# Patient Record
Sex: Male | Born: 1950 | Race: Black or African American | Hispanic: No | Marital: Married | Smoking: Never smoker
Health system: Southern US, Community
[De-identification: ages and names within clinical notes are randomized; demographics above are authoritative.]

## PROBLEM LIST (undated history)

## (undated) DIAGNOSIS — C61 Malignant neoplasm of prostate: Secondary | ICD-10-CM

## (undated) DIAGNOSIS — I10 Essential (primary) hypertension: Secondary | ICD-10-CM

## (undated) DIAGNOSIS — Z923 Personal history of irradiation: Secondary | ICD-10-CM

## (undated) DIAGNOSIS — F419 Anxiety disorder, unspecified: Secondary | ICD-10-CM

## (undated) DIAGNOSIS — N189 Chronic kidney disease, unspecified: Secondary | ICD-10-CM

## (undated) HISTORY — DX: Personal history of irradiation: Z92.3

## (undated) HISTORY — DX: Malignant neoplasm of prostate: C61

## (undated) HISTORY — PX: FRACTURE SURGERY: SHX138

---

## 1999-07-18 ENCOUNTER — Encounter: Admission: RE | Admit: 1999-07-18 | Discharge: 1999-10-16 | Payer: Self-pay | Admitting: Family Medicine

## 2011-11-24 ENCOUNTER — Emergency Department (HOSPITAL_COMMUNITY): Payer: BC Managed Care – PPO

## 2011-11-24 ENCOUNTER — Encounter (HOSPITAL_COMMUNITY): Payer: Self-pay | Admitting: Emergency Medicine

## 2011-11-24 ENCOUNTER — Emergency Department (HOSPITAL_COMMUNITY)
Admission: EM | Admit: 2011-11-24 | Discharge: 2011-11-24 | Disposition: A | Discharge status: Home / Self Care | Payer: BC Managed Care – PPO | Attending: Emergency Medicine | Admitting: Emergency Medicine

## 2011-11-24 DIAGNOSIS — M25579 Pain in unspecified ankle and joints of unspecified foot: Secondary | ICD-10-CM | POA: Insufficient documentation

## 2011-11-24 DIAGNOSIS — M21969 Unspecified acquired deformity of unspecified lower leg: Secondary | ICD-10-CM | POA: Insufficient documentation

## 2011-11-24 DIAGNOSIS — S9000XA Contusion of unspecified ankle, initial encounter: Secondary | ICD-10-CM | POA: Insufficient documentation

## 2011-11-24 DIAGNOSIS — W108XXA Fall (on) (from) other stairs and steps, initial encounter: Secondary | ICD-10-CM | POA: Insufficient documentation

## 2011-11-24 DIAGNOSIS — S82853A Displaced trimalleolar fracture of unspecified lower leg, initial encounter for closed fracture: Secondary | ICD-10-CM | POA: Insufficient documentation

## 2011-11-24 DIAGNOSIS — S82852A Displaced trimalleolar fracture of left lower leg, initial encounter for closed fracture: Secondary | ICD-10-CM

## 2011-11-24 MED ORDER — HYDROCODONE-ACETAMINOPHEN 5-325 MG PO TABS: ORAL_TABLET | ORAL | Status: AC

## 2011-11-24 NOTE — ED Notes (Signed)
Patient refused to sign electronic signature pad before discharge.

## 2011-11-24 NOTE — ED Notes (Signed)
Patient given discharge paperwork; went over discharge instructions with patient.  Instructed patient to follow up with specialist tomorrow at 8am, to follow up with primary care physician, and to return to the ED for new, worsening, or concerning symptoms.  Instructed not to drive with pain medication on board.

## 2011-11-24 NOTE — ED Notes (Signed)
Pt slipped and fell off porch just pta.  C/o pain, swelling, and deformity to L ankle.  Positive DP pulse.

## 2011-11-24 NOTE — Progress Notes (Signed)
Orthopedic Tech Progress Note Patient Details:  William Todd [birth year] [id number]  Other Ortho Devices Type of Ortho Device: Crutches Ortho Device Location: (L) LE Ortho Device Interventions: Casandra Doffing 11/24/2011, 10:34 PM

## 2011-11-24 NOTE — Progress Notes (Signed)
Orthopedic Tech Progress Note Patient Details:  William Todd January 01, 1951 [id number]  Type of Splint: Post (short);Stirrup Splint Location: (L) LE Splint Interventions: Application    [name] 11/24/2011, 10:34 PM

## 2011-11-24 NOTE — ED Notes (Signed)
Received bedside report from [place], [place].  Patient currently sitting up in bed; no respiratory or acute distress noted.  Patient updated on plan of care; informed patient that he is up for discharge and that we are currently waiting on discharge paperwork from EDP.  Patient has no other questions or concerns at this time.  Introduced self to patient and updated whiteboard in room.  Will continue to monitor.

## 2011-11-25 NOTE — ED Provider Notes (Signed)
History     CSN: [id number]  Arrival date & time 11/24/11  1924   First MD Initiated Contact with Patient 11/24/11 2015      Chief Complaint  Patient presents with  . Ankle Pain    HPI Per pt and spouse, c/o sudden onset and persistence of constant left ankle "pain" that began PTA.  Pt states he slipped/tripped on a step and "twisted my ankle."  Denies any other injuries.  Denies hitting head/LOC or AMS since fall.  Denies prodromal symptoms before fall, no syncope, no CP/SOB, no abd pain, no neck or back pain, no head injury, no knee/hip/foot pain.   History reviewed. No pertinent past medical history.  History reviewed. No pertinent past surgical history.   History  Substance Use Topics  . Smoking status: Never Smoker   . Smokeless tobacco: Not on file  . Alcohol Use: Yes     social      Review of Systems ROS: Statement: All systems negative except as marked or noted in the HPI; Constitutional: Negative for fever and chills. ; ; Eyes: Negative for eye pain, redness and discharge. ; ; ENMT: Negative for ear pain, hoarseness, nasal congestion, sinus pressure and sore throat. ; ; Cardiovascular: Negative for chest pain, palpitations, diaphoresis, dyspnea and peripheral edema. ; ; Respiratory: Negative for cough, wheezing and stridor. ; ; Gastrointestinal: Negative for nausea, vomiting, diarrhea, abdominal pain, blood in stool, hematemesis, jaundice and rectal bleeding. . ; ; Genitourinary: Negative for dysuria, flank pain and hematuria. ; ; Musculoskeletal: Negative for back pain and neck pain. +left ankle pain, deformity.; ; Skin: Negative for pruritus, rash, abrasions, blisters, bruising and skin lesion.; ; Neuro: Negative for headache, lightheadedness and neck stiffness. Negative for weakness, altered level of consciousness , altered mental status, extremity weakness, paresthesias, involuntary movement, seizure and syncope.       Allergies  Review of patient's allergies  indicates no known allergies.  Home Medications   Current Outpatient Rx  Name Route Sig Dispense Refill  . HYDROCODONE-ACETAMINOPHEN 5-325 MG PO TABS  1 tab PO q6 hours prn pain 20 tablet 0    BP 105/85  Pulse 86  Temp(Src) 99.1 F (37.3 C) (Oral)  Resp 18  SpO2 99%  Physical Exam Physical examination:  Nursing notes reviewed; Vital signs and O2 SAT reviewed;  Constitutional: Well developed, Well nourished, Well hydrated, In no acute distress; Head:  Normocephalic, atraumatic; Eyes: EOMI, PERRL, No scleral icterus; ENMT: Mouth and pharynx normal, Mucous membranes moist; Neck: Supple, Full range of motion, No lymphadenopathy; Cardiovascular: Regular rate and rhythm, No murmur, rub, or gallop; Respiratory: Breath sounds clear & equal bilaterally, No rales, rhonchi, wheezes, or rub, Normal respiratory effort/excursion; Chest: Nontender, Movement normal; Extremities: Pulses normal, +minimal generalized TTP left ankle with obvious deformity and small area of ecchymosis with 2 very small blisters medial malleolar area, no open wounds. Strong pedal pulse, brisk cap refill in toes.  NT left knee and ankle, no proximal fibular tenderness., No calf edema or asymmetry.; Neuro: AA&Ox3, Major CN grossly intact.  No gross focal motor or sensory deficits in extremities.; Skin: Color normal, Warm, Dry   ED Course  Procedures   MDM  MDM Reviewed: nursing note and vitals Interpretation: x-ray   Dg Ankle 2 Views Left 11/24/2011  *RADIOLOGY REPORT*  Clinical Data: Follow-up splint  LEFT ANKLE - 2 VIEW  Comparison: 11/24/2011 at 1951 hours  Findings: Interval placement of splint material which obscures bony detail.  Again demonstrated are  comminuted displaced fractures of the distal left fibula and transverse displaced fracture of the medial malleolus.  Residual lateral displacement of distal fracture fragments and of the talus with respect to the tibia.  Minimal change in the alignment and position of the  fracture fragments.  IMPRESSION: Medial and lateral malleolar fragments of the left ankle with lateral displacement of fracture fragments and of the talus with respect to the tibia.  Original Report Authenticated By: Marlon Pel, M.D.   Dg Ankle Complete Left 11/24/2011  *RADIOLOGY REPORT*  Clinical Data: Fall.  Left ankle swelling.  LEFT ANKLE COMPLETE - 3+ VIEW  Comparison: None.  Findings: An oblique fracture is present in the distal fibula. There is avulsion fracture of the medial malleolus.  The ankle joint is displaced laterally relative to the fibula.  A posterior tibial fracture is also present.  The proximal foot is unremarkable.  IMPRESSION: Fracture dislocation of the left ankle with an oblique fracture of the distal fibula, avulsion fracture of the medial malleolus, and a minimally-displaced posterior tibial fracture.  Original Report Authenticated By: Jamesetta Orleans. MATTERN, M.D.    2100:  T/C to Ortho Dr. Lestine Box, case discussed, including:  HPI, pertinent PM/SHx, VS/PE, dx testing:  Requests to apply splint to ankle (posterior U) and he will see pt in the office at 0800 to perform local block/resplint/discuss surgery.  Dx testing, as well as d/w Ortho Dr. Lestine Box, d/w pt and wife, questions answered, verb understanding, agreeable to f/u at 0800 in Ortho MD ofc.  Pt states he is agreeable with this plan as he "didn't want" and "wasn't going to" have Ortho admit him to the hospital tonight, and wants to go home now.  Pt has actually been hopping around the ED exam room changing positions from stretcher to chair by himself putting his left foot on the floor while sitting.  Pt encouraged not to weight bear.  Pt does NOT want IV, conscious sedation, or any pain medication stating to "just put the splint on and I want to go home."  Pt and wife aware of XR results, insistent to "just put the splint on."  Pt makes his own medical decisions.  Neurovasc exam of left foot unchanged, medial ecchymosis  and small blisters remain intact.  T/C back to Ortho MD, made aware of this new info above, requests to splint ankle in place and he will see pt in ofc at 0800 and to tell pt not to apply weight to his ankle.  Pt and wife informed of this conversation, verb understanding, and agreeable.        2230:  Splint applied by Milon Dikes, myself and ED RN assist.  Extra padding applied around medial malleolar area/blisters, brisk cap refill continues in toes with visualized pink/warm/dry skin.  T/C to Ortho Dr. Lestine Box: skin changes, extra padding and splint application, pt hopping around the room, pt's refusal of procedural sedation, and XR findings after splint all again discussed.  He does not want me to try to reduce/achieve better anatomical alignment at this time, requests to keep this splint on, reiterate to pt no weight bearing, he will see pt at 0800 in the morning.  Dx testing d/w pt and family.  Questions answered.  Verb understanding, agreeable to d/c home with outpt f/u.       Laray Anger, DO 11/27/11 1306

## 2012-01-23 ENCOUNTER — Encounter (HOSPITAL_BASED_OUTPATIENT_CLINIC_OR_DEPARTMENT_OTHER): Payer: BC Managed Care – PPO | Attending: Internal Medicine

## 2012-01-23 DIAGNOSIS — S81009A Unspecified open wound, unspecified knee, initial encounter: Secondary | ICD-10-CM | POA: Insufficient documentation

## 2012-01-23 DIAGNOSIS — X58XXXA Exposure to other specified factors, initial encounter: Secondary | ICD-10-CM | POA: Insufficient documentation

## 2012-01-23 DIAGNOSIS — S91009A Unspecified open wound, unspecified ankle, initial encounter: Secondary | ICD-10-CM | POA: Insufficient documentation

## 2012-01-23 NOTE — Progress Notes (Signed)
[facility]  NAME:  William Todd, William Todd              ACCOUNT NO.:  [id number]  MEDICAL RECORD NO.:  [id number]      DATE OF BIRTH:  24-Jul-1951  PHYSICIAN:  Maxwell Caul, M.D.      VISIT DATE:                                  OFFICE VISIT   CHIEF COMPLAINT:  Review of wounds on the left bilateral ankle.  Mr. Seago is a 61 year old man kindly referred by Dr. Lestine Box.  The history is that he fell while going down the stairs on February 11.  He suffered a trimalleolar fracture requiring bilateral surgical incisions and open reduction and internal fixation.  His surgery was done on February 15.  He was placed in a cast postoperatively.  We have follow- up notes from February 28.  At which time, it was noted that he had been noncompliant with nonweightbearing based on the condition of his cast. The cast was removed on that date and immediately was noted to have about a half-dollar sized fracture blister.  The patient works as a Production designer, theatre/television/film at [employer].  He has apparently been compliant with his crutches since then and a CAM walker.  His last visit with Dr. Ashok Norris office was on March 21, at which point he was put on Bactrim DS 1 p.o. b.i.d. for 14 days.  I do not see that cultures were done.  There are instructions on the notes that he is on aspirin, although I do not see this on his medication list.  He has been using a Cam walker and crutches.  PAST MEDICAL HISTORY:  Negative other than the question of neuropathy.  REVIEW OF SYSTEMS:  RESPIRATORY:  He does not complain of shortness of breath.  CARDIAC:  No exertional symptoms.  EXTREMITIES:  He does not complain of a lot of pain or change in sensation, weakness, etc.  PHYSICAL EXAMINATION:  VITAL SIGNS:  Temperature is 98.5, pulse 68, respirations 18, blood pressure is elevated at 187/93, apparently it was in the 160s when he was seen in [facility]. RESPIRATORY:  Clear air entry bilaterally. CARDIAC:   Heart sounds are normal.  There are no murmurs, no increase in jugular venous pressure. EXTREMITIES:  Peripheral pulses are robust.  His ABI is calculated at 1.  Wound exam, there are areas of concern on both sides of the left ankle. Laterally over the incision, there is a small but fairly deep area measuring 0.2 x 0.6 x 0.5, this does not probe to bone.  The area over his left medial ankle is a more superficial substantial area measuring 3 x 0.4 x 0.2, this has healthy-looking granulation.  The area around the ankle was swollen, but not overtly infected.  There is probably some stasis physiology in the left lower leg.  As mentioned, his peripheral pulses and circulation seems to be well intact.  IMPRESSIONS:  Bilateral ankle wounds in the setting of a trimalleolar fracture, recent surgery as described above.  To the wounds currently, I would apply a Silver Collagen.  He will need a Kerlix and Coban wrap to control the swelling.  I did not culture either one of these wounds at this area as at this time there was no current evidence of a superficial infection.  Ultimately, I think the  smaller deeper wound would do well with the Oasis and we will see if we can get that approved through his insurance in a man who is already had more than a month's worth of difficulties with these areas.  We will see him again in a week's time.  He will come back after his appointment with Orthopedics for [place] to dress the wounds today.          ______________________________ Maxwell Caul, M.D.     MGR/MEDQ  D:  01/23/2012  T:  01/23/2012  Job:  [id number]

## 2012-01-30 ENCOUNTER — Emergency Department (HOSPITAL_COMMUNITY): Payer: BC Managed Care – PPO

## 2012-01-30 ENCOUNTER — Other Ambulatory Visit: Payer: Self-pay | Admitting: Physician Assistant

## 2012-01-30 ENCOUNTER — Emergency Department (HOSPITAL_COMMUNITY)
Admission: EM | Admit: 2012-01-30 | Discharge: 2012-01-30 | Disposition: A | Discharge status: Home / Self Care | Payer: BC Managed Care – PPO | Attending: Emergency Medicine | Admitting: Emergency Medicine

## 2012-01-30 ENCOUNTER — Encounter (HOSPITAL_COMMUNITY): Payer: Self-pay | Admitting: Emergency Medicine

## 2012-01-30 DIAGNOSIS — Z87828 Personal history of other (healed) physical injury and trauma: Secondary | ICD-10-CM | POA: Insufficient documentation

## 2012-01-30 DIAGNOSIS — Z79899 Other long term (current) drug therapy: Secondary | ICD-10-CM | POA: Insufficient documentation

## 2012-01-30 DIAGNOSIS — Z9889 Other specified postprocedural states: Secondary | ICD-10-CM | POA: Insufficient documentation

## 2012-01-30 DIAGNOSIS — M14679 Charcot's joint, unspecified ankle and foot: Secondary | ICD-10-CM

## 2012-01-30 DIAGNOSIS — Z7982 Long term (current) use of aspirin: Secondary | ICD-10-CM | POA: Insufficient documentation

## 2012-01-30 DIAGNOSIS — A5211 Tabes dorsalis: Secondary | ICD-10-CM | POA: Insufficient documentation

## 2012-01-30 DIAGNOSIS — L089 Local infection of the skin and subcutaneous tissue, unspecified: Secondary | ICD-10-CM

## 2012-01-30 LAB — POCT I-STAT, CHEM 8
Calcium, Ion: 1.16 mmol/L (ref 1.12–1.32)
Creatinine, Ser: 1.4 mg/dL — ABNORMAL HIGH (ref 0.50–1.35)
Glucose, Bld: 163 mg/dL — ABNORMAL HIGH (ref 70–99)
HCT: 29 % — ABNORMAL LOW (ref 39.0–52.0)
Hemoglobin: 9.9 g/dL — ABNORMAL LOW (ref 13.0–17.0)
TCO2: 25 mmol/L (ref 0–100)

## 2012-01-30 LAB — DIFFERENTIAL
Basophils Absolute: 0 10*3/uL (ref 0.0–0.1)
Eosinophils Absolute: 0 10*3/uL (ref 0.0–0.7)
Lymphs Abs: 0.4 10*3/uL — ABNORMAL LOW (ref 0.7–4.0)
Monocytes Absolute: 0.6 10*3/uL (ref 0.1–1.0)
Monocytes Relative: 7 % (ref 3–12)
Neutro Abs: 7.8 10*3/uL — ABNORMAL HIGH (ref 1.7–7.7)

## 2012-01-30 LAB — CBC
Hemoglobin: 9.5 g/dL — ABNORMAL LOW (ref 13.0–17.0)
MCH: 27.1 pg (ref 26.0–34.0)
RBC: 3.5 MIL/uL — ABNORMAL LOW (ref 4.22–5.81)

## 2012-01-30 NOTE — ED Notes (Signed)
Pt wife reports that her husband had a wound  appointment this morning  At 830a to re-evaluate a left ankle wound fracture that happened in February.  The wound specialist noticed redness, swelling, and purulent drainage.  Pt reports falling on fractured ankle Saturday.

## 2012-01-30 NOTE — ED Provider Notes (Signed)
History     CSN: [id number]  Arrival date & time 01/30/12  0908   First MD Initiated Contact with Patient 01/30/12 [phone]      Chief Complaint  Patient presents with  . Wound Infection    (Consider location/radiation/quality/duration/timing/severity/associated sxs/prior treatment) The history is provided by the patient.   patient's been treated at that wound care center for chronic left ankle wounds after a previous trimalleolar fracture in February. Specialists reportedly noticed redness swelling and drainage on the left side. Patient also fell on the ankle onset. He is diabetic and has no sensation down there normally. No fevers. Dr. Lestine Box did the surgery and has been managing the wounds.  Past Medical History  Diagnosis Date  . Diabetes mellitus     Past Surgical History  Procedure Date  . Fracture surgery     No family history on file.  History  Substance Use Topics  . Smoking status: Never Smoker   . Smokeless tobacco: Not on file  . Alcohol Use: Yes     social      Review of Systems  Constitutional: Negative for fever and chills.  Respiratory: Negative for cough.   Cardiovascular: Negative for leg swelling.  Gastrointestinal: Negative for abdominal pain.  Genitourinary: Negative for frequency.  Musculoskeletal: Negative for myalgias.  Skin: Positive for wound.  Neurological: Negative for headaches.    Allergies  Review of patient's allergies indicates no known allergies.  Home Medications   Current Outpatient Rx  Name Route Sig Dispense Refill  . ASPIRIN 325 MG PO TABS Oral Take 325 mg by mouth 2 (two) times daily.    Marland Kitchen GLIMEPIRIDE 4 MG PO TABS Oral Take 4 mg by mouth daily before breakfast.    . LORAZEPAM 0.5 MG PO TABS Oral Take 0.5 mg by mouth 2 (two) times daily as needed. anxiety    . METFORMIN HCL ER (MOD) 500 MG PO TB24 Oral Take 500 mg by mouth daily with breakfast.      BP 144/67  Pulse 92  Temp(Src) 98.7 F (37.1 C) (Oral)  Resp 18   SpO2 100%  Physical Exam  Constitutional: He appears well-developed.  HENT:  Head: Normocephalic.  Cardiovascular: Normal rate.   Pulmonary/Chest: Effort normal.  Musculoskeletal: He exhibits edema.       Left lower leg in Cam Walker. William Todd was removed and showed a chronic healing wound over the medial malleolus. This wound shows good granulation tissue and minimal drainage. Approximately 4 cm wide. There is a smaller wound over the lateral malleolus. There is approximately 1 cm central hole. There is purulent drainage coming out of this. Her Celsius pulse intact. Decreased sensation throughout lower extremity. Some erythema surrounding the left wound.  Skin: Skin is warm.    ED Course  Procedures (including critical care time)  Labs Reviewed  CBC - Abnormal; Notable for the following:    RBC 3.50 (*)    Hemoglobin 9.5 (*)    HCT 28.6 (*)    All other components within normal limits  DIFFERENTIAL - Abnormal; Notable for the following:    Neutrophils Relative 88 (*)    Lymphocytes Relative 5 (*)    Neutro Abs 7.8 (*)    Lymphs Abs 0.4 (*)    All other components within normal limits  SEDIMENTATION RATE - Abnormal; Notable for the following:    Sed Rate 137 (*)    All other components within normal limits  POCT I-STAT, CHEM 8 - Abnormal; Notable for  the following:    Sodium 133 (*)    BUN 24 (*)    Creatinine, Ser 1.40 (*)    Glucose, Bld 163 (*)    Hemoglobin 9.9 (*)    HCT 29.0 (*)    All other components within normal limits   Dg Ankle Complete Left  01/30/2012  *RADIOLOGY REPORT*  Clinical Data: Infection of the left ankle, pain, surgery several months ago  LEFT ANKLE COMPLETE - 3+ VIEW  Comparison: Left ankle films of 11/24/2011  Findings: Screws are in place for fixation of the medial malleolar fracture.  Plate and screw fixation of the distal left fibular fracture is noted.  However, there has been collapse of the talar dome in the interval. There is also poor  definition of the cortical margin of the distal left tibia at the level of the ankle joint, possibly indicating infection.  Multiple small bone fragments are noted particularly overlying the anterior distal tibia at the level of the ankle joint. There is some demineralization of the collapsed talar dome which may imply infection as well.  IMPRESSION:   Some fragmentation and demineralization of the distal left tibia at the level of the ankle joint with interval collapse and partial fragmentation of the talar dome.  Infection is a definite consideration.  Multiple small bone fragments are present around the ankle joint.  Original Report Authenticated By: Juline Patch, M.D.     1. Charcot's joint of ankle       MDM  Patient sent in from wound care with suspicion of infection. His white count is elevated. His x-ray shows some ankle breakdown. I discussed with Dr. Lestine Box, who elects to the patient in the office today. He requested a total contact Bledsoe boot, [name] does not have it in stock. They've arranged outpatient followup for it.        William Todd. Rubin Payor, MD 01/30/12 1221

## 2012-01-30 NOTE — Discharge Instructions (Signed)
Go to Dr. Lestine Box office at 2:30 today

## 2012-01-31 ENCOUNTER — Other Ambulatory Visit: Payer: Self-pay | Admitting: Physician Assistant

## 2012-01-31 ENCOUNTER — Ambulatory Visit (HOSPITAL_COMMUNITY)
Admission: RE | Admit: 2012-01-31 | Discharge: 2012-01-31 | Disposition: A | Discharge status: Home / Self Care | Payer: BC Managed Care – PPO | Source: Ambulatory Visit | Attending: Physician Assistant | Admitting: Physician Assistant

## 2012-01-31 DIAGNOSIS — Z8781 Personal history of (healed) traumatic fracture: Secondary | ICD-10-CM | POA: Insufficient documentation

## 2012-01-31 DIAGNOSIS — Y838 Other surgical procedures as the cause of abnormal reaction of the patient, or of later complication, without mention of misadventure at the time of the procedure: Secondary | ICD-10-CM | POA: Insufficient documentation

## 2012-01-31 DIAGNOSIS — T8140XA Infection following a procedure, unspecified, initial encounter: Secondary | ICD-10-CM | POA: Insufficient documentation

## 2012-01-31 DIAGNOSIS — L089 Local infection of the skin and subcutaneous tissue, unspecified: Secondary | ICD-10-CM

## 2012-01-31 DIAGNOSIS — S70919A Unspecified superficial injury of unspecified hip, initial encounter: Secondary | ICD-10-CM

## 2012-01-31 MED ORDER — VANCOMYCIN HCL IN DEXTROSE 1-5 GM/200ML-% IV SOLN
1000.0000 mg | INTRAVENOUS | Status: AC
  Administered 2012-01-31: 1000 mg via INTRAVENOUS
  Filled 2012-01-31 (×2): qty 200

## 2012-01-31 MED ORDER — HEPARIN SOD (PORK) LOCK FLUSH 100 UNIT/ML IV SOLN
250.0000 [IU] | Freq: Once | INTRAVENOUS | Status: AC
  Administered 2012-01-31: 250 [IU] via INTRAVENOUS
  Filled 2012-01-31 (×2): qty 3

## 2012-01-31 NOTE — Procedures (Signed)
Successful placement of right brachial approach 41 cm single lumen PICC line with tip at the superior caval-atrial junction.  The PICC line is ready for immediate use. 

## 2012-02-20 ENCOUNTER — Encounter (HOSPITAL_BASED_OUTPATIENT_CLINIC_OR_DEPARTMENT_OTHER): Payer: BC Managed Care – PPO | Attending: Internal Medicine

## 2012-02-20 ENCOUNTER — Encounter (HOSPITAL_COMMUNITY)
Admission: RE | Admit: 2012-02-20 | Discharge: 2012-02-20 | Disposition: A | Discharge status: Home / Self Care | Payer: BC Managed Care – PPO | Source: Ambulatory Visit | Attending: Family Medicine | Admitting: Family Medicine

## 2012-02-20 DIAGNOSIS — D649 Anemia, unspecified: Secondary | ICD-10-CM | POA: Insufficient documentation

## 2012-02-20 DIAGNOSIS — S91009A Unspecified open wound, unspecified ankle, initial encounter: Secondary | ICD-10-CM | POA: Insufficient documentation

## 2012-02-20 DIAGNOSIS — X58XXXA Exposure to other specified factors, initial encounter: Secondary | ICD-10-CM | POA: Insufficient documentation

## 2012-02-20 DIAGNOSIS — S81009A Unspecified open wound, unspecified knee, initial encounter: Secondary | ICD-10-CM | POA: Insufficient documentation

## 2012-02-20 LAB — DIFFERENTIAL
Basophils Absolute: 0.1 10*3/uL (ref 0.0–0.1)
Lymphocytes Relative: 4 % — ABNORMAL LOW (ref 12–46)
Lymphs Abs: 0.8 10*3/uL (ref 0.7–4.0)
Monocytes Absolute: 0.5 10*3/uL (ref 0.1–1.0)
Neutro Abs: 22.1 10*3/uL — ABNORMAL HIGH (ref 1.7–7.7)

## 2012-02-20 LAB — CBC
Hemoglobin: 8 g/dL — ABNORMAL LOW (ref 13.0–17.0)
MCHC: 31.7 g/dL (ref 30.0–36.0)
RDW: 14.5 % (ref 11.5–15.5)

## 2012-02-20 LAB — ABO/RH: ABO/RH(D): O POS

## 2012-02-20 LAB — PREPARE RBC (CROSSMATCH)

## 2012-02-21 ENCOUNTER — Encounter (HOSPITAL_COMMUNITY)
Admission: RE | Admit: 2012-02-21 | Discharge: 2012-02-21 | Disposition: A | Discharge status: Home / Self Care | Payer: BC Managed Care – PPO | Source: Ambulatory Visit | Attending: Family Medicine | Admitting: Family Medicine

## 2012-02-21 MED ORDER — FUROSEMIDE 10 MG/ML IJ SOLN
40.0000 mg | Freq: Once | INTRAMUSCULAR | Status: AC
  Administered 2012-02-21: 40 mg via INTRAVENOUS
  Filled 2012-02-21: qty 4

## 2012-02-22 LAB — TYPE AND SCREEN: Unit division: 0

## 2012-03-05 ENCOUNTER — Ambulatory Visit
Admission: RE | Admit: 2012-03-05 | Discharge: 2012-03-05 | Disposition: A | Discharge status: Home / Self Care | Payer: BC Managed Care – PPO | Source: Ambulatory Visit | Attending: Physician Assistant | Admitting: Physician Assistant

## 2012-03-05 ENCOUNTER — Other Ambulatory Visit: Payer: Self-pay | Admitting: Physician Assistant

## 2012-03-05 DIAGNOSIS — R634 Abnormal weight loss: Secondary | ICD-10-CM

## 2013-01-01 ENCOUNTER — Encounter (HOSPITAL_COMMUNITY)
Admission: EM | Disposition: A | Discharge status: Home / Self Care | Payer: Self-pay | Source: Ambulatory Visit | Attending: Cardiology

## 2013-01-01 ENCOUNTER — Observation Stay (HOSPITAL_COMMUNITY)
Admission: EM | Admit: 2013-01-01 | Discharge: 2013-01-03 | Disposition: A | Discharge status: Home / Self Care | Payer: BC Managed Care – PPO | Source: Ambulatory Visit | Attending: Cardiology | Admitting: Cardiology

## 2013-01-01 ENCOUNTER — Other Ambulatory Visit: Payer: Self-pay

## 2013-01-01 ENCOUNTER — Encounter (HOSPITAL_COMMUNITY): Payer: Self-pay | Admitting: General Practice

## 2013-01-01 DIAGNOSIS — I2510 Atherosclerotic heart disease of native coronary artery without angina pectoris: Principal | ICD-10-CM | POA: Insufficient documentation

## 2013-01-01 DIAGNOSIS — R55 Syncope and collapse: Secondary | ICD-10-CM | POA: Insufficient documentation

## 2013-01-01 DIAGNOSIS — I498 Other specified cardiac arrhythmias: Secondary | ICD-10-CM | POA: Insufficient documentation

## 2013-01-01 DIAGNOSIS — E119 Type 2 diabetes mellitus without complications: Secondary | ICD-10-CM | POA: Insufficient documentation

## 2013-01-01 DIAGNOSIS — R112 Nausea with vomiting, unspecified: Secondary | ICD-10-CM | POA: Insufficient documentation

## 2013-01-01 DIAGNOSIS — R61 Generalized hyperhidrosis: Secondary | ICD-10-CM | POA: Insufficient documentation

## 2013-01-01 DIAGNOSIS — R9431 Abnormal electrocardiogram [ECG] [EKG]: Secondary | ICD-10-CM | POA: Insufficient documentation

## 2013-01-01 DIAGNOSIS — R109 Unspecified abdominal pain: Secondary | ICD-10-CM | POA: Insufficient documentation

## 2013-01-01 HISTORY — PX: LEFT HEART CATHETERIZATION WITH CORONARY ANGIOGRAM: SHX5451

## 2013-01-01 HISTORY — PX: CARDIAC CATHETERIZATION: SHX172

## 2013-01-01 HISTORY — DX: Essential (primary) hypertension: I10

## 2013-01-01 LAB — LIPID PANEL
HDL: 50 mg/dL (ref >39)
Triglycerides: 76 mg/dL (ref <150)

## 2013-01-01 LAB — APTT: aPTT: 38 seconds — ABNORMAL HIGH (ref 24–37)

## 2013-01-01 LAB — POCT I-STAT, CHEM 8
BUN: 37 mg/dL — ABNORMAL HIGH (ref 6–23)
Calcium, Ion: 1.25 mmol/L (ref 1.13–1.30)
Creatinine, Ser: 1.3 mg/dL (ref 0.50–1.35)
Hemoglobin: 11.2 g/dL — ABNORMAL LOW (ref 13.0–17.0)
Sodium: 142 mEq/L (ref 135–145)
TCO2: 25 mmol/L (ref 0–100)

## 2013-01-01 LAB — CBC
MCHC: 35.8 g/dL (ref 30.0–36.0)
RDW: 12.7 % (ref 11.5–15.5)

## 2013-01-01 LAB — CBC WITH DIFFERENTIAL/PLATELET
Basophils Absolute: 0 10*3/uL (ref 0.0–0.1)
Basophils Relative: 0 % (ref 0–1)
Eosinophils Relative: 0 % (ref 0–5)
Hemoglobin: 11.8 g/dL — ABNORMAL LOW (ref 13.0–17.0)
MCH: 30.5 pg (ref 26.0–34.0)
MCHC: 35.4 g/dL (ref 30.0–36.0)
MCV: 86 fL (ref 78.0–100.0)
Monocytes Absolute: 0.4 10*3/uL (ref 0.1–1.0)
RBC: 3.87 MIL/uL — ABNORMAL LOW (ref 4.22–5.81)
RDW: 12.5 % (ref 11.5–15.5)
WBC: 13 10*3/uL — ABNORMAL HIGH (ref 4.0–10.5)

## 2013-01-01 LAB — COMPREHENSIVE METABOLIC PANEL
Albumin: 3.4 g/dL — ABNORMAL LOW (ref 3.5–5.2)
Albumin: 3.9 g/dL (ref 3.5–5.2)
BUN: 37 mg/dL — ABNORMAL HIGH (ref 6–23)
BUN: 36 mg/dL — ABNORMAL HIGH (ref 6–23)
Chloride: 103 mEq/L (ref 96–112)
Creatinine, Ser: 1.16 mg/dL (ref 0.50–1.35)
Creatinine, Ser: 1.07 mg/dL (ref 0.50–1.35)
Total Bilirubin: 0.3 mg/dL (ref 0.3–1.2)
Total Protein: 6.3 g/dL (ref 6.0–8.3)
Total Protein: 7.4 g/dL (ref 6.0–8.3)

## 2013-01-01 LAB — GLUCOSE, CAPILLARY
Glucose-Capillary: 135 mg/dL — ABNORMAL HIGH (ref 70–99)
Glucose-Capillary: 143 mg/dL — ABNORMAL HIGH (ref 70–99)

## 2013-01-01 LAB — PROTIME-INR
INR: 1.23 (ref 0.00–1.49)
Prothrombin Time: 42.6 seconds — ABNORMAL HIGH (ref 11.6–15.2)
Prothrombin Time: 15.3 seconds — ABNORMAL HIGH (ref 11.6–15.2)

## 2013-01-01 LAB — TROPONIN I
Troponin I: <0.3 ng/mL (ref <0.30)
Troponin I: <0.3 ng/mL (ref <0.30)

## 2013-01-01 LAB — MAGNESIUM: Magnesium: 2.2 mg/dL (ref 1.5–2.5)

## 2013-01-01 LAB — HEMOGLOBIN A1C: Hgb A1c MFr Bld: 6 % — ABNORMAL HIGH (ref <5.7)

## 2013-01-01 LAB — CK TOTAL AND CKMB (NOT AT ARMC)
CK, MB: 3.6 ng/mL (ref 0.3–4.0)
Total CK: 192 U/L (ref 7–232)

## 2013-01-01 LAB — POCT ACTIVATED CLOTTING TIME: Activated Clotting Time: 318 seconds

## 2013-01-01 SURGERY — LEFT HEART CATHETERIZATION WITH CORONARY ANGIOGRAM
Anesthesia: LOCAL

## 2013-01-01 MED ORDER — ASPIRIN 81 MG PO CHEW: 324.0000 mg | CHEWABLE_TABLET | ORAL | Status: DC

## 2013-01-01 MED ORDER — SODIUM CHLORIDE 0.9 % IV SOLN
INTRAVENOUS | Status: AC
  Administered 2013-01-01: 150 mL/h via INTRAVENOUS

## 2013-01-01 MED ORDER — FENTANYL CITRATE 0.05 MG/ML IJ SOLN
INTRAMUSCULAR | Status: AC
  Filled 2013-01-01: qty 2

## 2013-01-01 MED ORDER — ONDANSETRON HCL 4 MG/2ML IJ SOLN: 4.0000 mg | Freq: Four times a day (QID) | INTRAMUSCULAR | Status: DC | PRN

## 2013-01-01 MED ORDER — ATORVASTATIN CALCIUM 80 MG PO TABS
80.0000 mg | ORAL_TABLET | Freq: Every day | ORAL | Status: DC
  Administered 2013-01-01 – 2013-01-02 (×2): 80 mg via ORAL
  Filled 2013-01-01 (×4): qty 1

## 2013-01-01 MED ORDER — ASPIRIN EC 81 MG PO TBEC
81.0000 mg | DELAYED_RELEASE_TABLET | Freq: Every day | ORAL | Status: DC
  Administered 2013-01-02 – 2013-01-03 (×2): 81 mg via ORAL
  Filled 2013-01-01 (×2): qty 1

## 2013-01-01 MED ORDER — MIDAZOLAM HCL 2 MG/2ML IJ SOLN
INTRAMUSCULAR | Status: AC
  Filled 2013-01-01: qty 2

## 2013-01-01 MED ORDER — SODIUM CHLORIDE 0.9 % IJ SOLN: 3.0000 mL | INTRAMUSCULAR | Status: DC | PRN

## 2013-01-01 MED ORDER — BIVALIRUDIN 250 MG IV SOLR
INTRAVENOUS | Status: AC
  Filled 2013-01-01: qty 250

## 2013-01-01 MED ORDER — ADENOSINE 12 MG/4ML IV SOLN
12.0000 mL | Freq: Once | INTRAVENOUS | Status: DC
  Filled 2013-01-01: qty 12

## 2013-01-01 MED ORDER — GLIMEPIRIDE 4 MG PO TABS
4.0000 mg | ORAL_TABLET | Freq: Every day | ORAL | Status: DC
  Administered 2013-01-02 – 2013-01-03 (×2): 4 mg via ORAL
  Filled 2013-01-01 (×3): qty 1

## 2013-01-01 MED ORDER — NITROGLYCERIN 0.4 MG SL SUBL: 0.4000 mg | SUBLINGUAL_TABLET | SUBLINGUAL | Status: DC | PRN

## 2013-01-01 MED ORDER — HEPARIN (PORCINE) IN NACL 2-0.9 UNIT/ML-% IJ SOLN
INTRAMUSCULAR | Status: AC
  Filled 2013-01-01: qty 1000

## 2013-01-01 MED ORDER — ASPIRIN 300 MG RE SUPP
300.0000 mg | RECTAL | Status: AC
  Filled 2013-01-01: qty 1

## 2013-01-01 MED ORDER — ASPIRIN 81 MG PO CHEW: 324.0000 mg | CHEWABLE_TABLET | ORAL | Status: AC

## 2013-01-01 MED ORDER — ACETAMINOPHEN 325 MG PO TABS: 650.0000 mg | ORAL_TABLET | ORAL | Status: DC | PRN

## 2013-01-01 MED ORDER — LIDOCAINE HCL (PF) 1 % IJ SOLN
INTRAMUSCULAR | Status: AC
  Filled 2013-01-01: qty 30

## 2013-01-01 MED ORDER — SODIUM CHLORIDE 0.9 % IJ SOLN: 3.0000 mL | Freq: Two times a day (BID) | INTRAMUSCULAR | Status: DC

## 2013-01-01 MED ORDER — NITROGLYCERIN 1 MG/10 ML FOR IR/CATH LAB
INTRA_ARTERIAL | Status: AC
  Filled 2013-01-01: qty 10

## 2013-01-01 MED ORDER — LORAZEPAM 0.5 MG PO TABS: 0.5000 mg | ORAL_TABLET | Freq: Two times a day (BID) | ORAL | Status: DC | PRN

## 2013-01-01 MED ORDER — SODIUM CHLORIDE 0.9 % IV SOLN: 250.0000 mL | INTRAVENOUS | Status: DC | PRN

## 2013-01-01 MED ORDER — INSULIN ASPART 100 UNIT/ML ~~LOC~~ SOLN
0.0000 [IU] | Freq: Three times a day (TID) | SUBCUTANEOUS | Status: DC
  Administered 2013-01-01: 1 [IU] via SUBCUTANEOUS
  Administered 2013-01-02: 2 [IU] via SUBCUTANEOUS
  Administered 2013-01-02 (×2): 1 [IU] via SUBCUTANEOUS

## 2013-01-01 NOTE — Progress Notes (Signed)
Site area: right groin  Site Prior to Removal:  Level 0  Pressure Applied For 25 MINUTES    Minutes Beginning at 1555  Manual:   yes  Patient Status During Pull: AAO X4  Post Pull Groin Site:  Level 0  Post Pull Instructions Given:  yes  Post Pull Pulses Present:  yes  Dressing Applied:  yes  Comments:  Sheath pulled by Orvilla Fus RN  Tolerated procedure well

## 2013-01-01 NOTE — CV Procedure (Signed)
Left cardiac cath report dictated on 01/01/2013 dictation number is [phone]

## 2013-01-01 NOTE — H&P (Signed)
William Todd is an 62 y.o. male.   Chief Complaint: Abdominal pain associated with syncopal episode/diaphoresis HPI: Patient is 62 year old male with past medical history significant for recently diagnosed hypertension, non-insulin-dependent diabetes mellitus, history of fracture of left foot in the past, came by EMS directly to the Cath Lab as patient complained of abdominal pain associated with diaphoresis and syncopal episode EKG done on the field showed sinus bradycardia with LVH with ST-T wave changes in anteroseptal leads and minor T wave inversion in lead aVL. Patient denies any palpitations prior to the syncopal episode. Patient denies any chest pain nausea but was diaphoretic and pale prior to passing out. Patient denies any history of exertional chest pain. Denies any PND orthopnea leg swelling. Denies cough fever chills. Denies any cardiac workup in the past. Patient states he passed out approximately 6 months ago and was noted to be diabetic at that time he should do not recall whether he was hypoglycemic at that time.  Past Medical History  Diagnosis Date  . Diabetes mellitus     Past Surgical History  Procedure Laterality Date  . Fracture surgery      No family history on file. Social History:  reports that he has never smoked. He does not have any smokeless tobacco history on file. He reports that  drinks alcohol. He reports that he does not use illicit drugs.  Allergies: No Known Allergies  Medications Prior to Admission  Medication Sig Dispense Refill  . aspirin (BAYER ASPIRIN) 325 MG tablet Take 325 mg by mouth 2 (two) times daily.      Marland Kitchen glimepiride (AMARYL) 4 MG tablet Take 4 mg by mouth daily before breakfast.      . LORazepam (ATIVAN) 0.5 MG tablet Take 0.5 mg by mouth 2 (two) times daily as needed. anxiety      . metFORMIN (GLUMETZA) 500 MG (MOD) 24 hr tablet Take 500 mg by mouth daily with breakfast.        No results found for this or any previous visit (from  the past 48 hour(s)). No results found.  Review of Systems  Constitutional: Negative for fever and chills.  Eyes: Negative for blurred vision, double vision and photophobia.  Respiratory: Negative for cough, hemoptysis and sputum production.   Cardiovascular: Negative for chest pain, palpitations and orthopnea.  Gastrointestinal: Positive for nausea, vomiting and abdominal pain.  Genitourinary: Negative for dysuria and urgency.  Musculoskeletal: Negative for myalgias.  Neurological: Positive for dizziness. Negative for headaches.    Weight 63 kg (138 lb 14.2 oz). Physical Exam  Constitutional: He is oriented to person, place, and time.  HENT:  Head: Normocephalic and atraumatic.  Nose: Nose normal.  Mouth/Throat: No oropharyngeal exudate.  Eyes: Conjunctivae are normal. Pupils are equal, round, and reactive to light. Left eye exhibits no discharge. No scleral icterus.  Neck: Normal range of motion. Neck supple. No JVD present. No tracheal deviation present. No thyromegaly present.  Cardiovascular: Normal rate and regular rhythm.   Murmur (Soft systolic murmur noted no S3 gallop) heard. Respiratory: Effort normal and breath sounds normal. No respiratory distress. He has no wheezes.  GI: Soft. Bowel sounds are normal. He exhibits no distension. There is no tenderness. There is no rebound.  Musculoskeletal: He exhibits no edema and no tenderness.  Neurological: He is alert and oriented to person, place, and time.     Assessment/Plan Abdominal pain associated with diaphoresis/syncope with abnormal EKG rule out coronary insufficiency New-onset hypertension Non-insulin-dependent diabetes mellitus History  of left ankle fracture in the past Plan discussed with patient briefly and Cath Lab regarding emergency left cath possible PTCA stenting its risk and benefits i.e. death MI stroke need for emergency CABG local basilar complications etc. and consented for PCI  [facility]  N 01/01/2013, 1:58 PM

## 2013-01-01 NOTE — Progress Notes (Signed)
Chaplain responded to ED page for Code STEMI for pt. Chaplain located pt's wife in short stay waiting area and updated her on cath lab procedures. Accompanied her to consult room 4 and went to cath lab to check on progress. Accompanied Dr. Sharyn Lull as he updated pt's wife and daughter. Accompanied them to recovery room to see pt. Pt will be going to 6500.

## 2013-01-02 LAB — LIPID PANEL
LDL Cholesterol: 99 mg/dL (ref 0–99)
Total CHOL/HDL Ratio: 3.1 RATIO

## 2013-01-02 LAB — GLUCOSE, CAPILLARY
Glucose-Capillary: 118 mg/dL — ABNORMAL HIGH (ref 70–99)
Glucose-Capillary: 142 mg/dL — ABNORMAL HIGH (ref 70–99)
Glucose-Capillary: 100 mg/dL — ABNORMAL HIGH (ref 70–99)
Glucose-Capillary: 67 mg/dL — ABNORMAL LOW (ref 70–99)

## 2013-01-02 LAB — CBC
MCHC: 35.1 g/dL (ref 30.0–36.0)
Platelets: 121 10*3/uL — ABNORMAL LOW (ref 150–400)
RDW: 12.4 % (ref 11.5–15.5)

## 2013-01-02 LAB — BASIC METABOLIC PANEL
GFR calc Af Amer: 87 mL/min — ABNORMAL LOW (ref >90)
GFR calc non Af Amer: 75 mL/min — ABNORMAL LOW (ref >90)
Potassium: 4.3 mEq/L (ref 3.5–5.1)
Sodium: 139 mEq/L (ref 135–145)

## 2013-01-02 LAB — HEMOGLOBIN A1C
Hgb A1c MFr Bld: 6 % — ABNORMAL HIGH (ref <5.7)
Mean Plasma Glucose: 126 mg/dL — ABNORMAL HIGH (ref <117)

## 2013-01-02 MED ORDER — METOPROLOL SUCCINATE ER 50 MG PO TB24
50.0000 mg | ORAL_TABLET | Freq: Every day | ORAL | Status: DC
Start: 2013-01-02 — End: 2013-01-03
  Administered 2013-01-02 – 2013-01-03 (×2): 50 mg via ORAL
  Filled 2013-01-02 (×3): qty 1

## 2013-01-02 MED ORDER — LISINOPRIL 20 MG PO TABS
20.0000 mg | ORAL_TABLET | Freq: Every day | ORAL | Status: DC
  Administered 2013-01-02 – 2013-01-03 (×2): 20 mg via ORAL
  Filled 2013-01-02 (×2): qty 1

## 2013-01-02 NOTE — Cardiovascular Report (Signed)
[name], [name]              ACCOUNT NO.:  [id number]  MEDICAL RECORD NO.:  [id number]  LOCATION:  6533                         FACILITY:  [facility]  PHYSICIAN:  Eduardo Osier. Sharyn Lull, M.D. DATE OF BIRTH:  [birth year]  DATE OF PROCEDURE:  01/01/2013 DATE OF DISCHARGE:                           CARDIAC CATHETERIZATION   PROCEDURE:  Left cardiac cath with selective left and right coronary angiography, left ventriculography, measurement of fractional flow reserve via right groin using Judkins technique.  INDICATION FOR THE PROCEDURE:  Mr. William Todd is 62 year old male with past medical history significant for recently diagnosed hypertension, non-insulin-dependent diabetes mellitus, history of fracture of the left foot in the past.  He came to the ER by EMS directly to the Cath Lab as Code STEMI was called.  The patient complained of abdominal pain associated with diaphoresis and syncopal episode while at work.  EKG done on the field showed sinus bradycardia with LVH with ST-T wave changes in anteroseptal leads and minor T-wave inversion in lead aVL.  The patient denies any palpitation prior to the syncopal episode.  Denies any chest pain, nausea, but was diaphoretic and pale prior to passing out.  The patient denies any history of exertional chest pain.  Denies any PND, orthopnea, or leg swelling. Denies cough, fever, or chills.  Denies any cardiac workup in the past. The patient states he passed out approximately 6 months ago and was noted to be diaphoretic.  At that time, we do not recall whether he was hypoglycemic at that time.  Due to syncopal episode associated with diaphoresis, multiple risk factors and minor EKG changes, I discussed briefly with the patient regarding emergency left cath, possible PTCA stenting, its risks and benefits, i.e., death, MI, stroke, need for emergency CABG, risk of restenosis, local vascular complications, etc., and consented for PCI.  PROCEDURE:   After obtaining the informed consent, the patient was brought to the Cath Lab and was placed on fluoroscopy table.  Right groin was prepped and draped in usual fashion.  Xylocaine 1% was used for local anesthesia in the right groin.  With the help of thin wall needle, 5-French arterial sheath was placed.  The sheath was aspirated and flushed.  Next, 5-French left Judkins catheter was advanced over the wire under fluoroscopic guidance up to the ascending aorta.  Wire was pulled out, the catheter was aspirated and connected to the Manifold. Catheter was further advanced and engaged into left coronary ostium. Multiple views of the left system were taken.  Next, the catheter was disengaged and was pulled out over the wire and was replaced with 5- [place] right Judkins catheter, which was advanced over the wire under fluoroscopic guidance up to the ascending aorta.  Wire was pulled out, the catheter was aspirated and connected to the Manifold.  Catheter was further advanced and engaged into right coronary ostium.  Multiple views of the right system were obtained.  Next, the catheter was disengaged and was pulled out over the wire and was replaced with 5-French pigtail catheter, which was advanced over the wire under fluoroscopic guidance up to the ascending aorta.  Wire was pulled out, the catheter was aspirated and connected to the Manifold.  Catheter was further advanced across the aortic valve into the LV.  LV pressures were recorded.  Next, left ventriculography was done in 30-degree RAO position.  Post- angiographic pressures were recorded from LV and then pullback pressures were recorded from the aorta.  There was no gradient across aortic valve.  Next, the pigtail catheter was pulled out over the wire, sheaths were aspirated and flushed.  FINDINGS:  LV showed good LV systolic function, EF of 50-55%.  Left main was patent.  LAD has 60-70% proximal stenosis with bifurcation of  the diagonal 2, this lesion appears to be significant in RAO caudal view, but appears to be borderline in cranial view.  Diagonal 1 was very small, which was patent.  Diagonal 2 has 80-90% ostial focal and 70-80% proximal stenosis.  Vessel is small.  Left circumflex has 10-15% ostial stenosis.  OM 1 is large, which has 15-20% proximal stenosis.  OM 2 is patent.  Ramus is very very small.  RCA is small, which is patent.  As LAD lesion appears to be borderline, fractional flow reserve was done after giving adenosine IV 140 mcg/kg/minute for 4 minutes.  FFR value was 0.84 suggestive that this lesion is not physiologically significant. The patient tolerated the procedure well.  Plan is to treat medically and we will check serial enzymes and hopefully discharge the patient on tomorrow.     Eduardo Osier. Sharyn Lull, M.D.     MNH/MEDQ  D:  01/01/2013  T:  01/02/2013  Job:  [id number]

## 2013-01-02 NOTE — Progress Notes (Signed)
  Echocardiogram 2D Echocardiogram has been performed.  Sajid Ruppert FRANCES 01/02/2013, 4:34 PM

## 2013-01-02 NOTE — Progress Notes (Signed)
Subjective:  Patient denies any chest pain or shortness of breath. No further episodes of syncope. No further abdominal pain. No significant arrhythmias on the monitor  Objective:  Vital Signs in the last 24 hours: Temp:  [97.6 F (36.4 C)-99.1 F (37.3 C)] 98.5 F (36.9 C) (03/22 0700) Pulse Rate:  [29-96] 96 (03/22 0700) Resp:  [18-20] 18 (03/22 0700) BP: (131-171)/(66-85) 158/69 mmHg (03/22 0900) SpO2:  [97 %-100 %] 97 % (03/22 0700) Weight:  [63 kg (138 lb 14.2 oz)-64 kg (141 lb 1.5 oz)] 64 kg (141 lb 1.5 oz) (03/22 0030)  Intake/Output from previous day: 03/21 0701 - 03/22 0700 In: 712.5 [P.O.:120; I.V.:592.5] Out: 875 [Urine:875] Intake/Output from this shift: Total I/O In: 240 [P.O.:240] Out: 300 [Urine:300]  Physical Exam: Neck: no adenopathy, no carotid bruit, no JVD and supple, symmetrical, trachea midline Lungs: clear to auscultation bilaterally Heart: regular rate and rhythm, S1, S2 normal, no murmur, click, rub or gallop Abdomen: soft, non-tender; bowel sounds normal; no masses,  no organomegaly Extremities: extremities normal, atraumatic, no cyanosis or edema Right groin stable with no hematoma or bruit Lab Results:  Recent Labs  01/01/13 1601 01/02/13 0331  WBC 13.0* 7.9  HGB 11.8* 10.6*  PLT 130* 121*    Recent Labs  01/01/13 1601 01/02/13 0331  NA 137 139  K 4.8 4.3  CL 103 106  CO2 24 25  GLUCOSE 176* 103*  BUN 36* 27*  CREATININE 1.07 1.04    Recent Labs  01/01/13 2112 01/02/13 0329  TROPONINI <0.30 <0.30   Hepatic Function Panel  Recent Labs  01/01/13 1601  PROT 7.4  ALBUMIN 3.9  AST 20  ALT 22  ALKPHOS 87  BILITOT 0.3    Recent Labs  01/02/13 0331  CHOL 165   No results found for this basename: PROTIME,  in the last 72 hours  Imaging: Imaging results have been reviewed and No results found.  Cardiac Studies:  Assessment/Plan:  Status post abdominal pain/diaphoresis probable angina equivalent status post left  cath/fractional flow reserve to the left anterior descending coronary artery FFR was 0.84 2 vessel coronary artery disease Status post syncope rule out cardiac arrhythmias Rate related left bundle branch block Hypertension Non-insulin-dependent diabetes matters History of ankle fracture in the past Hypercholesteremia Plan Restart her his blood pressure meds Check 2-D echo and duplex carotid ultrasound Increase ambulation Possible discharge tomorrow if stable   LOS: 1 day    Vinetta Brach N 01/02/2013, 10:31 AM

## 2013-01-03 LAB — GLUCOSE, CAPILLARY: Glucose-Capillary: 101 mg/dL — ABNORMAL HIGH (ref 70–99)

## 2013-01-03 MED ORDER — AMLODIPINE BESYLATE 10 MG PO TABS: 5.0000 mg | ORAL_TABLET | Freq: Every day | ORAL | Status: DC

## 2013-01-03 MED ORDER — FAMOTIDINE 20 MG PO TABS: 20.0000 mg | ORAL_TABLET | Freq: Two times a day (BID) | ORAL | Status: DC

## 2013-01-03 MED ORDER — ASPIRIN 81 MG PO TBEC: 81.0000 mg | DELAYED_RELEASE_TABLET | Freq: Every day | ORAL | Status: DC

## 2013-01-03 MED ORDER — METOPROLOL SUCCINATE ER 50 MG PO TB24: 50.0000 mg | ORAL_TABLET | Freq: Every day | ORAL | Status: DC

## 2013-01-03 MED ORDER — CLOPIDOGREL BISULFATE 75 MG PO TABS: 75.0000 mg | ORAL_TABLET | Freq: Every day | ORAL | Status: DC

## 2013-01-03 MED ORDER — NITROGLYCERIN 0.4 MG SL SUBL: 0.4000 mg | SUBLINGUAL_TABLET | SUBLINGUAL | Status: DC | PRN

## 2013-01-03 MED ORDER — ATORVASTATIN CALCIUM 80 MG PO TABS: 40.0000 mg | ORAL_TABLET | Freq: Every day | ORAL | Status: DC

## 2013-01-03 NOTE — Discharge Summary (Signed)
Discharge summary dictated on March 23 dictation [id number]

## 2013-01-03 NOTE — Discharge Summary (Signed)
NAMEOVIDIO, STEELE              ACCOUNT NO.:  [id number]  MEDICAL RECORD NO.:  [id number]  LOCATION:  [id number]                        FACILITY:  [facility]  PHYSICIAN:  Eduardo Osier. Sharyn Lull, M.D. DATE OF BIRTH:  Jul 11, 1951  DATE OF ADMISSION:  01/01/2013 DATE OF DISCHARGE:  01/03/2013                              DISCHARGE SUMMARY   ADMITTING DIAGNOSES: 1. Abdominal pain associated with diaphoresis/syncope with abnormal     EKG, rule out coronary insufficiency. 2. Hypertension. 3. Non-insulin-dependent diabetes mellitus. 4. History of left foot fracture in the past.  DISCHARGE DIAGNOSES: 1. Status post abdominal pain associated with diaphoresis, probably     angina equivalent. 2. Status post left cardiac catheterization, followed by fractional     flow reserve. 3. Two-vessel coronary artery disease, status post syncope. 4. Rate-related bundle-branch block. 5. Hypertension. 6. Hypercholesteremia. 7. Diabetes mellitus. 8. History of left foot/ankle fracture.  DISCHARGE HOME MEDICATIONS: 1. Atorvastatin 40 mg daily. 2. Clopidogrel 75 mg 1 tablet daily. 3. Pepcid 20 mg 1 tablet twice daily. 4. Metoprolol succinate 50 mg 1 tablet daily. 5. Nitrostat 0.4 mg sublingual use as directed. 6. Amlodipine 5 mg daily. 7. Enteric-coated aspirin 81 mg 1 tablet daily. 8. Citalopram 10 mg daily. 9. Amaryl 4 mg daily. 10.Lisinopril 40 mg daily. 11.The patient has been advised to stop hydrochlorothiazide.  DIET:  Low-salt, low-cholesterol 1800 calories ADA diet.  The patient has been advised to monitor blood sugar and blood pressure daily.  CONDITION AT DISCHARGE:  Stable.  BRIEF HISTORY AND HOSPITAL COURSE:  Mr. Grandfield is a 62 year old male with past medical history significant for recently diagnosed hypertension, non-insulin-dependent diabetes mellitus, history of fracture of the left foot in the past.  He came by EMS directly to the cath lab as the patient complained of abdominal pain  associated with diaphoresis and syncopal episode.  EKG done on the field showed sinus bradycardia with LVH with ST-T wave changes in anteroseptal leads __________.  The patient denies any palpitation prior to syncopal episode.  Denies any chest pain, nausea but was diaphoretic and pale prior to passing out.  The patient denies any history of exertional chest pain.  Denies PND, orthopnea, leg swelling.  Denies cough, fever, chills.  Denies any cardiac workup in the past.  The patient states he has passed out approximately 6 months ago and was noted to be diabetic at that time and does not recall whether he was hypoglycemic at that time.  PHYSICAL EXAMINATION:  GENERAL:  He was hemodynamically stable in the cath lab. HEENT:  Normocephalic, atraumatic.  Conjunctivae pink. NECK:  Supple.  No JVD.  No bruit. LUNGS:  Clear to auscultation without rhonchi or rales. CARDIOVASCULAR:  S1, S2 normal.  There was soft systolic murmur.  There was no S3, gallop. ABDOMEN:  Soft.  Bowel sounds were present.  Nontender. EXTREMITIES:  No clubbing, cyanosis, or edema. NEUROLOGIC:  Grossly intact.  LABORATORY DATA:  Sodium was 142, potassium 4.2, BUN was elevated at 37, creatinine was 1.30, glucose was 144.  His four sets of troponin-I were negative.  Cholesterol was 154, LDL 89, HDL 50, triglycerides were 79. Hemoglobin was 11.2, hematocrit 33.  Repeat hemoglobin 10.6, hematocrit 30.2,  white count of 7.9, which has been stable, platelet count was 121,000.  His repeat EKG showed normal sinus rhythm with left ventricular hypertrophy and incomplete left bundle-branch block.  Two-D echo showed normal wall motion with EF of 50-55%.  There was moderate MR and mild TR.  His TSH was normal at 0.74.  BRIEF HOSPITAL COURSE:  The patient directly came to the cath lab and subsequently underwent left cardiac cath with selective left and right coronary angiography and fractional flow reserve as the patient  was noted to have borderline proximal LAD stenosis.  FFR was 0.84, which was felt not to be physiologically significant.  The patient did have critical stenosis in very small diagonal 2, which we opted for medical management.  The patient did not have any episodes of chest pain during the hospital stay.  His groin was stable with no evidence of hematoma or bruit.  The patient did not have any episodes of cardiac arrhythmias during the hospital stay.  The patient has been ambulating in hallway without any problems.  The patient did not have any neurological symptoms during the hospital stay.  The patient will be discharged home on above medications and will be followed up in my office in 1 week. The patient has been extensively counseled regarding lifestyle modification, diet, and exercise.     Eduardo Osier. Sharyn Lull, M.D.     MNH/MEDQ  D:  01/03/2013  T:  01/03/2013  Job:  [id number]

## 2013-01-04 MED FILL — Dextrose Inj 5%: INTRAVENOUS | Qty: 50 | Status: AC

## 2013-06-01 DIAGNOSIS — C61 Malignant neoplasm of prostate: Secondary | ICD-10-CM

## 2013-06-01 HISTORY — PX: PROSTATE BIOPSY: SHX241

## 2013-06-01 HISTORY — DX: Malignant neoplasm of prostate: C61

## 2013-06-07 ENCOUNTER — Other Ambulatory Visit (HOSPITAL_COMMUNITY): Payer: Self-pay | Admitting: Urology

## 2013-06-07 DIAGNOSIS — C61 Malignant neoplasm of prostate: Secondary | ICD-10-CM

## 2013-06-16 ENCOUNTER — Encounter (HOSPITAL_COMMUNITY)
Admission: RE | Admit: 2013-06-16 | Discharge: 2013-06-16 | Disposition: A | Discharge status: Home / Self Care | Payer: BC Managed Care – PPO | Source: Ambulatory Visit | Attending: Urology | Admitting: Urology

## 2013-06-16 DIAGNOSIS — C61 Malignant neoplasm of prostate: Secondary | ICD-10-CM

## 2013-06-16 MED ORDER — TECHNETIUM TC 99M MEDRONATE IV KIT
25.0000 | PACK | Freq: Once | INTRAVENOUS | Status: AC | PRN
  Administered 2013-06-16: 25 via INTRAVENOUS

## 2013-07-12 ENCOUNTER — Encounter: Payer: Self-pay | Admitting: Radiation Oncology

## 2013-07-13 ENCOUNTER — Encounter: Payer: Self-pay | Admitting: Radiation Oncology

## 2013-07-13 NOTE — Progress Notes (Addendum)
GU Location of Tumor / Histology: prostate  If Prostate Cancer, Gleason Score is (3 + 4) and PSA is (24.41 on 07/08/13)  Patient presented 5 months ago with signs/symptoms of: elevated PSA of 27.58, nocturia x 2  Biopsies of prostate (if applicable) revealed: adenocarcinoma w/7 positive cores, Gleason 3+4=7, volume 45.91 cc  Past/Anticipated interventions by urology, if any: none  Past/Anticipated interventions by medical oncology, if any: none  Weight changes, if any: no  Bowel/Bladder complaints, if any: nocturia x 2   Nausea/Vomiting, if any: none  Pain issues, if any:  none  SAFETY ISSUES: no  Prior radiation? no  Pacemaker/ICD? no  Possible current pregnancy? na  Is the patient on methotrexate? no  Current Complaints / other details:  Married,  2 children, interested in brachytherapy , brother  livingage 81 dx age 95 seed implant prostate cancer IPSS score result=5

## 2013-07-15 ENCOUNTER — Encounter: Payer: Self-pay | Admitting: Radiation Oncology

## 2013-07-15 ENCOUNTER — Ambulatory Visit
Admission: RE | Admit: 2013-07-15 | Discharge: 2013-07-15 | Disposition: A | Discharge status: Home / Self Care | Payer: BC Managed Care – PPO | Source: Ambulatory Visit | Attending: Radiation Oncology | Admitting: Radiation Oncology

## 2013-07-15 ENCOUNTER — Telehealth: Payer: Self-pay | Admitting: *Deleted

## 2013-07-15 VITALS — BP 180/100 | HR 92 | Temp 98.7°F | Resp 20 | Ht 73.0 in | Wt 160.2 lb

## 2013-07-15 DIAGNOSIS — C61 Malignant neoplasm of prostate: Secondary | ICD-10-CM

## 2013-07-15 DIAGNOSIS — I10 Essential (primary) hypertension: Secondary | ICD-10-CM | POA: Insufficient documentation

## 2013-07-15 DIAGNOSIS — Z79899 Other long term (current) drug therapy: Secondary | ICD-10-CM | POA: Insufficient documentation

## 2013-07-15 DIAGNOSIS — E119 Type 2 diabetes mellitus without complications: Secondary | ICD-10-CM | POA: Insufficient documentation

## 2013-07-15 HISTORY — DX: Anxiety disorder, unspecified: F41.9

## 2013-07-15 NOTE — Telephone Encounter (Signed)
Called patient to inform of gold seed on 08-27-13- arrival time - 3:15 pm at [facility], and his [facility] Visit on 09-16-13- arrival time - 7:30 am, spoke with patient and he is aware of these appts.

## 2013-07-15 NOTE — Progress Notes (Signed)
Please see the Nurse Progress Note in the MD Initial Consult Encounter for this patient. 

## 2013-07-15 NOTE — Progress Notes (Signed)
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 male who is seen today for the courtesy Dr. 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 His brother was treated by seed implantation for prostate cancer in [facility] [place] 1 year ago. He visited his nurse practitioner, William Todd, in May of this year and was found to have an elevated PSA of 27.58. He was then referred to Dr. Brunilda Todd for further evaluation. He underwent ultrasound-guided biopsies on 05/31/2013. His then have Gleason 7 (3+4) involving 40% of one core from right lateral mid gland, 30% of one core from the right mid gland, 30% of one core from right lateral apex, 40% of one core from the right apex and 20% of one core from the left apex. He Gleason 6 involving less than 5% of one core from the left lateral mid gland and 5% of one core from the left mid gland. His gland volume was 45.9 cc. His staging workup included a CT scan of the abdomen/pelvis on September 18, and a bone scan on September 3 which were without evidence for metastatic disease. He is doing reasonably well from a GU and GI standpoint. His I PSS score is 5. He does have erectile dysfunction.  PREVIOUS RADIATION THERAPY: No   PAST MEDICAL HISTORY:  has a past medical history of Hypertension; Prostate cancer (06/01/13); Anxiety; and Diabetes mellitus.     PAST SURGICAL HISTORY:  Past Surgical History  Procedure Laterality Date  . Fracture surgery      left foot   . Cardiac catheterization  01/01/2013     FAMILY HISTORY: family history  includes Prostate cancer (age of onset: 8) in his brother. His father died of what sounds like pulmonary embolism and 77. His mother died at 54 from cardiac disease and Alzheimer's disease. His mother had a seed implant for prostate cancer at age 74.   SOCIAL HISTORY:  reports that he has never smoked. He has never used smokeless tobacco. He reports that he does not drink alcohol or use illicit drugs. Married, 2 children. He works as a Social research officer, government.   ALLERGIES: Review of patient's allergies indicates no known allergies.   MEDICATIONS:  Current Outpatient Prescriptions  Medication Sig Dispense Refill  . amLODipine (NORVASC) 10 MG tablet Take 0.5 tablets (5 mg total) by mouth daily.  30 tablet  3  . aspirin EC 81 MG EC tablet Take 1 tablet (81 mg total) by mouth daily.  30 tablet  3  . atorvastatin (LIPITOR) 80 MG tablet Take 0.5 tablets (40 mg total) by mouth daily at 6 PM.  3 tablet  30  . citalopram (CELEXA) 10 MG tablet Take 10 mg by mouth daily.      . clopidogrel (PLAVIX) 75 MG tablet Take 1 tablet (75 mg total) by mouth daily.  30 tablet  3  . famotidine (PEPCID) 20 MG tablet Take 1 tablet (20 mg total) by mouth 2 (two) times daily.  60 tablet  3  . glimepiride (AMARYL) 4 MG tablet Take 4 mg  by mouth every evening.      Marland Kitchen lisinopril (PRINIVIL,ZESTRIL) 20 MG tablet Take 20 mg by mouth daily.      . metoprolol succinate (TOPROL-XL) 50 MG 24 hr tablet Take 1 tablet (50 mg total) by mouth daily. Take with or immediately following a meal.  30 tablet  3  . nitroGLYCERIN (NITROSTAT) 0.4 MG SL tablet Place 1 tablet (0.4 mg total) under the tongue every 5 (five) minutes x 3 doses as needed for chest pain.  25 tablet  3   No current facility-administered medications for this encounter.     REVIEW OF SYSTEMS:  Pertinent items are noted in HPI.    PHYSICAL EXAM:  height is 6\' 1"  (1.854 m) and weight is 160 lb 3.2 oz (72.666 kg). His oral temperature is 98.7 F (37.1 C). His blood pressure  is 180/100 and his pulse is 92. His respiration is 20.   Alert, anxious 62 year old African American male appearing his stated age. Head and neck examination: Grossly unremarkable. Nodes: Without palpable cervical or supraclavicular lymphadenopathy. Chest: Lungs clear. Back: Without spinal or CVA tenderness. Heart: Regular rate and rhythm. Abdomen: Without masses organomegaly. Genitalia: Unremarkable to inspection. Rectal: The prostate gland is slightly enlarged and is indurated along lateral aspect of the right lobe. There is no definite periprostatic tumor or seminal vesicle involvement. Extremities: He wears a left lower leg brace (from a remote ankle fracture).   LABORATORY DATA:  Lab Results  Component Value Date   WBC 7.9 01/02/2013   HGB 10.6* 01/02/2013   HCT 30.2* 01/02/2013   MCV 86.5 01/02/2013   PLT 121* 01/02/2013   Lab Results  Component Value Date   NA 139 01/02/2013   K 4.3 01/02/2013   CL 106 01/02/2013   CO2 25 01/02/2013   Lab Results  Component Value Date   ALT 22 01/01/2013   AST 20 01/01/2013   ALKPHOS 87 01/01/2013   BILITOT 0.3 01/01/2013   PSA from 07/08/2013 was 24.41.   IMPRESSION: Stage T2b  high-risk adenocarcinoma prostate. I explained to the patient and his wife that his prognosis is related to his stage, Gleason score, and PSA level. His stage is of intermediate favorability. His Gleason score is also of intermediate favorability while his PSA level of over 20 is unfavorable. He has high-risk disease. We discussed treatment options which include surgery versus radiation therapy. Radiation therapy options include 5 weeks of external beam followed by seed implantation or 8 weeks of external beam/IMRT. I also recommend androgen deprivation therapy for 2 years. This would begin approximately 2-3 months before his external beam treatment. We discussed the potential acute and late toxicities of radiation therapy and spent a good deal of time discussing seed implant boost.  We'll also discussed treating him with a comfortably full bladder to minimize urinary related toxicity. He is not interested in pursuing surgery. I spoke with Dr. Madilyn Hook nurse, Windell Moulding, and she will get him scheduled for initiation of androgen deprivation therapy. We'll also get him scheduled for placement of 3 gold seed markers for image guidance during his external beam portion of his treatment. I'll see him back for a followup visit in approximately 2 months. I expect that he'll start his treatment in early January. Lastly, I instructed his wife to check his blood pressure at home, and she remains elevated, he should see his primary care physician.   PLAN: As discussed above.  I spent 60 minutes minutes face to face with the patient and  more than 50% of that time was spent in counseling and/or coordination of care.

## 2013-07-28 ENCOUNTER — Ambulatory Visit: Payer: BC Managed Care – PPO | Admitting: Radiation Oncology

## 2013-07-28 ENCOUNTER — Ambulatory Visit: Payer: BC Managed Care – PPO

## 2013-08-26 ENCOUNTER — Telehealth: Payer: Self-pay | Admitting: *Deleted

## 2013-08-26 NOTE — Telephone Encounter (Signed)
PATIENT TO HAVE F. MARKERS PLACED ON 09-07-13 - ARRIVAL TIME - 9:45 AM AT DR. Brunilda Payor' OFFICE AND HIS [facility] VISIT ON 09-16-13- ARRIVAL TIME - 7:30 AM, SPOKE WITH PATIENT AND HE IS AWARE OF THESE APPTS.

## 2013-09-14 ENCOUNTER — Encounter: Payer: Self-pay | Admitting: Radiation Oncology

## 2013-09-14 NOTE — Progress Notes (Signed)
GU Location of Tumor / Histology: prostate   If Prostate Cancer, Gleason Score is (3 + 4) and PSA is (24.41 on 07/08/13)   Patient presented 5 months ago with signs/symptoms of: elevated PSA of 27.58, nocturia x 2   Biopsies of prostate (if applicable) revealed: adenocarcinoma w/7 positive cores, Gleason 3+4=7, volume 45.91 cc   Past/Anticipated interventions by urology, if any: none  Past/Anticipated interventions by medical oncology, if any: none  Weight changes, if any: no  Bowel/Bladder complaints, if any: nocturia x 2 , occasional weak strem, stops/starts, some frequency Nausea/Vomiting, if any: none  Pain issues, if any: none   SAFETY ISSUES: no  Prior radiation? no  Pacemaker/ICD? no  Possible current pregnancy? na  Is the patient on methotrexate? No  Current Complaints / other details: Married, 2 children, interested in brachytherapy , brother living age 60 dx age 3 seed implant prostate cancer  IPSS score 4 09/07/13 placement of fiducial markers, Dr Brunilda Payor

## 2013-09-16 ENCOUNTER — Ambulatory Visit
Admission: RE | Admit: 2013-09-16 | Discharge: 2013-09-16 | Disposition: A | Discharge status: Home / Self Care | Payer: 59 | Source: Ambulatory Visit | Attending: Radiation Oncology | Admitting: Radiation Oncology

## 2013-09-16 ENCOUNTER — Encounter: Payer: Self-pay | Admitting: Radiation Oncology

## 2013-09-16 VITALS — BP 194/102 | HR 88 | Temp 98.5°F | Resp 20 | Wt 142.0 lb

## 2013-09-16 DIAGNOSIS — C61 Malignant neoplasm of prostate: Secondary | ICD-10-CM | POA: Insufficient documentation

## 2013-09-16 DIAGNOSIS — Z79899 Other long term (current) drug therapy: Secondary | ICD-10-CM | POA: Insufficient documentation

## 2013-09-16 NOTE — Progress Notes (Signed)
CC: Dr. Su Grand  Followup note:  History: Mr. Hedberg returns today for review and scheduling of his radiation therapy in the management of his clinical stage TIIb high-risk adenocarcinoma prostate. He presented with a PSA of 27.58 with a Gleason score of 7 (3+4). He was started on androgen deprivation therapy in early to mid October, and 3 gold seeds were placed for image guidance on 09/07/2013. He denies having significant hot flashes or fatigue. He is doing good from a GU and GI standpoint. His I PSS score is 4. He remains anxious.  Physical examination: Alert and oriented. Filed Vitals:   09/16/13 0735  BP: 194/102  Pulse: 88  Temp: 98.5 F (36.9 C)  Resp: 20   Rectal examination: He previously noted induration along the lateral aspect of the right lobe is no longer appreciated.  Impression: Satisfactory progress. We again discussed the potential acute and late toxicities of radiation therapy. We discussed the options of 8 weeks of external beam/IMRT or 5 weeks of external beam followed by seed implantation. After lengthy discussion he is not favor of going to the operating room and undergoing general anesthesia for seed implant boost. Therefore, we'll proceed with 8 weeks of external beam/IMRT. We spent some time discussing our desire to have him treated with a comfortably full bladder to minimize urinary toxicity. Consent is signed today.  Plan: We'll have him return next week for simulation/treatment planning. He'll begin his treatment later this month. He'll continue with a total of 2 years of androgen deprivation therapy.  30 minutes was spent face-to-face with the patient, primarily counseling the patient and coordinating his care.

## 2013-09-16 NOTE — Progress Notes (Signed)
Please see the Nurse Progress Note in the MD Initial Consult Encounter for this patient. 

## 2013-09-20 ENCOUNTER — Ambulatory Visit
Admission: RE | Admit: 2013-09-20 | Discharge: 2013-09-20 | Disposition: A | Discharge status: Home / Self Care | Payer: 59 | Source: Ambulatory Visit | Attending: Radiation Oncology | Admitting: Radiation Oncology

## 2013-09-20 DIAGNOSIS — C61 Malignant neoplasm of prostate: Secondary | ICD-10-CM | POA: Insufficient documentation

## 2013-09-20 DIAGNOSIS — R351 Nocturia: Secondary | ICD-10-CM | POA: Insufficient documentation

## 2013-09-20 DIAGNOSIS — Z510 Encounter for antineoplastic radiation therapy: Secondary | ICD-10-CM | POA: Insufficient documentation

## 2013-09-20 DIAGNOSIS — R350 Frequency of micturition: Secondary | ICD-10-CM | POA: Insufficient documentation

## 2013-09-20 DIAGNOSIS — R3915 Urgency of urination: Secondary | ICD-10-CM | POA: Insufficient documentation

## 2013-09-20 NOTE — Progress Notes (Signed)
Complex simulation/treatment planning note: The patient was taken to the CT simulator. A VAC LOC immobilization device was constructed. A red rubber catheter was placed within the rectal vault. He was then catheterized for a urethrogram. His pelvis was scanned. I chose an isocenter within the prostate. The CT data set was sent to the planning system for contouring of his prostate, seminal vesicles, bladder, rectum, rectosigmoid, and CTV 56 (lymph node CTV). For planning purposes the prostate will be expanded by 0.8 cm except for 0.5 cm along the rectum to create PTV 78. Both seminal vesicles, and CTV 56 will be expanded by 0.5 cm and receive 56 Gy in 40 sessions. He is be treated with a company full bladder. I requesting daily cone beam CT.

## 2013-09-24 ENCOUNTER — Encounter: Payer: Self-pay | Admitting: Radiation Oncology

## 2013-09-24 NOTE — Progress Notes (Signed)
IMRT simulation/treatment planning note: The patient completed his IMRT simulation/treatment planning in the management of his carcinoma the prostate. IMRT was chosen to decrease the risk for both acute and late bladder and rectal toxicity compared to 3-D conformal or conventional radiation therapy. Dose volume histograms were obtained for the target structures including the prostate PTV, seminal vesicle PTV and also lymph node PTV. Dose volume histograms were obtained for the avoidance structures including the bladder, rectum, and femoral heads. We met our departmental guidelines. I'm prescribing 7800 cGy 40 sessions to his prostate PTV and 5600 cGy 40 sessions to his seminal vesicle and lymph node PTV. He is be treated with a comfortably full bladder. I requesting daily cone beam CT setting up to his 3 gold seeds for image guidance.

## 2013-09-29 ENCOUNTER — Ambulatory Visit
Admission: RE | Admit: 2013-09-29 | Discharge: 2013-09-29 | Disposition: A | Discharge status: Home / Self Care | Payer: 59 | Source: Ambulatory Visit | Attending: Radiation Oncology | Admitting: Radiation Oncology

## 2013-09-29 DIAGNOSIS — C61 Malignant neoplasm of prostate: Secondary | ICD-10-CM

## 2013-09-29 NOTE — Progress Notes (Signed)
Chart note: The patient began his radiation therapy today for his carcinoma the prostate. He is being treated with 2 VMAT fields with dynamic MLCs corresponding to one set of IMRT treatment devices [phone])

## 2013-09-30 ENCOUNTER — Ambulatory Visit
Admission: RE | Admit: 2013-09-30 | Discharge: 2013-09-30 | Disposition: A | Discharge status: Home / Self Care | Payer: 59 | Source: Ambulatory Visit | Attending: Radiation Oncology | Admitting: Radiation Oncology

## 2013-10-01 ENCOUNTER — Ambulatory Visit
Admission: RE | Admit: 2013-10-01 | Discharge: 2013-10-01 | Disposition: A | Discharge status: Home / Self Care | Payer: 59 | Source: Ambulatory Visit | Attending: Radiation Oncology | Admitting: Radiation Oncology

## 2013-10-01 ENCOUNTER — Ambulatory Visit: Payer: 59

## 2013-10-04 ENCOUNTER — Ambulatory Visit
Admission: RE | Admit: 2013-10-04 | Discharge: 2013-10-04 | Disposition: A | Discharge status: Home / Self Care | Payer: 59 | Source: Ambulatory Visit | Attending: Radiation Oncology | Admitting: Radiation Oncology

## 2013-10-04 VITALS — BP 187/93 | HR 90 | Temp 98.9°F | Wt 167.8 lb

## 2013-10-04 DIAGNOSIS — C61 Malignant neoplasm of prostate: Secondary | ICD-10-CM

## 2013-10-04 NOTE — Progress Notes (Signed)
Weekly Management Note:  Site: Prostate Current Dose:  780  cGy Projected Dose: 7800  cGy  Narrative: The patient is seen today for routine under treatment assessment. CBCT/MVCT images/port films were reviewed. The chart was reviewed.   Bladder filling is satisfactory. No new GU or GI difficulties.  Physical Examination:  Filed Vitals:   10/04/13 0856  BP: 187/93  Pulse: 90  Temp: 98.9 F (37.2 C)  .  Weight: 167 lb 12.8 oz (76.114 kg). No change.  Impression: Tolerating radiation therapy well.  Plan: Continue radiation therapy as planned.

## 2013-10-04 NOTE — Progress Notes (Signed)
Patient here for routine weekly assessment of radiation to prostate.Completed 4 of 40 treatments.Reviewed routine of clinic and side effects of treatment to include frequency and urgency of urination and increase in stools to be pasty to diarrhea as well as fatigue.Patient's blood pressure up but states he always had problem with it increasing when in doctor's office.Given Radiation therapy and You Booklet.

## 2013-10-05 ENCOUNTER — Ambulatory Visit
Admission: RE | Admit: 2013-10-05 | Discharge: 2013-10-05 | Disposition: A | Discharge status: Home / Self Care | Payer: 59 | Source: Ambulatory Visit | Attending: Radiation Oncology | Admitting: Radiation Oncology

## 2013-10-06 ENCOUNTER — Ambulatory Visit
Admission: RE | Admit: 2013-10-06 | Discharge: 2013-10-06 | Disposition: A | Discharge status: Home / Self Care | Payer: 59 | Source: Ambulatory Visit | Attending: Radiation Oncology | Admitting: Radiation Oncology

## 2013-10-08 ENCOUNTER — Ambulatory Visit
Admission: RE | Admit: 2013-10-08 | Discharge: 2013-10-08 | Disposition: A | Discharge status: Home / Self Care | Payer: 59 | Source: Ambulatory Visit | Attending: Radiation Oncology | Admitting: Radiation Oncology

## 2013-10-11 ENCOUNTER — Ambulatory Visit
Admission: RE | Admit: 2013-10-11 | Discharge: 2013-10-11 | Disposition: A | Discharge status: Home / Self Care | Payer: 59 | Source: Ambulatory Visit | Attending: Radiation Oncology | Admitting: Radiation Oncology

## 2013-10-12 ENCOUNTER — Ambulatory Visit
Admission: RE | Admit: 2013-10-12 | Discharge: 2013-10-12 | Disposition: A | Discharge status: Home / Self Care | Payer: 59 | Source: Ambulatory Visit | Attending: Radiation Oncology | Admitting: Radiation Oncology

## 2013-10-12 ENCOUNTER — Encounter: Payer: Self-pay | Admitting: Radiation Oncology

## 2013-10-12 VITALS — BP 192/87 | HR 90 | Temp 98.5°F | Resp 20 | Wt 159.3 lb

## 2013-10-12 DIAGNOSIS — C61 Malignant neoplasm of prostate: Secondary | ICD-10-CM

## 2013-10-12 NOTE — Progress Notes (Signed)
Weekly Management Note Current Dose: 17.55  Gy  Projected Dose: 78 Gy   Narrative:  The patient presents for routine under treatment assessment.  CBCT/MVCT images/Port film x-rays were reviewed.  The chart was checked. Doing well. No complaints.   Physical Findings: Weight: 159 lb 4.8 oz (72.258 kg). Alert and oriented.  Impression:  The patient is tolerating radiation.  Plan:  Continue treatment as planned.

## 2013-10-12 NOTE — Progress Notes (Signed)
Pt states he has "always had white coat syndrome". He denies pain, bladder/bowel issues, loss of appetite, fatigue.

## 2013-10-13 ENCOUNTER — Ambulatory Visit
Admission: RE | Admit: 2013-10-13 | Discharge: 2013-10-13 | Disposition: A | Discharge status: Home / Self Care | Payer: 59 | Source: Ambulatory Visit | Attending: Radiation Oncology | Admitting: Radiation Oncology

## 2013-10-15 ENCOUNTER — Ambulatory Visit
Admission: RE | Admit: 2013-10-15 | Discharge: 2013-10-15 | Disposition: A | Discharge status: Home / Self Care | Payer: 59 | Source: Ambulatory Visit | Attending: Radiation Oncology | Admitting: Radiation Oncology

## 2013-10-18 ENCOUNTER — Ambulatory Visit
Admission: RE | Admit: 2013-10-18 | Discharge: 2013-10-18 | Disposition: A | Discharge status: Home / Self Care | Payer: 59 | Source: Ambulatory Visit | Attending: Radiation Oncology | Admitting: Radiation Oncology

## 2013-10-18 VITALS — BP 187/96 | HR 90 | Temp 98.1°F | Wt 159.4 lb

## 2013-10-18 DIAGNOSIS — C61 Malignant neoplasm of prostate: Secondary | ICD-10-CM

## 2013-10-18 NOTE — Progress Notes (Signed)
Weekly Management Note:  Site: Prostate Current Dose:  2340  cGy Projected Dose: 7800  cGy  Narrative: The patient is seen today for routine under treatment assessment. CBCT/MVCT images/port films were reviewed. The chart was reviewed.   Bladder filling is satisfactory. No GU or GI difficulties.  Physical Examination:  Filed Vitals:   10/18/13 0839  BP: 187/96  Pulse: 90  Temp: 98.1 F (36.7 C)  .  Weight: 159 lb 6.4 oz (72.303 kg). No change.  Impression: Tolerating radiation therapy well. I encouraged him to fill his bladder slightly more.  Plan: Continue radiation therapy as planned.

## 2013-10-18 NOTE — Progress Notes (Signed)
Patient for weekly assessment of radiation to pelvis for prostate cancer.Urinary frequency. No bowel changes.Completed 12 of 40 treatments.

## 2013-10-19 ENCOUNTER — Ambulatory Visit
Admission: RE | Admit: 2013-10-19 | Discharge: 2013-10-19 | Disposition: A | Discharge status: Home / Self Care | Payer: 59 | Source: Ambulatory Visit | Attending: Radiation Oncology | Admitting: Radiation Oncology

## 2013-10-20 ENCOUNTER — Ambulatory Visit
Admission: RE | Admit: 2013-10-20 | Discharge: 2013-10-20 | Disposition: A | Discharge status: Home / Self Care | Payer: 59 | Source: Ambulatory Visit | Attending: Radiation Oncology | Admitting: Radiation Oncology

## 2013-10-21 ENCOUNTER — Ambulatory Visit
Admission: RE | Admit: 2013-10-21 | Discharge: 2013-10-21 | Disposition: A | Discharge status: Home / Self Care | Payer: 59 | Source: Ambulatory Visit | Attending: Radiation Oncology | Admitting: Radiation Oncology

## 2013-10-22 ENCOUNTER — Ambulatory Visit
Admission: RE | Admit: 2013-10-22 | Discharge: 2013-10-22 | Disposition: A | Discharge status: Home / Self Care | Payer: 59 | Source: Ambulatory Visit | Attending: Radiation Oncology | Admitting: Radiation Oncology

## 2013-10-25 ENCOUNTER — Ambulatory Visit
Admission: RE | Admit: 2013-10-25 | Discharge: 2013-10-25 | Disposition: A | Discharge status: Home / Self Care | Payer: 59 | Source: Ambulatory Visit | Attending: Radiation Oncology | Admitting: Radiation Oncology

## 2013-10-25 ENCOUNTER — Ambulatory Visit: Payer: 59 | Admitting: Radiation Oncology

## 2013-10-26 ENCOUNTER — Ambulatory Visit
Admission: RE | Admit: 2013-10-26 | Discharge: 2013-10-26 | Disposition: A | Discharge status: Home / Self Care | Payer: 59 | Source: Ambulatory Visit | Attending: Radiation Oncology | Admitting: Radiation Oncology

## 2013-10-26 VITALS — BP 168/93 | HR 91 | Temp 98.7°F | Wt 162.6 lb

## 2013-10-26 DIAGNOSIS — C61 Malignant neoplasm of prostate: Secondary | ICD-10-CM

## 2013-10-26 NOTE — Progress Notes (Signed)
Weekly assessment of radiation to prostate.Denies pain or fatigue.Urinary frequency no urgency.Nocturia  1 to 2 times.Bowels regular.

## 2013-10-26 NOTE — Progress Notes (Signed)
Weekly Management Note:  Site: Prostate Current Dose:  3510  cGy Projected Dose: 7800  cGy  Narrative: The patient is seen today for routine under treatment assessment. CBCT/MVCT images/port films were reviewed. The chart was reviewed.   Bladder filling is satisfactory. No significant GU or GI difficulties. He does have nocturia x2-3.  Physical Examination:  Filed Vitals:   10/26/13 0837  BP: 168/93  Pulse: 91  Temp: 98.7 F (37.1 C)  .  Weight: 162 lb 9.6 oz (73.755 kg). No change.   Impression: Tolerating radiation therapy well.  Plan: Continue radiation therapy as planned.

## 2013-10-27 ENCOUNTER — Ambulatory Visit
Admission: RE | Admit: 2013-10-27 | Discharge: 2013-10-27 | Disposition: A | Discharge status: Home / Self Care | Payer: 59 | Source: Ambulatory Visit | Attending: Radiation Oncology | Admitting: Radiation Oncology

## 2013-10-28 ENCOUNTER — Ambulatory Visit
Admission: RE | Admit: 2013-10-28 | Discharge: 2013-10-28 | Disposition: A | Discharge status: Home / Self Care | Payer: 59 | Source: Ambulatory Visit | Attending: Radiation Oncology | Admitting: Radiation Oncology

## 2013-10-29 ENCOUNTER — Ambulatory Visit
Admission: RE | Admit: 2013-10-29 | Discharge: 2013-10-29 | Disposition: A | Discharge status: Home / Self Care | Payer: 59 | Source: Ambulatory Visit | Attending: Radiation Oncology | Admitting: Radiation Oncology

## 2013-11-01 ENCOUNTER — Ambulatory Visit
Admission: RE | Admit: 2013-11-01 | Discharge: 2013-11-01 | Disposition: A | Discharge status: Home / Self Care | Payer: 59 | Source: Ambulatory Visit | Attending: Radiation Oncology | Admitting: Radiation Oncology

## 2013-11-01 ENCOUNTER — Encounter: Payer: Self-pay | Admitting: Radiation Oncology

## 2013-11-01 VITALS — BP 163/82 | HR 79 | Temp 97.7°F | Resp 20 | Wt 159.7 lb

## 2013-11-01 DIAGNOSIS — C61 Malignant neoplasm of prostate: Secondary | ICD-10-CM

## 2013-11-01 NOTE — Progress Notes (Signed)
Pt denies pain, urinary or bowel issues or changes, fatigue, loss of appetite.

## 2013-11-01 NOTE — Progress Notes (Signed)
Weekly Management Note:  Site: Prostate Current Dose:  4290  cGy Projected Dose: 7800  cGy  Narrative: The patient is seen today for routine under treatment assessment. CBCT/MVCT images/port films were reviewed. The chart was reviewed.   Bladder filling is less than ideal today. He does report urinary urgency and frequency, otherwise he is doing well from a GU and GI standpoint.  Physical Examination:  Filed Vitals:   11/01/13 0836  BP: 163/82  Pulse: 79  Temp: 97.7 F (36.5 C)  Resp: 20  .  Weight: 159 lb 11.2 oz (72.439 kg). No change.  Impression: Tolerating radiation therapy well. I encouraged him to improve his bladder filling.  Plan: Continue radiation therapy as planned.

## 2013-11-02 ENCOUNTER — Ambulatory Visit
Admission: RE | Admit: 2013-11-02 | Discharge: 2013-11-02 | Disposition: A | Discharge status: Home / Self Care | Payer: 59 | Source: Ambulatory Visit | Attending: Radiation Oncology | Admitting: Radiation Oncology

## 2013-11-03 ENCOUNTER — Ambulatory Visit
Admission: RE | Admit: 2013-11-03 | Discharge: 2013-11-03 | Disposition: A | Discharge status: Home / Self Care | Payer: 59 | Source: Ambulatory Visit | Attending: Radiation Oncology | Admitting: Radiation Oncology

## 2013-11-04 ENCOUNTER — Ambulatory Visit
Admission: RE | Admit: 2013-11-04 | Discharge: 2013-11-04 | Disposition: A | Discharge status: Home / Self Care | Payer: 59 | Source: Ambulatory Visit | Attending: Radiation Oncology | Admitting: Radiation Oncology

## 2013-11-05 ENCOUNTER — Ambulatory Visit
Admission: RE | Admit: 2013-11-05 | Discharge: 2013-11-05 | Disposition: A | Discharge status: Home / Self Care | Payer: 59 | Source: Ambulatory Visit | Attending: Radiation Oncology | Admitting: Radiation Oncology

## 2013-11-08 ENCOUNTER — Ambulatory Visit
Admission: RE | Admit: 2013-11-08 | Discharge: 2013-11-08 | Disposition: A | Discharge status: Home / Self Care | Payer: 59 | Source: Ambulatory Visit | Attending: Radiation Oncology | Admitting: Radiation Oncology

## 2013-11-08 ENCOUNTER — Encounter: Payer: Self-pay | Admitting: Radiation Oncology

## 2013-11-08 VITALS — BP 179/84 | HR 81 | Temp 98.3°F | Resp 20 | Wt 163.8 lb

## 2013-11-08 DIAGNOSIS — C61 Malignant neoplasm of prostate: Secondary | ICD-10-CM

## 2013-11-08 NOTE — Progress Notes (Signed)
Pt denies pain, fatigue, loss of appetite, bowel issues. He continues to have urinary frequency and urgency.

## 2013-11-08 NOTE — Progress Notes (Signed)
Weekly Management Note:  Site: Prostate Current Dose:  5265  cGy Projected Dose: 7800  cGy  Narrative: The patient is seen today for routine under treatment assessment. CBCT/MVCT images/port films were reviewed. The chart was reviewed.   Bladder filling is satisfactory. No new GU or GI difficulty.  Physical Examination:  Filed Vitals:   11/08/13 0819  BP: 179/84  Pulse: 81  Temp: 98.3 F (36.8 C)  Resp: 20  .  Weight: 163 lb 12.8 oz (74.299 kg). No change.  Impression: Tolerating radiation therapy well.  Plan: Continue radiation therapy as planned.

## 2013-11-09 ENCOUNTER — Ambulatory Visit
Admission: RE | Admit: 2013-11-09 | Discharge: 2013-11-09 | Disposition: A | Discharge status: Home / Self Care | Payer: 59 | Source: Ambulatory Visit | Attending: Radiation Oncology | Admitting: Radiation Oncology

## 2013-11-10 ENCOUNTER — Ambulatory Visit
Admission: RE | Admit: 2013-11-10 | Discharge: 2013-11-10 | Disposition: A | Discharge status: Home / Self Care | Payer: 59 | Source: Ambulatory Visit | Attending: Radiation Oncology | Admitting: Radiation Oncology

## 2013-11-11 ENCOUNTER — Ambulatory Visit
Admission: RE | Admit: 2013-11-11 | Discharge: 2013-11-11 | Disposition: A | Discharge status: Home / Self Care | Payer: 59 | Source: Ambulatory Visit | Attending: Radiation Oncology | Admitting: Radiation Oncology

## 2013-11-12 ENCOUNTER — Ambulatory Visit
Admission: RE | Admit: 2013-11-12 | Discharge: 2013-11-12 | Disposition: A | Discharge status: Home / Self Care | Payer: 59 | Source: Ambulatory Visit | Attending: Radiation Oncology | Admitting: Radiation Oncology

## 2013-11-15 ENCOUNTER — Ambulatory Visit
Admission: RE | Admit: 2013-11-15 | Discharge: 2013-11-15 | Disposition: A | Discharge status: Home / Self Care | Payer: 59 | Source: Ambulatory Visit | Attending: Radiation Oncology | Admitting: Radiation Oncology

## 2013-11-15 ENCOUNTER — Encounter: Payer: Self-pay | Admitting: Radiation Oncology

## 2013-11-15 VITALS — BP 158/87 | HR 84 | Temp 98.8°F | Resp 20 | Wt 163.3 lb

## 2013-11-15 DIAGNOSIS — C61 Malignant neoplasm of prostate: Secondary | ICD-10-CM

## 2013-11-15 NOTE — Progress Notes (Signed)
Pt reports urinary frequency, occasional soft stools and slight fatigue at times. He denies pain or other issues.

## 2013-11-15 NOTE — Progress Notes (Signed)
Weekly Management Note:  Site: Prostate Current Dose:  6240  cGy Projected Dose: 7800  cGy  Narrative: The patient is seen today for routine under treatment assessment. CBCT/MVCT images/port films were reviewed. The chart was reviewed.   Bladder filling satisfactory. He is having slight increase in urinary frequency but is otherwise doing well from a GU and GI standpoint.  Physical Examination:  Filed Vitals:   11/15/13 0849  BP: 158/87  Pulse: 84  Temp: 98.8 F (37.1 C)  Resp: 20  .  Weight: 163 lb 4.8 oz (74.072 kg). No change .  Impression: Tolerating radiation therapy well.  Plan: Continue radiation therapy as planned.

## 2013-11-16 ENCOUNTER — Ambulatory Visit
Admission: RE | Admit: 2013-11-16 | Discharge: 2013-11-16 | Disposition: A | Discharge status: Home / Self Care | Payer: 59 | Source: Ambulatory Visit | Attending: Radiation Oncology | Admitting: Radiation Oncology

## 2013-11-17 ENCOUNTER — Ambulatory Visit
Admission: RE | Admit: 2013-11-17 | Discharge: 2013-11-17 | Disposition: A | Discharge status: Home / Self Care | Payer: 59 | Source: Ambulatory Visit | Attending: Radiation Oncology | Admitting: Radiation Oncology

## 2013-11-18 ENCOUNTER — Ambulatory Visit
Admission: RE | Admit: 2013-11-18 | Discharge: 2013-11-18 | Disposition: A | Discharge status: Home / Self Care | Payer: 59 | Source: Ambulatory Visit | Attending: Radiation Oncology | Admitting: Radiation Oncology

## 2013-11-19 ENCOUNTER — Ambulatory Visit
Admission: RE | Admit: 2013-11-19 | Discharge: 2013-11-19 | Disposition: A | Discharge status: Home / Self Care | Payer: 59 | Source: Ambulatory Visit | Attending: Radiation Oncology | Admitting: Radiation Oncology

## 2013-11-22 ENCOUNTER — Ambulatory Visit
Admission: RE | Admit: 2013-11-22 | Discharge: 2013-11-22 | Disposition: A | Discharge status: Home / Self Care | Payer: 59 | Source: Ambulatory Visit | Attending: Radiation Oncology | Admitting: Radiation Oncology

## 2013-11-22 ENCOUNTER — Encounter: Payer: Self-pay | Admitting: Radiation Oncology

## 2013-11-22 VITALS — BP 185/91 | HR 81 | Temp 98.2°F | Resp 20 | Wt 164.1 lb

## 2013-11-22 DIAGNOSIS — C61 Malignant neoplasm of prostate: Secondary | ICD-10-CM

## 2013-11-22 NOTE — Progress Notes (Signed)
Pt continues to have nocturia x 2- 3. He denies frequency, dysuria, urgency, diarrhea, fatigue, loss of appetite. Pt will complete Thurs; gave him FU card.

## 2013-11-22 NOTE — Progress Notes (Signed)
Weekly Management Note:  Site: Prostate Current Dose:  7215  cGy Projected Dose: 7800  cGy  Narrative: The patient is seen today for routine under treatment assessment. CBCT/MVCT images/port films were reviewed. The chart was reviewed.   Bladder filling is satisfactory but not as good as it has been in the past. No new GU or GI difficulties. He'll finish his radiation therapy this Thursday.  Physical Examination:  Filed Vitals:   11/22/13 0835  BP: 185/91  Pulse: 81  Temp: 98.2 F (36.8 C)  Resp: 20  .  Weight: 164 lb 1.6 oz (74.435 kg). No change .  Impression: Tolerating radiation therapy well.  Plan: Continue radiation therapy as planned.

## 2013-11-23 ENCOUNTER — Ambulatory Visit
Admission: RE | Admit: 2013-11-23 | Discharge: 2013-11-23 | Disposition: A | Discharge status: Home / Self Care | Payer: 59 | Source: Ambulatory Visit | Attending: Radiation Oncology | Admitting: Radiation Oncology

## 2013-11-24 ENCOUNTER — Ambulatory Visit
Admission: RE | Admit: 2013-11-24 | Discharge: 2013-11-24 | Disposition: A | Discharge status: Home / Self Care | Payer: 59 | Source: Ambulatory Visit | Attending: Radiation Oncology | Admitting: Radiation Oncology

## 2013-11-25 ENCOUNTER — Ambulatory Visit
Admission: RE | Admit: 2013-11-25 | Discharge: 2013-11-25 | Disposition: A | Discharge status: Home / Self Care | Payer: 59 | Source: Ambulatory Visit | Attending: Radiation Oncology | Admitting: Radiation Oncology

## 2013-11-25 ENCOUNTER — Encounter: Payer: Self-pay | Admitting: Radiation Oncology

## 2013-11-25 NOTE — Progress Notes (Signed)
[facility] End of Treatment Note  Name:William Todd  Date: 11/25/2013 VCB:[id number] DOB:02-01-1951   Status:outpatient    CC: Selina Cooley FNP, Dr. Lowella Bandy  REFERRING PHYSICIAN:  Dr. 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                          PLAN: Routine followup in one month. Patient instructed to call if questions or worsening complaints in interim.

## 2013-12-15 ENCOUNTER — Encounter: Payer: Self-pay | Admitting: *Deleted

## 2013-12-23 ENCOUNTER — Ambulatory Visit
Admission: RE | Admit: 2013-12-23 | Discharge: 2013-12-23 | Disposition: A | Discharge status: Home / Self Care | Payer: 59 | Source: Ambulatory Visit | Attending: Radiation Oncology | Admitting: Radiation Oncology

## 2013-12-23 VITALS — BP 192/109 | HR 95 | Temp 98.4°F | Wt 164.6 lb

## 2013-12-23 DIAGNOSIS — C61 Malignant neoplasm of prostate: Secondary | ICD-10-CM

## 2013-12-23 NOTE — Progress Notes (Signed)
CC: Dr. 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 He is without GU or GI difficulties. He does have nocturia x2-3 which is improved compared to his pre-radiation habits. No GI difficulty.  Physical examination: Alert and oriented. Filed Vitals:   12/23/13 1258  BP: 192/109  Pulse: 95  Temp: 98.4 F (36.9 C)   He's not examined today.  Impression: Satisfactory progress. He'll see Dr. Janice Norrie for a followup visit in continuation of his androgen deprivation therapy he sees him this June. He sees his primary care physician for adjustment of his hypertensive meds.  Plan: As discussed above.

## 2013-12-23 NOTE — Progress Notes (Signed)
Patient here for routine one month follow up completion of radiation to prostate on 11/25/2013.Denies pain.No urinary frequency, urgency or burning.Nocturia 2 to 3 times.Has regular bowel movements every 2 days.

## 2014-09-22 ENCOUNTER — Encounter (HOSPITAL_COMMUNITY): Payer: Self-pay | Admitting: Cardiology

## 2014-12-15 ENCOUNTER — Encounter (INDEPENDENT_AMBULATORY_CARE_PROVIDER_SITE_OTHER): Payer: 59 | Admitting: Ophthalmology

## 2014-12-15 DIAGNOSIS — H35373 Puckering of macula, bilateral: Secondary | ICD-10-CM | POA: Diagnosis not present

## 2014-12-15 DIAGNOSIS — I10 Essential (primary) hypertension: Secondary | ICD-10-CM

## 2014-12-15 DIAGNOSIS — H2513 Age-related nuclear cataract, bilateral: Secondary | ICD-10-CM | POA: Diagnosis not present

## 2014-12-15 DIAGNOSIS — H43813 Vitreous degeneration, bilateral: Secondary | ICD-10-CM | POA: Diagnosis not present

## 2014-12-15 DIAGNOSIS — E11311 Type 2 diabetes mellitus with unspecified diabetic retinopathy with macular edema: Secondary | ICD-10-CM | POA: Diagnosis not present

## 2014-12-15 DIAGNOSIS — E11351 Type 2 diabetes mellitus with proliferative diabetic retinopathy with macular edema: Secondary | ICD-10-CM | POA: Diagnosis not present

## 2014-12-15 DIAGNOSIS — H35033 Hypertensive retinopathy, bilateral: Secondary | ICD-10-CM

## 2014-12-22 ENCOUNTER — Encounter (INDEPENDENT_AMBULATORY_CARE_PROVIDER_SITE_OTHER): Payer: 59 | Admitting: Ophthalmology

## 2014-12-22 DIAGNOSIS — E11351 Type 2 diabetes mellitus with proliferative diabetic retinopathy with macular edema: Secondary | ICD-10-CM

## 2014-12-22 DIAGNOSIS — E11311 Type 2 diabetes mellitus with unspecified diabetic retinopathy with macular edema: Secondary | ICD-10-CM

## 2015-01-19 ENCOUNTER — Encounter (INDEPENDENT_AMBULATORY_CARE_PROVIDER_SITE_OTHER): Payer: 59 | Admitting: Ophthalmology

## 2015-01-19 DIAGNOSIS — E11311 Type 2 diabetes mellitus with unspecified diabetic retinopathy with macular edema: Secondary | ICD-10-CM | POA: Diagnosis not present

## 2015-01-19 DIAGNOSIS — H35033 Hypertensive retinopathy, bilateral: Secondary | ICD-10-CM | POA: Diagnosis not present

## 2015-01-19 DIAGNOSIS — E11351 Type 2 diabetes mellitus with proliferative diabetic retinopathy with macular edema: Secondary | ICD-10-CM | POA: Diagnosis not present

## 2015-01-19 DIAGNOSIS — H43813 Vitreous degeneration, bilateral: Secondary | ICD-10-CM | POA: Diagnosis not present

## 2015-01-19 DIAGNOSIS — H35373 Puckering of macula, bilateral: Secondary | ICD-10-CM

## 2015-01-19 DIAGNOSIS — I10 Essential (primary) hypertension: Secondary | ICD-10-CM

## 2015-02-15 ENCOUNTER — Encounter (INDEPENDENT_AMBULATORY_CARE_PROVIDER_SITE_OTHER): Payer: Medicare HMO | Admitting: Ophthalmology

## 2015-02-15 DIAGNOSIS — E11351 Type 2 diabetes mellitus with proliferative diabetic retinopathy with macular edema: Secondary | ICD-10-CM | POA: Diagnosis not present

## 2015-02-15 DIAGNOSIS — H35033 Hypertensive retinopathy, bilateral: Secondary | ICD-10-CM | POA: Diagnosis not present

## 2015-02-15 DIAGNOSIS — I10 Essential (primary) hypertension: Secondary | ICD-10-CM | POA: Diagnosis not present

## 2015-02-15 DIAGNOSIS — H35372 Puckering of macula, left eye: Secondary | ICD-10-CM

## 2015-02-15 DIAGNOSIS — E11311 Type 2 diabetes mellitus with unspecified diabetic retinopathy with macular edema: Secondary | ICD-10-CM

## 2015-02-15 DIAGNOSIS — H43813 Vitreous degeneration, bilateral: Secondary | ICD-10-CM | POA: Diagnosis not present

## 2015-03-02 ENCOUNTER — Ambulatory Visit (INDEPENDENT_AMBULATORY_CARE_PROVIDER_SITE_OTHER): Payer: Medicare HMO | Admitting: Ophthalmology

## 2015-03-02 DIAGNOSIS — E11351 Type 2 diabetes mellitus with proliferative diabetic retinopathy with macular edema: Secondary | ICD-10-CM | POA: Diagnosis not present

## 2015-03-02 DIAGNOSIS — E11311 Type 2 diabetes mellitus with unspecified diabetic retinopathy with macular edema: Secondary | ICD-10-CM | POA: Diagnosis not present

## 2015-03-16 ENCOUNTER — Encounter (INDEPENDENT_AMBULATORY_CARE_PROVIDER_SITE_OTHER): Payer: Medicare HMO | Admitting: Ophthalmology

## 2015-03-22 ENCOUNTER — Encounter (INDEPENDENT_AMBULATORY_CARE_PROVIDER_SITE_OTHER): Payer: Medicare HMO | Admitting: Ophthalmology

## 2015-03-22 DIAGNOSIS — E11351 Type 2 diabetes mellitus with proliferative diabetic retinopathy with macular edema: Secondary | ICD-10-CM

## 2015-03-22 DIAGNOSIS — I10 Essential (primary) hypertension: Secondary | ICD-10-CM

## 2015-03-22 DIAGNOSIS — H35033 Hypertensive retinopathy, bilateral: Secondary | ICD-10-CM

## 2015-03-22 DIAGNOSIS — E11311 Type 2 diabetes mellitus with unspecified diabetic retinopathy with macular edema: Secondary | ICD-10-CM

## 2015-03-22 DIAGNOSIS — H2513 Age-related nuclear cataract, bilateral: Secondary | ICD-10-CM

## 2015-03-22 DIAGNOSIS — H43813 Vitreous degeneration, bilateral: Secondary | ICD-10-CM

## 2015-03-23 ENCOUNTER — Encounter (INDEPENDENT_AMBULATORY_CARE_PROVIDER_SITE_OTHER): Payer: Medicare HMO | Admitting: Ophthalmology

## 2015-04-20 ENCOUNTER — Encounter (INDEPENDENT_AMBULATORY_CARE_PROVIDER_SITE_OTHER): Payer: Medicare HMO | Admitting: Ophthalmology

## 2015-04-20 DIAGNOSIS — H35373 Puckering of macula, bilateral: Secondary | ICD-10-CM | POA: Diagnosis not present

## 2015-04-20 DIAGNOSIS — I10 Essential (primary) hypertension: Secondary | ICD-10-CM | POA: Diagnosis not present

## 2015-04-20 DIAGNOSIS — E11351 Type 2 diabetes mellitus with proliferative diabetic retinopathy with macular edema: Secondary | ICD-10-CM | POA: Diagnosis not present

## 2015-04-20 DIAGNOSIS — H35033 Hypertensive retinopathy, bilateral: Secondary | ICD-10-CM

## 2015-04-20 DIAGNOSIS — E11311 Type 2 diabetes mellitus with unspecified diabetic retinopathy with macular edema: Secondary | ICD-10-CM

## 2015-04-20 DIAGNOSIS — H43813 Vitreous degeneration, bilateral: Secondary | ICD-10-CM

## 2015-05-15 ENCOUNTER — Other Ambulatory Visit: Payer: Self-pay | Admitting: Orthopedic Surgery

## 2015-05-15 DIAGNOSIS — M25562 Pain in left knee: Secondary | ICD-10-CM

## 2015-05-18 ENCOUNTER — Ambulatory Visit
Admission: RE | Admit: 2015-05-18 | Discharge: 2015-05-18 | Disposition: A | Discharge status: Home / Self Care | Payer: Medicare HMO | Source: Ambulatory Visit | Attending: Orthopedic Surgery | Admitting: Orthopedic Surgery

## 2015-05-18 DIAGNOSIS — M25562 Pain in left knee: Secondary | ICD-10-CM

## 2015-06-01 ENCOUNTER — Encounter (INDEPENDENT_AMBULATORY_CARE_PROVIDER_SITE_OTHER): Payer: Medicare HMO | Admitting: Ophthalmology

## 2016-06-10 IMAGING — CT CT KNEE*L* W/O CM
3 series · 8 of 14 positions shown, 9 images · non-contrast
Comparison: None.

CLINICAL DATA: Left knee pain for 2 weeks after falling. History of
prostate cancer. Evaluate tibial plateau fracture. Initial
encounter.

EXAM:
CT OF THE LEFT KNEE WITHOUT CONTRAST
TECHNIQUE: Multidetector CT imaging of the left knee was performed according to
the standard protocol. Multiplanar CT image reconstructions were
also generated.

[Series 5: knee soft · axial · 0.30mm/px · z∈[-118,+77]mm · 4 of 131 slices shown, 5 images]
[im 27/131  soft-tissue]
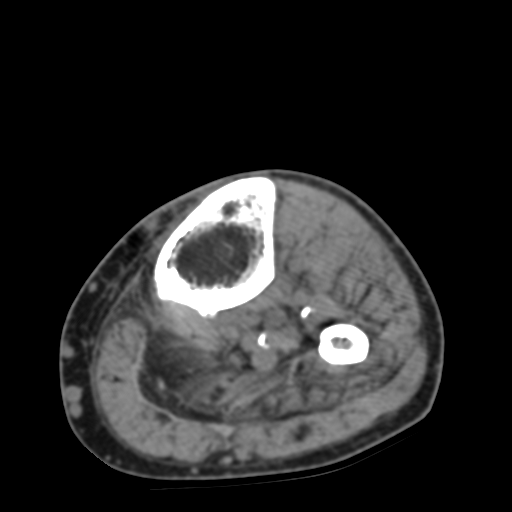
[im 27/131  bone]
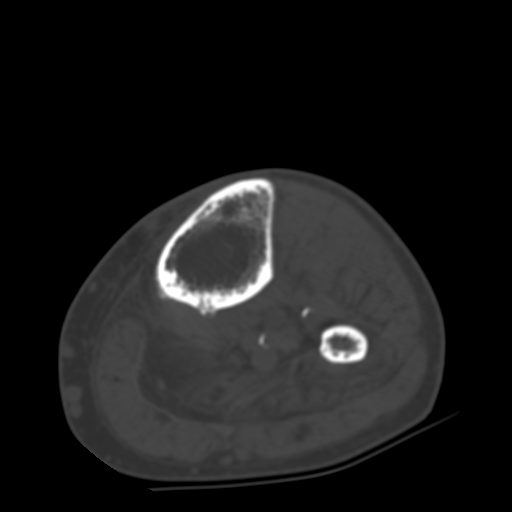
[im 53/131  bone]
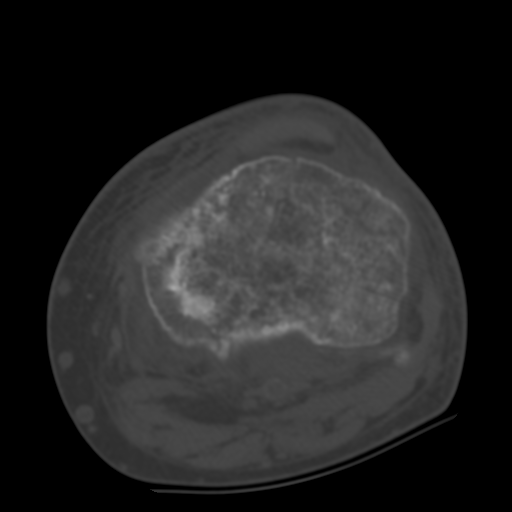
[im 79/131  bone]
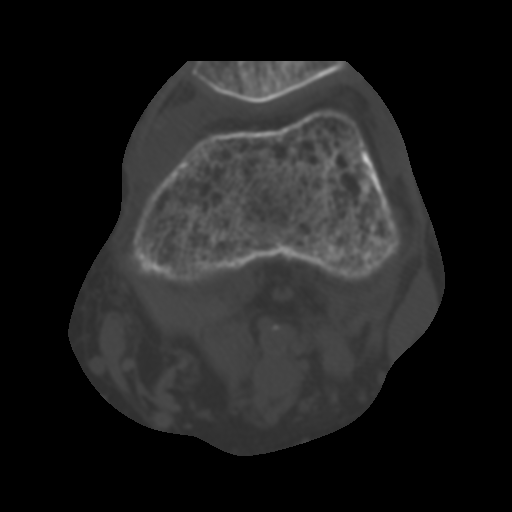
[im 105/131  bone]
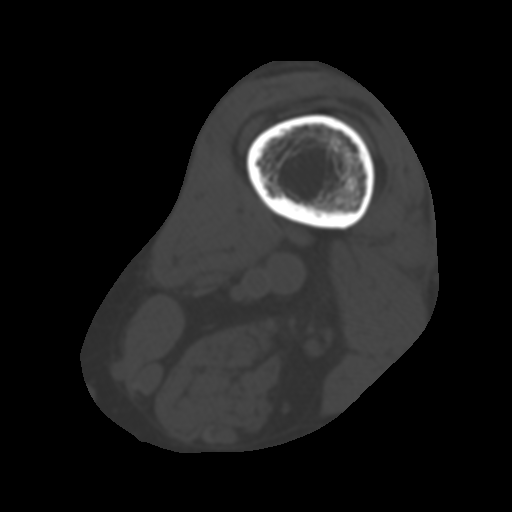

[Series 300: sag soft · sagittal · 0.65mm/px · 2 of 70 slices shown]
[im 24/70  soft-tissue]
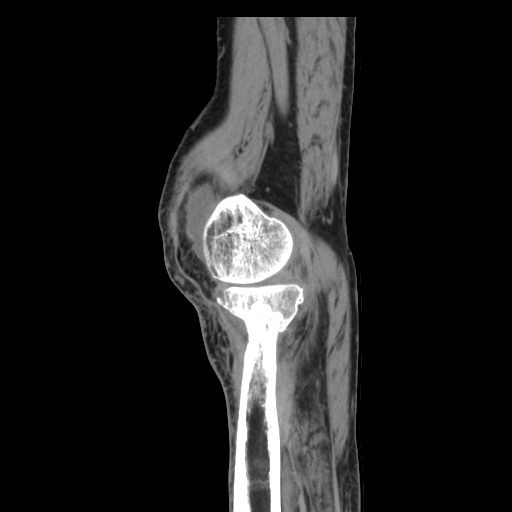
[im 47/70  soft-tissue]
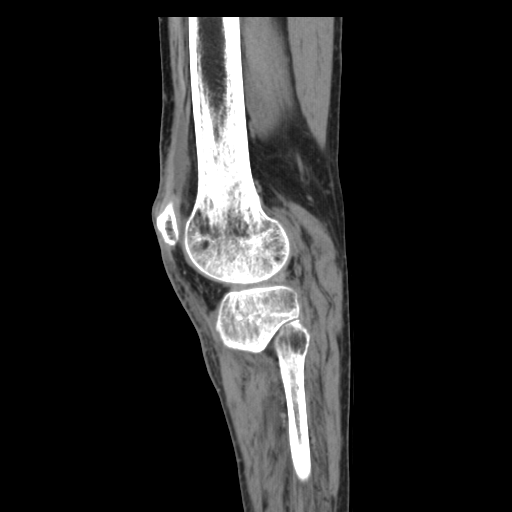

[Series 301: cor soft · coronal · 0.65mm/px · 2 of 72 slices shown]
[im 24/72  soft-tissue]
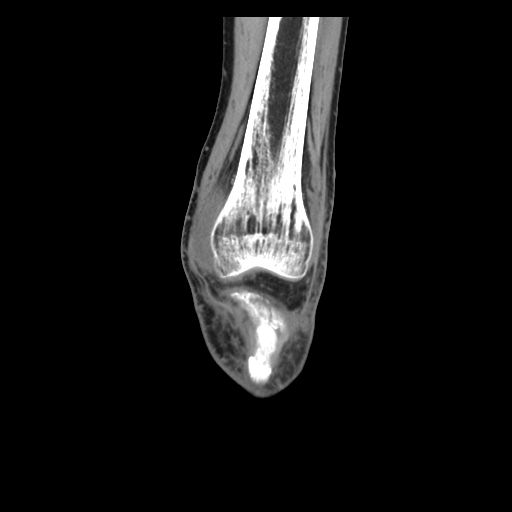
[im 48/72  soft-tissue]
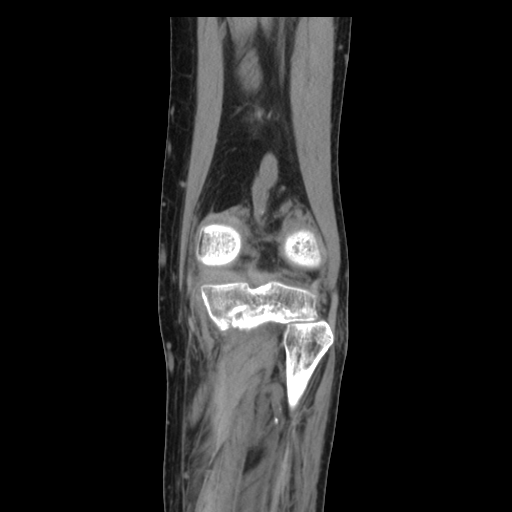

[8 of 14 positions shown; findings below may reference images not displayed]

FINDINGS: The bones are demineralized. There is a mildly impacted nearly
transverse fracture through the proximal tibial metaphysis. There is
mild associated displacement of the cortex medially and posteriorly.
No definite extension of the fracture to the tibial plateaus or
intercondylar eminences is demonstrated.

The distal femur, proximal fibula and patella appear intact.

There is a moderate hemarthrosis without layering intra-articular
fat. The extensor mechanism is intact. As evaluated by CT, the
cruciate ligaments appear intact. Mild atherosclerosis of the
popliteal artery and soleus muscular atrophy noted.
IMPRESSION: 1. Mildly impacted extra-articular fracture of the proximal tibial
metaphysis. No definite involvement of the articular surfaces.
2. Moderate hemarthrosis without layering intra-articular fat.
3. Intact distal femur and proximal fibula.

## 2023-01-06 ENCOUNTER — Ambulatory Visit: Payer: Medicare HMO | Admitting: Podiatry

## 2023-11-15 ENCOUNTER — Other Ambulatory Visit: Payer: Self-pay

## 2023-11-15 ENCOUNTER — Inpatient Hospital Stay (HOSPITAL_COMMUNITY): Payer: Medicare HMO

## 2023-11-15 ENCOUNTER — Inpatient Hospital Stay (HOSPITAL_COMMUNITY)
Admission: EM | Admit: 2023-11-15 | Discharge: 2023-11-20 | DRG: 521 | Disposition: A | Discharge status: Skilled Nursing Facility | Payer: Medicare HMO | Attending: Student | Admitting: Student

## 2023-11-15 ENCOUNTER — Emergency Department (HOSPITAL_COMMUNITY): Payer: Medicare HMO

## 2023-11-15 ENCOUNTER — Encounter (HOSPITAL_COMMUNITY): Payer: Self-pay

## 2023-11-15 DIAGNOSIS — Z7982 Long term (current) use of aspirin: Secondary | ICD-10-CM | POA: Diagnosis not present

## 2023-11-15 DIAGNOSIS — Z7984 Long term (current) use of oral hypoglycemic drugs: Secondary | ICD-10-CM | POA: Diagnosis not present

## 2023-11-15 DIAGNOSIS — I10 Essential (primary) hypertension: Secondary | ICD-10-CM | POA: Diagnosis present

## 2023-11-15 DIAGNOSIS — D649 Anemia, unspecified: Secondary | ICD-10-CM | POA: Diagnosis not present

## 2023-11-15 DIAGNOSIS — E1165 Type 2 diabetes mellitus with hyperglycemia: Secondary | ICD-10-CM | POA: Diagnosis present

## 2023-11-15 DIAGNOSIS — W1830XA Fall on same level, unspecified, initial encounter: Secondary | ICD-10-CM | POA: Diagnosis present

## 2023-11-15 DIAGNOSIS — N1832 Chronic kidney disease, stage 3b: Secondary | ICD-10-CM | POA: Diagnosis present

## 2023-11-15 DIAGNOSIS — Y9301 Activity, walking, marching and hiking: Secondary | ICD-10-CM | POA: Diagnosis present

## 2023-11-15 DIAGNOSIS — Z681 Body mass index (BMI) 19 or less, adult: Secondary | ICD-10-CM | POA: Diagnosis not present

## 2023-11-15 DIAGNOSIS — D638 Anemia in other chronic diseases classified elsewhere: Secondary | ICD-10-CM | POA: Diagnosis present

## 2023-11-15 DIAGNOSIS — R413 Other amnesia: Secondary | ICD-10-CM | POA: Insufficient documentation

## 2023-11-15 DIAGNOSIS — D62 Acute posthemorrhagic anemia: Secondary | ICD-10-CM | POA: Diagnosis not present

## 2023-11-15 DIAGNOSIS — Y92009 Unspecified place in unspecified non-institutional (private) residence as the place of occurrence of the external cause: Secondary | ICD-10-CM

## 2023-11-15 DIAGNOSIS — S7292XA Unspecified fracture of left femur, initial encounter for closed fracture: Secondary | ICD-10-CM | POA: Diagnosis present

## 2023-11-15 DIAGNOSIS — H5010 Unspecified exotropia: Secondary | ICD-10-CM | POA: Diagnosis present

## 2023-11-15 DIAGNOSIS — H5702 Anisocoria: Secondary | ICD-10-CM | POA: Diagnosis present

## 2023-11-15 DIAGNOSIS — F05 Delirium due to known physiological condition: Secondary | ICD-10-CM | POA: Diagnosis not present

## 2023-11-15 DIAGNOSIS — Z8042 Family history of malignant neoplasm of prostate: Secondary | ICD-10-CM

## 2023-11-15 DIAGNOSIS — S72002A Fracture of unspecified part of neck of left femur, initial encounter for closed fracture: Secondary | ICD-10-CM | POA: Diagnosis present

## 2023-11-15 DIAGNOSIS — F0393 Unspecified dementia, unspecified severity, with mood disturbance: Secondary | ICD-10-CM | POA: Diagnosis present

## 2023-11-15 DIAGNOSIS — E119 Type 2 diabetes mellitus without complications: Secondary | ICD-10-CM

## 2023-11-15 DIAGNOSIS — I878 Other specified disorders of veins: Secondary | ICD-10-CM | POA: Diagnosis present

## 2023-11-15 DIAGNOSIS — F03918 Unspecified dementia, unspecified severity, with other behavioral disturbance: Secondary | ICD-10-CM | POA: Diagnosis present

## 2023-11-15 DIAGNOSIS — E440 Moderate protein-calorie malnutrition: Secondary | ICD-10-CM | POA: Diagnosis present

## 2023-11-15 DIAGNOSIS — F419 Anxiety disorder, unspecified: Secondary | ICD-10-CM | POA: Diagnosis present

## 2023-11-15 DIAGNOSIS — Z8546 Personal history of malignant neoplasm of prostate: Secondary | ICD-10-CM

## 2023-11-15 DIAGNOSIS — M810 Age-related osteoporosis without current pathological fracture: Secondary | ICD-10-CM | POA: Diagnosis present

## 2023-11-15 DIAGNOSIS — Z7902 Long term (current) use of antithrombotics/antiplatelets: Secondary | ICD-10-CM

## 2023-11-15 DIAGNOSIS — R930 Abnormal findings on diagnostic imaging of skull and head, not elsewhere classified: Secondary | ICD-10-CM | POA: Insufficient documentation

## 2023-11-15 DIAGNOSIS — E785 Hyperlipidemia, unspecified: Secondary | ICD-10-CM | POA: Diagnosis present

## 2023-11-15 DIAGNOSIS — G9341 Metabolic encephalopathy: Secondary | ICD-10-CM | POA: Diagnosis present

## 2023-11-15 DIAGNOSIS — Z923 Personal history of irradiation: Secondary | ICD-10-CM | POA: Diagnosis not present

## 2023-11-15 DIAGNOSIS — S72002D Fracture of unspecified part of neck of left femur, subsequent encounter for closed fracture with routine healing: Secondary | ICD-10-CM | POA: Diagnosis not present

## 2023-11-15 DIAGNOSIS — I2510 Atherosclerotic heart disease of native coronary artery without angina pectoris: Secondary | ICD-10-CM | POA: Diagnosis present

## 2023-11-15 DIAGNOSIS — Z79899 Other long term (current) drug therapy: Secondary | ICD-10-CM

## 2023-11-15 DIAGNOSIS — E1169 Type 2 diabetes mellitus with other specified complication: Secondary | ICD-10-CM | POA: Diagnosis not present

## 2023-11-15 DIAGNOSIS — Z7401 Bed confinement status: Secondary | ICD-10-CM

## 2023-11-15 DIAGNOSIS — N179 Acute kidney failure, unspecified: Secondary | ICD-10-CM | POA: Diagnosis present

## 2023-11-15 HISTORY — DX: Chronic kidney disease, unspecified: N18.9

## 2023-11-15 LAB — CBC WITH DIFFERENTIAL/PLATELET
Abs Immature Granulocytes: 0.17 10*3/uL — ABNORMAL HIGH (ref 0.00–0.07)
Basophils Absolute: 0.1 10*3/uL (ref 0.0–0.1)
Basophils Relative: 1 %
Eosinophils Absolute: 0.1 10*3/uL (ref 0.0–0.5)
Eosinophils Relative: 1 %
HCT: 30.7 % — ABNORMAL LOW (ref 39.0–52.0)
Hemoglobin: 10.1 g/dL — ABNORMAL LOW (ref 13.0–17.0)
Immature Granulocytes: 1 %
Lymphocytes Relative: 5 %
Lymphs Abs: 0.7 10*3/uL (ref 0.7–4.0)
MCH: 30.4 pg (ref 26.0–34.0)
MCHC: 32.9 g/dL (ref 30.0–36.0)
MCV: 92.5 fL (ref 80.0–100.0)
Monocytes Absolute: 0.5 10*3/uL (ref 0.1–1.0)
Monocytes Relative: 4 %
Neutro Abs: 11.1 10*3/uL — ABNORMAL HIGH (ref 1.7–7.7)
Neutrophils Relative %: 88 %
Platelets: 245 10*3/uL (ref 150–400)
RBC: 3.32 MIL/uL — ABNORMAL LOW (ref 4.22–5.81)
RDW: 12.2 % (ref 11.5–15.5)
WBC: 12.6 10*3/uL — ABNORMAL HIGH (ref 4.0–10.5)
nRBC: 0 % (ref 0.0–0.2)

## 2023-11-15 LAB — CBG MONITORING, ED: Glucose-Capillary: 271 mg/dL — ABNORMAL HIGH (ref 70–99)

## 2023-11-15 LAB — COMPREHENSIVE METABOLIC PANEL
ALT: 44 U/L (ref 0–44)
AST: 45 U/L — ABNORMAL HIGH (ref 15–41)
Albumin: 2.9 g/dL — ABNORMAL LOW (ref 3.5–5.0)
Alkaline Phosphatase: 83 U/L (ref 38–126)
Anion gap: 15 (ref 5–15)
BUN: 45 mg/dL — ABNORMAL HIGH (ref 8–23)
CO2: 22 mmol/L (ref 22–32)
Calcium: 9.4 mg/dL (ref 8.9–10.3)
Chloride: 100 mmol/L (ref 98–111)
Creatinine, Ser: 2.49 mg/dL — ABNORMAL HIGH (ref 0.61–1.24)
GFR, Estimated: 27 mL/min — ABNORMAL LOW (ref >60)
Glucose, Bld: 323 mg/dL — ABNORMAL HIGH (ref 70–99)
Potassium: 4.5 mmol/L (ref 3.5–5.1)
Sodium: 137 mmol/L (ref 135–145)
Total Bilirubin: 1 mg/dL (ref 0.0–1.2)
Total Protein: 7 g/dL (ref 6.5–8.1)

## 2023-11-15 LAB — TROPONIN I (HIGH SENSITIVITY)
Troponin I (High Sensitivity): 6 ng/L (ref <18)
Troponin I (High Sensitivity): 7 ng/L (ref <18)

## 2023-11-15 LAB — CK: Total CK: 193 U/L (ref 49–397)

## 2023-11-15 MED ORDER — SODIUM CHLORIDE 0.9% FLUSH
3.0000 mL | Freq: Two times a day (BID) | INTRAVENOUS | Status: DC
  Administered 2023-11-15 – 2023-11-20 (×10): 3 mL via INTRAVENOUS

## 2023-11-15 MED ORDER — VITAMIN D 25 MCG (1000 UNIT) PO TABS
1000.0000 [IU] | ORAL_TABLET | Freq: Every day | ORAL | Status: DC
  Administered 2023-11-17 – 2023-11-20 (×4): 1000 [IU] via ORAL
  Filled 2023-11-15 (×4): qty 1

## 2023-11-15 MED ORDER — SODIUM CHLORIDE 0.9 % IV BOLUS
1000.0000 mL | Freq: Once | INTRAVENOUS | Status: AC
  Administered 2023-11-15: 1000 mL via INTRAVENOUS

## 2023-11-15 MED ORDER — ACETAMINOPHEN 500 MG PO TABS: 1000.0000 mg | ORAL_TABLET | Freq: Four times a day (QID) | ORAL | Status: DC | PRN

## 2023-11-15 MED ORDER — OXYCODONE HCL 5 MG PO TABS: 5.0000 mg | ORAL_TABLET | Freq: Four times a day (QID) | ORAL | Status: DC | PRN

## 2023-11-15 MED ORDER — CALCIUM CARBONATE 1250 (500 CA) MG PO TABS
1.0000 | ORAL_TABLET | Freq: Two times a day (BID) | ORAL | Status: DC
  Administered 2023-11-16 – 2023-11-20 (×8): 1250 mg via ORAL
  Filled 2023-11-15 (×8): qty 1

## 2023-11-15 MED ORDER — ATORVASTATIN CALCIUM 40 MG PO TABS
40.0000 mg | ORAL_TABLET | Freq: Every day | ORAL | Status: DC
  Administered 2023-11-16 – 2023-11-19 (×4): 40 mg via ORAL
  Filled 2023-11-15 (×4): qty 1

## 2023-11-15 MED ORDER — HEPARIN SODIUM (PORCINE) 5000 UNIT/ML IJ SOLN
5000.0000 [IU] | Freq: Three times a day (TID) | INTRAMUSCULAR | Status: AC
  Administered 2023-11-15: 5000 [IU] via SUBCUTANEOUS
  Filled 2023-11-15: qty 1

## 2023-11-15 MED ORDER — OXYCODONE HCL 5 MG PO TABS: 2.5000 mg | ORAL_TABLET | Freq: Four times a day (QID) | ORAL | Status: DC | PRN

## 2023-11-15 MED ORDER — HYDROMORPHONE HCL 1 MG/ML IJ SOLN: 0.5000 mg | INTRAMUSCULAR | Status: DC | PRN

## 2023-11-15 NOTE — ED Provider Triage Note (Signed)
Emergency Medicine Provider Triage Evaluation Note  William Todd , a 73 y.o. male  was evaluated in triage.  Pt complains of fall, weakness. Patient states that 2 weeks ago he was walking in his house and got lightheaded and tried to lean against the wall and missed the wall and fell on his left hip. He hit his head or lose consciousness.  Since then he has been unable to walk and has been bedbound.  Prior to the fall he could walk unassisted.  Patient lives with his daughter has been caring for him.  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  He denies any headaches, neck pain, back pain, vision changes, chest pain, shortness of breath, nausea, vomiting, diarrhea.  No abdominal pain.  No fevers or chills.  No cough or congestion.  No urinary symptoms.  States he has been eating and drinking adequately.  Review of Systems  Positive:  Negative:   Physical Exam  BP (!) 101/55 (BP Location: Right Arm)   Pulse 80   Temp 98.3 F (36.8 C)   Resp 18   SpO2 100%  Gen:   Awake, no distress   Resp:  Normal effort  MSK:   Moves extremities without difficulty  Other:  Left hip with TTP noted, no external rotation or shortening.  DP PT pulses intact and 2+.  Chronic left ankle deformity which is reportedly unchanged from baseline.  No tenderness.  Medical Decision Making  Medically screening exam initiated at 4:42 PM.  Appropriate orders placed.  William Todd was informed that the remainder of the evaluation will be completed by another provider, this initial triage assessment does not replace that evaluation, and the importance of remaining in the ED until their evaluation is complete.     William Bandy, PA-C 11/15/23 1645

## 2023-11-15 NOTE — ED Notes (Signed)
This tech assisted patient to RR. Pt was able to stand and pivot to the bathroom with minimal assistance. He had two large bowel movements. When standing him up and returning to wheelchair, he required maximum assistance by this tech and the phlebotomist. As he returned to the wheelchair, he became unresponsive and had a gaze. Ladona Ridgel, RN called for help and pt brought to trauma C.

## 2023-11-15 NOTE — ED Notes (Signed)
 Patient transported to MRI

## 2023-11-15 NOTE — H&P (Addendum)
History and Physical    William Todd:[id number] DOB: 06-03-1951 DOA: 11/15/2023  PCP: Knox Royalty, MD   Patient coming from: Home   Chief Complaint:  Chief Complaint  Patient presents with   Fall    HPI: History provided by patient's wife due to patient's memory deficit. William Todd is a 73 y.o. male with hx of CAD, hypertension, hyperlipidemia, diabetes, prostate cancer, who was brought in after fall 2 weeks ago and inability to ambulate since this time.  Patient had been refusing to be evaluated but wife had enough and decided to bring in this evening.  Patient is unable to provide any coherent history about why he is in the hospital today or what has been happening recently.  His wife reports 2 weeks ago he had walked up the stairs began to feel lightheaded, reach out for the wall but missed and fell forward.  He had complained of pain in the left lower extremity but this had improved.  Was unable to ambulate since the fall.  Otherwise wife notes that he sometimes "is talking out of his head", no known history of dementia.  He has significant memory impairment regarding short-term events.  No headache, speech changes, vision changes (chronic visual loss), numbness or focal weakness (apart from inability to ambulate on the left leg) in his extremities.  No other recent illness.   Review of Systems:  ROS complete and negative except as marked above   No Known Allergies  Prior to Admission medications   Medication Sig Start Date End Date Taking? Authorizing Provider  amLODipine (NORVASC) 10 MG tablet Take 0.5 tablets (5 mg total) by mouth daily. 01/03/13   Rinaldo Cloud, MD  aspirin EC 81 MG EC tablet Take 1 tablet (81 mg total) by mouth daily. 01/03/13   Rinaldo Cloud, MD  atorvastatin (LIPITOR) 80 MG tablet Take 0.5 tablets (40 mg total) by mouth daily at 6 PM. 01/03/13   Rinaldo Cloud, MD  clopidogrel (PLAVIX) 75 MG tablet  08/17/13   [provider]   escitalopram (LEXAPRO) 10 MG tablet Take 10 mg by mouth daily.    [provider]  famotidine (PEPCID) 20 MG tablet Take 1 tablet (20 mg total) by mouth 2 (two) times daily. 01/03/13   Rinaldo Cloud, MD  glimepiride (AMARYL) 4 MG tablet  08/30/13   [provider]  lisinopril (PRINIVIL,ZESTRIL) 20 MG tablet Take 20 mg by mouth daily.    [provider]  metoprolol succinate (TOPROL-XL) 50 MG 24 hr tablet Take 1 tablet (50 mg total) by mouth daily. Take with or immediately following a meal. 01/03/13   Rinaldo Cloud, MD  nitroGLYCERIN (NITROSTAT) 0.4 MG SL tablet Place 1 tablet (0.4 mg total) under the tongue every 5 (five) minutes x 3 doses as needed for chest pain. 01/03/13   Rinaldo Cloud, MD  sitaGLIPtin-metformin (JANUMET) 50-1000 MG per tablet Take 1 tablet by mouth 2 (two) times daily with a meal.    [provider]    Past Medical History:  Diagnosis Date   Anxiety    new dx   Diabetes mellitus    type ii   History of radiation therapy 09/29/13- 11/25/13   prostate 7800 cGy 40 sessions, seminal vesicles 5600 cGy 40 sessions   Hypertension    Prostate cancer (HCC) 06/01/13   gleason 7    Past Surgical History:  Procedure Laterality Date   CARDIAC CATHETERIZATION  01/01/2013   FRACTURE SURGERY  left foot    LEFT HEART CATHETERIZATION WITH CORONARY ANGIOGRAM N/A 01/01/2013   Procedure: LEFT HEART CATHETERIZATION WITH CORONARY ANGIOGRAM;  Surgeon: Robynn Pane, MD;  Location: [facility] CATH LAB;  Service: Cardiovascular;  Laterality: N/A;   PROSTATE BIOPSY  06/01/13     reports that he has never smoked. He has never used smokeless tobacco. He reports that he does not drink alcohol and does not use drugs.  Family History  Problem Relation Age of Onset   Prostate cancer Brother 25       seed implant     Physical Exam: Vitals:   11/15/23 1930 11/15/23 2030 11/15/23 2035 11/15/23 2115  BP:   110/61 114/69  Pulse: 83 78 72 80  Resp: (!) 25  (!) 21 10 16   Temp:      TempSrc:      SpO2: 100% 100% 99% 100%    Gen: Awake, alert, NAD HEENT: OD exotropia, pupillary defect OD, anisocoria OS > OD  CV: Regular, normal S1, S2, 2/6 SEM  Resp: Normal WOB, CTAB  Abd: Flat, normoactive, nontender MSK: Laying on his left side.  Distally neurovascularly intact in the left lower extremity. Skin: Skin changes of venous stasis, dry flaking skin on distal lower extremities. Neuro: Alert and interactive, attention intact, remote memory intact, 0/3 short-term recall, speech is clear without aphasia.  CN II through XII are intact.  Motor is 5 out of 5 and symmetric throughout, all fully tested in the left lower extremity due to history of hip fracture.  Sensation is intact and equal to fine touch. Psych: Anxious, appropriate  Data review:   Labs reviewed, notable for:   Creatinine 2.4, baseline 1, BUN 45 Hyperglycemic to 323 CK normal, Trop negative WBC 12, hemoglobin 10, normocytic.  Micro:  No results found for this or any previous visit.  Imaging reviewed:  CT Head Wo Contrast Result Date: 11/15/2023 CLINICAL DATA:  Altered mental status, fall EXAM: CT HEAD WITHOUT CONTRAST TECHNIQUE: Contiguous axial images were obtained from the base of the skull through the vertex without intravenous contrast. RADIATION DOSE REDUCTION: This exam was performed according to the departmental dose-optimization program which includes automated exposure control, adjustment of the mA and/or kV according to patient size and/or use of iterative reconstruction technique. COMPARISON:  None Available. FINDINGS: Brain: No acute territorial infarction, hemorrhage or intracranial mass. 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 Atrophy and advanced chronic small vessel ischemic changes of the white matter. Hypodensity at the pons, series 2, image 11 and sagittal series 6 image 26. Ventricles are nonenlarged Vascular: No hyperdense  vessels. Carotid and vertebral calcifications. Skull: Normal. Negative for fracture or focal lesion. Sinuses/Orbits: No acute finding. Other: None IMPRESSION: 1. Negative for acute intracranial hemorrhage or mass. 2. Hypodensity at the pons, though some of this may be related to artifact, the finding is rounded with some sparing of peripheral tissue and acute or subacute pontine infarct cannot be excluded. MRI is recommended for further evaluation. 3. Atrophy and chronic small vessel ischemic changes of the white matter Electronically Signed   By: Jasmine Pang M.D.   On: 11/15/2023 18:51   DG Ankle Complete Left Result Date: 11/15/2023 CLINICAL DATA:  Fall with ankle pain EXAM: LEFT ANKLE COMPLETE - 3+ VIEW COMPARISON:  01/30/2012 FINDINGS: Lateral plate and multiple fixating screws with additional medial malleolar fixating screws. Chronic postsurgical, posttraumatic and post infectious changes involving the distal tibia, distal fibula and tibiotalar joint. Fused appearance across  the tibiotalar joint and probable subtalar fusion as well. No definite acute fracture is seen. IMPRESSION: Chronic postsurgical, posttraumatic and post infectious changes involving the distal tibia, distal fibula and tibiotalar joint. No definite acute osseous abnormality. Electronically Signed   By: Jasmine Pang M.D.   On: 11/15/2023 18:09   DG Foot Complete Left Result Date: 11/15/2023 CLINICAL DATA:  Larey Seat 2 weeks ago with left foot pain. EXAM: LEFT FOOT - COMPLETE 3+ VIEW COMPARISON:  Left ankle 01/30/2012 FINDINGS: Diffuse bone demineralization. Degenerative changes in the interphalangeal joints, metatarsal-phalangeal, tarsometatarsal, and interphalangeal joints. Postoperative fusion of the ankle joint with plate and screw fixation. No evidence of acute fracture or dislocation. Soft tissues are unremarkable. IMPRESSION: 1. No acute bony abnormalities. 2. Degenerative changes in the left foot. 3. Postoperative changes with  surgical fusion of the left ankle including plate and screw fixation Electronically Signed   By: Burman Nieves M.D.   On: 11/15/2023 18:03   DG Hip Unilat W or Wo Pelvis 2-3 Views Left Result Date: 11/15/2023 CLINICAL DATA:  Fall 2 weeks ago. Dizziness with fall forward. Left foot pain. EXAM: DG HIP (WITH OR WITHOUT PELVIS) 2-3V LEFT COMPARISON:  None Available. FINDINGS: Transverse subcapital fracture of the left femoral neck with varus angulation of the fracture fragments. No inter trochanteric involvement is identified. No dislocation at the hip joint. Pelvis appears intact. SI joints and symphysis pubis are not displaced. No destructive or expansile bone lesions. Degenerative changes in the lower lumbar spine and both hips. Vascular calcifications. IMPRESSION: Acute transverse fracture of the left femoral neck with varus angulation of the fracture fragments. Degenerative changes. Electronically Signed   By: Burman Nieves M.D.   On: 11/15/2023 18:02    EKG:  Personally reviewed, artifact limiting interpretation.  Sinus rhythm, ?  RAD, suspect LVH, LBBB  ED Course:  Treated with 1 L NS.  Found to have left femoral neck fracture, EDP consulted with Dr. Eulah Pont orthopedics who will plan for OR tomorrow.   Assessment/Plan:  73 y.o. male with hx CAD, hypertension, hyperlipidemia, diabetes, prostate cancer, who was brought in after fall 2 weeks ago and inability to ambulate since this time.  Found to have a left femoral neck fracture.  Otherwise abnormal head CT with possible pontine stroke.  Clinically with significant short-term memory loss and suspected underlying dementia.  Left femoral neck fracture fracture Clinical diagnosis osteoporosis Ground-level fall, mechanical -Orthopedic surgery Dr. Eulah Pont consulted, tentative plan for OR tomorrow -N.p.o. after midnight -Antibiotic ppx per orthopedics  -DVT prophylaxis single dose of heparin this evening, then hold.  Readdress postop -Tele  monitoring -PT/OT eval postop ordered -Pain mgmt: Tylenol prn for mild, oxycodone 2.5/5 mg p.o. every 6 hours prn for moderate/severe, Dilaudid 0.5 mg IV every 4 hours prn for breakthrough -Check vitamin D, start Ca carbonate 500 mg elemental twice daily, and vitamin D3 1000 IU daily -Will need follow-up with PCP for further treatment of osteoporosis  Abnormal head CT Question pontine stroke.  No focal deficits on neurologic exam. -MRI brain without contrast - Swallow screen - Neurochecks  Short-term memory deficit, suspect underlying dementia Denies chronic issues with memory.  Attention/alertness intact, not consistent with delirium.  0-3 short-term recall, unable to provide any history about recent events. -Should have neurocognitive testing with PCP/neurology outpatient. -Check TSH, B12, RPR, HIV   Question acute kidney injury versus progressive CKD Remote creatinine 1, unknown baseline.  Elevated to 2.4 on admission.  Possibly prerenal with limited mobility after fall.  -  S/p 1 L IV fluid in the ED.  Continue maintenance IV fluid 100 cc /hr x 10 hour overnight. -Check PVR  Anemia, normocytic - Check iron panel, folate, B12.  Chronic medical problems: CAD: No longer taking antiplatelet, continue atorvastatin 40 mg Hypertension: Hold home lisinopril-HCTZ, metoprolol in setting of low normal blood pressure and possible AKI. Hyperlipidemia: See statin above Diabetes, with hyperglycemia: Osm glimepiride, Januvia.  SSI with at bedtime correction.  If remains high in the morning can start on basal insulin. Prostate cancer: History unclear.  No PCP notes in system.  Outpatient surveillance.  There is no height or weight on file to calculate BMI.    DVT prophylaxis:  SQ Heparin; hold after MN  Code Status:  Full Code Diet:  Diet Orders (From admission, onward)     Start     Ordered   11/15/23 2104  Diet NPO time specified Except for: Sips with Meds  Diet effective now        Question:  Except for  Answer:  [name] with Meds   11/15/23 2105           Family Communication: Yes discussed with his wife at the bedside Consults: Orthopedics Admission status:   Inpatient, Telemetry bed  Severity of Illness: The appropriate patient status for this patient is INPATIENT. Inpatient status is judged to be reasonable and necessary in order to provide the required intensity of service to ensure the patient's safety. The patient's presenting symptoms, physical exam findings, and initial radiographic and laboratory data in the context of their chronic comorbidities is felt to place them at high risk for further clinical deterioration. Furthermore, it is not anticipated that the patient will be medically stable for discharge from the hospital within 2 midnights of admission.   * I certify that at the point of admission it is my clinical judgment that the patient will require inpatient hospital care spanning beyond 2 midnights from the point of admission due to high intensity of service, high risk for further deterioration and high frequency of surveillance required.*   Dolly Rias, MD [facility]  How to contact the [facility] Attending or Consulting provider 7A - 7P or covering provider during after hours 7P -7A, for this patient.  Check the care team in [facility] and look for a) attending/consulting [facility] provider listed and b) the [facility] team listed Log into [email or web address] and use [place]'s universal password to access. If you do not have the password, please contact the hospital operator. Locate the [facility] provider you are looking for under [facility] and page to a number that you can be directly reached. If you still have difficulty reaching the provider, please page the [facility] (Director on Call) for the Hospitalists listed on amion for assistance.  11/15/2023, 9:32 PM

## 2023-11-15 NOTE — ED Provider Notes (Signed)
[facility] AT [facility] Provider Note   CSN: [id number] Arrival date & time: 11/15/23  1530     History  Chief Complaint  Patient presents with   [facility]    ABDALRAHMAN CLEMENTSON is a 73 y.o. male with past medical history significant for hypertension, diabetes, prostate cancer who presents secondary to a fall 2 weeks ago.  Patient was trying to go up the stairs when he felt dizzy, fell forward.  Denies head injury or loss of consciousness but somewhat confused.  Endorses left foot and hip pain at the time but reports no significant pain at this time.  Unable to walk since the fall.  Wife reports increased confusion.  Denies any chest pain, shortness of breath, vision changes.  Previously on Plavix but not currently on any blood thinner reportedly.  Confirmed with current med list, not currently taking Plavix.   Fall       Home Medications Prior to Admission medications   Medication Sig Start Date End Date Taking? Authorizing Provider  amLODipine (NORVASC) 10 MG tablet Take 0.5 tablets (5 mg total) by mouth daily. 01/03/13   Rinaldo Cloud, MD  aspirin EC 81 MG EC tablet Take 1 tablet (81 mg total) by mouth daily. 01/03/13   Rinaldo Cloud, MD  atorvastatin (LIPITOR) 80 MG tablet Take 0.5 tablets (40 mg total) by mouth daily at 6 PM. 01/03/13   Rinaldo Cloud, MD  clopidogrel (PLAVIX) 75 MG tablet  08/17/13   [provider]  escitalopram (LEXAPRO) 10 MG tablet Take 10 mg by mouth daily.    [provider]  famotidine (PEPCID) 20 MG tablet Take 1 tablet (20 mg total) by mouth 2 (two) times daily. 01/03/13   Rinaldo Cloud, MD  glimepiride (AMARYL) 4 MG tablet  08/30/13   [provider]  lisinopril (PRINIVIL,ZESTRIL) 20 MG tablet Take 20 mg by mouth daily.    [provider]  metoprolol succinate (TOPROL-XL) 50 MG 24 hr tablet Take 1 tablet (50 mg total) by mouth daily. Take with or immediately following a meal. 01/03/13    Rinaldo Cloud, MD  nitroGLYCERIN (NITROSTAT) 0.4 MG SL tablet Place 1 tablet (0.4 mg total) under the tongue every 5 (five) minutes x 3 doses as needed for chest pain. 01/03/13   Rinaldo Cloud, MD  sitaGLIPtin-metformin (JANUMET) 50-1000 MG per tablet Take 1 tablet by mouth 2 (two) times daily with a meal.    [provider]      Allergies    Patient has no known allergies.    Review of Systems   Review of Systems  All other systems reviewed and are negative.   Physical Exam Updated Vital Signs BP 114/71   Pulse 77   Temp 97.6 F (36.4 C) (Temporal)   Resp 18   SpO2 100%  Physical Exam Vitals and nursing note reviewed.  Constitutional:      General: He is not in acute distress.    Appearance: Normal appearance.  HENT:     Head: Normocephalic and atraumatic.  Eyes:     General:        Right eye: No discharge.        Left eye: No discharge.  Cardiovascular:     Rate and Rhythm: Normal rate and regular rhythm.     Pulses: Normal pulses.     Heart sounds: No murmur heard.    No friction rub. No gallop.     Comments: DP, PT pulses 2+  on the affected left lower extremity Pulmonary:     Effort: Pulmonary effort is normal.     Breath sounds: Normal breath sounds.  Abdominal:     General: Bowel sounds are normal.     Palpations: Abdomen is soft.  Musculoskeletal:     Comments: Significant left leg shortening 6 inches to 1 foot without significant external rotation.  He does have strength to flexion at the left hip, 4/5.  Endorses increased pain with attempted flexion.  Intact strength to dorsiflexion, plantarflexion on the left ankle.  No significant tenderness palpation of left lateral hip, left ankle.  Skin:    General: Skin is warm and dry.     Capillary Refill: Capillary refill takes less than 2 seconds.  Neurological:     Mental Status: He is alert and oriented to person, place, and time.  Psychiatric:        Mood and Affect: Mood normal.        Behavior:  Behavior normal.     ED Results / Procedures / Treatments   Labs (all labs ordered are listed, but only abnormal results are displayed) Labs Reviewed  CBC WITH DIFFERENTIAL/PLATELET - Abnormal; Notable for the following components:      Result Value   WBC 12.6 (*)    RBC 3.32 (*)    Hemoglobin 10.1 (*)    HCT 30.7 (*)    Neutro Abs 11.1 (*)    Abs Immature Granulocytes 0.17 (*)    All other components within normal limits  COMPREHENSIVE METABOLIC PANEL - Abnormal; Notable for the following components:   Glucose, Bld 323 (*)    BUN 45 (*)    Creatinine, Ser 2.49 (*)    Albumin 2.9 (*)    AST 45 (*)    GFR, Estimated 27 (*)    All other components within normal limits  CBG MONITORING, ED - Abnormal; Notable for the following components:   Glucose-Capillary 271 (*)    All other components within normal limits  CK  URINALYSIS, ROUTINE W REFLEX MICROSCOPIC  TROPONIN I (HIGH SENSITIVITY)  TROPONIN I (HIGH SENSITIVITY)    EKG EKG Interpretation Date/Time:  Saturday November 15 2023 17:13:46 EST Ventricular Rate:  206 PR Interval:  210 QRS Duration:  194 QT Interval:  411 QTC Calculation: 556 R Axis:   101  Text Interpretation: Right and left arm electrode reversal, interpretation assumes no reversal Extreme tachycardia with wide complex, no further rhythm analysis attempted Artifact in lead(s) II aVR aVF V1 V3 V4 V5 V6 No significant change since last tracing Confirmed by Gwyneth Sprout ([id number]) on 11/15/2023 5:36:16 PM  Radiology CT Head Wo Contrast Result Date: 11/15/2023 CLINICAL DATA:  Altered mental status, fall EXAM: CT HEAD WITHOUT CONTRAST TECHNIQUE: Contiguous axial images were obtained from the base of the skull through the vertex without intravenous contrast. RADIATION DOSE REDUCTION: This exam was performed according to the departmental dose-optimization program which includes automated exposure control, adjustment of the mA and/or kV according to patient size  and/or use of iterative reconstruction technique. COMPARISON:  None Available. FINDINGS: Brain: No acute territorial infarction, hemorrhage or intracranial mass. 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 Atrophy and advanced chronic small vessel ischemic changes of the white matter. Hypodensity at the pons, series 2, image 11 and sagittal series 6 image 26. Ventricles are nonenlarged Vascular: No hyperdense vessels. Carotid and vertebral calcifications. Skull: Normal. Negative for fracture or focal lesion. Sinuses/Orbits: No acute finding. Other: None IMPRESSION:  1. Negative for acute intracranial hemorrhage or mass. 2. Hypodensity at the pons, though some of this may be related to artifact, the finding is rounded with some sparing of peripheral tissue and acute or subacute pontine infarct cannot be excluded. MRI is recommended for further evaluation. 3. Atrophy and chronic small vessel ischemic changes of the white matter Electronically Signed   By: Jasmine Pang M.D.   On: 11/15/2023 18:51   DG Ankle Complete Left Result Date: 11/15/2023 CLINICAL DATA:  Fall with ankle pain EXAM: LEFT ANKLE COMPLETE - 3+ VIEW COMPARISON:  01/30/2012 FINDINGS: Lateral plate and multiple fixating screws with additional medial malleolar fixating screws. Chronic postsurgical, posttraumatic and post infectious changes involving the distal tibia, distal fibula and tibiotalar joint. Fused appearance across the tibiotalar joint and probable subtalar fusion as well. No definite acute fracture is seen. IMPRESSION: Chronic postsurgical, posttraumatic and post infectious changes involving the distal tibia, distal fibula and tibiotalar joint. No definite acute osseous abnormality. Electronically Signed   By: Jasmine Pang M.D.   On: 11/15/2023 18:09   DG Foot Complete Left Result Date: 11/15/2023 CLINICAL DATA:  Larey Seat 2 weeks ago with left foot pain. EXAM: LEFT FOOT - COMPLETE 3+ VIEW COMPARISON:  Left  ankle 01/30/2012 FINDINGS: Diffuse bone demineralization. Degenerative changes in the interphalangeal joints, metatarsal-phalangeal, tarsometatarsal, and interphalangeal joints. Postoperative fusion of the ankle joint with plate and screw fixation. No evidence of acute fracture or dislocation. Soft tissues are unremarkable. IMPRESSION: 1. No acute bony abnormalities. 2. Degenerative changes in the left foot. 3. Postoperative changes with surgical fusion of the left ankle including plate and screw fixation Electronically Signed   By: Burman Nieves M.D.   On: 11/15/2023 18:03   DG Hip Unilat W or Wo Pelvis 2-3 Views Left Result Date: 11/15/2023 CLINICAL DATA:  Fall 2 weeks ago. Dizziness with fall forward. Left foot pain. EXAM: DG HIP (WITH OR WITHOUT PELVIS) 2-3V LEFT COMPARISON:  None Available. FINDINGS: Transverse subcapital fracture of the left femoral neck with varus angulation of the fracture fragments. No inter trochanteric involvement is identified. No dislocation at the hip joint. Pelvis appears intact. SI joints and symphysis pubis are not displaced. No destructive or expansile bone lesions. Degenerative changes in the lower lumbar spine and both hips. Vascular calcifications. IMPRESSION: Acute transverse fracture of the left femoral neck with varus angulation of the fracture fragments. Degenerative changes. Electronically Signed   By: Burman Nieves M.D.   On: 11/15/2023 18:02    Procedures Procedures    Medications Ordered in ED Medications  sodium chloride 0.9 % bolus 1,000 mL (0 mLs Intravenous Stopped 11/15/23 1918)    ED Course/ Medical Decision Making/ A&P Clinical Course as of 11/15/23 2036  Sat Nov 15, 2023  1949 Murphy to OR, NPO after midnight  [CP]    Clinical Course User Index [CP] Olene Floss, PA-C                                 Medical Decision Making Amount and/or Complexity of Data Reviewed Labs: ordered. Radiology: ordered.   This patient is a  73 y.o. male who presents to the ED for concern of fall, inability to walk, confusion, this involves an extensive number of treatment options, and is a complaint that carries with it a high risk of complications and morbidity. The emergent differential diagnosis prior to evaluation includes, but is not limited to,  CVA,  seizure, hypotension, sepsis, hypoglycemia, hypoxic encephalopathy, metabolic encephalopathy, polypharmacy, substance abuse, developing dementia or alzheimers, meningitis, encephalitis, hypertensive emergency, other systemic infection, acute alcohol intoxication, acute alcohol or other drug withdrawal or psychiatric manifestation vs other, in reference to his fall suspect hip fracture, considered ankle fracture, or other fractures additionally. This is not an exhaustive differential.   Past Medical History / Co-morbidities / Social History: Hypertension, anxiety, diabetes, prostate cancer   Physical Exam: Physical exam performed. The pertinent findings include:  Significant left leg shortening 6 inches to 1 foot without significant external rotation.  He does have strength to flexion at the left hip, 4/5.  Endorses increased pain with attempted flexion.  Intact strength to dorsiflexion, plantarflexion on the left ankle.  No significant tenderness palpation of left lateral hip, left ankle.   Lab Tests: I ordered, and personally interpreted labs.  The pertinent results include: CBC notable for leukocytosis, white blood cells 12.6, likely stress from recent fracture.   Imaging Studies: I ordered and independently interpreted imaging studies including plain film radiograph of the left ankle, left foot, left hip.  Images are notable for acute transverse fracture of left hip, postsurgical changes of the left foot with no acute abnormality, no abnormality of the left ankle.  I independently interpreted CT head which shows pons hypodensity, age-related atrophy, given his altered mental status I  think that a MR to further evaluate could be reasonable although given unclear.  I agree with the radiologist interpretation.   Cardiac Monitoring:  The patient was maintained on a cardiac monitor.  My attending physician Dr. Anitra Lauth viewed and interpreted the cardiac monitored which showed an underlying rhythm of: Normal sinus rhythm, no significant change from last baseline, poor waveform on initial EKG, no evidence of acute ST-T changes. I agree with this interpretation.   Medications: I ordered medication including bolus for suspected dehydration  Consultations Obtained: I requested consultation with the hospitalist, spoke with Dr. Lazarus Salines who agrees to admission for broken hip, confusion, kidney disease.,  and discussed lab and imaging findings as well as pertinent plan - they recommend: Admission to the hospital for broken hip, altered mental status, spoke with the surgeon with orthopedics, Dr. Eulah Pont who will take him to the OR tomorrow, n.p.o. after midnight.   Disposition: After consideration of the diagnostic results and the patients response to treatment, I feel that patient would benefit from hospital admission secondary to broken hip.   I discussed this case with my attending physician Dr. Anitra Lauth who cosigned this note including patient's presenting symptoms, physical exam, and planned diagnostics and interventions. Attending physician stated agreement with plan or made changes to plan which were implemented.    Final Clinical Impression(s) / ED Diagnoses Final diagnoses:  None    Rx / DC Orders ED Discharge Orders     None         [place] 11/15/23 2036    Gwyneth Sprout, MD 11/17/23 0004

## 2023-11-15 NOTE — ED Triage Notes (Signed)
Pt coming in today to be evaluated s/p fall 2 weeks ago. Pt was trying to go up stairs when he felt dizzy and fell forward. Pt denies head injury or LOC. Pt c.o left foot pain (hx of previous fx) and bruising to left hip but no pain there at this time. Pt unable to walk since the fall.

## 2023-11-16 ENCOUNTER — Other Ambulatory Visit: Payer: Self-pay

## 2023-11-16 ENCOUNTER — Encounter (HOSPITAL_COMMUNITY): Payer: Self-pay | Admitting: Internal Medicine

## 2023-11-16 ENCOUNTER — Encounter (HOSPITAL_COMMUNITY)
Admission: EM | Disposition: A | Discharge status: Skilled Nursing Facility | Payer: Self-pay | Source: Home / Self Care | Attending: Student

## 2023-11-16 ENCOUNTER — Inpatient Hospital Stay (HOSPITAL_COMMUNITY): Payer: Medicare HMO | Admitting: Anesthesiology

## 2023-11-16 ENCOUNTER — Inpatient Hospital Stay (HOSPITAL_COMMUNITY): Payer: Medicare HMO

## 2023-11-16 DIAGNOSIS — D649 Anemia, unspecified: Secondary | ICD-10-CM | POA: Diagnosis not present

## 2023-11-16 DIAGNOSIS — E1169 Type 2 diabetes mellitus with other specified complication: Secondary | ICD-10-CM

## 2023-11-16 DIAGNOSIS — E119 Type 2 diabetes mellitus without complications: Secondary | ICD-10-CM | POA: Diagnosis not present

## 2023-11-16 DIAGNOSIS — I10 Essential (primary) hypertension: Secondary | ICD-10-CM

## 2023-11-16 DIAGNOSIS — S72002D Fracture of unspecified part of neck of left femur, subsequent encounter for closed fracture with routine healing: Secondary | ICD-10-CM | POA: Diagnosis not present

## 2023-11-16 DIAGNOSIS — S72002A Fracture of unspecified part of neck of left femur, initial encounter for closed fracture: Secondary | ICD-10-CM | POA: Diagnosis not present

## 2023-11-16 DIAGNOSIS — N179 Acute kidney failure, unspecified: Secondary | ICD-10-CM | POA: Diagnosis not present

## 2023-11-16 HISTORY — PX: HIP ARTHROPLASTY: SHX981

## 2023-11-16 LAB — GLUCOSE, CAPILLARY
Glucose-Capillary: 80 mg/dL (ref 70–99)
Glucose-Capillary: 89 mg/dL (ref 70–99)

## 2023-11-16 LAB — LIPID PANEL
Cholesterol: 108 mg/dL (ref 0–200)
HDL: 30 mg/dL — ABNORMAL LOW (ref >40)
LDL Cholesterol: 65 mg/dL (ref 0–99)
Total CHOL/HDL Ratio: 3.6 {ratio}
Triglycerides: 66 mg/dL (ref <150)
VLDL: 13 mg/dL (ref 0–40)

## 2023-11-16 LAB — URINALYSIS, ROUTINE W REFLEX MICROSCOPIC
Bilirubin Urine: NEGATIVE
Glucose, UA: 50 mg/dL — AB
Hgb urine dipstick: NEGATIVE
Ketones, ur: NEGATIVE mg/dL
Leukocytes,Ua: NEGATIVE
Nitrite: NEGATIVE
Protein, ur: NEGATIVE mg/dL
Specific Gravity, Urine: 1.013 (ref 1.005–1.030)
pH: 5 (ref 5.0–8.0)

## 2023-11-16 LAB — CBC
HCT: 26.2 % — ABNORMAL LOW (ref 39.0–52.0)
Hemoglobin: 9 g/dL — ABNORMAL LOW (ref 13.0–17.0)
MCH: 31.1 pg (ref 26.0–34.0)
MCHC: 34.4 g/dL (ref 30.0–36.0)
MCV: 90.7 fL (ref 80.0–100.0)
Platelets: 208 10*3/uL (ref 150–400)
RBC: 2.89 MIL/uL — ABNORMAL LOW (ref 4.22–5.81)
RDW: 12.3 % (ref 11.5–15.5)
WBC: 9.6 10*3/uL (ref 4.0–10.5)
nRBC: 0 % (ref 0.0–0.2)

## 2023-11-16 LAB — SURGICAL PCR SCREEN
MRSA, PCR: NEGATIVE
Staphylococcus aureus: NEGATIVE

## 2023-11-16 LAB — MAGNESIUM: Magnesium: 2.2 mg/dL (ref 1.7–2.4)

## 2023-11-16 LAB — BASIC METABOLIC PANEL
Anion gap: 10 (ref 5–15)
BUN: 40 mg/dL — ABNORMAL HIGH (ref 8–23)
CO2: 25 mmol/L (ref 22–32)
Calcium: 9.1 mg/dL (ref 8.9–10.3)
Chloride: 105 mmol/L (ref 98–111)
Creatinine, Ser: 1.94 mg/dL — ABNORMAL HIGH (ref 0.61–1.24)
GFR, Estimated: 36 mL/min — ABNORMAL LOW (ref >60)
Glucose, Bld: 121 mg/dL — ABNORMAL HIGH (ref 70–99)
Potassium: 4.1 mmol/L (ref 3.5–5.1)
Sodium: 140 mmol/L (ref 135–145)

## 2023-11-16 LAB — FOLATE: Folate: 12.2 ng/mL (ref >5.9)

## 2023-11-16 LAB — HIV ANTIBODY (ROUTINE TESTING W REFLEX): HIV Screen 4th Generation wRfx: NONREACTIVE

## 2023-11-16 LAB — IRON AND TIBC
Iron: 44 ug/dL — ABNORMAL LOW (ref 45–182)
Saturation Ratios: 24 % (ref 17.9–39.5)
TIBC: 185 ug/dL — ABNORMAL LOW (ref 250–450)
UIBC: 141 ug/dL

## 2023-11-16 LAB — PROTIME-INR
INR: 1.3 — ABNORMAL HIGH (ref 0.8–1.2)
Prothrombin Time: 15.9 s — ABNORMAL HIGH (ref 11.4–15.2)

## 2023-11-16 LAB — HEMOGLOBIN A1C
Hgb A1c MFr Bld: 6.4 % — ABNORMAL HIGH (ref 4.8–5.6)
Mean Plasma Glucose: 136.98 mg/dL

## 2023-11-16 LAB — FERRITIN: Ferritin: 312 ng/mL (ref 24–336)

## 2023-11-16 LAB — TSH: TSH: 1.026 u[IU]/mL (ref 0.350–4.500)

## 2023-11-16 LAB — PHOSPHORUS: Phosphorus: 2.9 mg/dL (ref 2.5–4.6)

## 2023-11-16 LAB — VITAMIN B12: Vitamin B-12: 708 pg/mL (ref 180–914)

## 2023-11-16 LAB — TRANSFERRIN: Transferrin: 130 mg/dL — ABNORMAL LOW (ref 180–329)

## 2023-11-16 LAB — VITAMIN D 25 HYDROXY (VIT D DEFICIENCY, FRACTURES): Vit D, 25-Hydroxy: 52.1 ng/mL (ref 30–100)

## 2023-11-16 SURGERY — HEMIARTHROPLASTY, HIP, DIRECT ANTERIOR APPROACH, FOR FRACTURE
Anesthesia: Monitor Anesthesia Care | Site: Hip | Laterality: Left

## 2023-11-16 MED ORDER — METOPROLOL SUCCINATE ER 25 MG PO TB24: 50.0000 mg | ORAL_TABLET | Freq: Once | ORAL | Status: AC

## 2023-11-16 MED ORDER — ACETAMINOPHEN 160 MG/5ML PO SOLN: 1000.0000 mg | Freq: Once | ORAL | Status: DC | PRN

## 2023-11-16 MED ORDER — TRANEXAMIC ACID-NACL 1000-0.7 MG/100ML-% IV SOLN
1000.0000 mg | INTRAVENOUS | Status: AC
  Administered 2023-11-16: 1000 mg via INTRAVENOUS

## 2023-11-16 MED ORDER — DEXAMETHASONE SODIUM PHOSPHATE 10 MG/ML IJ SOLN
INTRAMUSCULAR | Status: AC
  Filled 2023-11-16: qty 1

## 2023-11-16 MED ORDER — PROPOFOL 500 MG/50ML IV EMUL
INTRAVENOUS | Status: DC | PRN
  Administered 2023-11-16: 50 ug/kg/min via INTRAVENOUS

## 2023-11-16 MED ORDER — CEFAZOLIN SODIUM-DEXTROSE 2-4 GM/100ML-% IV SOLN
2.0000 g | Freq: Once | INTRAVENOUS | Status: AC
  Administered 2023-11-16: 2 g via INTRAVENOUS

## 2023-11-16 MED ORDER — FENTANYL CITRATE (PF) 250 MCG/5ML IJ SOLN
INTRAMUSCULAR | Status: AC
  Filled 2023-11-16: qty 5

## 2023-11-16 MED ORDER — MUPIROCIN 2 % EX OINT
1.0000 | TOPICAL_OINTMENT | Freq: Two times a day (BID) | CUTANEOUS | Status: DC
  Administered 2023-11-16 – 2023-11-20 (×9): 1 via NASAL
  Filled 2023-11-16 (×2): qty 22

## 2023-11-16 MED ORDER — METOPROLOL SUCCINATE ER 25 MG PO TB24
ORAL_TABLET | ORAL | Status: AC
  Administered 2023-11-16: 50 mg via ORAL
  Filled 2023-11-16: qty 2

## 2023-11-16 MED ORDER — CHLORHEXIDINE GLUCONATE 0.12 % MT SOLN: 15.0000 mL | Freq: Once | OROMUCOSAL | Status: AC

## 2023-11-16 MED ORDER — 0.9 % SODIUM CHLORIDE (POUR BTL) OPTIME
TOPICAL | Status: DC | PRN
  Administered 2023-11-16: 1000 mL

## 2023-11-16 MED ORDER — ALBUMIN HUMAN 5 % IV SOLN: INTRAVENOUS | Status: DC | PRN

## 2023-11-16 MED ORDER — ACETAMINOPHEN 10 MG/ML IV SOLN: 1000.0000 mg | Freq: Once | INTRAVENOUS | Status: DC | PRN

## 2023-11-16 MED ORDER — PHENYLEPHRINE HCL-NACL 20-0.9 MG/250ML-% IV SOLN
INTRAVENOUS | Status: AC
  Filled 2023-11-16: qty 250

## 2023-11-16 MED ORDER — ONDANSETRON HCL 4 MG/2ML IJ SOLN
INTRAMUSCULAR | Status: DC | PRN
  Administered 2023-11-16: 4 mg via INTRAVENOUS

## 2023-11-16 MED ORDER — ONDANSETRON HCL 4 MG/2ML IJ SOLN
INTRAMUSCULAR | Status: AC
  Filled 2023-11-16: qty 2

## 2023-11-16 MED ORDER — FENTANYL CITRATE (PF) 100 MCG/2ML IJ SOLN
25.0000 ug | INTRAMUSCULAR | Status: DC | PRN
  Administered 2023-11-16: 50 ug via INTRAVENOUS

## 2023-11-16 MED ORDER — ORAL CARE MOUTH RINSE: 15.0000 mL | Freq: Once | OROMUCOSAL | Status: AC

## 2023-11-16 MED ORDER — TRANEXAMIC ACID-NACL 1000-0.7 MG/100ML-% IV SOLN
INTRAVENOUS | Status: AC
  Filled 2023-11-16: qty 100

## 2023-11-16 MED ORDER — CHLORHEXIDINE GLUCONATE 0.12 % MT SOLN
OROMUCOSAL | Status: AC
  Administered 2023-11-16: 15 mL via OROMUCOSAL
  Filled 2023-11-16: qty 15

## 2023-11-16 MED ORDER — PHENYLEPHRINE HCL-NACL 20-0.9 MG/250ML-% IV SOLN
INTRAVENOUS | Status: DC | PRN
  Administered 2023-11-16: 50 ug/min via INTRAVENOUS

## 2023-11-16 MED ORDER — BUPIVACAINE IN DEXTROSE 0.75-8.25 % IT SOLN
INTRATHECAL | Status: DC | PRN
  Administered 2023-11-16: 1.6 mL via INTRATHECAL

## 2023-11-16 MED ORDER — DEXAMETHASONE SODIUM PHOSPHATE 10 MG/ML IJ SOLN
INTRAMUSCULAR | Status: DC | PRN
  Administered 2023-11-16: 6 mg via INTRAVENOUS

## 2023-11-16 MED ORDER — CEFAZOLIN SODIUM-DEXTROSE 2-4 GM/100ML-% IV SOLN
INTRAVENOUS | Status: AC
  Filled 2023-11-16: qty 100

## 2023-11-16 MED ORDER — OXYCODONE HCL 5 MG PO TABS: 5.0000 mg | ORAL_TABLET | Freq: Once | ORAL | Status: DC | PRN

## 2023-11-16 MED ORDER — SODIUM CHLORIDE 0.9 % IV SOLN: INTRAVENOUS | Status: DC

## 2023-11-16 MED ORDER — OXYCODONE HCL 5 MG/5ML PO SOLN: 5.0000 mg | Freq: Once | ORAL | Status: DC | PRN

## 2023-11-16 MED ORDER — ACETAMINOPHEN 500 MG PO TABS: 1000.0000 mg | ORAL_TABLET | Freq: Once | ORAL | Status: DC | PRN

## 2023-11-16 MED ORDER — INSULIN ASPART 100 UNIT/ML IJ SOLN: 0.0000 [IU] | INTRAMUSCULAR | Status: DC | PRN

## 2023-11-16 SURGICAL SUPPLY — 59 items
BAG COUNTER SPONGE SURGICOUNT (BAG) IMPLANT
BLADE SAW SGTL 73X25 THK (BLADE) ×1 IMPLANT
BLADE SURG SZ10 CARB STEEL (BLADE) ×2 IMPLANT
BRUSH FEMORAL CANAL (MISCELLANEOUS) IMPLANT
CHLORAPREP W/TINT 26 (MISCELLANEOUS) ×1 IMPLANT
COVER SURGICAL LIGHT HANDLE (MISCELLANEOUS) ×1 IMPLANT
DRAPE IMP U-DRAPE 54X76 (DRAPES) ×1 IMPLANT
DRAPE INCISE IOBAN 66X45 STRL (DRAPES) ×1 IMPLANT
DRAPE SURG ORHT 6 SPLT 77X108 (DRAPES) ×2 IMPLANT
DRAPE U-SHAPE 47X51 STRL (DRAPES) ×1 IMPLANT
DRESSING MEPILEX FLEX 4X4 (GAUZE/BANDAGES/DRESSINGS) IMPLANT
DRSG MEPILEX FLEX 4X4 (GAUZE/BANDAGES/DRESSINGS) ×1 IMPLANT
DRSG MEPILEX POST OP 4X12 (GAUZE/BANDAGES/DRESSINGS) IMPLANT
DRSG MEPILEX POST OP 4X8 (GAUZE/BANDAGES/DRESSINGS) ×1 IMPLANT
DRSG MEPILEX SACRM 8.7X9.8 (GAUZE/BANDAGES/DRESSINGS) IMPLANT
ELECT BLADE TIP CTD 4 INCH (ELECTRODE) ×1 IMPLANT
ELECT REM PT RETURN 15FT ADLT (MISCELLANEOUS) ×1 IMPLANT
EVACUATOR 1/8 PVC DRAIN (DRAIN) IMPLANT
FACESHIELD WRAPAROUND (MASK) ×2 IMPLANT
FACESHIELD WRAPAROUND OR TEAM (MASK) ×4 IMPLANT
GAUZE PAD ABD 8X10 STRL (GAUZE/BANDAGES/DRESSINGS) IMPLANT
GAUZE SPONGE 4X4 12PLY STRL (GAUZE/BANDAGES/DRESSINGS) IMPLANT
GLOVE BIO SURGEON STRL SZ7.5 (GLOVE) ×2 IMPLANT
GLOVE BIOGEL PI IND STRL 7.5 (GLOVE) ×1 IMPLANT
GLOVE BIOGEL PI IND STRL 8 (GLOVE) ×1 IMPLANT
GLOVE SURG SYN 7.5 E (GLOVE) ×1 IMPLANT
GLOVE SURG SYN 7.5 PF PI (GLOVE) ×1 IMPLANT
GOWN STRL REUS W/ TWL LRG LVL3 (GOWN DISPOSABLE) ×1 IMPLANT
GOWN STRL REUS W/ TWL XL LVL3 (GOWN DISPOSABLE) ×1 IMPLANT
HEAD UNPLR 50XMDLR STRL HIP (Orthopedic Implant) IMPLANT
HEAD UNPLR 53XMDLR STRL HIP (Orthopedic Implant) IMPLANT
KIT BASIN OR (CUSTOM PROCEDURE TRAY) ×1 IMPLANT
KIT TURNOVER KIT A (KITS) IMPLANT
MANIFOLD NEPTUNE II (INSTRUMENTS) ×1 IMPLANT
NS IRRIG 1000ML POUR BTL (IV SOLUTION) ×1 IMPLANT
PACK TOTAL JOINT (CUSTOM PROCEDURE TRAY) ×1 IMPLANT
PRESSURIZER FEMORAL UNIV (MISCELLANEOUS) IMPLANT
PROTECTOR NERVE ULNAR (MISCELLANEOUS) ×2 IMPLANT
RETRIEVER SUT HEWSON (MISCELLANEOUS) ×1 IMPLANT
SLEEVE UNITRAX (Orthopedic Implant) IMPLANT
STAPLER VISISTAT 35W (STAPLE) ×1 IMPLANT
STEM 37MM HIP (Hips) IMPLANT
STRIP CLOSURE SKIN 1/2X4 (GAUZE/BANDAGES/DRESSINGS) IMPLANT
STRIP CLOSURE SKIN 1/4X4 (GAUZE/BANDAGES/DRESSINGS) ×1 IMPLANT
SUCTION TUBE FRAZIER 12FR DISP (SUCTIONS) ×1 IMPLANT
SUT FIBERWIRE #2 38 T-5 BLUE (SUTURE) ×5 IMPLANT
SUT MNCRL AB 4-0 PS2 18 (SUTURE) ×1 IMPLANT
SUT STRATAFIX 0 PDS 27 VIOLET (SUTURE) ×1 IMPLANT
SUT VIC AB 0 CT1 36 (SUTURE) ×2 IMPLANT
SUT VIC AB 1 CTX36XBRD ANBCTR (SUTURE) ×2 IMPLANT
SUT VIC AB 2-0 CT1 TAPERPNT 27 (SUTURE) ×2 IMPLANT
SUTURE FIBERWR #2 38 T-5 BLUE (SUTURE) IMPLANT
SUTURE STRATFX 0 PDS 27 VIOLET (SUTURE) ×1 IMPLANT
SYR 50ML LL SCALE MARK (SYRINGE) ×1 IMPLANT
TOWEL OR 17X26 10 PK STRL BLUE (TOWEL DISPOSABLE) ×1 IMPLANT
TOWEL OR NON WOVEN STRL DISP B (DISPOSABLE) ×1 IMPLANT
TRAY CATH INTERMITTENT SS 16FR (CATHETERS) IMPLANT
TRAY FOLEY MTR SLVR 16FR STAT (SET/KITS/TRAYS/PACK) ×1 IMPLANT
WATER STERILE IRR 1000ML POUR (IV SOLUTION) ×2 IMPLANT

## 2023-11-16 NOTE — Anesthesia Preprocedure Evaluation (Signed)
Anesthesia Evaluation  Patient identified by MRN, date of birth, ID band Patient confused    Reviewed: Allergy & Precautions, NPO status , Patient's Chart, lab work & pertinent test results  History of Anesthesia Complications Negative for: history of anesthetic complications  Airway Mallampati: II  TM Distance: >3 FB Neck ROM: Full    Dental  (+) Dental Advisory Given, Teeth Intact   Pulmonary neg shortness of breath, neg sleep apnea, neg COPD, neg recent URI   breath sounds clear to auscultation       Cardiovascular hypertension, Pt. on medications  Rhythm:Regular     Neuro/Psych negative neurological ROS     GI/Hepatic   Endo/Other  diabetes    Renal/GU Renal diseaseLab Results      Component                Value               Date                      NA                       140                 11/16/2023                K                        4.1                 11/16/2023                CO2                      25                  11/16/2023                GLUCOSE                  121 (H)             11/16/2023                BUN                      40 (H)              11/16/2023                CREATININE               1.94 (H)            11/16/2023                CALCIUM                  9.1                 11/16/2023                GFRNONAA                 36 (L)              11/16/2023                Musculoskeletal Left Hip  Fracture   Abdominal   Peds  Hematology Lab Results      Component                Value               Date                      WBC                      9.6                 11/16/2023                HGB                      9.0 (L)             11/16/2023                HCT                      26.2 (L)            11/16/2023                MCV                      90.7                11/16/2023                PLT                      208                 11/16/2023            Lab  Results      Component                Value               Date                      INR                      1.3 (H)             11/16/2023                INR                      1.23                01/01/2013                INR                      4.91 (H)            01/01/2013            Not actively taking plavix   Anesthesia Other Findings   Reproductive/Obstetrics                              Anesthesia Physical Anesthesia Plan  ASA: 3  Anesthesia Plan: MAC and Spinal  Post-op Pain Management: Minimal or no pain anticipated   Induction: Intravenous  PONV Risk Score and Plan: 1 and Propofol infusion and Treatment may vary due to age or medical condition  Airway Management Planned: Nasal Cannula, Natural Airway and Simple Face Mask  Additional Equipment: None  Intra-op Plan:   Post-operative Plan:   Informed Consent: I have reviewed the patients History and Physical, chart, labs and discussed the procedure including the risks, benefits and alternatives for the proposed anesthesia with the patient or authorized representative who has indicated his/her understanding and acceptance.     Dental advisory given  Plan Discussed with: CRNA  Anesthesia Plan Comments:          Anesthesia Quick Evaluation

## 2023-11-16 NOTE — Progress Notes (Signed)
[facility]                                                                               William Todd, is a 73 y.o. male, DOB - 01/19/1951, ZOX:[id number] Admit date - 11/15/2023    Outpatient Primary MD for the patient is Knox Royalty, MD  LOS - 1  days    Brief summary   No notes on fileWaverly C Todd is a 73 y.o. male with hx of CAD, hypertension, hyperlipidemia, diabetes, prostate cancer, who was brought in after fall 2 weeks ago and inability to ambulate since this time.  Patient had been refusing to be evaluated but wife had enough and decided to bring him to the hospital. He has significant memory impairment regarding short-term events.  X rays show shows an acute transverse fracture of the left femoral neck with varus angulation of the fracture fragments.  Orthopedics consulted.   Assessment & Plan    Assessment and Plan:  Left femoral neck fracture:  Orthopedics on board.  Plan for OR Today for possible left him hemiarthroplasty.  Pain control.  Therapy evaluations in the morning.    Hypertension Well controlled at this time.    Type 2 DM, non insulin dependent  CBG (last 3)  Recent Labs    11/15/23 1702  GLUCAP 271*   A1c is 6.4% Continue with SSI while inpatient.    Mild AKI Unclear what his baseline creatinine is.  He was admitted with a creatinine of 2.4 with improvement to 1.9 with IV fluids.  Check Urine output. UA is negative for infection.  Check urine creatinine and if creatinine does not improve, please follow up with US kidneys.  Continue to monitor.     Normocytic anemia:  Baseline hemoglobin is around 10.  Monitor for post op blood loss.    Acute metabolic encephalopathy From AKI MRI brain without contrast does not show any intracranial abnormality.  Hiv Screen is negative.  No signs of infection in the urine.  Get CXR.   Estimated body mass index is 19.93 kg/m as calculated from the following:   Height as of  this encounter: 6' 0.01" (1.829 m).   Weight as of this encounter: 66.7 kg.  Code Status: full code.  DVT Prophylaxis:     Level of Care: Level of care: Telemetry Medical Family Communication: none at bedside.   Disposition Plan:     Remains inpatient appropriate:  further work up for the left femoral neck fracture.   Procedures:    Consultants:   Orthopedics Dr Eulah Pont.   Antimicrobials:   Anti-infectives (From admission, onward)    Start     Dose/Rate Route Frequency Ordered Stop   11/16/23 1130  ceFAZolin (ANCEF) IVPB 2g/100 mL premix        2 g 200 mL/hr over 30 Minutes Intravenous  Once 11/16/23 1115     11/16/23 1117  ceFAZolin (ANCEF) 2-4 GM/100ML-% IVPB       Note to Pharmacy: Crissie Sickles: cabinet override      11/16/23 1117 11/16/23 2329        Medications  Scheduled Meds:  [MAR Hold] atorvastatin  40  mg Oral q1800   [MAR Hold] calcium carbonate  1 tablet Oral BID WC   [MAR Hold] cholecalciferol  1,000 Units Oral Daily   [MAR Hold] mupirocin ointment  1 Application Nasal BID   [MAR Hold] sodium chloride flush  3 mL Intravenous Q12H   Continuous Infusions:  sodium chloride 10 mL/hr at 11/16/23 1012   acetaminophen     ceFAZolin      ceFAZolin (ANCEF) IV     tranexamic acid     tranexamic acid     tranexamic acid     PRN Meds:.acetaminophen, acetaminophen **OR** acetaminophen (TYLENOL) oral liquid 160 mg/5 mL, [MAR Hold] acetaminophen, ceFAZolin, fentaNYL (SUBLIMAZE) injection, [MAR Hold]  HYDROmorphone (DILAUDID) injection, insulin aspart, [MAR Hold] oxyCODONE **OR** [MAR Hold] oxyCODONE, oxyCODONE **OR** oxyCODONE, tranexamic acid, tranexamic acid    Subjective:   William Todd was seen and examined today.   No new events. Pain the left leg.   Objective:   Vitals:   11/16/23 0413 11/16/23 0917 11/16/23 0951 11/16/23 0958  BP: (!) 120/55 122/62 125/66   Pulse: 80  88   Resp: 18  18   Temp: 98.5 F (36.9 C) 98.4 F (36.9 C) 99 F (37.2  C)   TempSrc: Oral Oral    SpO2:  98% 97%   Weight:   66.7 kg 66.7 kg  Height:   6' (1.829 m) 6' 0.01" (1.829 m)    Intake/Output Summary (Last 24 hours) at 11/16/2023 1124 Last data filed at 11/16/2023 0500 Gross per 24 hour  Intake 0 ml  Output 350 ml  Net -350 ml   Filed Weights   11/16/23 0137 11/16/23 0951 11/16/23 0958  Weight: 67.8 kg 66.7 kg 66.7 kg     Exam General exam: Appears calm and comfortable  Respiratory system: Clear to auscultation. Respiratory effort normal. Cardiovascular system: S1 & S2 heard, RRR.  Gastrointestinal system: Abdomen is nondistended, soft and nontender.  Central nervous system: Alert and oriented to person.  Extremities: Painful ROM of the LLE.  Skin: No rashes,     Data Reviewed:  I have personally reviewed following labs and imaging studies   CBC Lab Results  Component Value Date   WBC 9.6 11/16/2023   RBC 2.89 (L) 11/16/2023   HGB 9.0 (L) 11/16/2023   HCT 26.2 (L) 11/16/2023   MCV 90.7 11/16/2023   MCH 31.1 11/16/2023   PLT 208 11/16/2023   MCHC 34.4 11/16/2023   RDW 12.3 11/16/2023   LYMPHSABS 0.7 11/15/2023   MONOABS 0.5 11/15/2023   EOSABS 0.1 11/15/2023   BASOSABS 0.1 11/15/2023     Last metabolic panel Lab Results  Component Value Date   NA 140 11/16/2023   K 4.1 11/16/2023   CL 105 11/16/2023   CO2 25 11/16/2023   BUN 40 (H) 11/16/2023   CREATININE 1.94 (H) 11/16/2023   GLUCOSE 121 (H) 11/16/2023   GFRNONAA 36 (L) 11/16/2023   GFRAA 87 (L) 01/02/2013   CALCIUM 9.1 11/16/2023   PHOS 2.9 11/16/2023   PROT 7.0 11/15/2023   ALBUMIN 2.9 (L) 11/15/2023   BILITOT 1.0 11/15/2023   ALKPHOS 83 11/15/2023   AST 45 (H) 11/15/2023   ALT 44 11/15/2023   ANIONGAP 10 11/16/2023    CBG (last 3)  Recent Labs    11/15/23 1702  GLUCAP 271*      Coagulation Profile: Recent Labs  Lab 11/16/23 0600  INR 1.3*     Radiology Studies: MR BRAIN WO CONTRAST Result Date: 11/15/2023  CLINICAL DATA:  Abnl CT Head,  evaluate for pontine infarct EXAM: MRI HEAD WITHOUT CONTRAST TECHNIQUE: Multiplanar, multiecho pulse sequences of the brain and surrounding structures were obtained without intravenous contrast. COMPARISON:  CT head from today. FINDINGS: Brain: No acute infarction, hemorrhage, hydrocephalus, extra-axial collection or mass lesion. Remote right PCA territory infarct. Moderate T2/FLAIR hyperintensities in the white matter, compatible with chronic microvascular ischemic disease. Vascular: Major arterial flow voids are maintained at the skull base. Skull and upper cervical spine: Normal marrow signal. Sinuses/Orbits: Negative. Other: No sizable mastoid effusions. IMPRESSION: 1. No evidence of acute intracranial abnormality. 2. Remote right PCA territory infarct. Electronically Signed   By: Feliberto Harts M.D.   On: 11/15/2023 22:38   CT Head Wo Contrast Result Date: 11/15/2023 CLINICAL DATA:  Altered mental status, fall EXAM: CT HEAD WITHOUT CONTRAST TECHNIQUE: Contiguous axial images were obtained from the base of the skull through the vertex without intravenous contrast. RADIATION DOSE REDUCTION: This exam was performed according to the departmental dose-optimization program which includes automated exposure control, adjustment of the mA and/or kV according to patient size and/or use of iterative reconstruction technique. COMPARISON:  None Available. FINDINGS: Brain: No acute territorial infarction, hemorrhage or intracranial mass. 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 Atrophy and advanced chronic small vessel ischemic changes of the white matter. Hypodensity at the pons, series 2, image 11 and sagittal series 6 image 26. Ventricles are nonenlarged Vascular: No hyperdense vessels. Carotid and vertebral calcifications. Skull: Normal. Negative for fracture or focal lesion. Sinuses/Orbits: No acute finding. Other: None IMPRESSION: 1. Negative for acute intracranial hemorrhage  or mass. 2. Hypodensity at the pons, though some of this may be related to artifact, the finding is rounded with some sparing of peripheral tissue and acute or subacute pontine infarct cannot be excluded. MRI is recommended for further evaluation. 3. Atrophy and chronic small vessel ischemic changes of the white matter Electronically Signed   By: Jasmine Pang M.D.   On: 11/15/2023 18:51   DG Ankle Complete Left Result Date: 11/15/2023 CLINICAL DATA:  Fall with ankle pain EXAM: LEFT ANKLE COMPLETE - 3+ VIEW COMPARISON:  01/30/2012 FINDINGS: Lateral plate and multiple fixating screws with additional medial malleolar fixating screws. Chronic postsurgical, posttraumatic and post infectious changes involving the distal tibia, distal fibula and tibiotalar joint. Fused appearance across the tibiotalar joint and probable subtalar fusion as well. No definite acute fracture is seen. IMPRESSION: Chronic postsurgical, posttraumatic and post infectious changes involving the distal tibia, distal fibula and tibiotalar joint. No definite acute osseous abnormality. Electronically Signed   By: Jasmine Pang M.D.   On: 11/15/2023 18:09   DG Foot Complete Left Result Date: 11/15/2023 CLINICAL DATA:  Larey Seat 2 weeks ago with left foot pain. EXAM: LEFT FOOT - COMPLETE 3+ VIEW COMPARISON:  Left ankle 01/30/2012 FINDINGS: Diffuse bone demineralization. Degenerative changes in the interphalangeal joints, metatarsal-phalangeal, tarsometatarsal, and interphalangeal joints. Postoperative fusion of the ankle joint with plate and screw fixation. No evidence of acute fracture or dislocation. Soft tissues are unremarkable. IMPRESSION: 1. No acute bony abnormalities. 2. Degenerative changes in the left foot. 3. Postoperative changes with surgical fusion of the left ankle including plate and screw fixation Electronically Signed   By: Burman Nieves M.D.   On: 11/15/2023 18:03   DG Hip Unilat W or Wo Pelvis 2-3 Views Left Result Date:  11/15/2023 CLINICAL DATA:  Fall 2 weeks ago. Dizziness with fall forward. Left foot pain. EXAM: DG HIP (WITH OR  WITHOUT PELVIS) 2-3V LEFT COMPARISON:  None Available. FINDINGS: Transverse subcapital fracture of the left femoral neck with varus angulation of the fracture fragments. No inter trochanteric involvement is identified. No dislocation at the hip joint. Pelvis appears intact. SI joints and symphysis pubis are not displaced. No destructive or expansile bone lesions. Degenerative changes in the lower lumbar spine and both hips. Vascular calcifications. IMPRESSION: Acute transverse fracture of the left femoral neck with varus angulation of the fracture fragments. Degenerative changes. Electronically Signed   By: Burman Nieves M.D.   On: 11/15/2023 18:02       Kathlen Mody M.D. [facility] 11/16/2023, 11:24 AM  Available via Epic secure chat 7am-7pm After 7 pm, please refer to night coverage provider listed on amion.

## 2023-11-16 NOTE — Op Note (Signed)
11/15/2023 - 11/16/2023  2:25 PM  PATIENT:  William Todd   MRN: [id number]  PRE-OPERATIVE DIAGNOSIS:  Left Hip Fracture  POST-OPERATIVE DIAGNOSIS:  Left Hip Fracture  PROCEDURE:  Procedure(s): ARTHROPLASTY BIPOLAR HIP (HEMIARTHROPLASTY)  PREOPERATIVE INDICATIONS:  ROBIE MCNIEL is an 73 y.o. male who was admitted 11/15/2023 with a diagnosis of Fracture of femoral neck, left, closed (HCC) and elected for surgical management.  The risks benefits and alternatives were discussed with the patient including but not limited to the risks of nonoperative treatment, versus surgical intervention including infection, bleeding, nerve injury, periprosthetic fracture, the need for revision surgery, dislocation, leg length discrepancy, blood clots, cardiopulmonary complications, morbidity, mortality, among others, and they were willing to proceed.  Predicted outcome is good, although there will be at least a six to nine month expected recovery.   OPERATIVE REPORT     SURGEON:  Margarita Rana, MD    ASSISTANT:  Levester Fresh, PA-C, he was present and scrubbed throughout the case, critical for completion in a timely fashion, and for retraction, instrumentation, and closure.     ANESTHESIA:  General    COMPLICATIONS:  None.      COMPONENTS:  [employer]: Femoral stem: 7, Femoral Head:51, Neck:-4   PROCEDURE IN DETAIL: The patient was met in the holding area and identified.  The appropriate hip  was marked at the operative site. The patient was then transported to the OR and  placed under general anesthesia.  At that point, the patient was  placed in the lateral decubitus position with the operative side up and  secured to the operating room table and all bony prominences padded.     The operative lower extremity was prepped from the iliac crest to the toes.  Sterile draping was performed.  Time out was performed prior to incision.      A routine posterolateral approach was utilized via sharp  dissection  carried down to the subcutaneous tissue.  Gross bleeders were Bovie  coagulated.  The iliotibial band was identified and incised  along the length of the skin incision.  Self-retaining retractors were  inserted. I examined the bursa there was significant hematoma and edema I performed a bursectomy here.  With the hip internally rotated, the short external rotators  were identified. The piriformis was tagged with FiberWire, and the hip capsule released in a T-type fashion.  The femoral neck was exposed, and I resected the femoral neck using the appropriate jig. This was performed at approximately a thumb's breadth above the lesser trochanter.    I then exposed the deep acetabulum, cleared out any tissue including the ligamentum teres, and included the hip capsule in the FiberWire used above and below the T.    I then prepared the proximal femur using the cookie-cutter, the lateralizing reamer, and then sequentially broached.  A trial utilized, and I reduced the hip and it was found to have excellent stability with functional range of motion. The trial components were then removed.   The canal and acetabulum were thoroughly irrigated  I inserted the pressfit stem and placed the head and neck collar. The hip was reduced with appropriate force and was stable through a range of motion.   I then used a 2 mm drill bits to pass the FiberWire suture from the capsule and puriform is through the greater trochanter, and secured this. Excellent posterior capsular repair was achieved. I also closed the T in the capsule.  I then irrigated the hip copiously  again with pulse lavage, and repaired the fascia with Vicryl, followed by Vicryl for the subcutaneous tissue, Monocryl for the skin, Steri-Strips and sterile gauze. The wounds were injected. The patient was then awakened and returned to PACU in stable and satisfactory condition. There were no complications.  POST-OP PLAN: Weight bearing as  tolerated. DVT px will consist of SCD's and chemical px  Margarita Rana, MD Orthopedic Surgeon [phone]   11/16/2023 2:25 PM

## 2023-11-16 NOTE — Consult Note (Signed)
ORTHOPAEDIC CONSULTATION  REQUESTING PHYSICIAN: Kathlen Mody, MD  Chief Complaint: left hip pain  HPI: William Todd is a 73 y.o. male with pain in the left hip after a fall at home 2 weeks ago. Has been unable to mobilize well since then. Wife brought him to the ER. He is unable to provide any details or history for reasons still unknown. No documented dementia. Did not know where he was, was he was in the hospital, that he was hurt from a fall, or who the president is. Did correctly state it was February 2025. Stroke workup in process.   Imaging shows an acute transverse fracture of the left femoral neck with varus angulation of the fracture fragments.    Orthopedics was consulted for evaluation.   Last meal yesterday. No listed history of MI, CVA, DVT, PE.  Previously ambulatory 2 weeks ago before fall.  The patient is living at home with his wife.    Past Medical History:  Diagnosis Date   Anxiety    new dx   Diabetes mellitus    type ii   History of radiation therapy 09/29/13- 11/25/13   prostate 7800 cGy 40 sessions, seminal vesicles 5600 cGy 40 sessions   Hypertension    Prostate cancer (HCC) 06/01/13   gleason 7   Past Surgical History:  Procedure Laterality Date   CARDIAC CATHETERIZATION  01/01/2013   FRACTURE SURGERY     left foot    LEFT HEART CATHETERIZATION WITH CORONARY ANGIOGRAM N/A 01/01/2013   Procedure: LEFT HEART CATHETERIZATION WITH CORONARY ANGIOGRAM;  Surgeon: Robynn Pane, MD;  Location: [facility]ATH [facility];  Service: Cardiovascular;  Laterality: N/A;   PROSTATE BIOPSY  06/01/13   Social History   Socioeconomic History   Marital status: Married    Spouse name: Not on file   Number of children: Not on file   Years of education: Not on file   Highest education level: Not on file  Occupational History   Not on file  Tobacco Use   Smoking status: Never   Smokeless tobacco: Never  Substance and Sexual Activity   Alcohol use: No    Comment: quit  years  ago   Drug use: No   Sexual activity: Not Currently  Other Topics Concern   Not on file  Social History Narrative   Not on file   Social Drivers of Health   Financial Resource Strain: Not on file  Food Insecurity: No Food Insecurity (11/16/2023)   Hunger Vital Sign    Worried About Running Out of Food in the Last Year: Never true    Ran Out of Food in the Last Year: Never true  Transportation Needs: No Transportation Needs (11/16/2023)   PRAPARE - Administrator, Civil Service (Medical): No    Lack of Transportation (Non-Medical): No  Physical Activity: Not on file  Stress: Not on file  Social Connections: Moderately Integrated (11/16/2023)   Social Connection and Isolation Panel [NHANES]    Frequency of Communication with Friends and Family: More than three times a week    Frequency of Social Gatherings with Friends and Family: More than three times a week    Attends Religious Services: More than 4 times per year    Active Member of [employer] or Organizations: No    Attends Banker Meetings: Never    Marital Status: Married   Family History  Problem Relation Age of Onset   Prostate cancer Brother  69       seed implant   No Known Allergies Prior to Admission medications   Medication Sig Start Date End Date Taking? Authorizing Provider  amLODipine (NORVASC) 10 MG tablet Take 0.5 tablets (5 mg total) by mouth daily. 01/03/13   Rinaldo Cloud, MD  aspirin EC 81 MG EC tablet Take 1 tablet (81 mg total) by mouth daily. 01/03/13   Rinaldo Cloud, MD  atorvastatin (LIPITOR) 80 MG tablet Take 0.5 tablets (40 mg total) by mouth daily at 6 PM. 01/03/13   Rinaldo Cloud, MD  clopidogrel (PLAVIX) 75 MG tablet  08/17/13   [provider]  escitalopram (LEXAPRO) 10 MG tablet Take 10 mg by mouth daily.    [provider]  famotidine (PEPCID) 20 MG tablet Take 1 tablet (20 mg total) by mouth 2 (two) times daily. 01/03/13   Rinaldo Cloud, MD  glimepiride  (AMARYL) 4 MG tablet  08/30/13   [provider]  lisinopril (PRINIVIL,ZESTRIL) 20 MG tablet Take 20 mg by mouth daily.    [provider]  metoprolol succinate (TOPROL-XL) 50 MG 24 hr tablet Take 1 tablet (50 mg total) by mouth daily. Take with or immediately following a meal. 01/03/13   Rinaldo Cloud, MD  nitroGLYCERIN (NITROSTAT) 0.4 MG SL tablet Place 1 tablet (0.4 mg total) under the tongue every 5 (five) minutes x 3 doses as needed for chest pain. 01/03/13   Rinaldo Cloud, MD  sitaGLIPtin-metformin (JANUMET) 50-1000 MG per tablet Take 1 tablet by mouth 2 (two) times daily with a meal.    [provider]   MR BRAIN WO CONTRAST Result Date: 11/15/2023 CLINICAL DATA:  Abnl CT Head, evaluate for pontine infarct EXAM: MRI HEAD WITHOUT CONTRAST TECHNIQUE: Multiplanar, multiecho pulse sequences of the brain and surrounding structures were obtained without intravenous contrast. COMPARISON:  CT head from today. FINDINGS: Brain: No acute infarction, hemorrhage, hydrocephalus, extra-axial collection or mass lesion. Remote right PCA territory infarct. Moderate T2/FLAIR hyperintensities in the white matter, compatible with chronic microvascular ischemic disease. Vascular: Major arterial flow voids are maintained at the skull base. Skull and upper cervical spine: Normal marrow signal. Sinuses/Orbits: Negative. Other: No sizable mastoid effusions. IMPRESSION: 1. No evidence of acute intracranial abnormality. 2. Remote right PCA territory infarct. Electronically Signed   By: Feliberto Harts M.D.   On: 11/15/2023 22:38   CT Head Wo Contrast Result Date: 11/15/2023 CLINICAL DATA:  Altered mental status, fall EXAM: CT HEAD WITHOUT CONTRAST TECHNIQUE: Contiguous axial images were obtained from the base of the skull through the vertex without intravenous contrast. RADIATION DOSE REDUCTION: This exam was performed according to the departmental dose-optimization program which includes automated  exposure control, adjustment of the mA and/or kV according to patient size and/or use of iterative reconstruction technique. COMPARISON:  None Available. FINDINGS: Brain: No acute territorial infarction, hemorrhage or intracranial mass. 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 Atrophy and advanced chronic small vessel ischemic changes of the white matter. Hypodensity at the pons, series 2, image 11 and sagittal series 6 image 26. Ventricles are nonenlarged Vascular: No hyperdense vessels. Carotid and vertebral calcifications. Skull: Normal. Negative for fracture or focal lesion. Sinuses/Orbits: No acute finding. Other: None IMPRESSION: 1. Negative for acute intracranial hemorrhage or mass. 2. Hypodensity at the pons, though some of this may be related to artifact, the finding is rounded with some sparing of peripheral tissue and acute or subacute pontine infarct cannot be excluded. MRI is recommended for further evaluation.  3. Atrophy and chronic small vessel ischemic changes of the white matter Electronically Signed   By: Jasmine Pang M.D.   On: 11/15/2023 18:51   DG Ankle Complete Left Result Date: 11/15/2023 CLINICAL DATA:  Fall with ankle pain EXAM: LEFT ANKLE COMPLETE - 3+ VIEW COMPARISON:  01/30/2012 FINDINGS: Lateral plate and multiple fixating screws with additional medial malleolar fixating screws. Chronic postsurgical, posttraumatic and post infectious changes involving the distal tibia, distal fibula and tibiotalar joint. Fused appearance across the tibiotalar joint and probable subtalar fusion as well. No definite acute fracture is seen. IMPRESSION: Chronic postsurgical, posttraumatic and post infectious changes involving the distal tibia, distal fibula and tibiotalar joint. No definite acute osseous abnormality. Electronically Signed   By: Jasmine Pang M.D.   On: 11/15/2023 18:09   DG Foot Complete Left Result Date: 11/15/2023 CLINICAL DATA:  Larey Seat 2 weeks ago  with left foot pain. EXAM: LEFT FOOT - COMPLETE 3+ VIEW COMPARISON:  Left ankle 01/30/2012 FINDINGS: Diffuse bone demineralization. Degenerative changes in the interphalangeal joints, metatarsal-phalangeal, tarsometatarsal, and interphalangeal joints. Postoperative fusion of the ankle joint with plate and screw fixation. No evidence of acute fracture or dislocation. Soft tissues are unremarkable. IMPRESSION: 1. No acute bony abnormalities. 2. Degenerative changes in the left foot. 3. Postoperative changes with surgical fusion of the left ankle including plate and screw fixation Electronically Signed   By: Burman Nieves M.D.   On: 11/15/2023 18:03   DG Hip Unilat W or Wo Pelvis 2-3 Views Left Result Date: 11/15/2023 CLINICAL DATA:  Fall 2 weeks ago. Dizziness with fall forward. Left foot pain. EXAM: DG HIP (WITH OR WITHOUT PELVIS) 2-3V LEFT COMPARISON:  None Available. FINDINGS: Transverse subcapital fracture of the left femoral neck with varus angulation of the fracture fragments. No inter trochanteric involvement is identified. No dislocation at the hip joint. Pelvis appears intact. SI joints and symphysis pubis are not displaced. No destructive or expansile bone lesions. Degenerative changes in the lower lumbar spine and both hips. Vascular calcifications. IMPRESSION: Acute transverse fracture of the left femoral neck with varus angulation of the fracture fragments. Degenerative changes. Electronically Signed   By: Burman Nieves M.D.   On: 11/15/2023 18:02    Positive ROS: All other systems have been reviewed and were otherwise negative with the exception of those mentioned in the HPI and as above.  Objective: Labs cbc Recent Labs    11/15/23 1720 11/16/23 0600  WBC 12.6* 9.6  HGB 10.1* 9.0*  HCT 30.7* 26.2*  PLT 245 208    Labs inflam No results for input(s): "CRP" in the last 72 hours.  Invalid input(s): "ESR"  Labs coag Recent Labs    11/16/23 0600  INR 1.3*    Recent Labs     11/15/23 1720  NA 137  K 4.5  CL 100  CO2 22  GLUCOSE 323*  BUN 45*  CREATININE 2.49*  CALCIUM 9.4    Physical Exam: Vitals:   11/16/23 0137 11/16/23 0413  BP: 132/65 (!) 120/55  Pulse: 88 80  Resp:  18  Temp: (!) 97.1 F (36.2 C) 98.5 F (36.9 C)  SpO2: 100%    General: Pleasantly confused, sleeping but easily awoken, calm, no acute distress.  Mental status: Alert and Oriented x2 Neurologic: Speech Clear and organized, no gross focal findings or movement disorder appreciated. Respiratory: No cyanosis, no use of accessory musculature Cardiovascular: No pedal edema GI: Abdomen is soft and non-tender, non-distended. Skin: Warm and dry.  No lesions  in the area of chief complaint. Extremities: Warm and well perfused w/o edema Psychiatric: Patient is not competent for consent  MUSCULOSKELETAL:  Mild TTP left hip, patient is laying in the left lateral decubitus position with hips and knees flexed, no edema or ecchymosis  seen, ROM intact to left ankle, calf compressible, NVI  Other extremities are atraumatic with painless ROM and NVI.  Assessment / Plan: Principal Problem:   Fracture of femoral neck, left, closed (HCC) Active Problems:   Abnormal head CT   Memory deficit   AKI (acute kidney injury) (HCC)   Will plan to take patient to OR today for left hip hemi arthroplasty. Will need to speak with patient's wife to obtain consent.    Keep NPO.   Jenne Pane PA-C Office [phone] 11/16/2023 7:36 AM

## 2023-11-16 NOTE — Plan of Care (Signed)
   Problem: Education: Goal: Knowledge of General Education information will improve Description: Including pain rating scale, medication(s)/side effects and non-pharmacologic comfort measures Outcome: Progressing   Problem: Coping: Goal: Level of anxiety will decrease Outcome: Progressing

## 2023-11-16 NOTE — ED Notes (Signed)
Patient removing cardiac monitor; found patient to have had a small BM. Pericare and brief changed. Placed back on monitor. Condom cath placed.

## 2023-11-16 NOTE — Anesthesia Postprocedure Evaluation (Signed)
Anesthesia Post Note  Patient: William Todd  Procedure(s) Performed: ARTHROPLASTY BIPOLAR HIP (HEMIARTHROPLASTY) (Left: Hip)     Patient location during evaluation: PACU Anesthesia Type: MAC and Spinal Level of consciousness: awake, confused and patient cooperative Pain management: pain level controlled Vital Signs Assessment: post-procedure vital signs reviewed and stable Respiratory status: spontaneous breathing, nonlabored ventilation and respiratory function stable Cardiovascular status: stable and blood pressure returned to baseline Postop Assessment: no apparent nausea or vomiting Anesthetic complications: no   There were no known notable events for this encounter.  Last Vitals:  Vitals:   11/16/23 1530 11/16/23 1545  BP: 120/67 126/72  Pulse:  75  Resp:  18  Temp:  (!) 36.3 C  SpO2:  96%    Last Pain:  Vitals:   11/16/23 1526  TempSrc:   PainSc: 0-No pain                 Devaney Segers

## 2023-11-16 NOTE — Anesthesia Procedure Notes (Signed)
Spinal  Patient location during procedure: OR Start time: 11/16/2023 1:10 PM End time: 11/16/2023 1:18 PM Reason for block: surgical anesthesia Staffing Performed: anesthesiologist  Anesthesiologist: Val Eagle, MD Performed by: Val Eagle, MD Authorized by: Val Eagle, MD   Preanesthetic Checklist Completed: patient identified, IV checked, risks and benefits discussed, surgical consent, monitors and equipment checked, pre-op evaluation and timeout performed Spinal Block Patient position: right lateral decubitus Prep: DuraPrep Patient monitoring: heart rate, cardiac monitor, continuous pulse ox and blood pressure Approach: midline Location: L3-4 Injection technique: single-shot Needle Needle type: Pencan  Needle gauge: 24 G Needle length: 9 cm Assessment Sensory level: T6 Events: CSF return

## 2023-11-16 NOTE — Discharge Instructions (Signed)

## 2023-11-16 NOTE — Transfer of Care (Signed)
Immediate Anesthesia Transfer of Care Note  Patient: William Todd  Procedure(s) Performed: ARTHROPLASTY BIPOLAR HIP (HEMIARTHROPLASTY) (Left: Hip)  Patient Location: PACU  Anesthesia Type:MAC and Regional  Level of Consciousness: awake  Airway & Oxygen Therapy: Patient Spontanous Breathing  Post-op Assessment: Report given to RN  Post vital signs: Reviewed and stable  Last Vitals:  Vitals Value Taken Time  BP 62/50 11/16/23 1530  Temp    Pulse 71 11/16/23 1532  Resp 13 11/16/23 1532  SpO2 97 % 11/16/23 1532  Vitals shown include unfiled device data.  Last Pain:  Vitals:   11/16/23 0917  TempSrc: Oral  PainSc:          Complications: There were no known notable events for this encounter.

## 2023-11-16 NOTE — Progress Notes (Signed)
PT Cancellation Note  Patient Details Name: William Todd MRN: [id number] DOB: August 02, 1951   Cancelled Treatment:    Reason Eval/Treat Not Completed: Other (comment)  Currently in the OR;  Will follow up for PT Eval tomorrow;   Van Clines, PT  Acute Rehabilitation Services Office [phone] Secure Chat welcomed    [name] 11/16/2023, 12:58 PM

## 2023-11-16 NOTE — Plan of Care (Signed)

## 2023-11-17 DIAGNOSIS — N179 Acute kidney failure, unspecified: Secondary | ICD-10-CM | POA: Diagnosis not present

## 2023-11-17 DIAGNOSIS — S72002D Fracture of unspecified part of neck of left femur, subsequent encounter for closed fracture with routine healing: Secondary | ICD-10-CM | POA: Diagnosis not present

## 2023-11-17 DIAGNOSIS — R413 Other amnesia: Secondary | ICD-10-CM | POA: Diagnosis not present

## 2023-11-17 DIAGNOSIS — R930 Abnormal findings on diagnostic imaging of skull and head, not elsewhere classified: Secondary | ICD-10-CM | POA: Diagnosis not present

## 2023-11-17 LAB — IRON AND TIBC
Iron: 36 ug/dL — ABNORMAL LOW (ref 45–182)
Saturation Ratios: 22 % (ref 17.9–39.5)
TIBC: 164 ug/dL — ABNORMAL LOW (ref 250–450)
UIBC: 128 ug/dL

## 2023-11-17 LAB — GLUCOSE, CAPILLARY
Glucose-Capillary: 91 mg/dL (ref 70–99)
Glucose-Capillary: 129 mg/dL — ABNORMAL HIGH (ref 70–99)
Glucose-Capillary: 165 mg/dL — ABNORMAL HIGH (ref 70–99)
Glucose-Capillary: 179 mg/dL — ABNORMAL HIGH (ref 70–99)

## 2023-11-17 LAB — RENAL FUNCTION PANEL
Albumin: 2.2 g/dL — ABNORMAL LOW (ref 3.5–5.0)
Anion gap: 8 (ref 5–15)
BUN: 18 mg/dL (ref 8–23)
CO2: 21 mmol/L — ABNORMAL LOW (ref 22–32)
Calcium: 7.8 mg/dL — ABNORMAL LOW (ref 8.9–10.3)
Chloride: 108 mmol/L (ref 98–111)
Creatinine, Ser: 1.2 mg/dL (ref 0.61–1.24)
GFR, Estimated: >60 mL/min (ref >60)
Glucose, Bld: 103 mg/dL — ABNORMAL HIGH (ref 70–99)
Phosphorus: 2.8 mg/dL (ref 2.5–4.6)
Potassium: 4.1 mmol/L (ref 3.5–5.1)
Sodium: 137 mmol/L (ref 135–145)

## 2023-11-17 LAB — CBC
HCT: 22.6 % — ABNORMAL LOW (ref 39.0–52.0)
Hemoglobin: 7.7 g/dL — ABNORMAL LOW (ref 13.0–17.0)
MCH: 30.6 pg (ref 26.0–34.0)
MCHC: 34.1 g/dL (ref 30.0–36.0)
MCV: 89.7 fL (ref 80.0–100.0)
Platelets: 196 10*3/uL (ref 150–400)
RBC: 2.52 MIL/uL — ABNORMAL LOW (ref 4.22–5.81)
RDW: 12.3 % (ref 11.5–15.5)
WBC: 11.1 10*3/uL — ABNORMAL HIGH (ref 4.0–10.5)
nRBC: 0 % (ref 0.0–0.2)

## 2023-11-17 LAB — RPR: RPR Ser Ql: NONREACTIVE

## 2023-11-17 LAB — RETICULOCYTES
Immature Retic Fract: 15.3 % (ref 2.3–15.9)
RBC.: 2.57 MIL/uL — ABNORMAL LOW (ref 4.22–5.81)
Retic Count, Absolute: 61.2 10*3/uL (ref 19.0–186.0)
Retic Ct Pct: 2.4 % (ref 0.4–3.1)

## 2023-11-17 LAB — FOLATE: Folate: 8.9 ng/mL (ref >5.9)

## 2023-11-17 LAB — VITAMIN B12: Vitamin B-12: 731 pg/mL (ref 180–914)

## 2023-11-17 LAB — FERRITIN: Ferritin: 329 ng/mL (ref 24–336)

## 2023-11-17 LAB — MAGNESIUM: Magnesium: 2 mg/dL (ref 1.7–2.4)

## 2023-11-17 MED ORDER — HYDROCODONE-ACETAMINOPHEN 7.5-325 MG PO TABS: 1.0000 | ORAL_TABLET | ORAL | Status: DC | PRN

## 2023-11-17 MED ORDER — OXYCODONE HCL 5 MG PO TABS: 5.0000 mg | ORAL_TABLET | Freq: Four times a day (QID) | ORAL | Status: DC | PRN

## 2023-11-17 MED ORDER — TRAMADOL HCL 50 MG PO TABS: 50.0000 mg | ORAL_TABLET | Freq: Three times a day (TID) | ORAL | Status: DC | PRN

## 2023-11-17 MED ORDER — METOCLOPRAMIDE HCL 5 MG/ML IJ SOLN
5.0000 mg | Freq: Three times a day (TID) | INTRAMUSCULAR | Status: DC | PRN
Start: 2023-11-17 — End: 2023-11-19

## 2023-11-17 MED ORDER — INSULIN ASPART 100 UNIT/ML IJ SOLN: 0.0000 [IU] | Freq: Every day | INTRAMUSCULAR | Status: DC

## 2023-11-17 MED ORDER — DOCUSATE SODIUM 100 MG PO CAPS
100.0000 mg | ORAL_CAPSULE | Freq: Two times a day (BID) | ORAL | Status: DC
  Administered 2023-11-17 – 2023-11-20 (×7): 100 mg via ORAL
  Filled 2023-11-17 (×7): qty 1

## 2023-11-17 MED ORDER — MENTHOL 3 MG MT LOZG: 1.0000 | LOZENGE | OROMUCOSAL | Status: DC | PRN

## 2023-11-17 MED ORDER — ESCITALOPRAM OXALATE 10 MG PO TABS: 10.0000 mg | ORAL_TABLET | Freq: Every day | ORAL | Status: DC

## 2023-11-17 MED ORDER — CEFAZOLIN SODIUM-DEXTROSE 2-4 GM/100ML-% IV SOLN
2.0000 g | Freq: Four times a day (QID) | INTRAVENOUS | Status: AC
  Administered 2023-11-17 (×2): 2 g via INTRAVENOUS
  Filled 2023-11-17 (×2): qty 100

## 2023-11-17 MED ORDER — HYDROCODONE-ACETAMINOPHEN 5-325 MG PO TABS: 1.0000 | ORAL_TABLET | ORAL | Status: DC | PRN

## 2023-11-17 MED ORDER — ACETAMINOPHEN 325 MG PO TABS
650.0000 mg | ORAL_TABLET | Freq: Four times a day (QID) | ORAL | Status: DC
  Administered 2023-11-17 – 2023-11-20 (×13): 650 mg via ORAL
  Filled 2023-11-17 (×13): qty 2

## 2023-11-17 MED ORDER — ASPIRIN 81 MG PO TBEC
81.0000 mg | DELAYED_RELEASE_TABLET | Freq: Every day | ORAL | Status: DC
  Administered 2023-11-17: 81 mg via ORAL
  Filled 2023-11-17: qty 1

## 2023-11-17 MED ORDER — PHENOL 1.4 % MT LIQD: 1.0000 | OROMUCOSAL | Status: DC | PRN

## 2023-11-17 MED ORDER — ONDANSETRON HCL 4 MG PO TABS: 4.0000 mg | ORAL_TABLET | Freq: Four times a day (QID) | ORAL | Status: DC | PRN

## 2023-11-17 MED ORDER — TRANEXAMIC ACID-NACL 1000-0.7 MG/100ML-% IV SOLN
1000.0000 mg | Freq: Once | INTRAVENOUS | Status: AC
  Administered 2023-11-17: 1000 mg via INTRAVENOUS
  Filled 2023-11-17: qty 100

## 2023-11-17 MED ORDER — MORPHINE SULFATE (PF) 2 MG/ML IV SOLN: 0.5000 mg | INTRAVENOUS | Status: DC | PRN

## 2023-11-17 MED ORDER — ACETAMINOPHEN 325 MG PO TABS: 325.0000 mg | ORAL_TABLET | Freq: Four times a day (QID) | ORAL | Status: DC | PRN

## 2023-11-17 MED ORDER — METHOCARBAMOL 500 MG PO TABS
500.0000 mg | ORAL_TABLET | Freq: Four times a day (QID) | ORAL | Status: DC | PRN
  Administered 2023-11-17 – 2023-11-18 (×3): 500 mg via ORAL
  Filled 2023-11-17 (×3): qty 1

## 2023-11-17 MED ORDER — ALUM & MAG HYDROXIDE-SIMETH 200-200-20 MG/5ML PO SUSP
30.0000 mL | ORAL | Status: DC | PRN
Start: 2023-11-17 — End: 2023-11-20

## 2023-11-17 MED ORDER — POLYETHYLENE GLYCOL 3350 17 G PO PACK
17.0000 g | PACK | Freq: Every day | ORAL | Status: DC | PRN
Start: 2023-11-17 — End: 2023-11-20
  Administered 2023-11-19: 17 g via ORAL
  Filled 2023-11-17: qty 1

## 2023-11-17 MED ORDER — METOCLOPRAMIDE HCL 5 MG PO TABS
5.0000 mg | ORAL_TABLET | Freq: Three times a day (TID) | ORAL | Status: DC | PRN
Start: 2023-11-17 — End: 2023-11-19

## 2023-11-17 MED ORDER — ACETAMINOPHEN 500 MG PO TABS
500.0000 mg | ORAL_TABLET | Freq: Four times a day (QID) | ORAL | Status: DC
  Administered 2023-11-17 (×2): 500 mg via ORAL
  Filled 2023-11-17 (×3): qty 1

## 2023-11-17 MED ORDER — METHOCARBAMOL 1000 MG/10ML IJ SOLN: 500.0000 mg | Freq: Four times a day (QID) | INTRAMUSCULAR | Status: DC | PRN

## 2023-11-17 MED ORDER — PANTOPRAZOLE SODIUM 40 MG PO TBEC
40.0000 mg | DELAYED_RELEASE_TABLET | Freq: Every day | ORAL | Status: DC
  Administered 2023-11-17 – 2023-11-20 (×4): 40 mg via ORAL
  Filled 2023-11-17 (×4): qty 1

## 2023-11-17 MED ORDER — ONDANSETRON HCL 4 MG/2ML IJ SOLN
4.0000 mg | Freq: Four times a day (QID) | INTRAMUSCULAR | Status: DC | PRN
Start: 2023-11-17 — End: 2023-11-20

## 2023-11-17 MED ORDER — BISACODYL 10 MG RE SUPP: 10.0000 mg | Freq: Every day | RECTAL | Status: DC | PRN

## 2023-11-17 MED ORDER — INSULIN ASPART 100 UNIT/ML IJ SOLN
0.0000 [IU] | Freq: Three times a day (TID) | INTRAMUSCULAR | Status: DC
  Administered 2023-11-17: 2 [IU] via SUBCUTANEOUS
  Administered 2023-11-18: 3 [IU] via SUBCUTANEOUS
  Administered 2023-11-18: 1 [IU] via SUBCUTANEOUS
  Administered 2023-11-19: 3 [IU] via SUBCUTANEOUS

## 2023-11-17 NOTE — Evaluation (Signed)
Physical Therapy Evaluation  Patient Details Name: William Todd MRN: [id number] DOB: 10/16/1950 Today's Date: 11/17/2023  History of Present Illness  Pt is a 73 y/o male who presents 11/15/2023 after fall 2 weeks ago and inability to ambulate since that time. Pt had been refusing to be evaluated but wife brought pt into the ED. He was found to have an acute transverse fracture of the L femoral neck and is now s/p L hemiarthroplasty on 11/16/2023. PMH significant for anxiety, CKD, DM II, prostate CA s/p radiation, HTN, L foot fracture surgery (unknown date).   Clinical Impression  Pt admitted with above diagnosis. Pt currently with functional limitations due to the deficits listed below (see PT Problem List). At the time of PT eval pt does not complain of pain and was able to demonstrate transfers and ambulation with CGA to min assist and RW for support. Pt is a poor historian, and unable to get in touch with wife via phone call after session. Anticipate pt is mobilizing better now vs prior to surgery. From a cognitive standpoint, pt will likely do better at home vs a rehab facility. If wife can provide the assistance pt currently needs, recommend post-acute therapy in the home setting. Will need to confirm with wife and complete stair training prior to d/c. Pt will benefit from acute skilled PT to increase their independence and safety with mobility to allow discharge.           If plan is discharge home, recommend the following: A little help with walking and/or transfers;A little help with bathing/dressing/bathroom;Assistance with cooking/housework;Direct supervision/assist for medications management;Direct supervision/assist for financial management;Assist for transportation;Help with stairs or ramp for entrance;Supervision due to cognitive status   Can travel by private vehicle        Equipment Recommendations Rolling walker (2 wheels)  Recommendations for Other Services       Functional  Status Assessment Patient has had a recent decline in their functional status and demonstrates the ability to make significant improvements in function in a reasonable and predictable amount of time.     Precautions / Restrictions Precautions Precautions: Fall;Posterior Hip Precaution Booklet Issued: No Precaution Comments: Verbally reviewed precautions during functional mobility. Needs handout. Restrictions Weight Bearing Restrictions Per Provider Order: Yes LLE Weight Bearing Per Provider Order: Weight bearing as tolerated      Mobility  Bed Mobility Overal bed mobility: Needs Assistance Bed Mobility: Supine to Sit     Supine to sit: Min assist     General bed mobility comments: Light assist to transition to EOB. Multimodal cues for pt to scoot around and get both feet on the floor.    Transfers Overall transfer level: Needs assistance Equipment used: Rolling walker (2 wheels) Transfers: Sit to/from Stand Sit to Stand: Contact guard assist           General transfer comment: VC's for hand placement on seated surface for safety. No physical assist required however hands on guarding provided throughout for safety.    Ambulation/Gait Ambulation/Gait assistance: Contact guard assist, +2 safety/equipment Gait Distance (Feet): 40 Feet Assistive device: Rolling walker (2 wheels) Gait Pattern/deviations: Step-through pattern, Decreased stride length, Trunk flexed Gait velocity: Decreased Gait velocity interpretation: <1.8 ft/sec, indicate of risk for recurrent falls   General Gait Details: Pt ambulating without complaints of pain. Chair follow utilized for safety. No knee buckling or overt LOB noted.  Stairs            Psychologist, prison and probation services  Tilt Bed    Modified Rankin (Stroke Patients Only)       Balance Overall balance assessment: Needs assistance Sitting-balance support: Feet supported, No upper extremity supported Sitting balance-Leahy Scale: Fair      Standing balance support: Bilateral upper extremity supported, During functional activity, Reliant on assistive device for balance Standing balance-Leahy Scale: Poor                               Pertinent Vitals/Pain Pain Assessment Pain Assessment: No/denies pain    Home Living Family/patient expects to be discharged to:: Private residence Living Arrangements: Spouse/significant other Available Help at Discharge: Family Type of Home: House         Home Layout: Two level   Additional Comments: Pt is a poor historian. Initially reports he lives alone, and then mentions his wife. When pressed for information, pt reports she is there all the time at home, but is not able to provide any further details. Assume stairs at home 2 mechanism of injury.    Prior Function               Mobility Comments: Pt states he uses a cane; noted a shoe with an AFO and a CAM boot present in the room. Pt reports both are for the LLE.       Extremity/Trunk Assessment   Upper Extremity Assessment Upper Extremity Assessment: Defer to OT evaluation    Lower Extremity Assessment Lower Extremity Assessment: Generalized weakness;LLE deficits/detail LLE Deficits / Details: Decreased strength and AROM consistent with pre-op diagnosis and subsequent surgery. LLE: Unable to fully assess due to pain    Cervical / Trunk Assessment Cervical / Trunk Assessment: Other exceptions Cervical / Trunk Exceptions: Forward head posture with rounded shoulders  Communication   Communication Communication: Difficulty communicating thoughts/reduced clarity of speech Cueing Techniques: Verbal cues;Gestural cues;Tactile cues  Cognition Arousal: Alert Behavior During Therapy: Flat affect Overall Cognitive Status: Impaired/Different from baseline Area of Impairment: Orientation, Attention, Memory, Following commands, Safety/judgement, Awareness, Problem solving                 Orientation  Level: Disoriented to, Situation Current Attention Level: Sustained Memory: Decreased short-term memory, Decreased recall of precautions Following Commands: Follows one step commands consistently, Follows multi-step commands inconsistently Safety/Judgement: Decreased awareness of safety, Decreased awareness of deficits Awareness: Intellectual Problem Solving: Slow processing, Decreased initiation, Difficulty sequencing, Requires verbal cues General Comments: Pt initially reporting he lives alone and then mentions his wife. Pt closing his R eye throughout the session, states he does not have his glasses. Noted significant difficulty finding objects on his tray. Reports he can see well down the hallway but his gaze did not appear to be focused on anything specific.        General Comments      Exercises     Assessment/Plan    PT Assessment Patient needs continued PT services  PT Problem List Decreased strength;Decreased activity tolerance;Decreased balance;Decreased range of motion;Decreased mobility;Decreased cognition;Decreased knowledge of use of DME;Decreased safety awareness;Decreased knowledge of precautions;Pain       PT Treatment Interventions DME instruction;Gait training;Stair training;Functional mobility training;Therapeutic activities;Therapeutic exercise;Balance training;Patient/family education    PT Goals (Current goals can be found in the Care Plan section)  Acute Rehab PT Goals Patient Stated Goal: None stated PT Goal Formulation: With patient Time For Goal Achievement: 12/01/23 Potential to Achieve Goals: Good    Frequency Min 1X/week  Co-evaluation               AM-PAC PT "6 Clicks" Mobility  Outcome Measure Help needed turning from your back to your side while in a flat bed without using bedrails?: A Little Help needed moving from lying on your back to sitting on the side of a flat bed without using bedrails?: A Little Help needed moving to and  from a bed to a chair (including a wheelchair)?: A Little Help needed standing up from a chair using your arms (e.g., wheelchair or bedside chair)?: A Little Help needed to walk in hospital room?: A Little Help needed climbing 3-5 steps with a railing? : A Little 6 Click Score: 18    End of Session Equipment Utilized During Treatment: Gait belt Activity Tolerance: Patient tolerated treatment well Patient left: in chair;with call bell/phone within reach;with chair alarm set Nurse Communication: Mobility status;Other (comment) (Awaiting batteries to be delivered to unit for chair alarm box. NT aware pt up in chair and will bring in alarm box when batteries arrive.) PT Visit Diagnosis: Unsteadiness on feet (R26.81);History of falling (Z91.81);Difficulty in walking, not elsewhere classified (R26.2)    Time: 1610-9604 PT Time Calculation (min) (ACUTE ONLY): 30 min   Charges:   PT Evaluation $PT Eval Moderate Complexity: 1 Mod PT Treatments $Gait Training: 8-22 mins PT General Charges $$ ACUTE PT VISIT: 1 Visit         Conni Slipper, PT, DPT Acute Rehabilitation Services Secure Chat Preferred Office: [phone]   Marylynn Pearson 11/17/2023, 3:55 PM

## 2023-11-17 NOTE — Progress Notes (Addendum)
PROGRESS NOTE  William Todd NWG:[id number] DOB: 24-Jul-1951   PCP: Knox Royalty, MD  Patient is from: Lives with wife.  Independently ambulates at baseline.  DOA: 11/15/2023 LOS: 2  Chief complaints Chief Complaint  Patient presents with   Fall     Brief Narrative / Interim history: 73 year old M with PMH of DM-2, prostate cancer, HTN, HLD and cognitive impairment brought to ED due to inability to ambulate after he sustained a fall about 2 weeks prior.  Patient has been refusing to be evaluated but wife had enough and decided to bring him to the hospital. He has significant memory impairment regarding short-term events.  X-ray showed an acute transverse fracture of the left femoral neck with varus angulation of the fracture fragments.  Orthopedics consulted.  He underwent hemiarthroplasty on 2/2.  He also had AKI that has resolved.  No postop complication.  Subjective: Seen and examined earlier this morning.  No complaints.  He denies pain.  He is awake and alert and oriented x 4 except date.  Follows commands.  When asked about the day, he stated "the day I go home".   Objective: Vitals:   11/16/23 1639 11/16/23 1932 11/17/23 0515 11/17/23 0723  BP: 116/80 123/66 116/61 (!) 97/49  Pulse: 76 84 70 83  Resp: 18 19 17 17   Temp: 97.6 F (36.4 C) 98 F (36.7 C) 98.2 F (36.8 C) 98.4 F (36.9 C)  TempSrc: Oral Oral Axillary Oral  SpO2: 97% 98% 97% 98%  Weight:      Height:        Examination:  GENERAL: No apparent distress.  Nontoxic. HEENT: MMM.  Vision and hearing grossly intact.  NECK: Supple.  No apparent JVD.  RESP:  No IWOB.  Fair aeration bilaterally. CVS:  RRR. Heart sounds normal.  ABD/GI/GU: BS+. Abd soft, NTND.  MSK/EXT:  Moves extremities.  Chronic left ankle deformity.  Dressing over left hip DCI. SKIN: As above. NEURO: Awake and alert.  Oriented x 4 except date.  No apparent focal neuro deficit. PSYCH: Calm. Normal affect.   Procedures:  2/2-left hip  hemiarthroplasty by Dr. Eulah Pont  Microbiology summarized: Surgical MRSA PCR negative.  Assessment and plan: Accidental fall at home-fell about 2 weeks ago and was not able to ambulate due to pain. Traumatic left femoral neck fracture due to accidental fall at home.  X-ray confirmed fracture.  Vitamin D level 52. -S/p left hemiarthroplasty on 2/2 -Pain control and VTE prophylaxis per surgery. -PT/OT eval -Fall precaution     Essential hypertension: Soft BP this morning despite holding home antihypertensive meds. -Recheck BP  -Continue holding home antihypertensive meds    AKI: Resolved. Recent Labs    11/15/23 1720 11/16/23 0600 11/17/23 0743  BUN 45* 40* 18  CREATININE 2.49* 1.94* 1.20  -Continue monitoring -Avoid nephrotoxic meds  NIDDM-2: A1c 6.4%.  CBG within normal range of Recent Labs  Lab 11/15/23 1702 11/16/23 0950 11/16/23 1153 11/16/23 1545  GLUCAP 271* 91 80 89  -Start SSI-sensitive with night coverage. -Hold home glimepiride   Normocytic anemia: Hgb dropped about 2 g postoperatively.  No report of EBL.  Unknown baseline.  Anemia panel consistent with anemia of chronic disease. Recent Labs    11/15/23 1720 11/16/23 0600 11/17/23 0904  HGB 10.1* 9.0* 7.7*  -Monitor H&H.  Transfuse for Hgb less than 7.0.  No history of CAD per wife.   -Wife verbally consented for blood transfusion when needed. -Check anemia panel.  Acute metabolic encephalopathy: Seems  to have resolved.  MRI brain without acute finding.  He is currently awake and alert and oriented x 4 except date.  Has underlying cognitive impairment. -Reorientation and delirium precaution -Minimize or avoid sedating medications.  Mood disorder: Stable. -Resume home Lexapro  Nutrition Body mass index is 19.93 kg/m. -Consult dietitian          DVT prophylaxis:  SCDs Start: 11/17/23 0034  Code Status: Full code Family Communication: Updated patient's wife at the bedside. Level of care:  Med-Surg Status is: Inpatient Remains inpatient appropriate because: Left hip fracture, AKI   Final disposition: Likely SNF Consultants:  Orthopedic surgery  55 minutes with more than 50% spent in reviewing records, counseling patient/family and coordinating care.   Sch Meds:  Scheduled Meds:  acetaminophen  650 mg Oral Q6H WA   aspirin EC  81 mg Oral Daily   atorvastatin  40 mg Oral q1800   calcium carbonate  1 tablet Oral BID WC   cholecalciferol  1,000 Units Oral Daily   docusate sodium  100 mg Oral BID   escitalopram  10 mg Oral Daily   insulin aspart  0-5 Units Subcutaneous QHS   insulin aspart  0-9 Units Subcutaneous TID WC   mupirocin ointment  1 Application Nasal BID   pantoprazole  40 mg Oral Daily   sodium chloride flush  3 mL Intravenous Q12H   Continuous Infusions: PRN Meds:.alum & mag hydroxide-simeth, bisacodyl, menthol-cetylpyridinium **OR** phenol, methocarbamol **OR** methocarbamol (ROBAXIN) injection, metoCLOPramide **OR** metoCLOPramide (REGLAN) injection, morphine injection, ondansetron **OR** ondansetron (ZOFRAN) IV, oxyCODONE, polyethylene glycol, traMADol  Antimicrobials: Anti-infectives (From admission, onward)    Start     Dose/Rate Route Frequency Ordered Stop   11/17/23 0033  ceFAZolin (ANCEF) IVPB 2g/100 mL premix        2 g 200 mL/hr over 30 Minutes Intravenous Every 6 hours 11/17/23 0033 11/17/23 0549   11/16/23 1130  ceFAZolin (ANCEF) IVPB 2g/100 mL premix        2 g 200 mL/hr over 30 Minutes Intravenous  Once 11/16/23 1115 11/16/23 1313   11/16/23 1117  ceFAZolin (ANCEF) 2-4 GM/100ML-% IVPB       Note to Pharmacy: Crissie Sickles: cabinet override      11/16/23 1117 11/16/23 1347        I have personally reviewed the following labs and images: CBC: Recent Labs  Lab 11/15/23 1720 11/16/23 0600 11/17/23 0904  WBC 12.6* 9.6 11.1*  NEUTROABS 11.1*  --   --   HGB 10.1* 9.0* 7.7*  HCT 30.7* 26.2* 22.6*  MCV 92.5 90.7 89.7  PLT 245  208 196   BMP &GFR Recent Labs  Lab 11/15/23 1720 11/16/23 0600 11/17/23 0743  NA 137 140 137  K 4.5 4.1 4.1  CL 100 105 108  CO2 22 25 21*  GLUCOSE 323* 121* 103*  BUN 45* 40* 18  CREATININE 2.49* 1.94* 1.20  CALCIUM 9.4 9.1 7.8*  MG  --  2.2 2.0  PHOS  --  2.9 2.8   Estimated Creatinine Clearance: 51.7 mL/min (by C-G formula based on SCr of 1.2 mg/dL). Liver & Pancreas: Recent Labs  Lab 11/15/23 1720 11/17/23 0743  AST 45*  --   ALT 44  --   ALKPHOS 83  --   BILITOT 1.0  --   PROT 7.0  --   ALBUMIN 2.9* 2.2*   No results for input(s): "LIPASE", "AMYLASE" in the last 168 hours. No results for input(s): "AMMONIA" in the last  168 hours. Diabetic: Recent Labs    11/16/23 0600  HGBA1C 6.4*   Recent Labs  Lab 11/15/23 1702 11/16/23 0950 11/16/23 1153 11/16/23 1545  GLUCAP 271* 91 80 89   Cardiac Enzymes: Recent Labs  Lab 11/15/23 1720  CKTOTAL 193   No results for input(s): "PROBNP" in the last 8760 hours. Coagulation Profile: Recent Labs  Lab 11/16/23 0600  INR 1.3*   Thyroid Function Tests: Recent Labs    11/16/23 0600  TSH 1.026   Lipid Profile: Recent Labs    11/16/23 0600  CHOL 108  HDL 30*  LDLCALC 65  TRIG 66  CHOLHDL 3.6   Anemia Panel: Recent Labs    11/16/23 0600  VITAMINB12 708  FOLATE 12.2  FERRITIN 312  TIBC 185*  IRON 44*   Urine analysis:    Component Value Date/Time   COLORURINE YELLOW 11/16/2023 0446   APPEARANCEUR HAZY (A) 11/16/2023 0446   LABSPEC 1.013 11/16/2023 0446   PHURINE 5.0 11/16/2023 0446   GLUCOSEU 50 (A) 11/16/2023 0446   HGBUR NEGATIVE 11/16/2023 0446   BILIRUBINUR NEGATIVE 11/16/2023 0446   KETONESUR NEGATIVE 11/16/2023 0446   PROTEINUR NEGATIVE 11/16/2023 0446   NITRITE NEGATIVE 11/16/2023 0446   LEUKOCYTESUR NEGATIVE 11/16/2023 0446   Sepsis Labs: Invalid input(s): "PROCALCITONIN", "LACTICIDVEN"  Microbiology: Recent Results (from the past 240 hours)  Surgical PCR screen      Status: None   Collection Time: 11/16/23  2:54 AM   Specimen: Nasal Mucosa; Nasal Swab  Result Value Ref Range Status   MRSA, PCR NEGATIVE NEGATIVE Final   Staphylococcus aureus NEGATIVE NEGATIVE Final    Comment: (NOTE) The Xpert SA Assay (FDA approved for NASAL specimens in patients 33 years of age and older), is one component of a comprehensive surveillance program. It is not intended to diagnose infection nor to guide or monitor treatment. Performed at [facility], [address], [place], [place] [address]     Radiology Studies: DG HIP Lucienne Capers OR W/O PELVIS 2-3 VIEWS LEFT Result Date: 11/16/2023 CLINICAL DATA:  [id number] History of left hip hemiarthroplasty [id number] EXAM: DG HIP (WITH OR WITHOUT PELVIS) 2-3V LEFT COMPARISON:  11/15/2023 FINDINGS: Interval left hip arthroplasty, components projecting in expected location. No fracture or dislocation. Surgical clips project over the pubic bones. Patchy iliofemoral arterial calcifications. IMPRESSION: Left hip arthroplasty, without apparent complication. Electronically Signed   By: Corlis Leak M.D.   On: 11/16/2023 16:11      Nicholaus Steinke [address] [facility]  If 7PM-7AM, please contact night-coverage [email or web address] 11/17/2023, 11:24 AM

## 2023-11-17 NOTE — Plan of Care (Signed)
  Problem: SLP Dysphagia Goals Goal: Misc Dysphagia Goal Flowsheets (Taken 11/17/2023 1207) Misc Dysphagia Goal: Pt will tolerate a regular diet and thin liquids with no overt or subtle s/s of aspiration or decline in respiratory stauts.

## 2023-11-17 NOTE — Evaluation (Signed)
Clinical/Bedside Swallow Evaluation Patient Details  Name: William Todd MRN: [id number] Date of Birth: [birth year]  Today's Date: 11/17/2023 Time: SLP Start Time (ACUTE ONLY): 1130 SLP Stop Time (ACUTE ONLY): 1141 SLP Time Calculation (min) (ACUTE ONLY): 11 min  Past Medical History:  Past Medical History:  Diagnosis Date   Anxiety    new dx   Chronic kidney disease    Diabetes mellitus    type ii   History of radiation therapy 09/29/13- 11/25/13   prostate 7800 cGy 40 sessions, seminal vesicles 5600 cGy 40 sessions   Hypertension    Prostate cancer (HCC) 06/01/2013   gleason 7   Past Surgical History:  Past Surgical History:  Procedure Laterality Date   CARDIAC CATHETERIZATION  01/01/2013   FRACTURE SURGERY     left foot    LEFT HEART CATHETERIZATION WITH CORONARY ANGIOGRAM N/A 01/01/2013   Procedure: LEFT HEART CATHETERIZATION WITH CORONARY ANGIOGRAM;  Surgeon: Robynn Pane, MD;  Location: [facility] CATH LAB;  Service: Cardiovascular;  Laterality: N/A;   PROSTATE BIOPSY  06/01/13   HPI:  William Todd is a 73 y.o. male with hx of CAD, hypertension, hyperlipidemia, diabetes, prostate cancer, who was brought in after fall 2 weeks ago and inability to ambulate since this time.  Patient had been refusing to be evaluated but wife had enough and decided to bring in this evening.  Patient is unable to provide any coherent history about why he is in the hospital today or what has been happening recently.  His wife reports 2 weeks ago he had walked up the stairs began to feel lightheaded, reach out for the wall but missed and fell forward.  He had complained of pain in the left lower extremity but this had improved.  Was unable to ambulate since the fall.  Otherwise wife notes that he sometimes "is talking out of his head", no known history of dementia.  He has significant memory impairment regarding short-term events.  No headache, speech changes, vision changes (chronic visual loss), numbness  or focal weakness (apart from inability to ambulate on the left leg) in his extremities.  No other recent illness. MRI Brain negative for acute intracranial abnormality.    Assessment / Plan / Recommendation  Clinical Impression   Pt presents with a mild oral dysphagia (etiology unknown) that appeared to wax/wane and may be related to lethargy vs an oral deficit. Pt and wife deny hx of dysphagia or acute concerns for swallowing deficits. Oral deficits characterized by reduced labial coordination and intermittent oral groping when attempting to seal lips around cup and spoon. Pt able to extract liquids and purees without anterior spillage. Pt able to clear all trials completely from oral cavity. Pt challenged with coordination and strength to feed self, which wife reported is new today.  Pharyngeal swallow initiation appeared prompt with laryngeal elevation noted. No overt or subtle s/s of aspiration observed.   Recommend continue current regular diet and thin liquids as tolerated. PO meds as tolerated. Notified RN re: oral and upper extremity coordination concerns.  According to pt's wife, these deficits are new.   Plan: SLP will follow up to assess diet tolerance and modify diet if indicated.   SLP Visit Diagnosis: Dysphagia, oral phase (R13.11)    Aspiration Risk  Mild aspiration risk    Diet Recommendation Regular;Thin liquid    Liquid Administration via: Cup;Straw Medication Administration: Whole meds with liquid Supervision: Staff to assist with self feeding Compensations: Slow rate;Small sips/bites Postural Changes:  Seated upright at 90 degrees    Other  Recommendations Oral Care Recommendations: Oral care BID    Recommendations for follow up therapy are one component of a multi-disciplinary discharge planning process, led by the attending physician.  Recommendations may be updated based on patient status, additional functional criteria and insurance authorization.  Follow up  Recommendations Follow physician's recommendations for discharge plan and follow up therapies      Assistance Recommended at Discharge    Functional Status Assessment Patient has had a recent decline in their functional status and demonstrates the ability to make significant improvements in function in a reasonable and predictable amount of time.  Frequency and Duration min 1 x/week  1 week       Prognosis Prognosis for improved oropharyngeal function: Fair Barriers to Reach Goals: Cognitive deficits      Swallow Study   General Date of Onset: 11/15/23 HPI: William Todd is a 73 y.o. male with hx of CAD, hypertension, hyperlipidemia, diabetes, prostate cancer, who was brought in after fall 2 weeks ago and inability to ambulate since this time.  Patient had been refusing to be evaluated but wife had enough and decided to bring in this evening.  Patient is unable to provide any coherent history about why he is in the hospital today or what has been happening recently.  His wife reports 2 weeks ago he had walked up the stairs began to feel lightheaded, reach out for the wall but missed and fell forward.  He had complained of pain in the left lower extremity but this had improved.  Was unable to ambulate since the fall.  Otherwise wife notes that he sometimes "is talking out of his head", no known history of dementia.  He has significant memory impairment regarding short-term events.  No headache, speech changes, vision changes (chronic visual loss), numbness or focal weakness (apart from inability to ambulate on the left leg) in his extremities.  No other recent illness. MRI Brain negative for acute intracranial abnormality. Type of Study: Bedside Swallow Evaluation Previous Swallow Assessment: none per chart Diet Prior to this Study: Regular;Thin liquids (Level 0) Temperature Spikes Noted: No Respiratory Status: Room air History of Recent Intubation: No Behavior/Cognition:  Alert;Cooperative;Requires cueing Oral Cavity Assessment: Within Functional Limits Oral Cavity - Dentition: Adequate natural dentition Self-Feeding Abilities: Needs assist Patient Positioning: Upright in bed Baseline Vocal Quality: Normal    Oral/Motor/Sensory Function Overall Oral Motor/Sensory Function: Within functional limits   Ice Chips Ice chips: Not tested   Thin Liquid Thin Liquid: Impaired Presentation: Cup;Straw Oral Phase Impairments: Reduced labial seal    Nectar Thick Nectar Thick Liquid: Not tested   Honey Thick Honey Thick Liquid: Not tested   Puree Puree: Impaired Oral Phase Impairments: Reduced labial seal   Solid     Solid: Within functional limits      Ellery Plunk 11/17/2023,12:15 PM

## 2023-11-17 NOTE — Progress Notes (Signed)
Subjective: Patient still confused. Thinks he is at home. Doesn't know why he is at the hospital. Reports pain as mild to moderate.  Tolerating diet.  Urinating.   No CP, SOB.  Has not mobilized OOB yet.  Objective:   VITALS:   Vitals:   11/16/23 1639 11/16/23 1932 11/17/23 0515 11/17/23 0723  BP: 116/80 123/66 116/61 (!) 97/49  Pulse: 76 84 70 83  Resp: 18 19 17 17   Temp: 97.6 F (36.4 C) 98 F (36.7 C) 98.2 F (36.8 C) 98.4 F (36.9 C)  TempSrc: Oral Oral Axillary Oral  SpO2: 97% 98% 97% 98%  Weight:      Height:          Latest Ref Rng & Units 11/16/2023    6:00 AM 11/15/2023    5:20 PM 01/02/2013    3:31 AM  CBC  WBC 4.0 - 10.5 K/uL 9.6  12.6  7.9   Hemoglobin 13.0 - 17.0 g/dL 9.0  52.8  41.3   Hematocrit 39.0 - 52.0 % 26.2  30.7  30.2   Platelets 150 - 400 K/uL 208  245  121       Latest Ref Rng & Units 11/17/2023    7:43 AM 11/16/2023    6:00 AM 11/15/2023    5:20 PM  BMP  Glucose 70 - 99 mg/dL 244  010  272   BUN 8 - 23 mg/dL 18  40  45   Creatinine 0.61 - 1.24 mg/dL 5.36  6.44  0.34   Sodium 135 - 145 mmol/L 137  140  137   Potassium 3.5 - 5.1 mmol/L 4.1  4.1  4.5   Chloride 98 - 111 mmol/L 108  105  100   CO2 22 - 32 mmol/L 21  25  22    Calcium 8.9 - 10.3 mg/dL 7.8  9.1  9.4    Intake/Output      02/02 0701 02/03 0700 02/03 0701 02/04 0700   P.O.     I.V. (mL/kg) 515.8 (7.7)    IV Piggyback 450    Total Intake(mL/kg) 965.8 (14.5)    Urine (mL/kg/hr) 450 (0.3)    Blood 325    Total Output 775    Net +190.8         Urine Occurrence 1 x       Physical Exam: General: NAD.  Sitting up in bed, calm, comfortable Resp: No increased wob Cardio: regular rate and rhythm ABD soft Neurologically intact MSK Neurovascularly intact Sensation intact distally Intact pulses distally Dorsiflexion/Plantar flexion intact Incision: dressing C/D/I   Assessment: 1 Day Post-Op  S/P Procedure(s) (LRB): ARTHROPLASTY BIPOLAR HIP (HEMIARTHROPLASTY) (Left) by  Dr. Jewel Baize. Eulah Pont on 11/16/23  Principal Problem:   Fracture of femoral neck, left, closed (HCC) Active Problems:   Abnormal head CT   Memory deficit   AKI (acute kidney injury) (HCC)   Plan:  Advance diet Up with therapy Incentive Spirometry Elevate and Apply ice  Weightbearing: WBAT LLE Insicional and dressing care: Dressings left intact until follow-up and Reinforce dressings as needed Orthopedic device(s): None Showering: Keep dressing dry VTE prophylaxis: Aspirin 81mg  qd  and Plavix to be restarted POD 1 if Hgb >8 , SCDs, ambulation Pain control: PRN, limit narcotics as able due to confusion Follow - up plan: 2 weeks Contact information:  Margarita Rana MD, Levester Fresh PA-C  Dispo:  TBD based on PT evaluations    Marzetta Board Office [phone] 11/17/2023, 8:58  AM

## 2023-11-18 ENCOUNTER — Encounter (HOSPITAL_COMMUNITY): Payer: Self-pay | Admitting: Orthopedic Surgery

## 2023-11-18 DIAGNOSIS — N179 Acute kidney failure, unspecified: Secondary | ICD-10-CM | POA: Diagnosis not present

## 2023-11-18 DIAGNOSIS — R930 Abnormal findings on diagnostic imaging of skull and head, not elsewhere classified: Secondary | ICD-10-CM | POA: Diagnosis not present

## 2023-11-18 DIAGNOSIS — R413 Other amnesia: Secondary | ICD-10-CM | POA: Diagnosis not present

## 2023-11-18 DIAGNOSIS — S72002D Fracture of unspecified part of neck of left femur, subsequent encounter for closed fracture with routine healing: Secondary | ICD-10-CM | POA: Diagnosis not present

## 2023-11-18 LAB — RENAL FUNCTION PANEL
Albumin: 2.8 g/dL — ABNORMAL LOW (ref 3.5–5.0)
Anion gap: 13 (ref 5–15)
BUN: 47 mg/dL — ABNORMAL HIGH (ref 8–23)
CO2: 25 mmol/L (ref 22–32)
Calcium: 9.2 mg/dL (ref 8.9–10.3)
Chloride: 102 mmol/L (ref 98–111)
Creatinine, Ser: 2.15 mg/dL — ABNORMAL HIGH (ref 0.61–1.24)
GFR, Estimated: 32 mL/min — ABNORMAL LOW (ref >60)
Glucose, Bld: 101 mg/dL — ABNORMAL HIGH (ref 70–99)
Phosphorus: 2.7 mg/dL (ref 2.5–4.6)
Potassium: 4.1 mmol/L (ref 3.5–5.1)
Sodium: 140 mmol/L (ref 135–145)

## 2023-11-18 LAB — MAGNESIUM: Magnesium: 2 mg/dL (ref 1.7–2.4)

## 2023-11-18 LAB — CBC
HCT: 23.5 % — ABNORMAL LOW (ref 39.0–52.0)
Hemoglobin: 8 g/dL — ABNORMAL LOW (ref 13.0–17.0)
MCH: 30.8 pg (ref 26.0–34.0)
MCHC: 34 g/dL (ref 30.0–36.0)
MCV: 90.4 fL (ref 80.0–100.0)
Platelets: 170 10*3/uL (ref 150–400)
RBC: 2.6 MIL/uL — ABNORMAL LOW (ref 4.22–5.81)
RDW: 12.6 % (ref 11.5–15.5)
WBC: 11.4 10*3/uL — ABNORMAL HIGH (ref 4.0–10.5)
nRBC: 0 % (ref 0.0–0.2)

## 2023-11-18 LAB — GLUCOSE, CAPILLARY
Glucose-Capillary: 100 mg/dL — ABNORMAL HIGH (ref 70–99)
Glucose-Capillary: 238 mg/dL — ABNORMAL HIGH (ref 70–99)
Glucose-Capillary: 150 mg/dL — ABNORMAL HIGH (ref 70–99)
Glucose-Capillary: 165 mg/dL — ABNORMAL HIGH (ref 70–99)

## 2023-11-18 MED ORDER — LACTATED RINGERS IV BOLUS
1000.0000 mL | Freq: Once | INTRAVENOUS | Status: AC
  Administered 2023-11-18: 1000 mL via INTRAVENOUS

## 2023-11-18 MED ORDER — ASPIRIN 81 MG PO TBEC
81.0000 mg | DELAYED_RELEASE_TABLET | Freq: Two times a day (BID) | ORAL | Status: DC
  Administered 2023-11-18 – 2023-11-20 (×5): 81 mg via ORAL
  Filled 2023-11-18 (×5): qty 1

## 2023-11-18 NOTE — Progress Notes (Signed)
 RE:  William Todd      Date of Birth: 09/25/1951      Date:   11/18/23       To Whom It May Concern:  Please be advised that the above-named patient will require a short-term nursing home stay - anticipated 30 days or less for rehabilitation and strengthening.  The plan is for return home.                 MD signature                Date

## 2023-11-18 NOTE — Progress Notes (Addendum)
 Physical Therapy Treatment  Patient Details Name: William Todd MRN: [id number] DOB: Aug 05, 1951 Today's Date: 11/18/2023   History of Present Illness Pt is a 73 y/o male who presents 11/15/2023 after fall 2 weeks ago and inability to ambulate since that time. Pt had been refusing to be evaluated but wife brought pt into the ED. He was found to have an acute transverse fracture of the L femoral neck and is now s/p L hemiarthroplasty on 11/16/2023. PMH significant for anxiety, CKD, DM II, prostate CA s/p radiation, HTN, L foot fracture surgery (unknown date).    PT Comments  Pt seen for second session after wife arrived. Confirmed home environment details, and it appears that pt can stay on the main level of home with 1 step to manage into the house from garage, and 1 step down into the 1st floor bedroom. If pt went upstairs to bedroom/full bathroom he would have to negotiate 13 stairs. Wife reports that since initial fall cognition has declined. Today, pt requiring significant cueing and up to mod assist to sequence transfers, for navigation around the room, and for walker management. Vision appears worse today with pt reaching out to feel if objects were in front of him vs yesterday when pt could navigate around the bed, out the room door and down the hall with min cues. Wife attributing some of this to baseline low vision, however there appears to be a cognitive aspect to these current deficits as well. Discussed recommendation for post-acute rehab with wife and she is agreeable. TOC and MD updated after session. Will continue to follow and progress as able per POC.    If plan is discharge home, recommend the following: A little help with walking and/or transfers;A little help with bathing/dressing/bathroom;Assistance with cooking/housework;Direct supervision/assist for medications management;Direct supervision/assist for financial management;Assist for transportation;Help with stairs or ramp for  entrance;Supervision due to cognitive status   Can travel by private vehicle     Yes  Equipment Recommendations  Rolling walker (2 wheels)    Recommendations for Other Services       Precautions / Restrictions Precautions Precautions: Fall;Posterior Hip Precaution Booklet Issued: Yes (comment) Precaution Comments: verbally reviewed, pt provided handout but he cannot see it Required Braces or Orthoses: Other Brace (AFO to L foot) Restrictions Weight Bearing Restrictions Per Provider Order: Yes LLE Weight Bearing Per Provider Order: Weight bearing as tolerated     Mobility  Bed Mobility               General bed mobility comments: Pt received sitting up in the recliner.    Transfers Overall transfer level: Needs assistance Equipment used: Rolling walker (2 wheels) Transfers: Sit to/from Stand Sit to Stand: Min assist, Contact guard assist           General transfer comment: Heavy cues for hand placement on seated surface for safety.    Ambulation/Gait Ambulation/Gait assistance: +2 safety/equipment, Min assist, Mod assist Gait Distance (Feet): 25 Feet Assistive device: Rolling walker (2 wheels) Gait Pattern/deviations: Step-through pattern, Decreased stride length, Trunk flexed, Shuffle Gait velocity: Decreased Gait velocity interpretation: <1.31 ft/sec, indicative of household ambulator   General Gait Details: Ambulated in room only as wife present and needing to see how pt is currently mobilizing. Assist for balance, sequencing, navigating, and walker management.   Stairs Stairs:  (Unable to progress to stair training at this time.)           Wheelchair Mobility     Tilt Bed  Modified Rankin (Stroke Patients Only)       Balance Overall balance assessment: Needs assistance Sitting-balance support: Feet supported, No upper extremity supported Sitting balance-Leahy Scale: Fair     Standing balance support: Bilateral upper extremity  supported, Single extremity supported, During functional activity Standing balance-Leahy Scale: Poor                              Cognition Arousal: Alert Behavior During Therapy: WFL for tasks assessed/performed Overall Cognitive Status: Impaired/Different from baseline Area of Impairment: Orientation, Attention, Memory, Following commands, Safety/judgement, Awareness, Problem solving                 Orientation Level: Disoriented to, Situation, Time, Place Current Attention Level: Focused Memory: Decreased recall of precautions, Decreased short-term memory Following Commands: Follows one step commands inconsistently, Follows one step commands with increased time, Follows multi-step commands inconsistently Safety/Judgement: Decreased awareness of safety, Decreased awareness of deficits Awareness: Intellectual Problem Solving: Slow processing, Decreased initiation, Difficulty sequencing, Requires verbal cues, Requires tactile cues General Comments: pt perseverating on I didn't hurt anyone, I didn't do anything.        Exercises      General Comments        Pertinent Vitals/Pain Pain Assessment Pain Assessment: Faces Faces Pain Scale: Hurts a little bit Pain Location: R hip Pain Descriptors / Indicators: Operative site guarding Pain Intervention(s): Monitored during session, Limited activity within patient's tolerance, Repositioned    Home Living             Entrance Stairs-Number of Steps: 1 from the garage into the house Alternate Level Stairs-Number of Steps: 13 upstiars to bed/bath. Does have a murphy bed downstairs in a sunken room - 1 step down. Home Layout: Two level;1/2 bath on main level;Able to live on main level with bedroom/bathroom Home Equipment: Cane - single point;Rolling Walker (2 wheels)      Prior Function            PT Goals (current goals can now be found in the care plan section) Acute Rehab PT Goals Patient Stated  Goal: None stated PT Goal Formulation: With patient Time For Goal Achievement: 12/01/23 Potential to Achieve Goals: Good Progress towards PT goals: Progressing toward goals    Frequency    Min 1X/week      PT Plan      Co-evaluation              AM-PAC PT 6 Clicks Mobility   Outcome Measure  Help needed turning from your back to your side while in a flat bed without using bedrails?: A Little Help needed moving from lying on your back to sitting on the side of a flat bed without using bedrails?: A Little Help needed moving to and from a bed to a chair (including a wheelchair)?: A Little Help needed standing up from a chair using your arms (e.g., wheelchair or bedside chair)?: A Little Help needed to walk in hospital room?: A Little Help needed climbing 3-5 steps with a railing? : A Little 6 Click Score: 18    End of Session Equipment Utilized During Treatment: Gait belt Activity Tolerance: Patient tolerated treatment well Patient left: in chair;with call bell/phone within reach;with chair alarm set Nurse Communication: Mobility status PT Visit Diagnosis: Unsteadiness on feet (R26.81);History of falling (Z91.81);Difficulty in walking, not elsewhere classified (R26.2)     Time: 1030-1057 PT Time Calculation (min) (ACUTE ONLY): 27  min  Charges:    $Gait Training: 8-22 mins $Self Care/Home Management: 8-22 PT General Charges $$ ACUTE PT VISIT: 1 Visit                     Leita Sable, PT, DPT Acute Rehabilitation Services Secure Chat Preferred Office: [phone]    Leita JONETTA Sable 11/18/2023, 1:12 PM

## 2023-11-18 NOTE — Progress Notes (Signed)
 PROGRESS NOTE  William Todd FMW:[id number] DOB: 10/08/1951   PCP: Joshua Francisco, MD  Patient is from: Lives with wife.  Independently ambulates at baseline.  DOA: 11/15/2023 LOS: 3  Chief complaints Chief Complaint  Patient presents with   Fall     Brief Narrative / Interim history: 73 year old M with PMH of DM-2, prostate cancer, HTN, HLD, left foot fracture with prosthesis and cognitive impairment brought to ED due to inability to ambulate after he sustained a fall about 2 weeks prior.  Patient has been refusing to be evaluated but wife had enough and decided to bring him to the hospital. He has significant memory impairment regarding short-term events.  X-ray showed an acute transverse fracture of the left femoral neck with varus angulation of the fracture fragments.  Orthopedics consulted.  He underwent hemiarthroplasty on 2/2.  He also had AKI that has resolved.   Postop course complicated by delirium/confusion.  Therapy recommended SNF.  Subjective: Seen and examined earlier this morning.  No major events overnight of this morning.   He is oriented x 4 except date but confused, anxious and apprehensive. He is asking for his family.  He says he likes to go home and be with his family.  Conditional to pain.    Objective: Vitals:   11/17/23 1520 11/17/23 2000 11/18/23 0500 11/18/23 0714  BP: (!) 102/59 121/76 (!) 107/58 134/64  Pulse: 80 74 86 100  Resp: 17 18 16 17   Temp: 98.2 F (36.8 C) 98.4 F (36.9 C) 99 F (37.2 C) 98.1 F (36.7 C)  TempSrc: Oral Oral Oral Oral  SpO2: 99% 99% 97% 96%  Weight:      Height:        Examination:  GENERAL: No apparent distress.  Nontoxic. HEENT: MMM.  Vision and hearing grossly intact.  NECK: Supple.  No apparent JVD.  RESP:  No IWOB.  Fair aeration bilaterally. CVS:  RRR. Heart sounds normal.  ABD/GI/GU: BS+. Abd soft, NTND.  MSK/EXT:  Moves extremities.  Chronic left ankle deformity.  Dressing over left hip DCI. SKIN: As  above. NEURO: Awake and alert.  Oriented x 4 but confused.  No apparent focal neuro deficit. PSYCH: Appears anxious.  Procedures:  2/2-left hip hemiarthroplasty by Dr. Beverley  Microbiology summarized: Surgical MRSA PCR negative.  Assessment and plan: Accidental fall at home-fell about 2 weeks ago.   Has left foot fracture and wears prosthesis. Traumatic left femoral neck fracture due to accidental fall at home.  X-ray confirmed fracture.  Vitamin D  level 52. -S/p left hemiarthroplasty on 2/2 -Pain control and VTE prophylaxis per surgery. -PT/OT recommended SNF. -Fall precaution     Essential hypertension: Normotensive for most part. -Continue holding home antihypertensive meds    AKI?  CKD-4?  Creatinine down to 1.2 on 2/3.  Lab error? Recent Labs    11/15/23 1720 11/16/23 0600 11/17/23 0743 11/18/23 0603  BUN 45* 40* 18 47*  CREATININE 2.49* 1.94* 1.20 2.15*  -Give IV fluid bolus 1 L x 1 today -Reassess renal function in the morning  NIDDM-2 with hyperglycemia: A1c 6.4%. Recent Labs  Lab 11/17/23 1256 11/17/23 1632 11/17/23 2227 11/18/23 0633 11/18/23 1119  GLUCAP 129* 165* 179* 100* 238*  -Continue SSI-sensitive with night coverage. -Further adjustment as appropriate. -Hold home glimepiride    Normocytic anemia: Hgb dropped about 2 g postoperatively.  No report of EBL.  Unknown baseline.  Anemia panel consistent with anemia of chronic disease.  H&H stable now Recent Labs  11/15/23 1720 11/16/23 0600 11/17/23 0904 11/18/23 0603  HGB 10.1* 9.0* 7.7* 8.0*  -Monitor H&H.  Transfuse for Hgb less than 7.0.  No history of CAD per wife.   -Wife verbally consented for blood transfusion when needed.  Acute metabolic encephalopathy/delirium/dementia with behavioral disturbance: Seems to have resolved.  MRI brain without acute finding.  Currently AAO/4 but confused, anxious and apprehensive.  -Reorientation and delirium precaution -Minimize or avoid sedating  medications. -Fall precautions.  Mood disorder: Stable.  No longer takes Lexapro   Nutrition Body mass index is 19.93 kg/m. -Dietitian consulted.          DVT prophylaxis:  SCDs Start: 11/17/23 0034  Code Status: Full code Family Communication: Updated patient's wife at the bedside on 2/3.  None at bedside today. Level of care: Med-Surg Status is: Inpatient Remains inpatient appropriate because: Left hip fracture, AKI and delirium   Final disposition: Likely SNF Consultants:  Orthopedic surgery  35 minutes with more than 50% spent in reviewing records, counseling patient/family and coordinating care.   Sch Meds:  Scheduled Meds:  acetaminophen   650 mg Oral Q6H WA   aspirin  EC  81 mg Oral BID   atorvastatin   40 mg Oral q1800   calcium  carbonate  1 tablet Oral BID WC   cholecalciferol   1,000 Units Oral Daily   docusate sodium   100 mg Oral BID   insulin  aspart  0-5 Units Subcutaneous QHS   insulin  aspart  0-9 Units Subcutaneous TID WC   mupirocin  ointment  1 Application Nasal BID   pantoprazole   40 mg Oral Daily   sodium chloride  flush  3 mL Intravenous Q12H   Continuous Infusions: PRN Meds:.alum & mag hydroxide-simeth, bisacodyl , menthol -cetylpyridinium **OR** phenol, methocarbamol  **OR** methocarbamol  (ROBAXIN ) injection, metoCLOPramide  **OR** metoCLOPramide  (REGLAN ) injection, morphine  injection, ondansetron  **OR** ondansetron  (ZOFRAN ) IV, oxyCODONE , polyethylene glycol, traMADol   Antimicrobials: Anti-infectives (From admission, onward)    Start     Dose/Rate Route Frequency Ordered Stop   11/17/23 0033  ceFAZolin  (ANCEF ) IVPB 2g/100 mL premix        2 g 200 mL/hr over 30 Minutes Intravenous Every 6 hours 11/17/23 0033 11/17/23 0549   11/16/23 1130  ceFAZolin  (ANCEF ) IVPB 2g/100 mL premix        2 g 200 mL/hr over 30 Minutes Intravenous  Once 11/16/23 1115 11/16/23 1313   11/16/23 1117  ceFAZolin  (ANCEF ) 2-4 GM/100ML-% IVPB       Note to Pharmacy: Lebron Devere HERO: cabinet override      11/16/23 1117 11/16/23 1347        I have personally reviewed the following labs and images: CBC: Recent Labs  Lab 11/15/23 1720 11/16/23 0600 11/17/23 0904 11/18/23 0603  WBC 12.6* 9.6 11.1* 11.4*  NEUTROABS 11.1*  --   --   --   HGB 10.1* 9.0* 7.7* 8.0*  HCT 30.7* 26.2* 22.6* 23.5*  MCV 92.5 90.7 89.7 90.4  PLT 245 208 196 170   BMP &GFR Recent Labs  Lab 11/15/23 1720 11/16/23 0600 11/17/23 0743 11/18/23 0603  NA 137 140 137 140  K 4.5 4.1 4.1 4.1  CL 100 105 108 102  CO2 22 25 21* 25  GLUCOSE 323* 121* 103* 101*  BUN 45* 40* 18 47*  CREATININE 2.49* 1.94* 1.20 2.15*  CALCIUM  9.4 9.1 7.8* 9.2  MG  --  2.2 2.0 2.0  PHOS  --  2.9 2.8 2.7   Estimated Creatinine Clearance: 28.9 mL/min (A) (by C-G formula based on SCr of  2.15 mg/dL (H)). Liver & Pancreas: Recent Labs  Lab 11/15/23 1720 11/17/23 0743 11/18/23 0603  AST 45*  --   --   ALT 44  --   --   ALKPHOS 83  --   --   BILITOT 1.0  --   --   PROT 7.0  --   --   ALBUMIN  2.9* 2.2* 2.8*   No results for input(s): LIPASE, AMYLASE in the last 168 hours. No results for input(s): AMMONIA in the last 168 hours. Diabetic: Recent Labs    11/16/23 0600  HGBA1C 6.4*   Recent Labs  Lab 11/17/23 1256 11/17/23 1632 11/17/23 2227 11/18/23 0633 11/18/23 1119  GLUCAP 129* 165* 179* 100* 238*   Cardiac Enzymes: Recent Labs  Lab 11/15/23 1720  CKTOTAL 193   No results for input(s): PROBNP in the last 8760 hours. Coagulation Profile: Recent Labs  Lab 11/16/23 0600  INR 1.3*   Thyroid Function Tests: Recent Labs    11/16/23 0600  TSH 1.026   Lipid Profile: Recent Labs    11/16/23 0600  CHOL 108  HDL 30*  LDLCALC 65  TRIG 66  CHOLHDL 3.6   Anemia Panel: Recent Labs    11/16/23 0600 11/17/23 1202  VITAMINB12 708 731  FOLATE 12.2 8.9  FERRITIN 312 329  TIBC 185* 164*  IRON 44* 36*  RETICCTPCT  --  2.4   Urine analysis:    Component Value  Date/Time   COLORURINE YELLOW 11/16/2023 0446   APPEARANCEUR HAZY (A) 11/16/2023 0446   LABSPEC 1.013 11/16/2023 0446   PHURINE 5.0 11/16/2023 0446   GLUCOSEU 50 (A) 11/16/2023 0446   HGBUR NEGATIVE 11/16/2023 0446   BILIRUBINUR NEGATIVE 11/16/2023 0446   KETONESUR NEGATIVE 11/16/2023 0446   PROTEINUR NEGATIVE 11/16/2023 0446   NITRITE NEGATIVE 11/16/2023 0446   LEUKOCYTESUR NEGATIVE 11/16/2023 0446   Sepsis Labs: Invalid input(s): PROCALCITONIN, LACTICIDVEN  Microbiology: Recent Results (from the past 240 hours)  Surgical PCR screen     Status: None   Collection Time: 11/16/23  2:54 AM   Specimen: Nasal Mucosa; Nasal Swab  Result Value Ref Range Status   MRSA, PCR NEGATIVE NEGATIVE Final   Staphylococcus aureus NEGATIVE NEGATIVE Final    Comment: (NOTE) The Xpert SA Assay (FDA approved for NASAL specimens in patients 46 years of age and older), is one component of a comprehensive surveillance program. It is not intended to diagnose infection nor to guide or monitor treatment. Performed at [facility], [address], [place], [place] [address]     Radiology Studies: No results found.     Shanyn Preisler T. Rafe Mackowski [facility]  If 7PM-7AM, please contact night-coverage [email or web address] 11/18/2023, 11:52 AM

## 2023-11-18 NOTE — Progress Notes (Signed)
 Physical Therapy Treatment  Patient Details Name: William Todd MRN: [id number] DOB: 02/28/1951 Today's Date: 11/18/2023   History of Present Illness Pt is a 73 y/o male who presents 11/15/2023 after fall 2 weeks ago and inability to ambulate since that time. Pt had been refusing to be evaluated but wife brought pt into the ED. He was found to have an acute transverse fracture of the L femoral neck and is now s/p L hemiarthroplasty on 11/16/2023. PMH significant for anxiety, CKD, DM II, prostate CA s/p radiation, HTN, L foot fracture surgery (unknown date).    PT Comments  Pt progressing slowly towards physical therapy goals. Pt required increased cues and assistance for functional mobility this date. A second person was required for chair follow, as pt needed 2 hands-on assistance for walker guidance, balance support, and navigation in room out to hall. Will need to discuss d/c plan with wife, to ensure pt has adequate assistance available at home. Continue to feel pt would benefit from return home from a cognitive standpoint, however wife will need to ensure she can provide the physical assistance pt currently requires. Will continue to follow and progress as able per POC.    If plan is discharge home, recommend the following: A little help with walking and/or transfers;A little help with bathing/dressing/bathroom;Assistance with cooking/housework;Direct supervision/assist for medications management;Direct supervision/assist for financial management;Assist for transportation;Help with stairs or ramp for entrance;Supervision due to cognitive status   Can travel by private vehicle     Yes  Equipment Recommendations  Rolling walker (2 wheels)    Recommendations for Other Services       Precautions / Restrictions Precautions Precautions: Fall;Posterior Hip Precaution Booklet Issued: Yes (comment) Precaution Comments: verbally reviewed, pt provided handout but he cannot see it Required Braces or  Orthoses: Other Brace (AFO to L foot) Restrictions Weight Bearing Restrictions Per Provider Order: Yes LLE Weight Bearing Per Provider Order: Weight bearing as tolerated     Mobility  Bed Mobility               General bed mobility comments: Pt received sitting up in the recliner.    Transfers Overall transfer level: Needs assistance Equipment used: Rolling walker (2 wheels) Transfers: Sit to/from Stand Sit to Stand: Min assist, Contact guard assist           General transfer comment: Heavy cues for hand placement on seated surface for safety.    Ambulation/Gait Ambulation/Gait assistance: +2 safety/equipment, Min assist, Mod assist Gait Distance (Feet): 40 Feet Assistive device: Rolling walker (2 wheels) Gait Pattern/deviations: Step-through pattern, Decreased stride length, Trunk flexed, Shuffle Gait velocity: Decreased Gait velocity interpretation: <1.31 ft/sec, indicative of household ambulator   General Gait Details: Chair follow utilized. Pt requiring min-mod assist for balance support, navigating with RW. Multimodal cues for improved posture, closer walker proximity and forward gaze.   Stairs Stairs:  (Unable to progress to stair training at this time.)           Wheelchair Mobility     Tilt Bed    Modified Rankin (Stroke Patients Only)       Balance Overall balance assessment: Needs assistance Sitting-balance support: Feet supported, No upper extremity supported Sitting balance-Leahy Scale: Fair     Standing balance support: Bilateral upper extremity supported, Single extremity supported, During functional activity Standing balance-Leahy Scale: Poor  Cognition Arousal: Alert Behavior During Therapy: WFL for tasks assessed/performed Overall Cognitive Status: Impaired/Different from baseline Area of Impairment: Orientation, Attention, Memory, Following commands, Safety/judgement, Awareness, Problem  solving                 Orientation Level: Disoriented to, Situation, Time, Place Current Attention Level: Focused Memory: Decreased recall of precautions, Decreased short-term memory Following Commands: Follows one step commands inconsistently, Follows one step commands with increased time, Follows multi-step commands inconsistently Safety/Judgement: Decreased awareness of safety, Decreased awareness of deficits Awareness: Intellectual Problem Solving: Slow processing, Decreased initiation, Difficulty sequencing, Requires verbal cues, Requires tactile cues General Comments: pt perseverating on I didn't hurt anyone, I didn't do anything.        Exercises      General Comments        Pertinent Vitals/Pain Pain Assessment Pain Assessment: Faces Faces Pain Scale: Hurts a little bit Pain Location: R hip Pain Descriptors / Indicators: Operative site guarding Pain Intervention(s): Limited activity within patient's tolerance, Monitored during session, Repositioned    Home Living Family/patient expects to be discharged to:: Private residence Living Arrangements: Spouse/significant other Available Help at Discharge: Family Type of Home: House Home Access: Stairs to enter Entrance Stairs-Rails: Right;Left (too wide) Entrance Stairs-Number of Steps: 1 from the garage into the house Alternate Level Stairs-Number of Steps: 13 upstiars to bed/bath. Does have a murphy bed downstairs in a sunken room - 1 step down. Home Layout: Two level;1/2 bath on main level;Able to live on main level with bedroom/bathroom Home Equipment: Rolling Walker (2 wheels)      Prior Function            PT Goals (current goals can now be found in the care plan section) Acute Rehab PT Goals Patient Stated Goal: None stated PT Goal Formulation: With patient Time For Goal Achievement: 12/01/23 Potential to Achieve Goals: Good Progress towards PT goals: Progressing toward goals     Frequency    Min 1X/week      PT Plan      Co-evaluation              AM-PAC PT 6 Clicks Mobility   Outcome Measure  Help needed turning from your back to your side while in a flat bed without using bedrails?: A Little Help needed moving from lying on your back to sitting on the side of a flat bed without using bedrails?: A Little Help needed moving to and from a bed to a chair (including a wheelchair)?: A Little Help needed standing up from a chair using your arms (e.g., wheelchair or bedside chair)?: A Little Help needed to walk in hospital room?: A Little Help needed climbing 3-5 steps with a railing? : A Little 6 Click Score: 18    End of Session Equipment Utilized During Treatment: Gait belt Activity Tolerance: Patient tolerated treatment well Patient left: in chair;with call bell/phone within reach;with chair alarm set Nurse Communication: Mobility status PT Visit Diagnosis: Unsteadiness on feet (R26.81);History of falling (Z91.81);Difficulty in walking, not elsewhere classified (R26.2)     Time: 1011-1030 PT Time Calculation (min) (ACUTE ONLY): 19 min  Charges:    $Gait Training: 8-22 mins PT General Charges $$ ACUTE PT VISIT: 1 Visit                     Leita Sable, PT, DPT Acute Rehabilitation Services Secure Chat Preferred Office: [phone]    Leita JONETTA Sable 11/18/2023, 11:32 AM

## 2023-11-18 NOTE — Progress Notes (Signed)
 Subjective: Patient still confused. Thinks he is at home. Does know he had surgery for an injury to his L hip. Reports pain as mild to moderate.  Tolerating diet.  Urinating.   No CP, SOB.  Has mobilized some OOB with PT.  Objective:   VITALS:   Vitals:   11/17/23 1520 11/17/23 2000 11/18/23 0500 11/18/23 0714  BP: (!) 102/59 121/76 (!) 107/58 134/64  Pulse: 80 74 86 100  Resp: 17 18 16 17   Temp: 98.2 F (36.8 C) 98.4 F (36.9 C) 99 F (37.2 C) 98.1 F (36.7 C)  TempSrc: Oral Oral Oral Oral  SpO2: 99% 99% 97% 96%  Weight:      Height:          Latest Ref Rng & Units 11/18/2023    6:03 AM 11/17/2023    9:04 AM 11/16/2023    6:00 AM  CBC  WBC 4.0 - 10.5 K/uL 11.4  11.1  9.6   Hemoglobin 13.0 - 17.0 g/dL 8.0  7.7  9.0   Hematocrit 39.0 - 52.0 % 23.5  22.6  26.2   Platelets 150 - 400 K/uL 170  196  208       Latest Ref Rng & Units 11/18/2023    6:03 AM 11/17/2023    7:43 AM 11/16/2023    6:00 AM  BMP  Glucose 70 - 99 mg/dL 898  896  878   BUN 8 - 23 mg/dL 47  18  40   Creatinine 0.61 - 1.24 mg/dL 7.84  8.79  8.05   Sodium 135 - 145 mmol/L 140  137  140   Potassium 3.5 - 5.1 mmol/L 4.1  4.1  4.1   Chloride 98 - 111 mmol/L 102  108  105   CO2 22 - 32 mmol/L 25  21  25    Calcium  8.9 - 10.3 mg/dL 9.2  7.8  9.1    Intake/Output      02/03 0701 02/04 0700 02/04 0701 02/05 0700   P.O. 240    I.V. (mL/kg)     IV Piggyback     Total Intake(mL/kg) 240 (3.6)    Urine (mL/kg/hr) 900 (0.6)    Blood     Total Output 900    Net -660            Physical Exam: General: NAD.  Sitting up in bed, calm, comfortable Resp: No increased wob Cardio: regular rate and rhythm ABD soft Neurologically intact MSK Neurovascularly intact Sensation intact distally Intact pulses distally Dorsiflexion/Plantar flexion intact Incision: dressing has been slightly ripped by the tubing from the urinary catheter    Assessment: 2 Days Post-Op  S/P Procedure(s) (LRB): ARTHROPLASTY BIPOLAR  HIP (HEMIARTHROPLASTY) (Left) by Dr. Evalene BIRCH. Beverley on 11/16/23  Principal Problem:   Fracture of femoral neck, left, closed (HCC) Active Problems:   Abnormal head CT   Memory deficit   AKI (acute kidney injury) (HCC)   Plan:  Advance diet Up with therapy Incentive Spirometry Elevate and Apply ice  Weightbearing: WBAT LLE Insicional and dressing care: Dressings left intact until follow-up and Reinforce dressings as needed Orthopedic device(s): None Showering: Keep dressing dry VTE prophylaxis: Aspirin  81mg  qd  and Plavix  to be restarted POD 1 if Hgb >8 , SCDs, ambulation Pain control: PRN, limit narcotics as able due to confusion Follow - up plan: 2 weeks Contact information:  Evalene Beverley MD, Gerard Large PA-C  Dispo:  PT recommending HH vs SNF  Gerard CHRISTELLA Large, PA-C Office [phone] 11/18/2023, 10:38 AM

## 2023-11-18 NOTE — TOC CAGE-AID Note (Signed)
 Transition of Care [facility]) - CAGE-AID Screening   Patient Details  Name: William Todd MRN: [id number] Date of Birth: 1951-06-01  Transition of Care [facility]) CM/SW Contact:    LEBRON ROCKIE ORN, RN Phone Number: [phone] 11/18/2023, 6:50 PM   Clinical Narrative: Pt fell approx 2 weeks ago.  Pt is in hospital due to left femoral neck fx.  Pt has delirium and confusion.  Screening unable to be complete.    CAGE-AID Screening: Substance Abuse Screening unable to be completed due to: : Patient unable to participate

## 2023-11-18 NOTE — Evaluation (Signed)
 Occupational Therapy Evaluation Patient Details Name: William Todd MRN: [id number] DOB: Jun 18, 1951 Today's Date: 11/18/2023   History of Present Illness Pt is a 73 y/o male who presents 11/15/2023 after fall 2 weeks ago and inability to ambulate since that time. Pt had been refusing to be evaluated but wife brought pt into the ED. He was found to have an acute transverse fracture of the L femoral neck and is now s/p L hemiarthroplasty on 11/16/2023. PMH significant for anxiety, CKD, DM II, prostate CA s/p radiation, HTN, L foot fracture surgery (unknown date).   Clinical Impression   Patient admitted for above and limited by problem list below.  Pt reports independent with ADLs, using RW for mobility; driving at times and spouse completing IADLs.  He currently requires mod assist for bed mobility, min assist for transfers using RW, and min to total assist for Adls.  Reviewed precautions and safety, pt with poor safety awareness and decreased attention throughout session. Perseverating throughout session on I didn't hurt anyone and don't leave me. Question baseline cognition and vision, further assessment required.  Based on performance today, believe patient will best benefit from continued OT services acutely and after dc at inpatient setting with <3hrs/day to optimize independence, safety with ADLs and mobility.        If plan is discharge home, recommend the following: A little help with walking and/or transfers;A lot of help with bathing/dressing/bathroom;Assistance with cooking/housework;Direct supervision/assist for medications management;Direct supervision/assist for financial management;Assist for transportation;Supervision due to cognitive status;Help with stairs or ramp for entrance    Functional Status Assessment  Patient has had a recent decline in their functional status and demonstrates the ability to make significant improvements in function in a reasonable and predictable amount  of time.  Equipment Recommendations  BSC/3in1 (RW)    Recommendations for Other Services       Precautions / Restrictions Precautions Precautions: Fall;Posterior Hip Precaution Booklet Issued: Yes (comment) Precaution Comments: verbally reviewed, pt provided handout but he cannot see it Required Braces or Orthoses: Other Brace (AFO to L foot) Restrictions Weight Bearing Restrictions Per Provider Order: Yes LLE Weight Bearing Per Provider Order: Weight bearing as tolerated      Mobility Bed Mobility Overal bed mobility: Needs Assistance Bed Mobility: Supine to Sit     Supine to sit: Mod assist     General bed mobility comments: assist for sequencing and bringing BLEs towards EOB.    Transfers Overall transfer level: Needs assistance Equipment used: Rolling walker (2 wheels) Transfers: Sit to/from Stand Sit to Stand: Min assist           General transfer comment: cueing for hand placement and extension of L LE for hip precautions.  He requires min assist to steady and max cueing to sequence/safety transition to recliner.  Pt reaching out for trashcan at times.      Balance Overall balance assessment: Needs assistance Sitting-balance support: Feet supported, No upper extremity supported Sitting balance-Leahy Scale: Fair     Standing balance support: Bilateral upper extremity supported, Single extremity supported, During functional activity Standing balance-Leahy Scale: Poor                             ADL either performed or assessed with clinical judgement   ADL Overall ADL's : Needs assistance/impaired     Grooming: Minimal assistance;Sitting           Upper Body Dressing : Moderate assistance;Sitting  Lower Body Dressing: Total assistance;Sit to/from stand   Toilet Transfer: Minimal assistance;Ambulation;Rolling walker (2 wheels)   Toileting- Clothing Manipulation and Hygiene: Total assistance;Sit to/from stand       Functional  mobility during ADLs: Minimal assistance;Moderate assistance;Rolling walker (2 wheels);Cueing for sequencing;Cueing for safety       Vision Baseline Vision/History: 1 Wears glasses Ability to See in Adequate Light: 1 Impaired Patient Visual Report: No change from baseline Vision Assessment?: Vision impaired- to be further tested in functional context Additional Comments: pt reaching out for items when standing, requires hand over hand support to locate chair but able to see items on tray and self feed with increased time.  When looking at hip precautions paper- pt reading [employer].     Perception         Praxis         Pertinent Vitals/Pain Pain Assessment Pain Assessment: No/denies pain     Extremity/Trunk Assessment Upper Extremity Assessment Upper Extremity Assessment: Generalized weakness;Right hand dominant   Lower Extremity Assessment Lower Extremity Assessment: Defer to PT evaluation   Cervical / Trunk Assessment Cervical / Trunk Assessment: Other exceptions Cervical / Trunk Exceptions: Forward head posture with rounded shoulders   Communication Communication Communication: Difficulty following commands/understanding;Difficulty communicating thoughts/reduced clarity of speech Following commands: Follows one step commands inconsistently;Follows one step commands with increased time Cueing Techniques: Verbal cues;Gestural cues;Tactile cues   Cognition Arousal: Alert Behavior During Therapy: Anxious Overall Cognitive Status: Impaired/Different from baseline Area of Impairment: Orientation, Attention, Memory, Following commands, Safety/judgement, Awareness, Problem solving                 Orientation Level: Disoriented to, Situation, Time, Place Current Attention Level: Focused Memory: Decreased recall of precautions, Decreased short-term memory Following Commands: Follows one step commands inconsistently, Follows one step commands with increased  time, Follows multi-step commands inconsistently Safety/Judgement: Decreased awareness of safety, Decreased awareness of deficits Awareness: Intellectual Problem Solving: Slow processing, Decreased initiation, Difficulty sequencing, Requires verbal cues, Requires tactile cues General Comments: pt perseverating on I didn't hurt anyone I don't want to be by myself and don't leave.  Patient requires cueing for safety and probelm sovling, requires redirection frequently     General Comments       Exercises     Shoulder Instructions      Home Living Family/patient expects to be discharged to:: Private residence Living Arrangements: Spouse/significant other Available Help at Discharge: Family Type of Home: House Home Access: Stairs to enter [employer] of Steps: 1 from the garage into the house Entrance Stairs-Rails: Right;Left (too wide) Home Layout: Two level;1/2 bath on main level;Able to live on main level with bedroom/bathroom Alternate Level Stairs-Number of Steps: 13 upstiars to bed/bath. Does have a murphy bed downstairs in a sunken room - 1 step down.   Bathroom Shower/Tub: Tub/shower unit;Walk-in shower (Uses the walk in shower the most. Has a shower seat)   Bathroom Toilet: Standard     Home Equipment: [employer] (2 wheels)          Prior Functioning/Environment Prior Level of Function : Independent/Modified Independent             Mobility Comments: has AFO for L foot, uses cane but also reports ambulating without any AD ADLs Comments: independent ADLs, reports sometimes driving and spouse completes IADLS        OT Problem List: Decreased strength;Decreased activity tolerance;Impaired balance (sitting and/or standing);Decreased knowledge of precautions;Decreased knowledge of use of DME or  AE;Decreased safety awareness;Decreased cognition      OT Treatment/Interventions: Therapeutic exercise;Self-care/ADL training;DME and/or AE  instruction;Therapeutic activities;Patient/family education;Balance training    OT Goals(Current goals can be found in the care plan section) Acute Rehab OT Goals Patient Stated Goal: be home with my family OT Goal Formulation: With patient Time For Goal Achievement: 12/02/23 Potential to Achieve Goals: Good  OT Frequency: Min 1X/week    Co-evaluation              AM-PAC OT 6 Clicks Daily Activity     Outcome Measure Help from another person eating meals?: A Little Help from another person taking care of personal grooming?: A Little Help from another person toileting, which includes using toliet, bedpan, or urinal?: Total Help from another person bathing (including washing, rinsing, drying)?: A Lot Help from another person to put on and taking off regular upper body clothing?: A Lot Help from another person to put on and taking off regular lower body clothing?: Total 6 Click Score: 12   End of Session Equipment Utilized During Treatment: Gait belt;Rolling walker (2 wheels) Nurse Communication: Mobility status  Activity Tolerance: Patient tolerated treatment well Patient left: in chair;with call bell/phone within reach;with chair alarm set;with nursing/sitter in room  OT Visit Diagnosis: Other abnormalities of gait and mobility (R26.89);Muscle weakness (generalized) (M62.81);Other symptoms and signs involving cognitive function                Time: 0911-0950 OT Time Calculation (min): 39 min Charges:  OT General Charges $OT Visit: 1 Visit OT Evaluation $OT Eval Moderate Complexity: 1 Mod OT Treatments $Self Care/Home Management : 23-37 mins  Etta NOVAK, OT Acute Rehabilitation Services Office [phone]   Etta GORMAN Hope 11/18/2023, 11:06 AM

## 2023-11-18 NOTE — NC FL2 (Signed)
 [place]  MEDICAID FL2 LEVEL OF CARE FORM     IDENTIFICATION  Patient Name: William Todd Birthdate: 01-Jan-1951 Sex: male Admission Date (Current Location): 11/15/2023  [facility] and [place] Number:  Producer, Television/film/video and Address:  [facility] [place], [address] [place], [place] [address]      Provider Number: [id number]  Attending Physician Name and Address:  Kathrin Mignon DASEN, MD  Relative Name and Phone Number:  Zyeir, Dymek [phone]  [phone]    Current Level of Care: Hospital Recommended Level of Care: Skilled Nursing Facility Prior Approval Number:    Date Approved/Denied:   PASRR Number:    Discharge Plan: SNF    Current Diagnoses: Patient Active Problem List   Diagnosis Date Noted   Fracture of femoral neck, left, closed (HCC) 11/15/2023   Abnormal head CT 11/15/2023   Memory deficit 11/15/2023   AKI (acute kidney injury) (HCC) 11/15/2023   Prostate cancer (HCC) 07/15/2013    Orientation RESPIRATION BLADDER Height & Weight     Self  Normal Incontinent Weight: 147 lb (66.7 kg) Height:  6' 0.01 (182.9 cm)  BEHAVIORAL SYMPTOMS/MOOD NEUROLOGICAL BOWEL NUTRITION STATUS      Continent Diet (see discharge summary)  AMBULATORY STATUS COMMUNICATION OF NEEDS Skin   Limited Assist   Surgical wounds, Skin abrasions                       Personal Care Assistance Level of Assistance  Bathing, Feeding, Dressing, Total care Bathing Assistance: Maximum assistance Feeding assistance: Limited assistance Dressing Assistance: Maximum assistance Total Care Assistance: Maximum assistance   Functional Limitations Info  Sight, Hearing, Speech Sight Info: Adequate Hearing Info: Adequate Speech Info: Adequate    SPECIAL CARE FACTORS FREQUENCY  PT (By licensed PT), OT (By licensed OT)     PT Frequency: 5x week OT Frequency: 5x week            Contractures Contractures Info: Not present    Additional Factors Info  Code  Status, Allergies, Insulin  Sliding Scale Code Status Info: full Allergies Info: NKA   Insulin  Sliding Scale Info: Novolog : see discharge summary       Current Medications (11/18/2023):  This is the current hospital active medication list Current Facility-Administered Medications  Medication Dose Route Frequency Provider Last Rate Last Admin   acetaminophen  (TYLENOL ) tablet 650 mg  650 mg Oral Q6H WA Gonfa, Taye T, MD   650 mg at 11/18/23 1401   alum & mag hydroxide-simeth (MAALOX/MYLANTA) 200-200-20 MG/5ML suspension 30 mL  30 mL Oral Q4H PRN Gawne, Meghan M, PA-C       aspirin  EC tablet 81 mg  81 mg Oral BID Gonfa, Taye T, MD   81 mg at 11/18/23 9047   atorvastatin  (LIPITOR ) tablet 40 mg  40 mg Oral q1800 Gawne, Meghan M, PA-C   40 mg at 11/17/23 1725   bisacodyl  (DULCOLAX) suppository 10 mg  10 mg Rectal Daily PRN Gawne, Meghan M, PA-C       calcium  carbonate (OS-CAL - dosed in mg of elemental calcium ) tablet 1,250 mg  1 tablet Oral BID WC Gawne, Meghan M, PA-C   1,250 mg at 11/18/23 9047   cholecalciferol  (VITAMIN D3) 25 MCG (1000 UNIT) tablet 1,000 Units  1,000 Units Oral Daily Gawne, Meghan M, PA-C   1,000 Units at 11/18/23 9047   docusate sodium  (COLACE) capsule 100 mg  100 mg Oral BID Gawne, Meghan M, PA-C  100 mg at 11/18/23 9047   insulin  aspart (novoLOG ) injection 0-5 Units  0-5 Units Subcutaneous QHS Gonfa, Taye T, MD       insulin  aspart (novoLOG ) injection 0-9 Units  0-9 Units Subcutaneous TID WC Gonfa, Taye T, MD   3 Units at 11/18/23 1148   menthol -cetylpyridinium (CEPACOL) lozenge 3 mg  1 lozenge Oral PRN Gawne, Meghan M, PA-C       Or   phenol (CHLORASEPTIC) mouth spray 1 spray  1 spray Mouth/Throat PRN Gawne, Meghan M, PA-C       methocarbamol  (ROBAXIN ) tablet 500 mg  500 mg Oral Q6H PRN Gawne, Meghan M, PA-C   500 mg at 11/18/23 9047   Or   methocarbamol  (ROBAXIN ) injection 500 mg  500 mg Intravenous Q6H PRN Gawne, Meghan M, PA-C       metoCLOPramide  (REGLAN ) tablet 5-10  mg  5-10 mg Oral Q8H PRN Gawne, Meghan M, PA-C       Or   metoCLOPramide  (REGLAN ) injection 5-10 mg  5-10 mg Intravenous Q8H PRN Gawne, Meghan M, PA-C       morphine  (PF) 2 MG/ML injection 0.5-1 mg  0.5-1 mg Intravenous Q2H PRN Gawne, Meghan M, PA-C       mupirocin  ointment (BACTROBAN ) 2 % 1 Application  1 Application Nasal BID Gawne, Meghan M, PA-C   1 Application at 11/18/23 0951   ondansetron  (ZOFRAN ) tablet 4 mg  4 mg Oral Q6H PRN Gawne, Meghan M, PA-C       Or   ondansetron  (ZOFRAN ) injection 4 mg  4 mg Intravenous Q6H PRN Gawne, Meghan M, PA-C       oxyCODONE  (Oxy IR/ROXICODONE ) immediate release tablet 5 mg  5 mg Oral Q6H PRN Gonfa, Taye T, MD       pantoprazole  (PROTONIX ) EC tablet 40 mg  40 mg Oral Daily Gawne, Meghan M, PA-C   40 mg at 11/18/23 0952   polyethylene glycol (MIRALAX  / GLYCOLAX ) packet 17 g  17 g Oral Daily PRN Gawne, Meghan M, PA-C       sodium chloride  flush (NS) 0.9 % injection 3 mL  3 mL Intravenous Q12H Gawne, Meghan M, PA-C   3 mL at 11/18/23 9045   traMADol  (ULTRAM ) tablet 50 mg  50 mg Oral Q8H PRN Gonfa, Taye T, MD         Discharge Medications: Please see discharge summary for a list of discharge medications.  Relevant Imaging Results:  Relevant Lab Results:   Additional Information SSN: [id number]  [name], LCSW

## 2023-11-18 NOTE — Progress Notes (Signed)
 Speech Language Pathology Treatment: Dysphagia  Patient Details Name: William Todd MRN: [id number] DOB: 02-Jan-1951 Today's Date: 11/18/2023 Time: 8944-8896 SLP Time Calculation (min) (ACUTE ONLY): 8 min  Assessment / Plan / Recommendation Clinical Impression  Oral dysphagia has resolved. Pt presented today with normal attention to POs, adequate coordination and manipulation of solids, no further oral groping. No oral residue post swallow.  No concerns for aspiration when drinking sequential swallows of thin liquid.  D/W wife at bedside. No further f/u for swallowing - our service will sign off.  Mentation is still not at baseline; if MS does not resolve in a few days and SLP cognitive eval is warranted, please order.    HPI HPI: William Todd is a 73 y.o. male with hx of CAD, hypertension, hyperlipidemia, diabetes, prostate cancer, who was brought in after fall 2 weeks ago and inability to ambulate since this time.  Patient had been refusing to be evaluated but wife had enough and decided to bring in this evening.  Patient is unable to provide any coherent history about why he is in the hospital today or what has been happening recently.  His wife reports 2 weeks ago he had walked up the stairs began to feel lightheaded, reach out for the wall but missed and fell forward.  He had complained of pain in the left lower extremity but this had improved.  Was unable to ambulate since the fall.  Otherwise wife notes that he sometimes is talking out of his head, no known history of dementia.  He has significant memory impairment regarding short-term events.  No headache, speech changes, vision changes (chronic visual loss), numbness or focal weakness (apart from inability to ambulate on the left leg) in his extremities.  No other recent illness. MRI Brain negative for acute intracranial abnormality.      SLP Plan  All goals met      Recommendations for follow up therapy are one component of a  multi-disciplinary discharge planning process, led by the attending physician.  Recommendations may be updated based on patient status, additional functional criteria and insurance authorization.    Recommendations  Diet recommendations: Regular;Thin liquid Liquids provided via: Cup;Straw Medication Administration: Whole meds with liquid Supervision: Patient able to self feed                  Oral care BID     Dysphagia, oral phase (R13.11)     All goals met    William Qualley L. Vona, MA CCC/SLP Clinical Specialist - Acute Care SLP Acute Rehabilitation Services Office number [phone]  William Todd  11/18/2023, 11:09 AM

## 2023-11-18 NOTE — TOC Initial Note (Signed)
 Transition of Care [facility]) - Initial/Assessment Note    Patient Details  Name: William Todd MRN: [id number] Date of Birth: 02-14-1951  Transition of Care [facility]) CM/SW Contact:    [name], LCSW Phone Number: 11/18/2023, 2:06 PM  Clinical Narrative:     Pt oriented x1 in epic.  CSW spoke with pt and wife [name] in room, all information from Joann.  Pt from home with wife, no current services.  Discussed PT recommendation for SNF and wife does want to pursue this.  Medicare choice document provided, permission given to sent out referral in hub.    Referral sent out in hub. PASSR requires additional information.                Expected Discharge Plan: Skilled Nursing Facility Barriers to Discharge: Continued Medical Work up, SNF Pending bed offer   Patient Goals and CMS Choice   CMS Medicare.gov Compare Post Acute Care list provided to:: Patient Represenative (must comment) (wife Joann) Choice offered to / list presented to : Spouse      Expected Discharge Plan and Services In-house Referral: Clinical Social Work   Post Acute Care Choice: Skilled Nursing Facility Living arrangements for the past 2 months: Single Family Home                                      Prior Living Arrangements/Services Living arrangements for the past 2 months: Single Family Home Lives with:: Spouse Patient language and need for interpreter reviewed:: Yes        Need for Family Participation in Patient Care: Yes (Comment) Care giver support system in place?: Yes (comment) Current home services: Other (comment) (none) Criminal Activity/Legal Involvement Pertinent to Current Situation/Hospitalization: No - Comment as needed  Activities of Daily Living   ADL Screening (condition at time of admission) Independently performs ADLs?: Yes (appropriate for developmental age) Is the patient deaf or have difficulty hearing?: No Does the patient have difficulty seeing, even when wearing  glasses/contacts?: No Does the patient have difficulty concentrating, remembering, or making decisions?: Yes  Permission Sought/Granted                  Emotional Assessment Appearance:: Appears stated age Attitude/Demeanor/Rapport: Engaged Affect (typically observed): Appropriate, Pleasant Orientation: : Oriented to Self      Admission diagnosis:  Fracture of femoral neck, left, closed (HCC) [S72.002A] Patient Active Problem List   Diagnosis Date Noted   Fracture of femoral neck, left, closed (HCC) 11/15/2023   Abnormal head CT 11/15/2023   Memory deficit 11/15/2023   AKI (acute kidney injury) (HCC) 11/15/2023   Prostate cancer (HCC) 07/15/2013   PCP:  Joshua Francisco, MD Pharmacy:   [phone] [place], [place] - [address] [place] [address] Phone: [phone] Fax: [phone]  [id number] [place] [facility] - [address] [place] [address] Phone: [phone] Fax: [phone]     Social Drivers of Health (SDOH) Social History: SDOH Screenings   Food Insecurity: No Food Insecurity (11/16/2023)  Housing: Low Risk  (11/16/2023)  Transportation Needs: No Transportation Needs (11/16/2023)  Utilities: Not At Risk (11/16/2023)  Social Connections: Moderately Integrated (11/16/2023)  Tobacco Use: Low Risk  (11/16/2023)   SDOH Interventions:     Readmission Risk Interventions     No data to display

## 2023-11-19 DIAGNOSIS — S72002D Fracture of unspecified part of neck of left femur, subsequent encounter for closed fracture with routine healing: Secondary | ICD-10-CM | POA: Diagnosis not present

## 2023-11-19 DIAGNOSIS — N179 Acute kidney failure, unspecified: Secondary | ICD-10-CM | POA: Diagnosis not present

## 2023-11-19 DIAGNOSIS — E440 Moderate protein-calorie malnutrition: Secondary | ICD-10-CM | POA: Insufficient documentation

## 2023-11-19 DIAGNOSIS — R413 Other amnesia: Secondary | ICD-10-CM | POA: Diagnosis not present

## 2023-11-19 DIAGNOSIS — R930 Abnormal findings on diagnostic imaging of skull and head, not elsewhere classified: Secondary | ICD-10-CM | POA: Diagnosis not present

## 2023-11-19 LAB — RENAL FUNCTION PANEL
Albumin: 2.6 g/dL — ABNORMAL LOW (ref 3.5–5.0)
Anion gap: 9 (ref 5–15)
BUN: 34 mg/dL — ABNORMAL HIGH (ref 8–23)
CO2: 25 mmol/L (ref 22–32)
Calcium: 9 mg/dL (ref 8.9–10.3)
Chloride: 103 mmol/L (ref 98–111)
Creatinine, Ser: 1.68 mg/dL — ABNORMAL HIGH (ref 0.61–1.24)
GFR, Estimated: 43 mL/min — ABNORMAL LOW (ref >60)
Glucose, Bld: 150 mg/dL — ABNORMAL HIGH (ref 70–99)
Phosphorus: 2.3 mg/dL — ABNORMAL LOW (ref 2.5–4.6)
Potassium: 4.3 mmol/L (ref 3.5–5.1)
Sodium: 137 mmol/L (ref 135–145)

## 2023-11-19 LAB — PREPARE RBC (CROSSMATCH)

## 2023-11-19 LAB — CBC
HCT: 22.1 % — ABNORMAL LOW (ref 39.0–52.0)
Hemoglobin: 7.4 g/dL — ABNORMAL LOW (ref 13.0–17.0)
MCH: 30.3 pg (ref 26.0–34.0)
MCHC: 33.5 g/dL (ref 30.0–36.0)
MCV: 90.6 fL (ref 80.0–100.0)
Platelets: 157 10*3/uL (ref 150–400)
RBC: 2.44 MIL/uL — ABNORMAL LOW (ref 4.22–5.81)
RDW: 12.7 % (ref 11.5–15.5)
WBC: 8.9 10*3/uL (ref 4.0–10.5)
nRBC: 0 % (ref 0.0–0.2)

## 2023-11-19 LAB — GLUCOSE, CAPILLARY
Glucose-Capillary: 139 mg/dL — ABNORMAL HIGH (ref 70–99)
Glucose-Capillary: 209 mg/dL — ABNORMAL HIGH (ref 70–99)
Glucose-Capillary: 192 mg/dL — ABNORMAL HIGH (ref 70–99)
Glucose-Capillary: 123 mg/dL — ABNORMAL HIGH (ref 70–99)

## 2023-11-19 LAB — MAGNESIUM: Magnesium: 1.9 mg/dL (ref 1.7–2.4)

## 2023-11-19 MED ORDER — TRAMADOL HCL 50 MG PO TABS: 50.0000 mg | ORAL_TABLET | Freq: Three times a day (TID) | ORAL | 0 refills | Status: AC | PRN

## 2023-11-19 MED ORDER — AMLODIPINE BESYLATE 5 MG PO TABS: 5.0000 mg | ORAL_TABLET | Freq: Every day | ORAL | Status: DC

## 2023-11-19 MED ORDER — INSULIN ASPART 100 UNIT/ML IJ SOLN
0.0000 [IU] | Freq: Three times a day (TID) | INTRAMUSCULAR | Status: DC
Start: 2023-11-19 — End: 2023-11-20
  Administered 2023-11-19: 3 [IU] via SUBCUTANEOUS
  Administered 2023-11-20: 5 [IU] via SUBCUTANEOUS

## 2023-11-19 MED ORDER — INSULIN GLARGINE-YFGN 100 UNIT/ML ~~LOC~~ SOLN
8.0000 [IU] | Freq: Every day | SUBCUTANEOUS | Status: DC
  Administered 2023-11-19 – 2023-11-20 (×2): 8 [IU] via SUBCUTANEOUS
  Filled 2023-11-19 (×2): qty 0.08

## 2023-11-19 MED ORDER — ASPIRIN 81 MG PO TBEC: DELAYED_RELEASE_TABLET | ORAL | Status: DC

## 2023-11-19 MED ORDER — SENNOSIDES-DOCUSATE SODIUM 8.6-50 MG PO TABS: 1.0000 | ORAL_TABLET | Freq: Two times a day (BID) | ORAL | Status: DC | PRN

## 2023-11-19 MED ORDER — SODIUM CHLORIDE 0.9% IV SOLUTION
Freq: Once | INTRAVENOUS | Status: AC
Start: 2023-11-19 — End: 2023-11-19

## 2023-11-19 MED ORDER — POLYETHYLENE GLYCOL 3350 17 GM/SCOOP PO POWD: 17.0000 g | Freq: Two times a day (BID) | ORAL | Status: DC | PRN

## 2023-11-19 MED ORDER — ACETAMINOPHEN 325 MG PO TABS: ORAL_TABLET | ORAL | Status: DC

## 2023-11-19 MED ORDER — LINAGLIPTIN 5 MG PO TABS
5.0000 mg | ORAL_TABLET | Freq: Every day | ORAL | Status: DC
  Administered 2023-11-19 – 2023-11-20 (×2): 5 mg via ORAL
  Filled 2023-11-19 (×2): qty 1

## 2023-11-19 NOTE — Care Management Important Message (Signed)
Important Message  Patient Details  Name: William Todd MRN: [id number] Date of Birth: May 18, 1951   Important Message Given:  Yes - Medicare IM     [name] 11/19/2023, 2:55 PM

## 2023-11-19 NOTE — Progress Notes (Signed)
 RE:  William Todd      Date of Birth:  02/04/1951     Date:   11/19/23       To Whom It May Concern:  Please be advised that the above-named patient will require a short-term nursing home stay - anticipated 30 days or less for rehabilitation and strengthening.  The plan is for return home.                 MD signature                Date

## 2023-11-19 NOTE — Progress Notes (Signed)
 Initial Nutrition Assessment  DOCUMENTATION CODES:   Non-severe (moderate) malnutrition in context of chronic illness  INTERVENTION:  Ensure Enlive po BID, each supplement provides 350 kcal and 20 grams of protein. Magic cup TID with meals, each supplement provides 290 kcal and 9 grams of protein Recommend obtaining new measured weight MVI with minerals daily  NUTRITION DIAGNOSIS:   Moderate Malnutrition related to chronic illness as evidenced by severe muscle depletion, moderate muscle depletion, severe fat depletion, moderate fat depletion.  GOAL:   Patient will meet greater than or equal to 90% of their needs  MONITOR:   PO intake, Supplement acceptance, Weight trends  REASON FOR ASSESSMENT:   Consult Assessment of nutrition requirement/status  ASSESSMENT:   Pt admitted 2 weeks after fall at home and found to have left closed femoral neck fracture. Pt with PMH of CAD, HTN, HLD, DM2, prostate cancer (radiation in 2024/2015), anxiety. Pt has significant memory impairment in the short term but no hx of dementia   2/2: Left hip arthroplasty *Expected discharge to nursing facility.  Pt on regular diet and eating an average of 50% of his meals the past 2 days. Pt appeared to be confused during visit-unsure of accuracy of nutrition hx obtained. Pt's breakfast tray was at bedside during visit. Pt ate 100% of his pancakes, a couple pieces of bacon, and coffee. Pt reports his appetite has been great at home and that wife Elijah cooks for him. He reports he eats 2 meals a day at home which include B eggs, toast, coffee. D wife cooks a home cooked meal. He reports he does not usually eat lunch and snacks on packaged items during the day between breakfast and dinner. Pt reports he loves protein drinks like Ensure and Boost but does not remember if he drinks them at home. Pt seemed eager to try protein drinks during admission. Pt also loves ice cream and reports he looks forward to trying  the magic cups. Pt reports he takes vitamins at home but is unsure of which ones he takes. He reports that he only knows it contains vitamin C.  Admit weight: 149.47 lb -Pt unable to recall what he usually weighs. -No recent weight hx documented since 2015, where he was about 20 lbs heavier. No updated weights have been obtained during stay-unable to weigh pt in bed  Meds: Vitamin D3 25 mcg, Colace 100 mg BID, Novolog  0-5 units daily, Novolog  0-9 units TID, Protonix  40 mg daily  Labs: CBG ranges from 100-248 in last 24 hrs, phosphorus low, A1C 6.4  NUTRITION - FOCUSED PHYSICAL EXAM:  Flowsheet Row Most Recent Value  Orbital Region Moderate depletion  Upper Arm Region Severe depletion  Thoracic and Lumbar Region Moderate depletion  Buccal Region Moderate depletion  Temple Region Mild depletion  Clavicle Bone Region Moderate depletion  Clavicle and Acromion Bone Region Severe depletion  Scapular Bone Region Severe depletion  Dorsal Hand Severe depletion  Patellar Region Severe depletion  Anterior Thigh Region Severe depletion  Posterior Calf Region Moderate depletion  [unable to assess left calf]  Edema (RD Assessment) None  Hair Reviewed  Eyes Reviewed  Mouth Reviewed  Skin Reviewed  Nails Reviewed       Diet Order:   Diet Order             Diet - low sodium heart healthy           Diet Carb Modified           Diet regular  Fluid consistency: Thin  Diet effective now                   EDUCATION NEEDS:   Education needs have been addressed  Skin:  Skin Assessment: Reviewed RN Assessment  Last BM:  2/2  Height:   Ht Readings from Last 1 Encounters:  11/16/23 6' 0.01 (1.829 m)    Weight:   Wt Readings from Last 1 Encounters:  11/16/23 66.7 kg    Ideal Body Weight:  83.6 kg  BMI:  Body mass index is 19.93 kg/m.  Estimated Nutritional Needs:   Kcal:  1800-2000  Protein:  90-100 g  Fluid:  1.8-2.0 L    Tinnie Ferraris, MS Dietetic  Intern

## 2023-11-19 NOTE — TOC Progression Note (Addendum)
 Transition of Care [facility]) - Progression Note    Patient Details  Name: William Todd MRN: [id number] Date of Birth: 06-11-1951  [facility] CM/SW Contact  [name], LCSW Phone Number: 11/19/2023, 12:56 PM  Clinical Narrative:   Bed offers provided to pt wife Elijah.  She is asking for response from [facility].  CSW reached out to Tracy[facility] to review.   1520: [facility] cannot offer bed.  Wife updated.  She will accept offer at [facility].    CSW confirmed with Ellen/[facility].  SNF auth request submitted in navi and approved: [id number], 5 days: 2/6-2/10.  Expected Discharge Plan: Skilled Nursing Facility Barriers to Discharge: Continued Medical Work up, SNF Pending bed offer  Expected Discharge Plan and Services In-house Referral: Clinical Social Work   Post Acute Care Choice: Skilled Nursing Facility Living arrangements for the past 2 months: Single Family Home                                       Social Determinants of Health (SDOH) Interventions SDOH Screenings   Food Insecurity: No Food Insecurity (11/16/2023)  Housing: Low Risk  (11/16/2023)  Transportation Needs: No Transportation Needs (11/16/2023)  Utilities: Not At Risk (11/16/2023)  Social Connections: Moderately Integrated (11/16/2023)  Tobacco Use: Low Risk  (11/16/2023)    Readmission Risk Interventions     No data to display

## 2023-11-19 NOTE — Progress Notes (Signed)
 PROGRESS NOTE  William Todd FMW:[id number] DOB: 1951-01-10   PCP: Joshua Francisco, MD  Patient is from: Lives with wife.  Independently ambulates at baseline.  DOA: 11/15/2023 LOS: 4  Chief complaints Chief Complaint  Patient presents with   Fall     Brief Narrative / Interim history: 73 year old M with PMH of DM-2, prostate cancer, HTN, HLD, left foot fracture with prosthesis and cognitive impairment brought to ED due to inability to ambulate after he sustained a fall about 2 weeks prior.  Patient has been refusing to be evaluated but wife had enough and decided to bring him to the hospital. He has significant memory impairment regarding short-term events.  X-ray showed an acute transverse fracture of the left femoral neck with varus angulation of the fracture fragments.  Orthopedics consulted.  He underwent hemiarthroplasty on 2/2.  He also had AKI that has resolved.   Postop course complicated by delirium/confusion.  Therapy recommended SNF.  Subjective: Seen and examined earlier this morning.  No major events overnight of this morning.  No complaints but not a great historian.  He denies pain.  Does not appear to be in distress.  Objective: Vitals:   11/19/23 0500 11/19/23 0756 11/19/23 1300 11/19/23 1337  BP: 130/62 128/75 110/64 106/66  Pulse: 91 99 98 (!) 106  Resp: 16 17 16 16   Temp: 98.9 F (37.2 C) 97.7 F (36.5 C) 98 F (36.7 C) 98.1 F (36.7 C)  TempSrc: Oral Oral Oral   SpO2: 98% 100% 100% 100%  Weight:      Height:        Examination:  GENERAL: No apparent distress.  Nontoxic. HEENT: MMM.  Vision and hearing grossly intact.  NECK: Supple.  No apparent JVD.  RESP:  No IWOB.  Fair aeration bilaterally. CVS:  RRR. Heart sounds normal.  ABD/GI/GU: BS+. Abd soft, NTND.  MSK/EXT:  Moves extremities.  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  Dressing over left hip DCI. SKIN: As above. NEURO: Awake and alert.  Oriented x 4 but confused to situation.   No apparent focal neuro deficit. PSYCH: Appears anxious.  Procedures:  2/2-left hip hemiarthroplasty by Dr. Beverley  Microbiology summarized: Surgical MRSA PCR negative.  Assessment and plan: Accidental fall at home-fell about 2 weeks ago.   Has left foot fracture and wears prosthesis. Traumatic left femoral neck fracture due to accidental fall at home.  X-ray confirmed fracture.  Vitamin D  level 52. -S/p left hemiarthroplasty on 2/2 -Pain control and VTE prophylaxis per surgery. -PT/OT recommended SNF. -Fall precaution     Essential hypertension: Normotensive for most part. -Continue holding home antihypertensive meds    AKI?  CKD-4?  AKI improved. Recent Labs    11/15/23 1720 11/16/23 0600 11/17/23 0743 11/18/23 0603 11/19/23 0554  BUN 45* 40* 18 47* 34*  CREATININE 2.49* 1.94* 1.20 2.15* 1.68*  -Avoid nephrotoxic meds -Continue monitoring  NIDDM-2 with hyperglycemia: A1c 6.4%. Recent Labs  Lab 11/18/23 1119 11/18/23 1611 11/18/23 2108 11/19/23 0618 11/19/23 1116  GLUCAP 238* 150* 165* 139* 209*  -Change diet to carb modified -Start Tradjenta  for home Januvia -Increase SSI to moderate -Add Semglee  8 units daily -Further adjustment as appropriate. -Hold home glimepiride    Normocytic anemia: Hgb dropped about 2 g postoperatively.  No report of EBL.  Unknown baseline.  Anemia panel consistent with anemia of chronic disease.  Recent Labs    11/15/23 1720 11/16/23 0600 11/17/23 0904 11/18/23 0603 11/19/23 0554  HGB 10.1* 9.0* 7.7* 8.0*  7.4*  -Transfuse 1 unit.  Wife previously agreed to blood transfusion.  Acute metabolic encephalopathy/delirium/dementia with behavioral disturbance: Seems to have resolved.  MRI brain without acute finding.  Currently AAO/4 but confused, anxious and apprehensive.  -Reorientation and delirium precaution -Minimize or avoid sedating medications. -Fall precautions.  Mood disorder: Stable.  No longer takes Lexapro   Moderate  malnutrition Body mass index is 19.93 kg/m. Nutrition Problem: Moderate Malnutrition Etiology: chronic illness Signs/Symptoms: severe muscle depletion, moderate muscle depletion, severe fat depletion, moderate fat depletion Interventions: Ensure Enlive (each supplement provides 350kcal and 20 grams of protein)   DVT prophylaxis:  SCDs Start: 11/17/23 0034  Code Status: Full code Family Communication: None at bedside today. Level of care: Med-Surg Status is: Inpatient Remains inpatient appropriate because: Left hip fracture, AKI and anemia  Final disposition: Likely SNF Consultants:  Orthopedic surgery  35 minutes with more than 50% spent in reviewing records, counseling patient/family and coordinating care.   Sch Meds:  Scheduled Meds:  acetaminophen   650 mg Oral Q6H WA   aspirin  EC  81 mg Oral BID   atorvastatin   40 mg Oral q1800   calcium  carbonate  1 tablet Oral BID WC   cholecalciferol   1,000 Units Oral Daily   docusate sodium   100 mg Oral BID   insulin  aspart  0-15 Units Subcutaneous TID WC   insulin  aspart  0-5 Units Subcutaneous QHS   insulin  glargine-yfgn  8 Units Subcutaneous Daily   linagliptin   5 mg Oral Daily   mupirocin  ointment  1 Application Nasal BID   pantoprazole   40 mg Oral Daily   sodium chloride  flush  3 mL Intravenous Q12H   Continuous Infusions: PRN Meds:.alum & mag hydroxide-simeth, bisacodyl , menthol -cetylpyridinium **OR** phenol, ondansetron  **OR** ondansetron  (ZOFRAN ) IV, oxyCODONE , polyethylene glycol, traMADol   Antimicrobials: Anti-infectives (From admission, onward)    Start     Dose/Rate Route Frequency Ordered Stop   11/17/23 0033  ceFAZolin  (ANCEF ) IVPB 2g/100 mL premix        2 g 200 mL/hr over 30 Minutes Intravenous Every 6 hours 11/17/23 0033 11/17/23 0549   11/16/23 1130  ceFAZolin  (ANCEF ) IVPB 2g/100 mL premix        2 g 200 mL/hr over 30 Minutes Intravenous  Once 11/16/23 1115 11/16/23 1313   11/16/23 1117  ceFAZolin  (ANCEF )  2-4 GM/100ML-% IVPB       Note to Pharmacy: Lebron Devere HERO: cabinet override      11/16/23 1117 11/16/23 1347        I have personally reviewed the following labs and images: CBC: Recent Labs  Lab 11/15/23 1720 11/16/23 0600 11/17/23 0904 11/18/23 0603 11/19/23 0554  WBC 12.6* 9.6 11.1* 11.4* 8.9  NEUTROABS 11.1*  --   --   --   --   HGB 10.1* 9.0* 7.7* 8.0* 7.4*  HCT 30.7* 26.2* 22.6* 23.5* 22.1*  MCV 92.5 90.7 89.7 90.4 90.6  PLT 245 208 196 170 157   BMP &GFR Recent Labs  Lab 11/15/23 1720 11/16/23 0600 11/17/23 0743 11/18/23 0603 11/19/23 0554  NA 137 140 137 140 137  K 4.5 4.1 4.1 4.1 4.3  CL 100 105 108 102 103  CO2 22 25 21* 25 25  GLUCOSE 323* 121* 103* 101* 150*  BUN 45* 40* 18 47* 34*  CREATININE 2.49* 1.94* 1.20 2.15* 1.68*  CALCIUM  9.4 9.1 7.8* 9.2 9.0  MG  --  2.2 2.0 2.0 1.9  PHOS  --  2.9 2.8 2.7 2.3*   Estimated  Creatinine Clearance: 36.9 mL/min (A) (by C-G formula based on SCr of 1.68 mg/dL (H)). Liver & Pancreas: Recent Labs  Lab 11/15/23 1720 11/17/23 0743 11/18/23 0603 11/19/23 0554  AST 45*  --   --   --   ALT 44  --   --   --   ALKPHOS 83  --   --   --   BILITOT 1.0  --   --   --   PROT 7.0  --   --   --   ALBUMIN  2.9* 2.2* 2.8* 2.6*   No results for input(s): LIPASE, AMYLASE in the last 168 hours. No results for input(s): AMMONIA in the last 168 hours. Diabetic: No results for input(s): HGBA1C in the last 72 hours.  Recent Labs  Lab 11/18/23 1119 11/18/23 1611 11/18/23 2108 11/19/23 0618 11/19/23 1116  GLUCAP 238* 150* 165* 139* 209*   Cardiac Enzymes: Recent Labs  Lab 11/15/23 1720  CKTOTAL 193   No results for input(s): PROBNP in the last 8760 hours. Coagulation Profile: Recent Labs  Lab 11/16/23 0600  INR 1.3*   Thyroid Function Tests: No results for input(s): TSH, T4TOTAL, FREET4, T3FREE, THYROIDAB in the last 72 hours.  Lipid Profile: No results for input(s): CHOL, HDL,  LDLCALC, TRIG, CHOLHDL, LDLDIRECT in the last 72 hours.  Anemia Panel: Recent Labs    11/17/23 1202  VITAMINB12 731  FOLATE 8.9  FERRITIN 329  TIBC 164*  IRON 36*  RETICCTPCT 2.4   Urine analysis:    Component Value Date/Time   COLORURINE YELLOW 11/16/2023 0446   APPEARANCEUR HAZY (A) 11/16/2023 0446   LABSPEC 1.013 11/16/2023 0446   PHURINE 5.0 11/16/2023 0446   GLUCOSEU 50 (A) 11/16/2023 0446   HGBUR NEGATIVE 11/16/2023 0446   BILIRUBINUR NEGATIVE 11/16/2023 0446   KETONESUR NEGATIVE 11/16/2023 0446   PROTEINUR NEGATIVE 11/16/2023 0446   NITRITE NEGATIVE 11/16/2023 0446   LEUKOCYTESUR NEGATIVE 11/16/2023 0446   Sepsis Labs: Invalid input(s): PROCALCITONIN, LACTICIDVEN  Microbiology: Recent Results (from the past 240 hours)  Surgical PCR screen     Status: None   Collection Time: 11/16/23  2:54 AM   Specimen: Nasal Mucosa; Nasal Swab  Result Value Ref Range Status   MRSA, PCR NEGATIVE NEGATIVE Final   Staphylococcus aureus NEGATIVE NEGATIVE Final    Comment: (NOTE) The Xpert SA Assay (FDA approved for NASAL specimens in patients 46 years of age and older), is one component of a comprehensive surveillance program. It is not intended to diagnose infection nor to guide or monitor treatment. Performed at [facility], [address], [place], [place] [address]     Radiology Studies: No results found.     Bentzion Dauria T. Makayia Duplessis [facility]  If 7PM-7AM, please contact night-coverage [email or web address] 11/19/2023, 2:12 PM

## 2023-11-19 NOTE — Progress Notes (Signed)
 Mobility Specialist Progress Note:    11/19/23 1100  Mobility  Activity Transferred from bed to chair  Level of Assistance Moderate assist, patient does 50-74% (mod-max cues)  Assistive Device Front wheel walker  Distance Ambulated (ft) 6 ft  LLE Weight Bearing Per Provider Order WBAT  Activity Response Tolerated well  Mobility Referral Yes  Mobility visit 1 Mobility  Mobility Specialist Start Time (ACUTE ONLY) 1026  Mobility Specialist Stop Time (ACUTE ONLY) 1037  Mobility Specialist Time Calculation (min) (ACUTE ONLY) 11 min   Pt received in bed and agreeable. Required only minG-minA of physical assistance throughout. Though needed mod-max directional cues d/t vision deficits and being in a pleasantly confused state this date. Pt left in chair with all needs met. Chair alarm on.  D'Vante Nicholaus Mobility Specialist Please contact via Special Educational Needs Teacher or Rehab office at [phone]

## 2023-11-20 DIAGNOSIS — N179 Acute kidney failure, unspecified: Secondary | ICD-10-CM | POA: Diagnosis not present

## 2023-11-20 DIAGNOSIS — S72002D Fracture of unspecified part of neck of left femur, subsequent encounter for closed fracture with routine healing: Secondary | ICD-10-CM | POA: Diagnosis not present

## 2023-11-20 DIAGNOSIS — R930 Abnormal findings on diagnostic imaging of skull and head, not elsewhere classified: Secondary | ICD-10-CM | POA: Diagnosis not present

## 2023-11-20 DIAGNOSIS — E440 Moderate protein-calorie malnutrition: Secondary | ICD-10-CM

## 2023-11-20 LAB — CBC
HCT: 29.5 % — ABNORMAL LOW (ref 39.0–52.0)
Hemoglobin: 10.1 g/dL — ABNORMAL LOW (ref 13.0–17.0)
MCH: 30.1 pg (ref 26.0–34.0)
MCHC: 34.2 g/dL (ref 30.0–36.0)
MCV: 88.1 fL (ref 80.0–100.0)
Platelets: 164 10*3/uL (ref 150–400)
RBC: 3.35 MIL/uL — ABNORMAL LOW (ref 4.22–5.81)
RDW: 13.2 % (ref 11.5–15.5)
WBC: 8.9 10*3/uL (ref 4.0–10.5)
nRBC: 0 % (ref 0.0–0.2)

## 2023-11-20 LAB — BPAM RBC
Blood Product Expiration Date: 202502152359
ISSUE DATE / TIME: 202502051256
Unit Type and Rh: 5100

## 2023-11-20 LAB — TYPE AND SCREEN
ABO/RH(D): O POS
Antibody Screen: NEGATIVE
Unit division: 0

## 2023-11-20 LAB — RENAL FUNCTION PANEL
Albumin: 2.8 g/dL — ABNORMAL LOW (ref 3.5–5.0)
Anion gap: 11 (ref 5–15)
BUN: 26 mg/dL — ABNORMAL HIGH (ref 8–23)
CO2: 26 mmol/L (ref 22–32)
Calcium: 9.1 mg/dL (ref 8.9–10.3)
Chloride: 103 mmol/L (ref 98–111)
Creatinine, Ser: 1.49 mg/dL — ABNORMAL HIGH (ref 0.61–1.24)
GFR, Estimated: 49 mL/min — ABNORMAL LOW (ref >60)
Glucose, Bld: 92 mg/dL (ref 70–99)
Phosphorus: 2.4 mg/dL — ABNORMAL LOW (ref 2.5–4.6)
Potassium: 4.3 mmol/L (ref 3.5–5.1)
Sodium: 140 mmol/L (ref 135–145)

## 2023-11-20 LAB — GLUCOSE, CAPILLARY
Glucose-Capillary: 95 mg/dL (ref 70–99)
Glucose-Capillary: 222 mg/dL — ABNORMAL HIGH (ref 70–99)

## 2023-11-20 LAB — MAGNESIUM: Magnesium: 1.8 mg/dL (ref 1.7–2.4)

## 2023-11-20 MED ORDER — GLUCERNA CARBSTEADY PO LIQD: 237.0000 mL | Freq: Two times a day (BID) | ORAL | Status: DC

## 2023-11-20 NOTE — Progress Notes (Signed)
 Pt discharged to [facility]. Discharge instructions given to wife at bedside. No concerns verbalized. Pt left unit in wheelchair pushed by hospital volunteer accompanied wife. Left in stable condition. Attempted calling report to [facility]. Call twice after which calls were transferred; but no response from other line.

## 2023-11-20 NOTE — TOC Progression Note (Addendum)
 Transition of Care [facility]) - Progression Note    Patient Details  Name: William Todd MRN: [id number] Date of Birth: 10/26/1950  [facility]) CM/SW Contact  [name], LCSW Phone Number: 11/20/2023, 10:28 AM  Clinical Narrative:  PASSR received: [id number].   CSW confirmed with Ellen/Ashton that they can receive pt.  Leeroy needs I week of $10 copays up front.  Wife informed and will call.   1100: Ellen/[facility] has received payment and all set.  CSW spoke with wife [name] about providing transportation to [place] and she is agreeable to this.   Expected Discharge Plan: Skilled Nursing Facility Barriers to Discharge: Continued Medical Work up, SNF Pending bed offer  Expected Discharge Plan and Services In-house Referral: Clinical Social Work   Post Acute Care Choice: Skilled Nursing Facility Living arrangements for the past 2 months: Single Family Home Expected Discharge Date: 11/20/23                                     Social Determinants of Health (SDOH) Interventions SDOH Screenings   Food Insecurity: No Food Insecurity (11/16/2023)  Housing: Low Risk  (11/16/2023)  Transportation Needs: No Transportation Needs (11/16/2023)  Utilities: Not At Risk (11/16/2023)  Social Connections: Moderately Integrated (11/16/2023)  Tobacco Use: Low Risk  (11/16/2023)    Readmission Risk Interventions     No data to display

## 2023-11-20 NOTE — Progress Notes (Signed)
 Mobility Specialist Progress Note:    11/20/23 1000  Mobility  Activity Transferred from bed to chair  Level of Assistance Moderate assist, patient does 50-74%  Assistive Device Front wheel walker  Distance Ambulated (ft) 6 ft  LLE Weight Bearing Per Provider Order WBAT  Activity Response Tolerated well  Mobility Referral Yes  Mobility visit 1 Mobility  Mobility Specialist Start Time (ACUTE ONLY) [id number]  Mobility Specialist Stop Time (ACUTE ONLY) 1002  Mobility Specialist Time Calculation (min) (ACUTE ONLY) 10 min   Pt received in bed and agreeable. Required minA for physical assistance and mod directional cues. No complaints throughout. Pt left in chair with call bell and all needs met. Chair alarm on and family present.  D'Vante Nicholaus Mobility Specialist Please contact via Special Educational Needs Teacher or Rehab office at [phone]

## 2023-11-20 NOTE — Discharge Summary (Signed)
 Physician Discharge Summary  William Todd FMW:[id number] DOB: 25-Oct-1950 DOA: 11/15/2023  PCP: Joshua Francisco, MD  Admit date: 11/15/2023 Discharge date: 11/20/23  Admitted From: Home Disposition: SNF Recommendations for Outpatient Follow-up:  Follow-up as below Check blood pressure, glycemic control, CMP and CBC in 1 week Consider referral to nephrology given CKD and anemia. Please follow up on the following pending results: None   Discharge Condition: Stable CODE STATUS: Full code  Contact information for follow-up providers     Beverley Evalene BIRCH, MD. Schedule an appointment as soon as possible for a visit in 2 week(s).   Specialty: Orthopedic Surgery Contact information: [address] [place] [address] [phone]              Contact information for after-discharge care     Destination     [facility]erred [facility] .   Service: Skilled Nursing Contact information: [address] [place]   [phone]                     Hospital course 73 year old M with PMH of DM-2, prostate cancer, HTN, HLD, left foot fracture with prosthesis and cognitive impairment brought to ED due to inability to ambulate after he sustained a fall about 2 weeks prior.  Patient has been refusing to be evaluated but wife had enough and decided to bring him to the hospital. He has significant memory impairment regarding short-term events.  X-ray showed an acute transverse fracture of the left femoral neck with varus angulation of the fracture fragments.  Orthopedics consulted.  He underwent hemiarthroplasty on 2/2.  He also had AKI that has resolved.    Postop course complicated by delirium/confusion and some acute blood loss anemia.  He was transfused 1 unit with appropriate response.  Therapy recommended SNF.  Outpatient follow-up with orthopedic surgery as above.  See individual problem list  below for more.   Problems addressed during this hospitalization Accidental fall at home-fell about 2 weeks ago.   Has left foot fracture and wears prosthesis. Traumatic left femoral neck fracture due to accidental fall at home.  X-ray confirmed fracture.  Vitamin D  level 52. -S/p left hemiarthroplasty on 2/2 -Aspirin  81 mg twice daily for 28 days for VTE prophylaxis -Tylenol  and tramadol  for pain control -Continue therapy at SNF -Orthopedic surgery follow-up as above     Essential hypertension: Normotensive for most part. -Continue home meds. -Discontinued lisinopril  to allow renal recovery   AKI: Unknown baseline but he could have underlying CKD 3B.  Renal function improved. Recent Labs    11/15/23 1720 11/16/23 0600 11/17/23 0743 11/18/23 0603 11/19/23 0554 11/20/23 0802  BUN 45* 40* 18 47* 34* 26*  CREATININE 2.49* 1.94* 1.20 2.15* 1.68* 1.49*  -Avoid toxic meds.  Discontinue lisinopril . -Recheck renal function in 1 week -Recommend ambulatory referral to nephrology given associated pneumonia    NIDDM-2 with hyperglycemia: A1c 6.4%. -Continue home glimepiride  and Januvia. -Carb modified diet   Normocytic anemia: Hgb dropped about 2 g postoperatively.  No report of EBL.  Unknown baseline.  Anemia panel consistent with anemia of chronic disease.  Recent Labs    11/15/23 1720 11/16/23 0600 11/17/23 0904 11/18/23 0603 11/19/23 0554 11/20/23 0802  HGB 10.1* 9.0* 7.7* 8.0* 7.4* 10.1*  -Recheck CBC in 1 to 2 weeks -Consider referral to nephrology    Acute metabolic encephalopathy/delirium/dementia with behavioral disturbance: Seems to have resolved.  MRI brain without acute finding.  Currently AAO x 4 but confused about situation and a little anxious. -Reorientation and delirium precaution -Minimize or avoid sedating medications   Mood disorder: Stable.  No longer takes Lexapro    Moderate malnutrition Nutrition Problem: Moderate Malnutrition Etiology: chronic  illness Signs/Symptoms: severe muscle depletion, moderate muscle depletion, severe fat depletion, moderate fat depletion Interventions: Glucerna given diabetes.     Time spent 35 minutes  Vital signs Vitals:   11/19/23 1337 11/19/23 1937 11/20/23 0516 11/20/23 0734  BP: 106/66 136/74 126/70 (!) 141/68  Pulse: (!) 106 99 88 95  Temp: 98.1 F (36.7 C) 98.1 F (36.7 C) 97.9 F (36.6 C) 99.7 F (37.6 C)  Resp: 16 16 16 17   Height:      Weight:      SpO2: 100% 99% 100% 91%  TempSrc:      BMI (Calculated):         Discharge exam  GENERAL: No apparent distress.  Nontoxic. HEENT: MMM.  Vision and hearing grossly intact.  NECK: Supple.  No apparent JVD.  RESP:  No IWOB.  Fair aeration bilaterally. CVS:  RRR. Heart sounds normal.  ABD/GI/GU: BS+. Abd soft, NTND.  MSK/EXT:  Moves extremities.  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  Dressing over left hip DCI.  SKIN: no apparent skin lesion or wound NEURO: Awake and alert. Oriented appropriately.  No apparent focal neuro deficit. PSYCH: Calm. Normal affect.   Discharge Instructions Discharge Instructions     Diet - low sodium heart healthy   Complete by: As directed    Diet Carb Modified   Complete by: As directed    Increase activity slowly   Complete by: As directed       Allergies as of 11/20/2023   No Known Allergies      Medication List     STOP taking these medications    lisinopril  20 MG tablet Commonly known as: ZESTRIL        TAKE these medications    acetaminophen  325 MG tablet Commonly known as: TYLENOL  Take 3 tablets (975 mg total) by mouth every 8 (eight) hours for 5 days, THEN 3 tablets (975 mg total) every 8 (eight) hours as needed for up to 25 days. Start taking on: November 19, 2023   amLODipine  5 MG tablet Commonly known as: NORVASC  Take 1 tablet (5 mg total) by mouth daily. What changed: medication strength   aspirin  EC 81 MG tablet Take 1 tablet (81 mg total) by mouth in  the morning and at bedtime for 28 days, THEN 1 tablet (81 mg total) daily. Start taking on: November 19, 2023 What changed: See the new instructions.   atorvastatin  80 MG tablet Commonly known as: LIPITOR  Take 0.5 tablets (40 mg total) by mouth daily at 6 PM.   famotidine  20 MG tablet Commonly known as: Pepcid  Take 1 tablet (20 mg total) by mouth 2 (two) times daily.   glimepiride  4 MG tablet Commonly known as: AMARYL    Glucerna Carbsteady Liqd Take 237 mLs by mouth 2 (two) times daily after a meal.   metoprolol  succinate 50 MG 24 hr tablet Commonly known as: TOPROL -XL Take 1 tablet (50 mg total) by mouth daily. Take with or immediately following a meal.   nitroGLYCERIN  0.4 MG SL tablet Commonly known as: NITROSTAT  Place 1 tablet (0.4 mg total) under the tongue every 5 (five) minutes x 3 doses as needed for chest pain.   polyethylene glycol powder 17 GM/SCOOP powder Commonly known as: MiraLax  Take  17 g by mouth 2 (two) times daily as needed for moderate constipation.   senna-docusate 8.6-50 MG tablet Commonly known as: Senokot-S Take 1 tablet by mouth 2 (two) times daily between meals as needed for mild constipation.   sitaGLIPtin 50 MG tablet Commonly known as: JANUVIA Take 50 mg by mouth daily.   traMADol  50 MG tablet Commonly known as: ULTRAM  Take 1 tablet (50 mg total) by mouth every 8 (eight) hours as needed for up to 7 days for moderate pain (pain score 4-6).        Consultations: Orthopedic surgery  Procedures/Studies: 2/2-left hip hemiarthroplasty by Dr. Beverley BARE HIP UNILAT W OR W/O PELVIS 2-3 VIEWS LEFT Result Date: 11/16/2023 CLINICAL DATA:  [id number] History of left hip hemiarthroplasty [id number] EXAM: DG HIP (WITH OR WITHOUT PELVIS) 2-3V LEFT COMPARISON:  11/15/2023 FINDINGS: Interval left hip arthroplasty, components projecting in expected location. No fracture or dislocation. Surgical clips project over the pubic bones. Patchy iliofemoral arterial  calcifications. IMPRESSION: Left hip arthroplasty, without apparent complication. Electronically Signed   By: JONETTA Faes M.D.   On: 11/16/2023 16:11   MR BRAIN WO CONTRAST Result Date: 11/15/2023 CLINICAL DATA:  Abnl CT Head, evaluate for pontine infarct EXAM: MRI HEAD WITHOUT CONTRAST TECHNIQUE: Multiplanar, multiecho pulse sequences of the brain and surrounding structures were obtained without intravenous contrast. COMPARISON:  CT head from today. FINDINGS: Brain: No acute infarction, hemorrhage, hydrocephalus, extra-axial collection or mass lesion. Remote right PCA territory infarct. Moderate T2/FLAIR hyperintensities in the white matter, compatible with chronic microvascular ischemic disease. Vascular: Major arterial flow voids are maintained at the skull base. Skull and upper cervical spine: Normal marrow signal. Sinuses/Orbits: Negative. Other: No sizable mastoid effusions. IMPRESSION: 1. No evidence of acute intracranial abnormality. 2. Remote right PCA territory infarct. Electronically Signed   By: Gilmore GORMAN Molt M.D.   On: 11/15/2023 22:38   CT Head Wo Contrast Result Date: 11/15/2023 CLINICAL DATA:  Altered mental status, fall EXAM: CT HEAD WITHOUT CONTRAST TECHNIQUE: Contiguous axial images were obtained from the base of the skull through the vertex without intravenous contrast. RADIATION DOSE REDUCTION: This exam was performed according to the departmental dose-optimization program which includes automated exposure control, adjustment of the mA and/or kV according to patient size and/or use of iterative reconstruction technique. COMPARISON:  None Available. FINDINGS: Brain: No acute territorial infarction, hemorrhage or intracranial mass. 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 Atrophy and advanced chronic small vessel ischemic changes of the white matter. Hypodensity at the pons, series 2, image 11 and sagittal series 6 image 26. Ventricles are nonenlarged  Vascular: No hyperdense vessels. Carotid and vertebral calcifications. Skull: Normal. Negative for fracture or focal lesion. Sinuses/Orbits: No acute finding. Other: None IMPRESSION: 1. Negative for acute intracranial hemorrhage or mass. 2. Hypodensity at the pons, though some of this may be related to artifact, the finding is rounded with some sparing of peripheral tissue and acute or subacute pontine infarct cannot be excluded. MRI is recommended for further evaluation. 3. Atrophy and chronic small vessel ischemic changes of the white matter Electronically Signed   By: Luke Bun M.D.   On: 11/15/2023 18:51   DG Ankle Complete Left Result Date: 11/15/2023 CLINICAL DATA:  Fall with ankle pain EXAM: LEFT ANKLE COMPLETE - 3+ VIEW COMPARISON:  01/30/2012 FINDINGS: Lateral plate and multiple fixating screws with additional medial malleolar fixating screws. Chronic postsurgical, posttraumatic and post infectious changes involving the distal tibia, distal fibula and tibiotalar  joint. Fused appearance across the tibiotalar joint and probable subtalar fusion as well. No definite acute fracture is seen. IMPRESSION: Chronic postsurgical, posttraumatic and post infectious changes involving the distal tibia, distal fibula and tibiotalar joint. No definite acute osseous abnormality. Electronically Signed   By: Luke Bun M.D.   On: 11/15/2023 18:09   DG Foot Complete Left Result Date: 11/15/2023 CLINICAL DATA:  Clemens 2 weeks ago with left foot pain. EXAM: LEFT FOOT - COMPLETE 3+ VIEW COMPARISON:  Left ankle 01/30/2012 FINDINGS: Diffuse bone demineralization. Degenerative changes in the interphalangeal joints, metatarsal-phalangeal, tarsometatarsal, and interphalangeal joints. Postoperative fusion of the ankle joint with plate and screw fixation. No evidence of acute fracture or dislocation. Soft tissues are unremarkable. IMPRESSION: 1. No acute bony abnormalities. 2. Degenerative changes in the left foot. 3.  Postoperative changes with surgical fusion of the left ankle including plate and screw fixation Electronically Signed   By: Elsie Gravely M.D.   On: 11/15/2023 18:03   DG Hip Unilat W or Wo Pelvis 2-3 Views Left Result Date: 11/15/2023 CLINICAL DATA:  Fall 2 weeks ago. Dizziness with fall forward. Left foot pain. EXAM: DG HIP (WITH OR WITHOUT PELVIS) 2-3V LEFT COMPARISON:  None Available. FINDINGS: Transverse subcapital fracture of the left femoral neck with varus angulation of the fracture fragments. No inter trochanteric involvement is identified. No dislocation at the hip joint. Pelvis appears intact. SI joints and symphysis pubis are not displaced. No destructive or expansile bone lesions. Degenerative changes in the lower lumbar spine and both hips. Vascular calcifications. IMPRESSION: Acute transverse fracture of the left femoral neck with varus angulation of the fracture fragments. Degenerative changes. Electronically Signed   By: Elsie Gravely M.D.   On: 11/15/2023 18:02       The results of significant diagnostics from this hospitalization (including imaging, microbiology, ancillary and laboratory) are listed below for reference.     Microbiology: Recent Results (from the past 240 hours)  Surgical PCR screen     Status: None   Collection Time: 11/16/23  2:54 AM   Specimen: Nasal Mucosa; Nasal Swab  Result Value Ref Range Status   MRSA, PCR NEGATIVE NEGATIVE Final   Staphylococcus aureus NEGATIVE NEGATIVE Final    Comment: (NOTE) The Xpert SA Assay (FDA approved for NASAL specimens in patients 58 years of age and older), is one component of a comprehensive surveillance program. It is not intended to diagnose infection nor to guide or monitor treatment. Performed at [facility], [address], [place], [place] [address]      Labs:  CBC: Recent Labs  Lab 11/15/23 1720 11/16/23 0600 11/17/23 0904 11/18/23 0603 11/19/23 0554 11/20/23 0802  WBC 12.6* 9.6 11.1*  11.4* 8.9 8.9  NEUTROABS 11.1*  --   --   --   --   --   HGB 10.1* 9.0* 7.7* 8.0* 7.4* 10.1*  HCT 30.7* 26.2* 22.6* 23.5* 22.1* 29.5*  MCV 92.5 90.7 89.7 90.4 90.6 88.1  PLT 245 208 196 170 157 164   BMP &GFR Recent Labs  Lab 11/16/23 0600 11/17/23 0743 11/18/23 0603 11/19/23 0554 11/20/23 0802  NA 140 137 140 137 140  K 4.1 4.1 4.1 4.3 4.3  CL 105 108 102 103 103  CO2 25 21* 25 25 26   GLUCOSE 121* 103* 101* 150* 92  BUN 40* 18 47* 34* 26*  CREATININE 1.94* 1.20 2.15* 1.68* 1.49*  CALCIUM  9.1 7.8* 9.2 9.0 9.1  MG 2.2 2.0 2.0 1.9 1.8  PHOS 2.9 2.8 2.7 2.3* 2.4*   Estimated Creatinine Clearance: 41.7 mL/min (A) (by C-G formula based on SCr of 1.49 mg/dL (H)). Liver & Pancreas: Recent Labs  Lab 11/15/23 1720 11/17/23 0743 11/18/23 0603 11/19/23 0554 11/20/23 0802  AST 45*  --   --   --   --   ALT 44  --   --   --   --   ALKPHOS 83  --   --   --   --   BILITOT 1.0  --   --   --   --   PROT 7.0  --   --   --   --   ALBUMIN  2.9* 2.2* 2.8* 2.6* 2.8*   No results for input(s): LIPASE, AMYLASE in the last 168 hours. No results for input(s): AMMONIA in the last 168 hours. Diabetic: No results for input(s): HGBA1C in the last 72 hours. Recent Labs  Lab 11/19/23 0618 11/19/23 1116 11/19/23 1756 11/19/23 2135 11/20/23 0738  GLUCAP 139* 209* 192* 123* 95   Cardiac Enzymes: Recent Labs  Lab 11/15/23 1720  CKTOTAL 193   No results for input(s): PROBNP in the last 8760 hours. Coagulation Profile: Recent Labs  Lab 11/16/23 0600  INR 1.3*   Thyroid Function Tests: No results for input(s): TSH, T4TOTAL, FREET4, T3FREE, THYROIDAB in the last 72 hours. Lipid Profile: No results for input(s): CHOL, HDL, LDLCALC, TRIG, CHOLHDL, LDLDIRECT in the last 72 hours. Anemia Panel: Recent Labs    11/17/23 1202  VITAMINB12 731  FOLATE 8.9  FERRITIN 329  TIBC 164*  IRON 36*  RETICCTPCT 2.4   Urine analysis:    Component Value Date/Time    COLORURINE YELLOW 11/16/2023 0446   APPEARANCEUR HAZY (A) 11/16/2023 0446   LABSPEC 1.013 11/16/2023 0446   PHURINE 5.0 11/16/2023 0446   GLUCOSEU 50 (A) 11/16/2023 0446   HGBUR NEGATIVE 11/16/2023 0446   BILIRUBINUR NEGATIVE 11/16/2023 0446   KETONESUR NEGATIVE 11/16/2023 0446   PROTEINUR NEGATIVE 11/16/2023 0446   NITRITE NEGATIVE 11/16/2023 0446   LEUKOCYTESUR NEGATIVE 11/16/2023 0446   Sepsis Labs: Invalid input(s): PROCALCITONIN, LACTICIDVEN   SIGNED:  Chelbie Jarnagin T Mady Oubre, MD  [facility] 11/20/2023, 10:39 AM

## 2023-11-20 NOTE — TOC Transition Note (Addendum)
 Transition of Care [facility]) - Discharge Note   Patient Details  Name: William Todd MRN: [id number] Date of Birth: 01-06-1951  [facility] CM/SW Contact:  [name], LCSW Phone Number: 11/20/2023, 11:23 AM   Clinical Narrative:   Pt discharging to [facility], room 904B.  RN call report to [phone].  Pt wife will provide transport.  Pt will need to be brought down to main north tower entrance with assistance getting into the vehicle.    Final next level of care: Skilled Nursing Facility Barriers to Discharge: Barriers Resolved   Patient Goals and CMS Choice   CMS Medicare.gov Compare Post Acute Care list provided to:: Patient Represenative (must comment) (wife [name]) Choice offered to / list presented to : Spouse      Discharge Placement              Patient chooses bed at: [facility] Patient to be transferred to facility by: wife [name] Name of family member notified: wife [name] in room Patient and family notified of of transfer: 11/20/23  Discharge Plan and Services Additional resources added to the After Visit Summary for   In-house Referral: Clinical Social Work   Post Acute Care Choice: Skilled Nursing Facility                               Social Drivers of Health (SDOH) Interventions SDOH Screenings   Food Insecurity: No Food Insecurity (11/16/2023)  Housing: Low Risk  (11/16/2023)  Transportation Needs: No Transportation Needs (11/16/2023)  Utilities: Not At Risk (11/16/2023)  Social Connections: Moderately Integrated (11/16/2023)  Tobacco Use: Low Risk  (11/16/2023)     Readmission Risk Interventions     No data to display

## 2023-11-29 ENCOUNTER — Other Ambulatory Visit: Payer: Self-pay

## 2023-11-29 ENCOUNTER — Inpatient Hospital Stay (HOSPITAL_COMMUNITY)
Admission: EM | Admit: 2023-11-29 | Discharge: 2024-01-01 | DRG: 082 | Disposition: A | Discharge status: Skilled Nursing Facility | Payer: Medicare HMO | Source: Skilled Nursing Facility | Attending: Internal Medicine | Admitting: Internal Medicine

## 2023-11-29 ENCOUNTER — Emergency Department (HOSPITAL_COMMUNITY): Payer: Medicare HMO

## 2023-11-29 DIAGNOSIS — L89222 Pressure ulcer of left hip, stage 2: Secondary | ICD-10-CM | POA: Diagnosis present

## 2023-11-29 DIAGNOSIS — Z9889 Other specified postprocedural states: Secondary | ICD-10-CM

## 2023-11-29 DIAGNOSIS — Z96642 Presence of left artificial hip joint: Secondary | ICD-10-CM | POA: Diagnosis present

## 2023-11-29 DIAGNOSIS — R413 Other amnesia: Secondary | ICD-10-CM | POA: Diagnosis present

## 2023-11-29 DIAGNOSIS — L89893 Pressure ulcer of other site, stage 3: Secondary | ICD-10-CM | POA: Diagnosis present

## 2023-11-29 DIAGNOSIS — W19XXXA Unspecified fall, initial encounter: Secondary | ICD-10-CM

## 2023-11-29 DIAGNOSIS — S0633AA Contusion and laceration of cerebrum, unspecified, with loss of consciousness status unknown, initial encounter: Secondary | ICD-10-CM | POA: Diagnosis present

## 2023-11-29 DIAGNOSIS — J690 Pneumonitis due to inhalation of food and vomit: Secondary | ICD-10-CM | POA: Diagnosis not present

## 2023-11-29 DIAGNOSIS — E1165 Type 2 diabetes mellitus with hyperglycemia: Secondary | ICD-10-CM | POA: Diagnosis present

## 2023-11-29 DIAGNOSIS — E1122 Type 2 diabetes mellitus with diabetic chronic kidney disease: Secondary | ICD-10-CM | POA: Diagnosis present

## 2023-11-29 DIAGNOSIS — R296 Repeated falls: Secondary | ICD-10-CM | POA: Diagnosis present

## 2023-11-29 DIAGNOSIS — R627 Adult failure to thrive: Secondary | ICD-10-CM | POA: Diagnosis present

## 2023-11-29 DIAGNOSIS — F0390 Unspecified dementia without behavioral disturbance: Secondary | ICD-10-CM | POA: Diagnosis present

## 2023-11-29 DIAGNOSIS — S01419A Laceration without foreign body of unspecified cheek and temporomandibular area, initial encounter: Secondary | ICD-10-CM | POA: Diagnosis present

## 2023-11-29 DIAGNOSIS — S72002S Fracture of unspecified part of neck of left femur, sequela: Secondary | ICD-10-CM

## 2023-11-29 DIAGNOSIS — I609 Nontraumatic subarachnoid hemorrhage, unspecified: Secondary | ICD-10-CM

## 2023-11-29 DIAGNOSIS — Z79899 Other long term (current) drug therapy: Secondary | ICD-10-CM

## 2023-11-29 DIAGNOSIS — R7401 Elevation of levels of liver transaminase levels: Secondary | ICD-10-CM | POA: Diagnosis not present

## 2023-11-29 DIAGNOSIS — E870 Hyperosmolality and hypernatremia: Secondary | ICD-10-CM | POA: Diagnosis present

## 2023-11-29 DIAGNOSIS — F39 Unspecified mood [affective] disorder: Secondary | ICD-10-CM | POA: Diagnosis present

## 2023-11-29 DIAGNOSIS — Y92129 Unspecified place in nursing home as the place of occurrence of the external cause: Secondary | ICD-10-CM

## 2023-11-29 DIAGNOSIS — E785 Hyperlipidemia, unspecified: Secondary | ICD-10-CM | POA: Insufficient documentation

## 2023-11-29 DIAGNOSIS — I2510 Atherosclerotic heart disease of native coronary artery without angina pectoris: Secondary | ICD-10-CM | POA: Diagnosis present

## 2023-11-29 DIAGNOSIS — R29810 Facial weakness: Secondary | ICD-10-CM | POA: Diagnosis present

## 2023-11-29 DIAGNOSIS — W1830XA Fall on same level, unspecified, initial encounter: Secondary | ICD-10-CM | POA: Diagnosis present

## 2023-11-29 DIAGNOSIS — F419 Anxiety disorder, unspecified: Secondary | ICD-10-CM | POA: Diagnosis present

## 2023-11-29 DIAGNOSIS — L89620 Pressure ulcer of left heel, unstageable: Secondary | ICD-10-CM | POA: Diagnosis present

## 2023-11-29 DIAGNOSIS — K5900 Constipation, unspecified: Secondary | ICD-10-CM | POA: Diagnosis present

## 2023-11-29 DIAGNOSIS — I619 Nontraumatic intracerebral hemorrhage, unspecified: Principal | ICD-10-CM

## 2023-11-29 DIAGNOSIS — S01112A Laceration without foreign body of left eyelid and periocular area, initial encounter: Secondary | ICD-10-CM | POA: Diagnosis present

## 2023-11-29 DIAGNOSIS — N179 Acute kidney failure, unspecified: Secondary | ICD-10-CM | POA: Diagnosis present

## 2023-11-29 DIAGNOSIS — Z1152 Encounter for screening for COVID-19: Secondary | ICD-10-CM

## 2023-11-29 DIAGNOSIS — K219 Gastro-esophageal reflux disease without esophagitis: Secondary | ICD-10-CM | POA: Diagnosis present

## 2023-11-29 DIAGNOSIS — I10 Essential (primary) hypertension: Secondary | ICD-10-CM | POA: Insufficient documentation

## 2023-11-29 DIAGNOSIS — G9341 Metabolic encephalopathy: Secondary | ICD-10-CM

## 2023-11-29 DIAGNOSIS — N189 Chronic kidney disease, unspecified: Secondary | ICD-10-CM | POA: Diagnosis present

## 2023-11-29 DIAGNOSIS — E119 Type 2 diabetes mellitus without complications: Secondary | ICD-10-CM

## 2023-11-29 DIAGNOSIS — Z681 Body mass index (BMI) 19 or less, adult: Secondary | ICD-10-CM

## 2023-11-29 DIAGNOSIS — Z8042 Family history of malignant neoplasm of prostate: Secondary | ICD-10-CM

## 2023-11-29 DIAGNOSIS — Z66 Do not resuscitate: Secondary | ICD-10-CM | POA: Diagnosis not present

## 2023-11-29 DIAGNOSIS — Z8659 Personal history of other mental and behavioral disorders: Secondary | ICD-10-CM

## 2023-11-29 DIAGNOSIS — S066XAA Traumatic subarachnoid hemorrhage with loss of consciousness status unknown, initial encounter: Principal | ICD-10-CM | POA: Diagnosis present

## 2023-11-29 DIAGNOSIS — I129 Hypertensive chronic kidney disease with stage 1 through stage 4 chronic kidney disease, or unspecified chronic kidney disease: Secondary | ICD-10-CM | POA: Diagnosis present

## 2023-11-29 DIAGNOSIS — E8809 Other disorders of plasma-protein metabolism, not elsewhere classified: Secondary | ICD-10-CM | POA: Diagnosis present

## 2023-11-29 DIAGNOSIS — J189 Pneumonia, unspecified organism: Secondary | ICD-10-CM | POA: Diagnosis not present

## 2023-11-29 DIAGNOSIS — A419 Sepsis, unspecified organism: Secondary | ICD-10-CM | POA: Diagnosis not present

## 2023-11-29 DIAGNOSIS — E11649 Type 2 diabetes mellitus with hypoglycemia without coma: Secondary | ICD-10-CM | POA: Diagnosis not present

## 2023-11-29 DIAGNOSIS — Z923 Personal history of irradiation: Secondary | ICD-10-CM

## 2023-11-29 DIAGNOSIS — R197 Diarrhea, unspecified: Secondary | ICD-10-CM | POA: Diagnosis present

## 2023-11-29 DIAGNOSIS — Z794 Long term (current) use of insulin: Secondary | ICD-10-CM

## 2023-11-29 DIAGNOSIS — R1310 Dysphagia, unspecified: Secondary | ICD-10-CM | POA: Diagnosis present

## 2023-11-29 DIAGNOSIS — Z7982 Long term (current) use of aspirin: Secondary | ICD-10-CM

## 2023-11-29 DIAGNOSIS — G8194 Hemiplegia, unspecified affecting left nondominant side: Secondary | ICD-10-CM | POA: Diagnosis present

## 2023-11-29 DIAGNOSIS — N1832 Chronic kidney disease, stage 3b: Secondary | ICD-10-CM | POA: Diagnosis present

## 2023-11-29 DIAGNOSIS — S7292XA Unspecified fracture of left femur, initial encounter for closed fracture: Secondary | ICD-10-CM | POA: Diagnosis present

## 2023-11-29 DIAGNOSIS — I611 Nontraumatic intracerebral hemorrhage in hemisphere, cortical: Secondary | ICD-10-CM

## 2023-11-29 DIAGNOSIS — D649 Anemia, unspecified: Secondary | ICD-10-CM | POA: Insufficient documentation

## 2023-11-29 DIAGNOSIS — R4189 Other symptoms and signs involving cognitive functions and awareness: Secondary | ICD-10-CM | POA: Diagnosis present

## 2023-11-29 DIAGNOSIS — E43 Unspecified severe protein-calorie malnutrition: Secondary | ICD-10-CM | POA: Insufficient documentation

## 2023-11-29 DIAGNOSIS — R4701 Aphasia: Secondary | ICD-10-CM | POA: Diagnosis present

## 2023-11-29 DIAGNOSIS — S061XAA Traumatic cerebral edema with loss of consciousness status unknown, initial encounter: Secondary | ICD-10-CM | POA: Diagnosis present

## 2023-11-29 DIAGNOSIS — Z7984 Long term (current) use of oral hypoglycemic drugs: Secondary | ICD-10-CM

## 2023-11-29 DIAGNOSIS — L89152 Pressure ulcer of sacral region, stage 2: Secondary | ICD-10-CM | POA: Diagnosis present

## 2023-11-29 DIAGNOSIS — Z8546 Personal history of malignant neoplasm of prostate: Secondary | ICD-10-CM

## 2023-11-29 NOTE — ED Triage Notes (Signed)
Pt BIB [facility] from [facility]. Per staff pt was walking around the unit and fell, landing on left side, denies LOC. Not on blood thinners. GCS 13; per facility this is baseline

## 2023-11-29 NOTE — ED Notes (Signed)
1 in laceration noted to pts L eyebrow; bleeding controlled

## 2023-11-30 ENCOUNTER — Inpatient Hospital Stay (HOSPITAL_COMMUNITY): Payer: Medicare HMO

## 2023-11-30 ENCOUNTER — Emergency Department (HOSPITAL_COMMUNITY): Payer: Medicare HMO

## 2023-11-30 ENCOUNTER — Encounter (HOSPITAL_COMMUNITY): Payer: Self-pay | Admitting: Internal Medicine

## 2023-11-30 DIAGNOSIS — G936 Cerebral edema: Secondary | ICD-10-CM | POA: Diagnosis not present

## 2023-11-30 DIAGNOSIS — E119 Type 2 diabetes mellitus without complications: Secondary | ICD-10-CM | POA: Diagnosis not present

## 2023-11-30 DIAGNOSIS — G9341 Metabolic encephalopathy: Secondary | ICD-10-CM

## 2023-11-30 DIAGNOSIS — L89620 Pressure ulcer of left heel, unstageable: Secondary | ICD-10-CM | POA: Diagnosis present

## 2023-11-30 DIAGNOSIS — I10 Essential (primary) hypertension: Secondary | ICD-10-CM

## 2023-11-30 DIAGNOSIS — Z1152 Encounter for screening for COVID-19: Secondary | ICD-10-CM | POA: Diagnosis not present

## 2023-11-30 DIAGNOSIS — Y92129 Unspecified place in nursing home as the place of occurrence of the external cause: Secondary | ICD-10-CM

## 2023-11-30 DIAGNOSIS — N179 Acute kidney failure, unspecified: Secondary | ICD-10-CM

## 2023-11-30 DIAGNOSIS — Z681 Body mass index (BMI) 19 or less, adult: Secondary | ICD-10-CM | POA: Diagnosis not present

## 2023-11-30 DIAGNOSIS — J690 Pneumonitis due to inhalation of food and vomit: Secondary | ICD-10-CM | POA: Diagnosis not present

## 2023-11-30 DIAGNOSIS — L89222 Pressure ulcer of left hip, stage 2: Secondary | ICD-10-CM | POA: Diagnosis present

## 2023-11-30 DIAGNOSIS — Z8659 Personal history of other mental and behavioral disorders: Secondary | ICD-10-CM

## 2023-11-30 DIAGNOSIS — R410 Disorientation, unspecified: Secondary | ICD-10-CM | POA: Diagnosis not present

## 2023-11-30 DIAGNOSIS — I129 Hypertensive chronic kidney disease with stage 1 through stage 4 chronic kidney disease, or unspecified chronic kidney disease: Secondary | ICD-10-CM | POA: Diagnosis present

## 2023-11-30 DIAGNOSIS — Z7189 Other specified counseling: Secondary | ICD-10-CM | POA: Diagnosis not present

## 2023-11-30 DIAGNOSIS — Z66 Do not resuscitate: Secondary | ICD-10-CM | POA: Diagnosis not present

## 2023-11-30 DIAGNOSIS — I619 Nontraumatic intracerebral hemorrhage, unspecified: Secondary | ICD-10-CM | POA: Diagnosis present

## 2023-11-30 DIAGNOSIS — N189 Chronic kidney disease, unspecified: Secondary | ICD-10-CM

## 2023-11-30 DIAGNOSIS — A419 Sepsis, unspecified organism: Secondary | ICD-10-CM | POA: Diagnosis not present

## 2023-11-30 DIAGNOSIS — S066XAA Traumatic subarachnoid hemorrhage with loss of consciousness status unknown, initial encounter: Principal | ICD-10-CM

## 2023-11-30 DIAGNOSIS — E7849 Other hyperlipidemia: Secondary | ICD-10-CM

## 2023-11-30 DIAGNOSIS — J9601 Acute respiratory failure with hypoxia: Secondary | ICD-10-CM | POA: Diagnosis not present

## 2023-11-30 DIAGNOSIS — R4701 Aphasia: Secondary | ICD-10-CM | POA: Diagnosis present

## 2023-11-30 DIAGNOSIS — E43 Unspecified severe protein-calorie malnutrition: Secondary | ICD-10-CM | POA: Diagnosis present

## 2023-11-30 DIAGNOSIS — F0390 Unspecified dementia without behavioral disturbance: Secondary | ICD-10-CM | POA: Diagnosis present

## 2023-11-30 DIAGNOSIS — E1165 Type 2 diabetes mellitus with hyperglycemia: Secondary | ICD-10-CM | POA: Diagnosis present

## 2023-11-30 DIAGNOSIS — F39 Unspecified mood [affective] disorder: Secondary | ICD-10-CM | POA: Diagnosis present

## 2023-11-30 DIAGNOSIS — L89893 Pressure ulcer of other site, stage 3: Secondary | ICD-10-CM | POA: Diagnosis present

## 2023-11-30 DIAGNOSIS — D649 Anemia, unspecified: Secondary | ICD-10-CM | POA: Diagnosis present

## 2023-11-30 DIAGNOSIS — E785 Hyperlipidemia, unspecified: Secondary | ICD-10-CM | POA: Diagnosis not present

## 2023-11-30 DIAGNOSIS — S06350A Traumatic hemorrhage of left cerebrum without loss of consciousness, initial encounter: Secondary | ICD-10-CM

## 2023-11-30 DIAGNOSIS — W1830XA Fall on same level, unspecified, initial encounter: Secondary | ICD-10-CM

## 2023-11-30 DIAGNOSIS — W19XXXA Unspecified fall, initial encounter: Secondary | ICD-10-CM | POA: Diagnosis not present

## 2023-11-30 DIAGNOSIS — Z8546 Personal history of malignant neoplasm of prostate: Secondary | ICD-10-CM

## 2023-11-30 DIAGNOSIS — J189 Pneumonia, unspecified organism: Secondary | ICD-10-CM | POA: Diagnosis not present

## 2023-11-30 DIAGNOSIS — I609 Nontraumatic subarachnoid hemorrhage, unspecified: Secondary | ICD-10-CM | POA: Diagnosis not present

## 2023-11-30 DIAGNOSIS — R569 Unspecified convulsions: Secondary | ICD-10-CM | POA: Diagnosis not present

## 2023-11-30 DIAGNOSIS — N1832 Chronic kidney disease, stage 3b: Secondary | ICD-10-CM

## 2023-11-30 DIAGNOSIS — G8194 Hemiplegia, unspecified affecting left nondominant side: Secondary | ICD-10-CM | POA: Diagnosis present

## 2023-11-30 DIAGNOSIS — I611 Nontraumatic intracerebral hemorrhage in hemisphere, cortical: Secondary | ICD-10-CM | POA: Diagnosis not present

## 2023-11-30 DIAGNOSIS — E870 Hyperosmolality and hypernatremia: Secondary | ICD-10-CM | POA: Diagnosis present

## 2023-11-30 DIAGNOSIS — L89152 Pressure ulcer of sacral region, stage 2: Secondary | ICD-10-CM | POA: Diagnosis present

## 2023-11-30 DIAGNOSIS — Z515 Encounter for palliative care: Secondary | ICD-10-CM | POA: Diagnosis not present

## 2023-11-30 DIAGNOSIS — R413 Other amnesia: Secondary | ICD-10-CM

## 2023-11-30 DIAGNOSIS — E1122 Type 2 diabetes mellitus with diabetic chronic kidney disease: Secondary | ICD-10-CM | POA: Diagnosis present

## 2023-11-30 LAB — CBC
HCT: 26.7 % — ABNORMAL LOW (ref 39.0–52.0)
Hemoglobin: 8.7 g/dL — ABNORMAL LOW (ref 13.0–17.0)
MCH: 30.6 pg (ref 26.0–34.0)
MCHC: 32.6 g/dL (ref 30.0–36.0)
MCV: 94 fL (ref 80.0–100.0)
Platelets: 203 10*3/uL (ref 150–400)
RBC: 2.84 MIL/uL — ABNORMAL LOW (ref 4.22–5.81)
RDW: 14.5 % (ref 11.5–15.5)
WBC: 9.9 10*3/uL (ref 4.0–10.5)
nRBC: 0 % (ref 0.0–0.2)

## 2023-11-30 LAB — AMMONIA: Ammonia: 14 umol/L (ref 9–35)

## 2023-11-30 LAB — COMPREHENSIVE METABOLIC PANEL
ALT: 45 U/L — ABNORMAL HIGH (ref 0–44)
ALT: 51 U/L — ABNORMAL HIGH (ref 0–44)
AST: 43 U/L — ABNORMAL HIGH (ref 15–41)
AST: 43 U/L — ABNORMAL HIGH (ref 15–41)
Albumin: 2.9 g/dL — ABNORMAL LOW (ref 3.5–5.0)
Albumin: 3.1 g/dL — ABNORMAL LOW (ref 3.5–5.0)
Alkaline Phosphatase: 89 U/L (ref 38–126)
Alkaline Phosphatase: 90 U/L (ref 38–126)
Anion gap: 11 (ref 5–15)
Anion gap: 11 (ref 5–15)
BUN: 29 mg/dL — ABNORMAL HIGH (ref 8–23)
BUN: 33 mg/dL — ABNORMAL HIGH (ref 8–23)
CO2: 25 mmol/L (ref 22–32)
CO2: 25 mmol/L (ref 22–32)
Calcium: 9 mg/dL (ref 8.9–10.3)
Calcium: 9.3 mg/dL (ref 8.9–10.3)
Chloride: 103 mmol/L (ref 98–111)
Chloride: 105 mmol/L (ref 98–111)
Creatinine, Ser: 1.57 mg/dL — ABNORMAL HIGH (ref 0.61–1.24)
Creatinine, Ser: 1.87 mg/dL — ABNORMAL HIGH (ref 0.61–1.24)
GFR, Estimated: 37 mL/min — ABNORMAL LOW (ref >60)
GFR, Estimated: 46 mL/min — ABNORMAL LOW (ref >60)
Glucose, Bld: 154 mg/dL — ABNORMAL HIGH (ref 70–99)
Glucose, Bld: 262 mg/dL — ABNORMAL HIGH (ref 70–99)
Potassium: 4.6 mmol/L (ref 3.5–5.1)
Potassium: 4.9 mmol/L (ref 3.5–5.1)
Sodium: 139 mmol/L (ref 135–145)
Sodium: 141 mmol/L (ref 135–145)
Total Bilirubin: 0.6 mg/dL (ref 0.0–1.2)
Total Bilirubin: 0.6 mg/dL (ref 0.0–1.2)
Total Protein: 6.1 g/dL — ABNORMAL LOW (ref 6.5–8.1)
Total Protein: 6.6 g/dL (ref 6.5–8.1)

## 2023-11-30 LAB — URINALYSIS, ROUTINE W REFLEX MICROSCOPIC
Bacteria, UA: NONE SEEN
Bilirubin Urine: NEGATIVE
Glucose, UA: >=500 mg/dL — AB
Ketones, ur: NEGATIVE mg/dL
Leukocytes,Ua: NEGATIVE
Nitrite: NEGATIVE
Protein, ur: NEGATIVE mg/dL
Specific Gravity, Urine: 1.012 (ref 1.005–1.030)
pH: 5 (ref 5.0–8.0)

## 2023-11-30 LAB — CBC WITH DIFFERENTIAL/PLATELET
Abs Immature Granulocytes: 0.21 10*3/uL — ABNORMAL HIGH (ref 0.00–0.07)
Basophils Absolute: 0.1 10*3/uL (ref 0.0–0.1)
Basophils Relative: 1 %
Eosinophils Absolute: 0 10*3/uL (ref 0.0–0.5)
Eosinophils Relative: 0 %
HCT: 30 % — ABNORMAL LOW (ref 39.0–52.0)
Hemoglobin: 9.6 g/dL — ABNORMAL LOW (ref 13.0–17.0)
Immature Granulocytes: 2 %
Lymphocytes Relative: 7 %
Lymphs Abs: 0.7 10*3/uL (ref 0.7–4.0)
MCH: 30.3 pg (ref 26.0–34.0)
MCHC: 32 g/dL (ref 30.0–36.0)
MCV: 94.6 fL (ref 80.0–100.0)
Monocytes Absolute: 0.6 10*3/uL (ref 0.1–1.0)
Monocytes Relative: 6 %
Neutro Abs: 8.9 10*3/uL — ABNORMAL HIGH (ref 1.7–7.7)
Neutrophils Relative %: 84 %
Platelets: 212 10*3/uL (ref 150–400)
RBC: 3.17 MIL/uL — ABNORMAL LOW (ref 4.22–5.81)
RDW: 14.5 % (ref 11.5–15.5)
WBC: 10.5 10*3/uL (ref 4.0–10.5)
nRBC: 0 % (ref 0.0–0.2)

## 2023-11-30 LAB — APTT: aPTT: 25 s (ref 24–36)

## 2023-11-30 LAB — ECHOCARDIOGRAM COMPLETE
Area-P 1/2: 3.37 cm2
MV M vel: 5.32 m/s
MV Peak grad: 113.2 mm[Hg]
S' Lateral: 1.7 cm

## 2023-11-30 LAB — SODIUM, URINE, RANDOM: Sodium, Ur: 73 mmol/L

## 2023-11-30 LAB — GLUCOSE, CAPILLARY
Glucose-Capillary: 140 mg/dL — ABNORMAL HIGH (ref 70–99)
Glucose-Capillary: 148 mg/dL — ABNORMAL HIGH (ref 70–99)
Glucose-Capillary: 154 mg/dL — ABNORMAL HIGH (ref 70–99)
Glucose-Capillary: 174 mg/dL — ABNORMAL HIGH (ref 70–99)

## 2023-11-30 LAB — CREATININE, URINE, RANDOM: Creatinine, Urine: 98 mg/dL

## 2023-11-30 LAB — RPR: RPR Ser Ql: NONREACTIVE

## 2023-11-30 LAB — PROTIME-INR
INR: 1.2 (ref 0.8–1.2)
Prothrombin Time: 15.1 s (ref 11.4–15.2)

## 2023-11-30 LAB — TSH: TSH: 1.859 u[IU]/mL (ref 0.350–4.500)

## 2023-11-30 LAB — VITAMIN B12: Vitamin B-12: 585 pg/mL (ref 180–914)

## 2023-11-30 LAB — MRSA NEXT GEN BY PCR, NASAL: MRSA by PCR Next Gen: NOT DETECTED

## 2023-11-30 LAB — CBG MONITORING, ED: Glucose-Capillary: 167 mg/dL — ABNORMAL HIGH (ref 70–99)

## 2023-11-30 MED ORDER — GLUCERNA CARBSTEADY PO LIQD: 237.0000 mL | Freq: Two times a day (BID) | ORAL | Status: DC

## 2023-11-30 MED ORDER — ONDANSETRON HCL 4 MG PO TABS: 4.0000 mg | ORAL_TABLET | Freq: Four times a day (QID) | ORAL | Status: DC | PRN

## 2023-11-30 MED ORDER — SENNOSIDES-DOCUSATE SODIUM 8.6-50 MG PO TABS
1.0000 | ORAL_TABLET | Freq: Two times a day (BID) | ORAL | Status: DC
  Filled 2023-11-30: qty 1

## 2023-11-30 MED ORDER — LORAZEPAM 2 MG/ML IJ SOLN: 2.0000 mg | INTRAMUSCULAR | Status: DC | PRN

## 2023-11-30 MED ORDER — METOPROLOL SUCCINATE ER 25 MG PO TB24: 12.5000 mg | ORAL_TABLET | Freq: Every day | ORAL | Status: DC

## 2023-11-30 MED ORDER — METOPROLOL SUCCINATE ER 25 MG PO TB24: 50.0000 mg | ORAL_TABLET | Freq: Every day | ORAL | Status: DC

## 2023-11-30 MED ORDER — PANTOPRAZOLE SODIUM 40 MG IV SOLR
40.0000 mg | Freq: Every day | INTRAVENOUS | Status: DC
  Administered 2023-11-30 – 2023-12-05 (×6): 40 mg via INTRAVENOUS
  Filled 2023-11-30 (×6): qty 10

## 2023-11-30 MED ORDER — ATORVASTATIN CALCIUM 40 MG PO TABS: 40.0000 mg | ORAL_TABLET | Freq: Every day | ORAL | Status: DC

## 2023-11-30 MED ORDER — ORAL CARE MOUTH RINSE: 15.0000 mL | OROMUCOSAL | Status: DC | PRN

## 2023-11-30 MED ORDER — CHLORHEXIDINE GLUCONATE CLOTH 2 % EX PADS
6.0000 | MEDICATED_PAD | Freq: Every day | CUTANEOUS | Status: DC
  Administered 2023-11-30 – 2023-12-09 (×11): 6 via TOPICAL

## 2023-11-30 MED ORDER — LACTATED RINGERS IV BOLUS
1000.0000 mL | Freq: Once | INTRAVENOUS | Status: AC
Start: 2023-11-30 — End: 2023-11-30
  Administered 2023-11-30: 1000 mL via INTRAVENOUS

## 2023-11-30 MED ORDER — SENNOSIDES-DOCUSATE SODIUM 8.6-50 MG PO TABS: 1.0000 | ORAL_TABLET | Freq: Two times a day (BID) | ORAL | Status: DC | PRN

## 2023-11-30 MED ORDER — SODIUM CHLORIDE 0.9% FLUSH
3.0000 mL | Freq: Two times a day (BID) | INTRAVENOUS | Status: DC
  Administered 2023-11-30 – 2023-12-28 (×58): 3 mL via INTRAVENOUS
  Administered 2023-12-28: 20 mL via INTRAVENOUS
  Administered 2023-12-29 – 2024-01-01 (×7): 3 mL via INTRAVENOUS

## 2023-11-30 MED ORDER — ACETAMINOPHEN 650 MG RE SUPP
650.0000 mg | Freq: Four times a day (QID) | RECTAL | Status: DC | PRN
Start: 2023-11-30 — End: 2023-12-04
  Administered 2023-11-30 – 2023-12-02 (×4): 650 mg via RECTAL
  Filled 2023-11-30 (×4): qty 1

## 2023-11-30 MED ORDER — POLYETHYLENE GLYCOL 3350 17 GM/SCOOP PO POWD: 17.0000 g | Freq: Two times a day (BID) | ORAL | Status: DC | PRN

## 2023-11-30 MED ORDER — INSULIN ASPART 100 UNIT/ML IJ SOLN: 0.0000 [IU] | Freq: Every day | INTRAMUSCULAR | Status: DC

## 2023-11-30 MED ORDER — LACTATED RINGERS IV SOLN: INTRAVENOUS | Status: AC

## 2023-11-30 MED ORDER — FERROUS SULFATE 325 (65 FE) MG PO TABS: 325.0000 mg | ORAL_TABLET | Freq: Every day | ORAL | Status: DC

## 2023-11-30 MED ORDER — ACETAMINOPHEN 325 MG PO TABS: 650.0000 mg | ORAL_TABLET | Freq: Four times a day (QID) | ORAL | Status: DC | PRN

## 2023-11-30 MED ORDER — STROKE: EARLY STAGES OF RECOVERY BOOK
Freq: Once | Status: AC
  Administered 2023-12-01: 1
  Filled 2023-11-30: qty 1

## 2023-11-30 MED ORDER — FAMOTIDINE 20 MG PO TABS: 20.0000 mg | ORAL_TABLET | Freq: Every day | ORAL | Status: DC

## 2023-11-30 MED ORDER — ONDANSETRON HCL 4 MG/2ML IJ SOLN
4.0000 mg | Freq: Four times a day (QID) | INTRAMUSCULAR | Status: DC | PRN
  Administered 2023-11-30: 4 mg via INTRAVENOUS
  Filled 2023-11-30: qty 2

## 2023-11-30 MED ORDER — INSULIN ASPART 100 UNIT/ML IJ SOLN: 0.0000 [IU] | Freq: Three times a day (TID) | INTRAMUSCULAR | Status: DC

## 2023-11-30 MED ORDER — LABETALOL HCL 5 MG/ML IV SOLN: 10.0000 mg | INTRAVENOUS | Status: DC | PRN

## 2023-11-30 MED ORDER — ACETAMINOPHEN 650 MG RE SUPP: 650.0000 mg | Freq: Four times a day (QID) | RECTAL | Status: DC | PRN

## 2023-11-30 NOTE — Consult Note (Signed)
NAME:  William Todd, MRN:  [id number], DOB:  11/19/1950, LOS: 0 ADMISSION DATE:  11/29/2023, CONSULTATION DATE:  11/29/2023 REFERRING MD:  Dr. Clayborne Dana - EDP, CHIEF COMPLAINT:  SAH with IPH   History of Present Illness:  William Todd is a 73 y.o. male with a past medical history significant for HTN, recurrent mechanical falls, protein calorie malnutrition, type 2 diabetes, chronic kidney disease, and prostate cancer 2014 who presented to the ED via EMS from [facility] after suffering ground-level fall at [facility].  Workup on admission revealed acute intraparenchymal hemorrhage of the left frontal lobe with multiple bilateral small volume SAH doubt shift, neurosurgery was consulted with no surgical interventions recommended.  Patient was admitted initially per TRH.   While boarding in the emergency department patient had repeat CT scan to follow-up on ICH which revealed progressive left frontal hematoma with intraventricular extension into the fourth ventricle resulting in anterior midline shift of 5 mm, no hydrocephalus.  Neurosurgery reviewed imaging and recommended admission to ICU under PCCM for close monitoring.   Pertinent  Medical History  HTN, recurrent mechanical falls, protein calorie malnutrition, type 2 diabetes, chronic kidney disease, and prostate cancer 2014   Significant Hospital Events: Including procedures, antibiotic start and stop dates in addition to other pertinent events   2/15 presented after ground-level fall from [facility] workup revealed ICH with bilateral [facility] 2/16 repeat CT while boarding in ED revealed progressive left frontal hematoma with intraventricular extension into the fourth ventricle resulting in anterior midline shift of 5 mm, no hydrocephalus.  Admission changed to ICU  Interim History / Subjective:  Seen lying in ED stretcher in no acute distress, will arouse to verbal stimuli and appears to comprehend questions/commands but does not follow request  Objective    Blood pressure 111/66, pulse 81, temperature 97.7 F (36.5 C), temperature source Oral, resp. rate 20, SpO2 98%.        Intake/Output Summary (Last 24 hours) at 11/30/2023 1018 Last data filed at 11/30/2023 0434 Gross per 24 hour  Intake --  Output 700 ml  Net -700 ml   There were no vitals filed for this visit.  Examination: General: Acute on chronic ill-appearing deconditioned thin elderly male lying on ED stretcher in no acute distress HEENT: [place]/AT, MM pink/moist, PERRL,  Neuro: Will arouse to verbal stimuli and appears to comprehend questions/commands but does not follow request, seen moving bilateral upper extremities  CV: s1s2 regular rate and rhythm, no murmur, rubs, or gallops,  PULM: Clear to auscultation bilaterally, no increased work of breathing, no added breath sounds GI: soft, bowel sounds active in all 4 quadrants, non-tender, non-distended Extremities: warm/dry, no edema  Skin: no rashes or lesions   Resolved Hospital Problem list     Assessment & Plan:  Left frontal intracerebral traumatic hematoma Traumatic bilateral subarachnoid hemorrhages -Head CT on admission with acute intraparenchymal hemorrhage of the left frontal lobe with multiple bilateral small volume SAH doubt shift -While boarding in the emergency department repeat CT  revealed progressive left frontal hematoma with intraventricular extension into the fourth ventricle resulting in anterior midline shift of 5 mm, no hydrocephalus.  P: Primary management per neurosurgery Admit to neuro ICU Frequent neurochecks Neuroprotective measures Seizure precautions Aspiration precautions Repeat imaging per neurosurgery  History of mood disorder History of memory impairment -No maintenance medication at baseline.  Appears patient was able to state name and year on admission however while in the emergency department patient has become less interactive and now  unable/unwilling to follow commands.  This is in  the setting of worsening ICH P: Neuroprotective measures as above Supportive care Delirium precautions  Acute kidney injury superimposed on CKD stage IIIb -Creatinine on admission 1.87 with GFR 37 compared to creatinine 1.49 with GFR 49 11/23/2023 P: Follow renal function  Monitor urine output Trend Bmet Avoid nephrotoxins Ensure adequate renal perfusion   Recurrent mechanical falls with history of recent left femoral neck fracture s/p arthroplasty 11/16/2023 and recent left ankle fracture P: Fall precautions  Essential hypertension -Home medication includes Norvasc, aspirin, Lipitor, Toprol-XL P: Hold oral antihypertensives currently as patient is high risk for aspiration As needed IV antihypertensives Continuous telemetry  Type 2 diabetes -Most recent hemoglobin A1c 6.4 11/16/2023 Home medications include Januvia and Amaryl P: SSI CBG goal 140-180 CBG checks every 4  Normocytic anemia P: Trend CBC Transfuse per protocol Hemoglobin goal greater than 7    Best Practice (right click and "Reselect all SmartList Selections" daily)   Diet/type: NPO DVT prophylaxis SCD Pressure ulcer(s): N/A GI prophylaxis: PPI Lines: N/A Foley:  N/A Code Status:  full code Last date of multidisciplinary goals of care discussion: pending   Labs   CBC: Recent Labs  Lab 11/29/23 2342 11/30/23 0400  WBC 10.5 9.9  NEUTROABS 8.9*  --   HGB 9.6* 8.7*  HCT 30.0* 26.7*  MCV 94.6 94.0  PLT 212 203    Basic Metabolic Panel: Recent Labs  Lab 11/29/23 2342 11/30/23 0400  NA 139 141  K 4.9 4.6  CL 103 105  CO2 25 25  GLUCOSE 262* 154*  BUN 33* 29*  CREATININE 1.87* 1.57*  CALCIUM 9.3 9.0   GFR: CrCl cannot be calculated (Unknown ideal weight.). Recent Labs  Lab 11/29/23 2342 11/30/23 0400  WBC 10.5 9.9    Liver Function Tests: Recent Labs  Lab 11/29/23 2342 11/30/23 0400  AST 43* 43*  ALT 51* 45*  ALKPHOS 89 90  BILITOT 0.6 0.6  PROT 6.6 6.1*  ALBUMIN 3.1*  2.9*   No results for input(s): "LIPASE", "AMYLASE" in the last 168 hours. No results for input(s): "AMMONIA" in the last 168 hours.  ABG    Component Value Date/Time   TCO2 25 01/01/2013 1312     Coagulation Profile: Recent Labs  Lab 11/30/23 0400  INR 1.2    Cardiac Enzymes: No results for input(s): "CKTOTAL", "CKMB", "CKMBINDEX", "TROPONINI" in the last 168 hours.  HbA1C: Hgb A1c MFr Bld  Date/Time Value Ref Range Status  11/16/2023 06:00 AM 6.4 (H) 4.8 - 5.6 % Final    Comment:    (NOTE) Pre diabetes:          5.7%-6.4%  Diabetes:              >6.4%  Glycemic control for   <7.0% adults with diabetes   01/01/2013 04:01 PM 6.0 (H) <5.7 % Final    Comment:    (NOTE)                                                                       According to the ADA Clinical Practice Recommendations for 2011, when HbA1c is used as a screening test:  >=6.5%   Diagnostic of Diabetes Mellitus           (  if abnormal result is confirmed) 5.7-6.4%   Increased risk of developing Diabetes Mellitus References:Diagnosis and Classification of Diabetes Mellitus,Diabetes Care,2011,34(Suppl 1):S62-S69 and Standards of Medical Care in         Diabetes - 2011,Diabetes Care,2011,34 (Suppl 1):S11-S61.    CBG: Recent Labs  Lab 11/30/23 0621  GLUCAP 167*    Review of Systems:   Unable to assess   Past Medical History:  He,  has a past medical history of Anxiety, Chronic kidney disease, Diabetes mellitus, History of radiation therapy (09/29/13- 11/25/13), Hypertension, and Prostate cancer (HCC) (06/01/2013).   Surgical History:   Past Surgical History:  Procedure Laterality Date   CARDIAC CATHETERIZATION  01/01/2013   FRACTURE SURGERY     left foot    HIP ARTHROPLASTY Left 11/16/2023   Procedure: ARTHROPLASTY BIPOLAR HIP (HEMIARTHROPLASTY);  Surgeon: Sheral Apley, MD;  Location: [facility] OR;  Service: Orthopedics;  Laterality: Left;   LEFT HEART CATHETERIZATION WITH CORONARY  ANGIOGRAM N/A 01/01/2013   Procedure: LEFT HEART CATHETERIZATION WITH CORONARY ANGIOGRAM;  Surgeon: Robynn Pane, MD;  Location: [facility] CATH LAB;  Service: Cardiovascular;  Laterality: N/A;   PROSTATE BIOPSY  06/01/13     Social History:   reports that he has never smoked. He has never used smokeless tobacco. He reports that he does not drink alcohol and does not use drugs.   Family History:  His family history includes Prostate cancer (age of onset: 2) in his brother.   Allergies No Known Allergies   Home Medications  Prior to Admission medications   Medication Sig Start Date End Date Taking? Authorizing Provider  acetaminophen (TYLENOL) 325 MG tablet Take 3 tablets (975 mg total) by mouth every 8 (eight) hours for 5 days, THEN 3 tablets (975 mg total) every 8 (eight) hours as needed for up to 25 days. 11/19/23 12/19/23 Yes Almon Hercules, MD  amLODipine (NORVASC) 5 MG tablet Take 1 tablet (5 mg total) by mouth daily. 11/19/23  Yes Almon Hercules, MD  aspirin EC 81 MG tablet Take 1 tablet (81 mg total) by mouth in the morning and at bedtime for 28 days, THEN 1 tablet (81 mg total) daily. 11/19/23 03/16/24 Yes Almon Hercules, MD  atorvastatin (LIPITOR) 80 MG tablet Take 0.5 tablets (40 mg total) by mouth daily at 6 PM. 01/03/13  Yes Rinaldo Cloud, MD  famotidine (PEPCID) 20 MG tablet Take 1 tablet (20 mg total) by mouth 2 (two) times daily. Patient taking differently: Take 20 mg by mouth daily. 01/03/13  Yes Rinaldo Cloud, MD  metoprolol succinate (TOPROL-XL) 50 MG 24 hr tablet Take 1 tablet (50 mg total) by mouth daily. Take with or immediately following a meal. 01/03/13  Yes Rinaldo Cloud, MD  Nutritional Supplements (GLUCERNA CARBSTEADY) LIQD Take 237 mLs by mouth 2 (two) times daily after a meal. Patient taking differently: Take 237 mLs by mouth 3 (three) times daily with meals. 11/20/23  Yes Almon Hercules, MD  sitaGLIPtin (JANUVIA) 50 MG tablet Take 50 mg by mouth daily.   Yes [provider]   glimepiride (AMARYL) 4 MG tablet  08/30/13   [provider]  nitroGLYCERIN (NITROSTAT) 0.4 MG SL tablet Place 1 tablet (0.4 mg total) under the tongue every 5 (five) minutes x 3 doses as needed for chest pain. Patient not taking: Reported on 11/30/2023 01/03/13   Rinaldo Cloud, MD  polyethylene glycol powder (MIRALAX) 17 GM/SCOOP powder Take 17 g by mouth 2 (two) times daily as needed for  moderate constipation. Patient not taking: Reported on 11/30/2023 11/19/23   Almon Hercules, MD  senna-docusate (SENOKOT-S) 8.6-50 MG tablet Take 1 tablet by mouth 2 (two) times daily between meals as needed for mild constipation. Patient not taking: Reported on 11/30/2023 11/19/23   Almon Hercules, MD     Critical care time:   CRITICAL CARE Performed by: Liya Strollo D. Harris   Total critical care time: 40 minutes  Critical care time was exclusive of separately billable procedures and treating other patients.  Critical care was necessary to treat or prevent imminent or life-threatening deterioration.  Critical care was time spent personally by me on the following activities: development of treatment plan with patient and/or surrogate as well as nursing, discussions with consultants, evaluation of patient's response to treatment, examination of patient, obtaining history from patient or surrogate, ordering and performing treatments and interventions, ordering and review of laboratory studies, ordering and review of radiographic studies, pulse oximetry and re-evaluation of patient's condition.  Shadasia Oldfield D. Harris, NP-C [facility] Personal contact information can be found on Amion  If no contact or response made please call [phone] 11/30/2023, 10:54 AM

## 2023-11-30 NOTE — H&P (Addendum)
History and Physical    William Todd HQI:[id number] DOB: December 13, 1950 DOA: 11/29/2023  PCP: Knox Royalty, MD   Patient coming from: Home   Chief Complaint:  Chief Complaint  Patient presents with   Fall   ED TRIAGE note:  Pt BIB [facility] from [facility]. Per staff pt was walking around the unit and fell, landing on left side, denies LOC. Not on blood thinners. GCS 13; per facility this is baseline       HPI:  William Todd is a 73 y.o. male with medical history significant of recurrent mechanical fall associated with left femoral neck fracture 11/16/2023 status post left hemiarthroplasty discharged home on 2/6, left foot fracture with prosthesis,  essential hypertension, non-insulin-dependent DM type II normocytic anemia, mood disorder, protein calorie malnutrition, prostate cancer, and hyperlipidemia presented to emergency department from nursing home as patient had an unintentional fall at nursing home.  Since then patient is complaining about left-sided lower extremity pain.   ED Course:  At presentation to ED patient is hemodynamically stable. CMP showing elevated blood glucose 262, elevated creatinine 1.68, low albumin 3.1, transaminitis elevated AST 43, ALT 51 and low GFR 31. CBC unremarkable stable H&H 9.6 and 13. Pending UA.  CT head acute intraparenchymal hemorrhage of the left frontal lobe with multifocal bilateral small volume subarachnoid hemorrhage without any midline shift. CT cervical spine no acute fracture and traumatic.  Chest x-ray asymmetrical infiltrate within the medial lung zone concern for infectious versus posttraumatic given history of recent trauma.  X-ray pelvis showed left total hip arthroplasty.  No acute fracture or dislocation.  ED physician Dr. Clayborne Dana has been consulted and spoke with neurosurgery Dr.Elsner who recommended repeat head CT scan in 8 hours around 9 AM.  No other recommendation at this time and neurosurgery team will see the patient  in the morning.   Significant labs in the ED: Lab Orders         CBC with Differential         Comprehensive metabolic panel         Urinalysis, Routine w reflex microscopic -Urine, Catheterized         CBC         Comprehensive metabolic panel         APTT         Protime-INR         Creatinine, urine, random         Sodium, urine, random         TSH         Ammonia         Vitamin B12         RPR       Review of Systems:  Review of Systems  Unable to perform ROS: Mental status change    Past Medical History:  Diagnosis Date   Anxiety    new dx   Chronic kidney disease    Diabetes mellitus    type ii   History of radiation therapy 09/29/13- 11/25/13   prostate 7800 cGy 40 sessions, seminal vesicles 5600 cGy 40 sessions   Hypertension    Prostate cancer (HCC) 06/01/2013   gleason 7    Past Surgical History:  Procedure Laterality Date   CARDIAC CATHETERIZATION  01/01/2013   FRACTURE SURGERY     left foot    HIP ARTHROPLASTY Left 11/16/2023   Procedure: ARTHROPLASTY BIPOLAR HIP (HEMIARTHROPLASTY);  Surgeon: Sheral Apley, MD;  Location: [facility] OR;  Service: Orthopedics;  Laterality: Left;   LEFT HEART CATHETERIZATION WITH CORONARY ANGIOGRAM N/A 01/01/2013   Procedure: LEFT HEART CATHETERIZATION WITH CORONARY ANGIOGRAM;  Surgeon: Robynn Pane, MD;  Location: [facility] CATH LAB;  Service: Cardiovascular;  Laterality: N/A;   PROSTATE BIOPSY  06/01/13     reports that he has never smoked. He has never used smokeless tobacco. He reports that he does not drink alcohol and does not use drugs.  No Known Allergies  Family History  Problem Relation Age of Onset   Prostate cancer Brother 81       seed implant    Prior to Admission medications   Medication Sig Start Date End Date Taking? Authorizing Provider  acetaminophen (TYLENOL) 325 MG tablet Take 3 tablets (975 mg total) by mouth every 8 (eight) hours for 5 days, THEN 3 tablets (975 mg total) every 8 (eight) hours as  needed for up to 25 days. 11/19/23 12/19/23  Almon Hercules, MD  amLODipine (NORVASC) 5 MG tablet Take 1 tablet (5 mg total) by mouth daily. 11/19/23   Almon Hercules, MD  aspirin EC 81 MG tablet Take 1 tablet (81 mg total) by mouth in the morning and at bedtime for 28 days, THEN 1 tablet (81 mg total) daily. 11/19/23 03/16/24  Almon Hercules, MD  atorvastatin (LIPITOR) 80 MG tablet Take 0.5 tablets (40 mg total) by mouth daily at 6 PM. 01/03/13   Rinaldo Cloud, MD  famotidine (PEPCID) 20 MG tablet Take 1 tablet (20 mg total) by mouth 2 (two) times daily. Patient taking differently: Take 20 mg by mouth daily. 01/03/13   Rinaldo Cloud, MD  glimepiride (AMARYL) 4 MG tablet  08/30/13   [provider]  metoprolol succinate (TOPROL-XL) 50 MG 24 hr tablet Take 1 tablet (50 mg total) by mouth daily. Take with or immediately following a meal. 01/03/13   Rinaldo Cloud, MD  nitroGLYCERIN (NITROSTAT) 0.4 MG SL tablet Place 1 tablet (0.4 mg total) under the tongue every 5 (five) minutes x 3 doses as needed for chest pain. 01/03/13   Rinaldo Cloud, MD  Nutritional Supplements (GLUCERNA CARBSTEADY) LIQD Take 237 mLs by mouth 2 (two) times daily after a meal. 11/20/23   Gonfa, Boyce Medici, MD  polyethylene glycol powder (MIRALAX) 17 GM/SCOOP powder Take 17 g by mouth 2 (two) times daily as needed for moderate constipation. 11/19/23   Almon Hercules, MD  senna-docusate (SENOKOT-S) 8.6-50 MG tablet Take 1 tablet by mouth 2 (two) times daily between meals as needed for mild constipation. 11/19/23   Almon Hercules, MD  sitaGLIPtin (JANUVIA) 50 MG tablet Take 50 mg by mouth daily.    [provider]     Physical Exam: Vitals:   11/30/23 0251 11/30/23 0300 11/30/23 0320 11/30/23 0400  BP:  119/60 111/76 (!) 149/75  Pulse:  76 78 90  Resp:  16 17 17   Temp: 99.2 F (37.3 C)     TempSrc: Rectal     SpO2:  100% 100% 100%    Physical Exam Vitals and nursing note reviewed.  HENT:     Mouth/Throat:     Mouth: Mucous  membranes are moist.  Eyes:     Conjunctiva/sclera: Conjunctivae normal.  Cardiovascular:     Rate and Rhythm: Normal rate and regular rhythm.     Pulses: Normal pulses.     Heart sounds: Normal heart sounds.  Pulmonary:     Effort: Pulmonary effort is normal.  Breath sounds: Normal breath sounds.  Abdominal:     General: Bowel sounds are normal.  Musculoskeletal:     Cervical back: Neck supple.     Right lower leg: No edema.     Left lower leg: No edema.  Skin:    Capillary Refill: Capillary refill takes less than 2 seconds.  Neurological:     Mental Status: He is alert.     Comments: Patient is alert but not completely oriented. Unable to assess for any cranial nerve deficit, sensory deficit, weakness, coordination and abnormal gait.  Psychiatric:     Comments: Unable to assess      Labs on Admission: I have personally reviewed following labs and imaging studies  CBC: Recent Labs  Lab 11/29/23 2342  WBC 10.5  NEUTROABS 8.9*  HGB 9.6*  HCT 30.0*  MCV 94.6  PLT 212   Basic Metabolic Panel: Recent Labs  Lab 11/29/23 2342  NA 139  K 4.9  CL 103  CO2 25  GLUCOSE 262*  BUN 33*  CREATININE 1.87*  CALCIUM 9.3   GFR: Estimated Creatinine Clearance: 33.2 mL/min (A) (by C-G formula based on SCr of 1.87 mg/dL (H)). Liver Function Tests: Recent Labs  Lab 11/29/23 2342  AST 43*  ALT 51*  ALKPHOS 89  BILITOT 0.6  PROT 6.6  ALBUMIN 3.1*   No results for input(s): "LIPASE", "AMYLASE" in the last 168 hours. No results for input(s): "AMMONIA" in the last 168 hours. Coagulation Profile: No results for input(s): "INR", "PROTIME" in the last 168 hours. Cardiac Enzymes: No results for input(s): "CKTOTAL", "CKMB", "CKMBINDEX", "TROPONINI", "TROPONINIHS" in the last 168 hours. BNP (last 3 results) No results for input(s): "BNP" in the last 8760 hours. HbA1C: No results for input(s): "HGBA1C" in the last 72 hours. CBG: No results for input(s): "GLUCAP" in the  last 168 hours. Lipid Profile: No results for input(s): "CHOL", "HDL", "LDLCALC", "TRIG", "CHOLHDL", "LDLDIRECT" in the last 72 hours. Thyroid Function Tests: No results for input(s): "TSH", "T4TOTAL", "FREET4", "T3FREE", "THYROIDAB" in the last 72 hours. Anemia Panel: No results for input(s): "VITAMINB12", "FOLATE", "FERRITIN", "TIBC", "IRON", "RETICCTPCT" in the last 72 hours. Urine analysis:    Component Value Date/Time   COLORURINE YELLOW 11/16/2023 0446   APPEARANCEUR HAZY (A) 11/16/2023 0446   LABSPEC 1.013 11/16/2023 0446   PHURINE 5.0 11/16/2023 0446   GLUCOSEU 50 (A) 11/16/2023 0446   HGBUR NEGATIVE 11/16/2023 0446   BILIRUBINUR NEGATIVE 11/16/2023 0446   KETONESUR NEGATIVE 11/16/2023 0446   PROTEINUR NEGATIVE 11/16/2023 0446   NITRITE NEGATIVE 11/16/2023 0446   LEUKOCYTESUR NEGATIVE 11/16/2023 0446    Radiological Exams on Admission: I have personally reviewed images CT Head Wo Contrast Result Date: 11/30/2023 CLINICAL DATA:  Head trauma, minor (Age >= 65y); Neck trauma (Age >= 65y) EXAM: CT HEAD WITHOUT CONTRAST CT CERVICAL SPINE WITHOUT CONTRAST TECHNIQUE: Multidetector CT imaging of the head and cervical spine was performed following the standard protocol without intravenous contrast. Multiplanar CT image reconstructions of the cervical spine were also generated. RADIATION DOSE REDUCTION: This exam was performed according to the departmental dose-optimization program which includes automated exposure control, adjustment of the mA and/or kV according to patient size and/or use of iterative reconstruction technique. COMPARISON:  MRI head November 15, 2023. FINDINGS: CT HEAD FINDINGS Brain: Acute intraparenchymal hemorrhage in the left frontal lobe measuring 2.6 cm with overlying extra-axial extension. Small volume of multifocal subarachnoid hemorrhage bilaterally along the right frontal convexity, left frontal convexity and layering within the left  sylvian fissure. No midline  shift. No hydrocephalus. Remote right occipital infarct. Vascular: Calcific atherosclerosis.  No dense vessel. Skull: No acute fracture. Sinuses/Orbits: Clear sinuses.  No acute orbital findings. Other: No mastoid effusions. CT CERVICAL SPINE FINDINGS Alignment: No substantial sagittal subluxation. Skull base and vertebrae: No acute fracture. Soft tissues and spinal canal: No prevertebral fluid or swelling. No visible canal hematoma. Disc levels:  Moderate multilevel degenerative change. Upper chest: Visualized lung apices are clear. IMPRESSION: 1. Acute intraparenchymal hemorrhage in left frontal lobe with multifocal bilateral small volume subarachnoid hemorrhage. No midline shift. 2. No acute fracture or traumatic malalignment in the cervical spine. Findings discussed with Dr. Clayborne Dana via telephone at 2:19 a.m. Electronically Signed   By: Feliberto Harts M.D.   On: 11/30/2023 02:21   CT Cervical Spine Wo Contrast Result Date: 11/30/2023 CLINICAL DATA:  Head trauma, minor (Age >= 65y); Neck trauma (Age >= 65y) EXAM: CT HEAD WITHOUT CONTRAST CT CERVICAL SPINE WITHOUT CONTRAST TECHNIQUE: Multidetector CT imaging of the head and cervical spine was performed following the standard protocol without intravenous contrast. Multiplanar CT image reconstructions of the cervical spine were also generated. RADIATION DOSE REDUCTION: This exam was performed according to the departmental dose-optimization program which includes automated exposure control, adjustment of the mA and/or kV according to patient size and/or use of iterative reconstruction technique. COMPARISON:  MRI head November 15, 2023. FINDINGS: CT HEAD FINDINGS Brain: Acute intraparenchymal hemorrhage in the left frontal lobe measuring 2.6 cm with overlying extra-axial extension. Small volume of multifocal subarachnoid hemorrhage bilaterally along the right frontal convexity, left frontal convexity and layering within the left sylvian fissure. No midline shift.  No hydrocephalus. Remote right occipital infarct. Vascular: Calcific atherosclerosis.  No dense vessel. Skull: No acute fracture. Sinuses/Orbits: Clear sinuses.  No acute orbital findings. Other: No mastoid effusions. CT CERVICAL SPINE FINDINGS Alignment: No substantial sagittal subluxation. Skull base and vertebrae: No acute fracture. Soft tissues and spinal canal: No prevertebral fluid or swelling. No visible canal hematoma. Disc levels:  Moderate multilevel degenerative change. Upper chest: Visualized lung apices are clear. IMPRESSION: 1. Acute intraparenchymal hemorrhage in left frontal lobe with multifocal bilateral small volume subarachnoid hemorrhage. No midline shift. 2. No acute fracture or traumatic malalignment in the cervical spine. Findings discussed with Dr. Clayborne Dana via telephone at 2:19 a.m. Electronically Signed   By: Feliberto Harts M.D.   On: 11/30/2023 02:21   DG Chest Portable 1 View Result Date: 11/30/2023 CLINICAL DATA:  Fall EXAM: PORTABLE CHEST 1 VIEW COMPARISON:  03/05/2012 FINDINGS: There is asymmetric airspace infiltrate within the right mid lung zone, possibly infectious or posttraumatic given the history of recent trauma. No pneumothorax or pleural effusion. Cardiac size within normal limits. Pulmonary vascularity is normal. No acute bone abnormality. IMPRESSION: 1. Asymmetric airspace infiltrate within the right mid lung zone, possibly infectious or posttraumatic given the history of recent trauma. Electronically Signed   By: Helyn Numbers M.D.   On: 11/30/2023 01:18   DG Pelvis Portable Result Date: 11/30/2023 CLINICAL DATA:  Fall EXAM: PORTABLE PELVIS 1-2 VIEWS COMPARISON:  None Available. FINDINGS: Left total hip arthroplasty has been performed. No acute fracture or dislocation. Mild right hip degenerative arthritis. Vascular calcifications noted. Soft tissues are otherwise unremarkable. IMPRESSION: 1. Left total hip arthroplasty. No acute fracture or dislocation.  Electronically Signed   By: Helyn Numbers M.D.   On: 11/30/2023 01:17   EKG showing sinus rhythm heart rate 85.  Nointraventricular conduction delay.   Assessment/Plan: Principal Problem:  Subarachnoid hemorrhage (HCC) Active Problems:   Intraparenchymal hemorrhage of brain (HCC)   Acute metabolic encephalopathy   History of closed left femoral fracture s/p arthroplasty (HCC)   Long-term memory impairment   Acute kidney injury superimposed on chronic kidney disease (HCC)   Essential hypertension   History of prostate cancer   Non-insulin dependent type 2 diabetes mellitus (HCC)   Normocytic anemia   History of mood disorder   HLD (hyperlipidemia)    Assessment and Plan: Intraparenchymal hemorrhage-frontal lobe Subarachnoid hemorrhage-multifocal bilateral History of recurrent mechanical fall > Patient presenting with mechanical fall at nursing home.  Denies any loss of consciousness hitting of the head. -Patient is hemodynamically stable. - CT head showed acute intraparenchymal hemorrhages with multifocal bilateral small volume subarachnoid hemorrhage without any midline shift.  No acute fracture or traumatic misalignment of the cervical spine - ED physician Dr. Marlene Bast has been consulted spoke with neurosurgery Dr. Danielle Dess recommended admit to hospitalist service and will evaluate patient in the morning.  Recommending to repeat the head CT scan 8 hours apart around 9 AM.  Neurology team will see the patient - Patient is not any blood thinner except aspirin.  Hold aspirin and any further pharmacological DVT prophylaxis. -Neurocheck every 4 hours, vitals every 4 hours - Continue fall precaution, seizure precaution and Ativan as needed for any evidence of seizure.  Acute metabolic encephalopathy versus sundowning - During my evaluation at the bedside patient is opening his eyes with voice command however he is showing resistant of the upper extremity.  Not able to follow any command.   At initial presentation to ED patient was able to state today's date and year but now his mentation has been changed as compared to past initial arrival to the ED. -Concern for development of acute metabolic encephalopathy versus sundowning/delirium in the setting of intracranial hemorrhage and acuity of the condition. - Checking TSH, vitamin B12, RPR and ammonia level. - Holding anything by mouth as risk for aspiration. - Spoke with patient's RN at the bedside if noted significant change of mentation further in that case need to obtain and stat head CT scan. -Keeping patient n.p.o. at risk for aspiration. - Continue IV fluid and IV medications only. -Continue delirium precaution.  Informed continue frequent reorientation   Acute kidney injury superimposed CKD stage IIIb -Creatinine 1.87.  Patient had an episodes of acute kidney injury when admitted 2 weeks ago afterward creatinine has been improved to 1.4.  Again creatinine has been trending up.  Baseline GFR in between 36-46 - Prerenal acute kidney injury versus this is patient's new renal baseline function. - Holding amlodipine in the setting of renal injury.  Lisinopril has been discontinued during last discharge from the hospital - Checking UA, urine creatinine and sodium - Continue monitor urine output - Starting LR 75 cc/h. - Encouraging oral fluid intake   Recent left femoral neck fracture status post arthroplasty 11/16/2023 History of left ankle fracture on prosthesis History of recurrent fall -Patient was admitted 2/1 to 2/6, during that time patient presented with a mechanical fall and found to have left femoral neck fracture and underwent left hip arthroplasty 2/2.  Afterward patient was discharged to SNF.   Essential hypertension - In the setting of altered mentation and risk for aspiration holding oral blood pressure regimen.   Non-insulin-dependent DM type II -A1c 6.4 - Holding Januvia and Amaryl. Continue to check POC  blood glucose every 4 hours to make sure patient does not develop any hypoglycemia in  the setting of NPO.   History of prostate cancer -Per chart review patient has history of prostate adenocarcinomas in 2015 status post external beam/IMRT.  Unable to find out any further follow-up or acute on the chart.  Normocytic anemia -Stable H&H 9.6 and 30. Continue to monitor.  History of mood disorder History of memory impairment -At home patient is not any medication at home for mood stabilizer.  Continue delirium precaution and frequent reorientation.  Hyperlipidemia - Holding oral Lipitor  GERD -Holding oral Pepcid   DVT prophylaxis:  SCDs Code Status:  Full Code-by default Diet: Heart healthy carb modified diet Family Communication:   Family was present at bedside, at the time of interview. Opportunity was given to ask question and all questions were answered satisfactorily.  Disposition Plan: Need to follow-up with repeat head CT scan which has been ordered for 8 AM.  Neurosurgery formal consult to follow Consults: Neurosurgery Admission status:   Inpatient, Step Down Unit  Severity of Illness: The appropriate patient status for this patient is INPATIENT. Inpatient status is judged to be reasonable and necessary in order to provide the required intensity of service to ensure the patient's safety. The patient's presenting symptoms, physical exam findings, and initial radiographic and laboratory data in the context of their chronic comorbidities is felt to place them at high risk for further clinical deterioration. Furthermore, it is not anticipated that the patient will be medically stable for discharge from the hospital within 2 midnights of admission.   * I certify that at the point of admission it is my clinical judgment that the patient will require inpatient hospital care spanning beyond 2 midnights from the point of admission due to high intensity of service, high risk for further  deterioration and high frequency of surveillance required.Marland Kitchen    Tereasa Coop, MD [facility]  How to contact the [facility] Attending or Consulting provider 7A - 7P or covering provider during after hours 7P -7A, for this patient.  Check the care team in [facility] and look for a) attending/consulting [facility] provider listed and b) the [facility] team listed Log into [email or web address] and use [facility]'s universal password to access. If you do not have the password, please contact the hospital operator. Locate the [facility] provider you are looking for under [facility] and page to a number that you can be directly reached. If you still have difficulty reaching the provider, please page the [facility] (Director on Call) for the Hospitalists listed on amion for assistance.  11/30/2023, 4:05 AM

## 2023-11-30 NOTE — Progress Notes (Signed)
Brief note: -I discussed with Dr. Danielle Dess, neurosurgeon. -According to Dr. Barnett Abu, neuro ICU/ICU will assume care. -Dr. Barnett Abu has kindly indicated that he will speak to the ICU/neuro ICU team. -[facility] team will sign off for now.

## 2023-11-30 NOTE — ED Notes (Addendum)
Pt had moderate amount of emesis while this RN was at the bedside. Pt placed in upright position. PRN zofran administered

## 2023-11-30 NOTE — ED Notes (Signed)
Dr. Danielle Dess paged via Dr. Dartha Lodge.

## 2023-11-30 NOTE — Consult Note (Signed)
Reason for Consult: Traumatic cerebral contusion Referring Physician: Dr. Daphene Calamity is an 73 y.o. male.  HPI: Patient is a 73 year old individual who was recently discharged having sustained a trochanteric fracture of his hip on 1 February.  The patient has had declining mental status at home such that his wife could not take care of him and he ended up in a skilled nursing facility after surgery.  He apparently has been somewhat disoriented but sustained a fall striking the left side of his head with a little laceration on the left orbital rim.  He was brought to the emergency department where CT scan demonstrates the presence of a left frontal cerebral contusion with subarachnoid blood in both temporal tips.  I was consulted about this at 2:30 in the morning and I advised that he should be admitted for observation.  Because of other medical conditions including diabetes the hospitalist admitted him.  A repeat CT scan was performed at 8 AM today.  The scan reveals that the cerebral contusion has coalesced into a hematoma in the left frontal region measuring approximately 60 cc.  There is more mass effect with this hemorrhage now the subarachnoid blood in the temporal tips is also present.  I have advised that the patient be monitored in the intensive care unit at this time.  Past Medical History:  Diagnosis Date   Anxiety    new dx   Chronic kidney disease    Diabetes mellitus    type ii   History of radiation therapy 09/29/13- 11/25/13   prostate 7800 cGy 40 sessions, seminal vesicles 5600 cGy 40 sessions   Hypertension    Prostate cancer (HCC) 06/01/2013   gleason 7    Past Surgical History:  Procedure Laterality Date   CARDIAC CATHETERIZATION  01/01/2013   FRACTURE SURGERY     left foot    HIP ARTHROPLASTY Left 11/16/2023   Procedure: ARTHROPLASTY BIPOLAR HIP (HEMIARTHROPLASTY);  Surgeon: Sheral Apley, MD;  Location: [facility] OR;  Service: Orthopedics;  Laterality: Left;    LEFT HEART CATHETERIZATION WITH CORONARY ANGIOGRAM N/A 01/01/2013   Procedure: LEFT HEART CATHETERIZATION WITH CORONARY ANGIOGRAM;  Surgeon: Robynn Pane, MD;  Location: [facility] CATH LAB;  Service: Cardiovascular;  Laterality: N/A;   PROSTATE BIOPSY  06/01/13    Family History  Problem Relation Age of Onset   Prostate cancer Brother 25       seed implant    Social History:  reports that he has never smoked. He has never used smokeless tobacco. He reports that he does not drink alcohol and does not use drugs.  Allergies: No Known Allergies  Medications: I have not reviewed the patient's current medications  Results for orders placed or performed during the hospital encounter of 11/29/23 (from the past 48 hours)  CBC with Differential     Status: Abnormal   Collection Time: 11/29/23 11:42 PM  Result Value Ref Range   WBC 10.5 4.0 - 10.5 K/uL   RBC 3.17 (L) 4.22 - 5.81 MIL/uL   Hemoglobin 9.6 (L) 13.0 - 17.0 g/dL   HCT 40.9 (L) 81.1 - 91.4 %   MCV 94.6 80.0 - 100.0 fL   MCH 30.3 26.0 - 34.0 pg   MCHC 32.0 30.0 - 36.0 g/dL   RDW 78.2 95.6 - 21.3 %   Platelets 212 150 - 400 K/uL   nRBC 0.0 0.0 - 0.2 %   Neutrophils Relative % 84 %   Neutro Abs 8.9 (  H) 1.7 - 7.7 K/uL   Lymphocytes Relative 7 %   Lymphs Abs 0.7 0.7 - 4.0 K/uL   Monocytes Relative 6 %   Monocytes Absolute 0.6 0.1 - 1.0 K/uL   Eosinophils Relative 0 %   Eosinophils Absolute 0.0 0.0 - 0.5 K/uL   Basophils Relative 1 %   Basophils Absolute 0.1 0.0 - 0.1 K/uL   Immature Granulocytes 2 %   Abs Immature Granulocytes 0.21 (H) 0.00 - 0.07 K/uL    Comment: Performed at [facility], [address] [place], [place] [address]  Comprehensive metabolic panel     Status: Abnormal   Collection Time: 11/29/23 11:42 PM  Result Value Ref Range   Sodium 139 135 - 145 mmol/L   Potassium 4.9 3.5 - 5.1 mmol/L   Chloride 103 98 - 111 mmol/L   CO2 25 22 - 32 mmol/L   Glucose, Bld 262 (H) 70 - 99 mg/dL    Comment: Glucose  reference range applies only to samples taken after fasting for at least 8 hours.   BUN 33 (H) 8 - 23 mg/dL   Creatinine, Ser 6.04 (H) 0.61 - 1.24 mg/dL   Calcium 9.3 8.9 - 54.0 mg/dL   Total Protein 6.6 6.5 - 8.1 g/dL   Albumin 3.1 (L) 3.5 - 5.0 g/dL   AST 43 (H) 15 - 41 U/L   ALT 51 (H) 0 - 44 U/L   Alkaline Phosphatase 89 38 - 126 U/L   Total Bilirubin 0.6 0.0 - 1.2 mg/dL   GFR, Estimated 37 (L) >60 mL/min    Comment: (NOTE) Calculated using the CKD-EPI Creatinine Equation (2021)    Anion gap 11 5 - 15    Comment: Performed at [facility], [address], [place], [place] [address]  TSH     Status: None   Collection Time: 11/29/23 11:42 PM  Result Value Ref Range   TSH 1.859 0.350 - 4.500 uIU/mL    Comment: Performed by a 3rd Generation assay with a functional sensitivity of <=0.01 uIU/mL. Performed at [facility], [address] [place], [place] [address]   Vitamin B12     Status: None   Collection Time: 11/30/23  3:59 AM  Result Value Ref Range   Vitamin B-12 585 180 - 914 pg/mL    Comment: (NOTE) This assay is not validated for testing neonatal or myeloproliferative syndrome specimens for Vitamin B12 levels. Performed at [facility], [address] [place], [place] [address]   CBC     Status: Abnormal   Collection Time: 11/30/23  4:00 AM  Result Value Ref Range   WBC 9.9 4.0 - 10.5 K/uL   RBC 2.84 (L) 4.22 - 5.81 MIL/uL   Hemoglobin 8.7 (L) 13.0 - 17.0 g/dL   HCT 30.8 (L) 65.7 - 84.6 %   MCV 94.0 80.0 - 100.0 fL   MCH 30.6 26.0 - 34.0 pg   MCHC 32.6 30.0 - 36.0 g/dL   RDW 96.2 95.2 - 84.1 %   Platelets 203 150 - 400 K/uL   nRBC 0.0 0.0 - 0.2 %    Comment: Performed at [facility], [address], [place], [place] [address]  Comprehensive metabolic panel     Status: Abnormal   Collection Time: 11/30/23  4:00 AM  Result Value Ref Range   Sodium 141 135 - 145 mmol/L   Potassium 4.6 3.5 - 5.1 mmol/L   Chloride 105 98 -  111 mmol/L    CO2 25 22 - 32 mmol/L   Glucose, Bld 154 (H) 70 - 99 mg/dL    Comment: Glucose reference range applies only to samples taken after fasting for at least 8 hours.   BUN 29 (H) 8 - 23 mg/dL   Creatinine, Ser 0.98 (H) 0.61 - 1.24 mg/dL   Calcium 9.0 8.9 - 11.9 mg/dL   Total Protein 6.1 (L) 6.5 - 8.1 g/dL   Albumin 2.9 (L) 3.5 - 5.0 g/dL   AST 43 (H) 15 - 41 U/L   ALT 45 (H) 0 - 44 U/L   Alkaline Phosphatase 90 38 - 126 U/L   Total Bilirubin 0.6 0.0 - 1.2 mg/dL   GFR, Estimated 46 (L) >60 mL/min    Comment: (NOTE) Calculated using the CKD-EPI Creatinine Equation (2021)    Anion gap 11 5 - 15    Comment: Performed at [facility], [address], [place], [place] [address]  APTT     Status: None   Collection Time: 11/30/23  4:00 AM  Result Value Ref Range   aPTT 25 24 - 36 seconds    Comment: Performed at [facility], [address] [place], [place] [address]  Protime-INR     Status: None   Collection Time: 11/30/23  4:00 AM  Result Value Ref Range   Prothrombin Time 15.1 11.4 - 15.2 seconds   INR 1.2 0.8 - 1.2    Comment: (NOTE) INR goal varies based on device and disease states. Performed at [facility], [address], [place], [place] [address]   Urinalysis, Routine w reflex microscopic -Urine, Catheterized     Status: Abnormal   Collection Time: 11/30/23  4:28 AM  Result Value Ref Range   Color, Urine YELLOW YELLOW   APPearance CLEAR CLEAR   Specific Gravity, Urine 1.012 1.005 - 1.030   pH 5.0 5.0 - 8.0   Glucose, UA >=500 (A) NEGATIVE mg/dL   Hgb urine dipstick SMALL (A) NEGATIVE   Bilirubin Urine NEGATIVE NEGATIVE   Ketones, ur NEGATIVE NEGATIVE mg/dL   Protein, ur NEGATIVE NEGATIVE mg/dL   Nitrite NEGATIVE NEGATIVE   Leukocytes,Ua NEGATIVE NEGATIVE   RBC / HPF 6-10 0 - 5 RBC/hpf   WBC, UA 0-5 0 - 5 WBC/hpf   Bacteria, UA NONE SEEN NONE SEEN   Squamous Epithelial / HPF 0-5 0 - 5 /HPF    Comment: Performed at [facility], [address] [place], [place] [address]  Creatinine, urine, random     Status: None   Collection Time: 11/30/23  4:28 AM  Result Value Ref Range   Creatinine, Urine 98 mg/dL    Comment: Performed at [facility], [address] [place], [place] [address]  Sodium, urine, random     Status: None   Collection Time: 11/30/23  4:28 AM  Result Value Ref Range   Sodium, Ur 73 mmol/L    Comment: Performed at [facility], [address], [place], [place] [address]  CBG monitoring, ED     Status: Abnormal   Collection Time: 11/30/23  6:21 AM  Result Value Ref Range   Glucose-Capillary 167 (H) 70 - 99 mg/dL    Comment: Glucose reference range applies only to samples taken after fasting for at least 8 hours.    CT Head Wo Contrast Result Date: 11/30/2023 CLINICAL DATA:  Follow-up head trauma EXAM: CT HEAD WITHOUT CONTRAST TECHNIQUE:  Contiguous axial images were obtained from the base of the skull through the vertex without intravenous contrast. RADIATION DOSE REDUCTION: This exam was performed according to the departmental dose-optimization program which includes automated exposure control, adjustment of the mA and/or kV according to patient size and/or use of iterative reconstruction technique. COMPARISON:  Head CT from the same day at 2 a.m. FINDINGS: Brain: Growing hematoma in the anterior left frontal lobe, hemorrhagic area measuring 7.3 x 5 x 3.4 cm. The hemorrhage has now decompressed into the left lateral ventricle with tracking to the level of the fourth ventricle. No hydrocephalus. Patchy subarachnoid hemorrhage along the frontal lobes and sylvian fissures which appears similar. The hemorrhage and edema causes progressive swelling with anterior midline shift measuring 5 mm. Chronic right occipital infarct. Low-density in the cerebral white matter from chronic small vessel ischemia. Vascular: No hyperdense vessel or unexpected calcification. Skull: No acute finding Sinuses/Orbits: No evidence of  injury Critical Value/emergent results were called by telephone at the time of interpretation on 11/30/2023 at 8:45 am to provider [place], who verbally acknowledged these results. IMPRESSION: Progressive left frontal hematoma now measuring 60 cc with new intraventricular extension reaching the fourth ventricle. Patchy bilateral subarachnoid hemorrhage appears similar to prior. The growing hematoma and edema causes anterior midline shift by 5 mm. No hydrocephalus. Electronically Signed   By: Tiburcio Pea M.D.   On: 11/30/2023 08:46   CT Head Wo Contrast Result Date: 11/30/2023 CLINICAL DATA:  Head trauma, minor (Age >= 65y); Neck trauma (Age >= 65y) EXAM: CT HEAD WITHOUT CONTRAST CT CERVICAL SPINE WITHOUT CONTRAST TECHNIQUE: Multidetector CT imaging of the head and cervical spine was performed following the standard protocol without intravenous contrast. Multiplanar CT image reconstructions of the cervical spine were also generated. RADIATION DOSE REDUCTION: This exam was performed according to the departmental dose-optimization program which includes automated exposure control, adjustment of the mA and/or kV according to patient size and/or use of iterative reconstruction technique. COMPARISON:  MRI head November 15, 2023. FINDINGS: CT HEAD FINDINGS Brain: Acute intraparenchymal hemorrhage in the left frontal lobe measuring 2.6 cm with overlying extra-axial extension. Small volume of multifocal subarachnoid hemorrhage bilaterally along the right frontal convexity, left frontal convexity and layering within the left sylvian fissure. No midline shift. No hydrocephalus. Remote right occipital infarct. Vascular: Calcific atherosclerosis.  No dense vessel. Skull: No acute fracture. Sinuses/Orbits: Clear sinuses.  No acute orbital findings. Other: No mastoid effusions. CT CERVICAL SPINE FINDINGS Alignment: No substantial sagittal subluxation. Skull base and vertebrae: No acute fracture. Soft tissues and spinal canal:  No prevertebral fluid or swelling. No visible canal hematoma. Disc levels:  Moderate multilevel degenerative change. Upper chest: Visualized lung apices are clear. IMPRESSION: 1. Acute intraparenchymal hemorrhage in left frontal lobe with multifocal bilateral small volume subarachnoid hemorrhage. No midline shift. 2. No acute fracture or traumatic malalignment in the cervical spine. Findings discussed with Dr. Clayborne Dana via telephone at 2:19 a.m. Electronically Signed   By: Feliberto Harts M.D.   On: 11/30/2023 02:21   CT Cervical Spine Wo Contrast Result Date: 11/30/2023 CLINICAL DATA:  Head trauma, minor (Age >= 65y); Neck trauma (Age >= 65y) EXAM: CT HEAD WITHOUT CONTRAST CT CERVICAL SPINE WITHOUT CONTRAST TECHNIQUE: Multidetector CT imaging of the head and cervical spine was performed following the standard protocol without intravenous contrast. Multiplanar CT image reconstructions of the cervical spine were also generated. RADIATION DOSE REDUCTION: This exam was performed according to the departmental dose-optimization program which includes automated exposure control, adjustment of  the mA and/or kV according to patient size and/or use of iterative reconstruction technique. COMPARISON:  MRI head November 15, 2023. FINDINGS: CT HEAD FINDINGS Brain: Acute intraparenchymal hemorrhage in the left frontal lobe measuring 2.6 cm with overlying extra-axial extension. Small volume of multifocal subarachnoid hemorrhage bilaterally along the right frontal convexity, left frontal convexity and layering within the left sylvian fissure. No midline shift. No hydrocephalus. Remote right occipital infarct. Vascular: Calcific atherosclerosis.  No dense vessel. Skull: No acute fracture. Sinuses/Orbits: Clear sinuses.  No acute orbital findings. Other: No mastoid effusions. CT CERVICAL SPINE FINDINGS Alignment: No substantial sagittal subluxation. Skull base and vertebrae: No acute fracture. Soft tissues and spinal canal: No  prevertebral fluid or swelling. No visible canal hematoma. Disc levels:  Moderate multilevel degenerative change. Upper chest: Visualized lung apices are clear. IMPRESSION: 1. Acute intraparenchymal hemorrhage in left frontal lobe with multifocal bilateral small volume subarachnoid hemorrhage. No midline shift. 2. No acute fracture or traumatic malalignment in the cervical spine. Findings discussed with Dr. Clayborne Dana via telephone at 2:19 a.m. Electronically Signed   By: Feliberto Harts M.D.   On: 11/30/2023 02:21   DG Chest Portable 1 View Result Date: 11/30/2023 CLINICAL DATA:  Fall EXAM: PORTABLE CHEST 1 VIEW COMPARISON:  03/05/2012 FINDINGS: There is asymmetric airspace infiltrate within the right mid lung zone, possibly infectious or posttraumatic given the history of recent trauma. No pneumothorax or pleural effusion. Cardiac size within normal limits. Pulmonary vascularity is normal. No acute bone abnormality. IMPRESSION: 1. Asymmetric airspace infiltrate within the right mid lung zone, possibly infectious or posttraumatic given the history of recent trauma. Electronically Signed   By: Helyn Numbers M.D.   On: 11/30/2023 01:18   DG Pelvis Portable Result Date: 11/30/2023 CLINICAL DATA:  Fall EXAM: PORTABLE PELVIS 1-2 VIEWS COMPARISON:  None Available. FINDINGS: Left total hip arthroplasty has been performed. No acute fracture or dislocation. Mild right hip degenerative arthritis. Vascular calcifications noted. Soft tissues are otherwise unremarkable. IMPRESSION: 1. Left total hip arthroplasty. No acute fracture or dislocation. Electronically Signed   By: Helyn Numbers M.D.   On: 11/30/2023 01:17    Review of Systems  Unable to perform ROS: Acuity of condition   Blood pressure 111/66, pulse 81, temperature 97.7 F (36.5 C), temperature source Oral, resp. rate 20, SpO2 98%. Physical Exam Constitutional:      Appearance: Normal appearance.  HENT:     Head: Normocephalic.     Comments: Small  laceration over the left orbital rim which has been closed with Steri-Strips.  (1 cm.  )    Right Ear: Tympanic membrane, ear canal and external ear normal.     Left Ear: Tympanic membrane, ear canal and external ear normal.  Eyes:     Comments: Pupils 2 mm and irregularly shaped ? postsurgical.  Right pupil is 3 mm and also irregularly shaped.  They are briskly reactive.  Neurological:     Mental Status: He is alert.     Comments: The patient is nonverbal currently.  He does move his upper extremities peacefully and appeared to follow some commands in the left upper extremity.  He moves his left lower extremity spontaneously.  He opens his eyes briskly when I talk to otherwise remains with his eyes closed.     Assessment/Plan: Left frontal intracerebral traumatic hematoma.  Traumatic subarachnoid hemorrhages.  Plan: Conservative management for now with reevaluation of CT scan in 24 hours.  Patient will be monitored in the neuro ICU.  Sherilyn Cooter  J Chea Malan 11/30/2023, 10:07 AM

## 2023-11-30 NOTE — ED Provider Notes (Addendum)
[place] EMERGENCY DEPARTMENT AT [facility] Provider Note   CSN: [id number] Arrival date & time: 11/29/23  2330     History  Chief Complaint  Patient presents with   Marletta Lor    William Todd is a 73 y.o. male.  73 year old male history of dementia recent arthroplasty to septic presents ER today secondary to aromas fall.  Small laceration to his cheek.  Presents here for further evaluation from his rehabilitation facility.  Mental status is apparently at baseline but poor historian secondary to dementia.  Does not remember the fall.  Describes no pain at this time.   Fall  Home Medications Prior to Admission medications   Medication Sig Start Date End Date Taking? Authorizing Provider  acetaminophen (TYLENOL) 325 MG tablet Take 3 tablets (975 mg total) by mouth every 8 (eight) hours for 5 days, THEN 3 tablets (975 mg total) every 8 (eight) hours as needed for up to 25 days. 11/19/23 12/19/23 Yes Almon Hercules, MD  amLODipine (NORVASC) 5 MG tablet Take 1 tablet (5 mg total) by mouth daily. 11/19/23  Yes Almon Hercules, MD  aspirin EC 81 MG tablet Take 1 tablet (81 mg total) by mouth in the morning and at bedtime for 28 days, THEN 1 tablet (81 mg total) daily. 11/19/23 03/16/24 Yes Almon Hercules, MD  atorvastatin (LIPITOR) 80 MG tablet Take 0.5 tablets (40 mg total) by mouth daily at 6 PM. 01/03/13  Yes Rinaldo Cloud, MD  famotidine (PEPCID) 20 MG tablet Take 1 tablet (20 mg total) by mouth 2 (two) times daily. Patient taking differently: Take 20 mg by mouth daily. 01/03/13  Yes Rinaldo Cloud, MD  metoprolol succinate (TOPROL-XL) 50 MG 24 hr tablet Take 1 tablet (50 mg total) by mouth daily. Take with or immediately following a meal. 01/03/13  Yes Rinaldo Cloud, MD  Nutritional Supplements (GLUCERNA CARBSTEADY) LIQD Take 237 mLs by mouth 2 (two) times daily after a meal. Patient taking differently: Take 237 mLs by mouth 3 (three) times daily with meals. 11/20/23  Yes Almon Hercules, MD   sitaGLIPtin (JANUVIA) 50 MG tablet Take 50 mg by mouth daily.   Yes [provider]  glimepiride (AMARYL) 4 MG tablet  08/30/13   [provider]  nitroGLYCERIN (NITROSTAT) 0.4 MG SL tablet Place 1 tablet (0.4 mg total) under the tongue every 5 (five) minutes x 3 doses as needed for chest pain. Patient not taking: Reported on 11/30/2023 01/03/13   Rinaldo Cloud, MD  polyethylene glycol powder (MIRALAX) 17 GM/SCOOP powder Take 17 g by mouth 2 (two) times daily as needed for moderate constipation. Patient not taking: Reported on 11/30/2023 11/19/23   Almon Hercules, MD  senna-docusate (SENOKOT-S) 8.6-50 MG tablet Take 1 tablet by mouth 2 (two) times daily between meals as needed for mild constipation. Patient not taking: Reported on 11/30/2023 11/19/23   Almon Hercules, MD      Allergies    Patient has no known allergies.    Review of Systems   Review of Systems  Physical Exam Updated Vital Signs BP 107/67   Pulse 80   Temp 99.2 F (37.3 C) (Rectal)   Resp 19   SpO2 98%  Physical Exam Vitals and nursing note reviewed.  Constitutional:      Appearance: He is well-developed.  HENT:     Head: Normocephalic.     Comments: Small superficial laceration/abrasion underneath his left eyelid.  Associated ecchymosis as well. Cardiovascular:  Rate and Rhythm: Normal rate.  Pulmonary:     Effort: Pulmonary effort is normal. No respiratory distress.  Abdominal:     General: There is no distension.  Musculoskeletal:        General: Normal range of motion.     Cervical back: Normal range of motion.  Neurological:     Mental Status: He is alert.     ED Results / Procedures / Treatments   Labs (all labs ordered are listed, but only abnormal results are displayed) Labs Reviewed  CBC WITH DIFFERENTIAL/PLATELET - Abnormal; Notable for the following components:      Result Value   RBC 3.17 (*)    Hemoglobin 9.6 (*)    HCT 30.0 (*)    Neutro Abs 8.9 (*)    Abs Immature  Granulocytes 0.21 (*)    All other components within normal limits  COMPREHENSIVE METABOLIC PANEL - Abnormal; Notable for the following components:   Glucose, Bld 262 (*)    BUN 33 (*)    Creatinine, Ser 1.87 (*)    Albumin 3.1 (*)    AST 43 (*)    ALT 51 (*)    GFR, Estimated 37 (*)    All other components within normal limits  URINALYSIS, ROUTINE W REFLEX MICROSCOPIC - Abnormal; Notable for the following components:   Glucose, UA >=500 (*)    Hgb urine dipstick SMALL (*)    All other components within normal limits  CBC - Abnormal; Notable for the following components:   RBC 2.84 (*)    Hemoglobin 8.7 (*)    HCT 26.7 (*)    All other components within normal limits  COMPREHENSIVE METABOLIC PANEL - Abnormal; Notable for the following components:   Glucose, Bld 154 (*)    BUN 29 (*)    Creatinine, Ser 1.57 (*)    Total Protein 6.1 (*)    Albumin 2.9 (*)    AST 43 (*)    ALT 45 (*)    GFR, Estimated 46 (*)    All other components within normal limits  APTT  PROTIME-INR  TSH  CREATININE, URINE, RANDOM  SODIUM, URINE, RANDOM  AMMONIA  VITAMIN B12  RPR    EKG EKG Interpretation Date/Time:  Saturday November 29 2023 23:40:39 EST Ventricular Rate:  85 PR Interval:  137 QRS Duration:  115 QT Interval:  379 QTC Calculation: 451 R Axis:   72  Text Interpretation: Sinus rhythm Nonspecific intraventricular conduction delay Minimal ST depression, lateral leads Borderline ST elevation, anterior leads Baseline wander in lead(s) V6 Confirmed by Marily Memos [phone]) on 11/30/2023 12:06:49 AM  Radiology CT Head Wo Contrast Result Date: 11/30/2023 CLINICAL DATA:  Head trauma, minor (Age >= 65y); Neck trauma (Age >= 65y) EXAM: CT HEAD WITHOUT CONTRAST CT CERVICAL SPINE WITHOUT CONTRAST TECHNIQUE: Multidetector CT imaging of the head and cervical spine was performed following the standard protocol without intravenous contrast. Multiplanar CT image reconstructions of the cervical  spine were also generated. RADIATION DOSE REDUCTION: This exam was performed according to the departmental dose-optimization program which includes automated exposure control, adjustment of the mA and/or kV according to patient size and/or use of iterative reconstruction technique. COMPARISON:  MRI head November 15, 2023. FINDINGS: CT HEAD FINDINGS Brain: Acute intraparenchymal hemorrhage in the left frontal lobe measuring 2.6 cm with overlying extra-axial extension. Small volume of multifocal subarachnoid hemorrhage bilaterally along the right frontal convexity, left frontal convexity and layering within the left sylvian fissure. No midline shift. No  hydrocephalus. Remote right occipital infarct. Vascular: Calcific atherosclerosis.  No dense vessel. Skull: No acute fracture. Sinuses/Orbits: Clear sinuses.  No acute orbital findings. Other: No mastoid effusions. CT CERVICAL SPINE FINDINGS Alignment: No substantial sagittal subluxation. Skull base and vertebrae: No acute fracture. Soft tissues and spinal canal: No prevertebral fluid or swelling. No visible canal hematoma. Disc levels:  Moderate multilevel degenerative change. Upper chest: Visualized lung apices are clear. IMPRESSION: 1. Acute intraparenchymal hemorrhage in left frontal lobe with multifocal bilateral small volume subarachnoid hemorrhage. No midline shift. 2. No acute fracture or traumatic malalignment in the cervical spine. Findings discussed with Dr. Clayborne Dana via telephone at 2:19 a.m. Electronically Signed   By: Feliberto Harts M.D.   On: 11/30/2023 02:21   CT Cervical Spine Wo Contrast Result Date: 11/30/2023 CLINICAL DATA:  Head trauma, minor (Age >= 65y); Neck trauma (Age >= 65y) EXAM: CT HEAD WITHOUT CONTRAST CT CERVICAL SPINE WITHOUT CONTRAST TECHNIQUE: Multidetector CT imaging of the head and cervical spine was performed following the standard protocol without intravenous contrast. Multiplanar CT image reconstructions of the cervical spine  were also generated. RADIATION DOSE REDUCTION: This exam was performed according to the departmental dose-optimization program which includes automated exposure control, adjustment of the mA and/or kV according to patient size and/or use of iterative reconstruction technique. COMPARISON:  MRI head November 15, 2023. FINDINGS: CT HEAD FINDINGS Brain: Acute intraparenchymal hemorrhage in the left frontal lobe measuring 2.6 cm with overlying extra-axial extension. Small volume of multifocal subarachnoid hemorrhage bilaterally along the right frontal convexity, left frontal convexity and layering within the left sylvian fissure. No midline shift. No hydrocephalus. Remote right occipital infarct. Vascular: Calcific atherosclerosis.  No dense vessel. Skull: No acute fracture. Sinuses/Orbits: Clear sinuses.  No acute orbital findings. Other: No mastoid effusions. CT CERVICAL SPINE FINDINGS Alignment: No substantial sagittal subluxation. Skull base and vertebrae: No acute fracture. Soft tissues and spinal canal: No prevertebral fluid or swelling. No visible canal hematoma. Disc levels:  Moderate multilevel degenerative change. Upper chest: Visualized lung apices are clear. IMPRESSION: 1. Acute intraparenchymal hemorrhage in left frontal lobe with multifocal bilateral small volume subarachnoid hemorrhage. No midline shift. 2. No acute fracture or traumatic malalignment in the cervical spine. Findings discussed with Dr. Clayborne Dana via telephone at 2:19 a.m. Electronically Signed   By: Feliberto Harts M.D.   On: 11/30/2023 02:21   DG Chest Portable 1 View Result Date: 11/30/2023 CLINICAL DATA:  Fall EXAM: PORTABLE CHEST 1 VIEW COMPARISON:  03/05/2012 FINDINGS: There is asymmetric airspace infiltrate within the right mid lung zone, possibly infectious or posttraumatic given the history of recent trauma. No pneumothorax or pleural effusion. Cardiac size within normal limits. Pulmonary vascularity is normal. No acute bone  abnormality. IMPRESSION: 1. Asymmetric airspace infiltrate within the right mid lung zone, possibly infectious or posttraumatic given the history of recent trauma. Electronically Signed   By: Helyn Numbers M.D.   On: 11/30/2023 01:18   DG Pelvis Portable Result Date: 11/30/2023 CLINICAL DATA:  Fall EXAM: PORTABLE PELVIS 1-2 VIEWS COMPARISON:  None Available. FINDINGS: Left total hip arthroplasty has been performed. No acute fracture or dislocation. Mild right hip degenerative arthritis. Vascular calcifications noted. Soft tissues are otherwise unremarkable. IMPRESSION: 1. Left total hip arthroplasty. No acute fracture or dislocation. Electronically Signed   By: Helyn Numbers M.D.   On: 11/30/2023 01:17    Procedures .Critical Care  Performed by: Marily Memos, MD Authorized by: Marily Memos, MD   Critical care provider statement:  Critical care time (minutes):  30   Critical care was necessary to treat or prevent imminent or life-threatening deterioration of the following conditions:  Trauma and CNS failure or compromise   Critical care was time spent personally by me on the following activities:  Development of treatment plan with patient or surrogate, discussions with consultants, evaluation of patient's response to treatment, examination of patient, ordering and review of laboratory studies, ordering and review of radiographic studies, ordering and performing treatments and interventions, pulse oximetry, re-evaluation of patient's condition and review of old charts     Medications Ordered in ED Medications  sodium chloride flush (NS) 0.9 % injection 3 mL (3 mLs Intravenous Given 11/30/23 0428)  ondansetron (ZOFRAN) injection 4 mg (4 mg Intravenous Given 11/30/23 0412)  LORazepam (ATIVAN) injection 2 mg (has no administration in time range)  lactated ringers infusion ( Intravenous New Bag/Given 11/30/23 0418)  acetaminophen (TYLENOL) suppository 650 mg (has no administration in time  range)    ED Course/ Medical Decision Making/ A&P                                 Medical Decision Making Amount and/or Complexity of Data Reviewed Labs: ordered. Radiology: ordered.  Risk Decision regarding hospitalization.   Poor historian so CT scans done to evaluate for any injuries due to the fall.  Nursing for wound care and Steri-Strips of this superficial laceration.  CT scan viewed interpreted by myself with obvious intraparenchymal bleed along with subarachnoid bleed.  Discussed with Dr. Danielle Dess with neurosurgery and plan to recheck a CT scan at 0800.  And he will evaluate this morning for further recommendations.  No other interventions at this time.  D/w Dr. Janalyn Shy for admit.   Final Clinical Impression(s) / ED Diagnoses Final diagnoses:  Intraparenchymal hemorrhage of brain (HCC)  Subarachnoid bleed (HCC)  Fall, initial encounter    Rx / DC Orders ED Discharge Orders     None         Nikash Mortensen, Barbara Cower, MD 11/30/23 1610    Marily Memos, MD 11/30/23 [phone]

## 2023-11-30 NOTE — ED Notes (Signed)
This RN attempted to perform NIH on the pt. Pt not following commands like he was on arrival. Pt is also resisting against staff and moving all extremities. Dr. Clayborne Dana made aware.

## 2023-11-30 NOTE — Progress Notes (Signed)
  Echocardiogram 2D Echocardiogram has been performed.  William Todd 11/30/2023, 5:39 PM

## 2023-11-30 NOTE — ED Notes (Signed)
Left eye laceration cleaned and steri strips applied.

## 2023-11-30 NOTE — Progress Notes (Signed)
Updated neurosurgery and CCM team of improvements in patients communication and NIH score.  Patient remains still aphasic, with simple phrases able to be communicated. Patient resting comfortably in the bed. Follow up CT planned for tomorrow morning at 8AM.

## 2023-11-30 NOTE — Progress Notes (Signed)
Contacted E-link for clarification of blood pressure goals, as initial CCM consult note says SBP 130-150, PRN order is less than 160, and another order is SBP goal 110-140.

## 2023-11-30 NOTE — ED Notes (Signed)
 Patient transported to CT

## 2023-11-30 NOTE — Progress Notes (Signed)
SLP Cancellation Note  Patient Details Name: William Todd MRN: [id number] DOB: 03-Nov-1950   Cancelled treatment:       Reason Eval/Treat Not Completed: Medical issues which prohibited therapy. Per RN, patient with neuro changes in ED early this am, resting at this time. Holding [place] until next date. Requested that SLP f/u tomorrow.   Ferdinand Lango MA, CCC-SLP    [name] [name] 11/30/2023, 1:29 PM

## 2023-11-30 NOTE — Progress Notes (Signed)
eLink Physician-Brief Progress Note Patient Name: William Todd DOB: 11-Jan-1951 MRN: [id number]   Date of Service  11/30/2023  HPI/Events of Note  RN wants clarification of blood pressure goals.  eICU Interventions  Patient's chart reviewed.  Blood pressure goal is still 130s to 150 mmHg systolic.  Bedside RN informed.     Intervention Category Evaluation Type: Other  Carilyn Goodpasture 11/30/2023, 8:35 PM

## 2023-11-30 NOTE — ED Notes (Signed)
Hospitalist [place] paged about patients neurologic change and change in GCS.

## 2023-11-30 NOTE — TOC CAGE-AID Note (Signed)
Transition of Care [facility]) - CAGE-AID Screening   Patient Details  Name: William Todd MRN: [id number] Date of Birth: 03-17-1951  [facility]) CM/SW Contact:    Judie Bonus, RN Phone Number: 11/30/2023, 6:33 AM   Clinical Narrative:  GCS 13, unable to participate in survey. No hx of abuse, pt resides at [facility], admitted 2/4 for fall-fem neck fx, presents today after fall, will be admitted for ICH by [facility]  CAGE-AID Screening: Substance Abuse Screening unable to be completed due to: : Patient unable to participate

## 2023-11-30 NOTE — Plan of Care (Signed)
  Problem: Tissue Perfusion: Goal: Adequacy of tissue perfusion will improve Outcome: Progressing   Problem: Pain Managment: Goal: General experience of comfort will improve and/or be controlled Outcome: Progressing   Problem: Intracerebral Hemorrhage Tissue Perfusion: Goal: Complications of Intracerebral Hemorrhage will be minimized Outcome: Progressing   Problem: Coping: Goal: Will verbalize positive feelings about self Outcome: Progressing   Problem: Self-Care: Goal: Ability to communicate needs accurately will improve Outcome: Progressing   Problem: Self-Care: Goal: Verbalization of feelings and concerns over difficulty with self-care will improve Outcome: Progressing

## 2023-11-30 NOTE — Progress Notes (Signed)
Spoke with Elink MD. SBP goal 130-150. RN was told to only treat if greater than 160, however.

## 2023-11-30 NOTE — Progress Notes (Signed)
Patient ID: William Todd, male   DOB: May 11, 1951, 73 y.o.   MRN: [id number] The patient seen this afternoon and I note he arouses to voice.  He will offer 1 or 2 words.  He is moving all 4 extremities.  Continue to follow clinically.

## 2023-12-01 ENCOUNTER — Inpatient Hospital Stay (HOSPITAL_COMMUNITY): Payer: Medicare HMO

## 2023-12-01 DIAGNOSIS — S06350A Traumatic hemorrhage of left cerebrum without loss of consciousness, initial encounter: Secondary | ICD-10-CM

## 2023-12-01 DIAGNOSIS — I10 Essential (primary) hypertension: Secondary | ICD-10-CM

## 2023-12-01 DIAGNOSIS — I609 Nontraumatic subarachnoid hemorrhage, unspecified: Secondary | ICD-10-CM | POA: Diagnosis not present

## 2023-12-01 DIAGNOSIS — E43 Unspecified severe protein-calorie malnutrition: Secondary | ICD-10-CM | POA: Insufficient documentation

## 2023-12-01 DIAGNOSIS — E785 Hyperlipidemia, unspecified: Secondary | ICD-10-CM

## 2023-12-01 DIAGNOSIS — N1832 Chronic kidney disease, stage 3b: Secondary | ICD-10-CM | POA: Diagnosis not present

## 2023-12-01 DIAGNOSIS — I619 Nontraumatic intracerebral hemorrhage, unspecified: Secondary | ICD-10-CM | POA: Diagnosis not present

## 2023-12-01 DIAGNOSIS — N179 Acute kidney failure, unspecified: Secondary | ICD-10-CM | POA: Diagnosis not present

## 2023-12-01 LAB — CBC
HCT: 25 % — ABNORMAL LOW (ref 39.0–52.0)
Hemoglobin: 8 g/dL — ABNORMAL LOW (ref 13.0–17.0)
MCH: 29.9 pg (ref 26.0–34.0)
MCHC: 32 g/dL (ref 30.0–36.0)
MCV: 93.3 fL (ref 80.0–100.0)
Platelets: 154 10*3/uL (ref 150–400)
RBC: 2.68 MIL/uL — ABNORMAL LOW (ref 4.22–5.81)
RDW: 14.1 % (ref 11.5–15.5)
WBC: 8.6 10*3/uL (ref 4.0–10.5)
nRBC: 0 % (ref 0.0–0.2)

## 2023-12-01 LAB — GLUCOSE, CAPILLARY
Glucose-Capillary: 136 mg/dL — ABNORMAL HIGH (ref 70–99)
Glucose-Capillary: 142 mg/dL — ABNORMAL HIGH (ref 70–99)
Glucose-Capillary: 143 mg/dL — ABNORMAL HIGH (ref 70–99)
Glucose-Capillary: 162 mg/dL — ABNORMAL HIGH (ref 70–99)
Glucose-Capillary: 172 mg/dL — ABNORMAL HIGH (ref 70–99)
Glucose-Capillary: 178 mg/dL — ABNORMAL HIGH (ref 70–99)

## 2023-12-01 LAB — URINALYSIS, ROUTINE W REFLEX MICROSCOPIC: RBC / HPF: >50 RBC/hpf (ref 0–5)

## 2023-12-01 LAB — BASIC METABOLIC PANEL
Anion gap: 10 (ref 5–15)
BUN: 18 mg/dL (ref 8–23)
CO2: 26 mmol/L (ref 22–32)
Calcium: 9.2 mg/dL (ref 8.9–10.3)
Chloride: 104 mmol/L (ref 98–111)
Creatinine, Ser: 1.29 mg/dL — ABNORMAL HIGH (ref 0.61–1.24)
GFR, Estimated: 59 mL/min — ABNORMAL LOW (ref >60)
Glucose, Bld: 141 mg/dL — ABNORMAL HIGH (ref 70–99)
Potassium: 4.3 mmol/L (ref 3.5–5.1)
Sodium: 140 mmol/L (ref 135–145)

## 2023-12-01 LAB — PHOSPHORUS: Phosphorus: 3.7 mg/dL (ref 2.5–4.6)

## 2023-12-01 LAB — MAGNESIUM: Magnesium: 1.7 mg/dL (ref 1.7–2.4)

## 2023-12-01 MED ORDER — VITAL HIGH PROTEIN PO LIQD
1000.0000 mL | ORAL | Status: DC
  Filled 2023-12-01: qty 1000

## 2023-12-01 MED ORDER — METOPROLOL TARTRATE 5 MG/5ML IV SOLN
2.5000 mg | Freq: Four times a day (QID) | INTRAVENOUS | Status: DC
Start: 2023-12-01 — End: 2023-12-03
  Administered 2023-12-01 – 2023-12-03 (×8): 2.5 mg via INTRAVENOUS
  Filled 2023-12-01 (×7): qty 5

## 2023-12-01 MED ORDER — LABETALOL HCL 5 MG/ML IV SOLN
10.0000 mg | INTRAVENOUS | Status: DC | PRN
  Administered 2023-12-01: 10 mg via INTRAVENOUS
  Filled 2023-12-01: qty 4

## 2023-12-01 MED ORDER — PROSOURCE TF20 ENFIT COMPATIBL EN LIQD
60.0000 mL | Freq: Every day | ENTERAL | Status: DC
  Administered 2023-12-01 – 2024-01-01 (×28): 60 mL
  Filled 2023-12-01 (×28): qty 60

## 2023-12-01 MED ORDER — HYDRALAZINE HCL 20 MG/ML IJ SOLN: 5.0000 mg | INTRAMUSCULAR | Status: DC | PRN

## 2023-12-01 MED ORDER — AMLODIPINE BESYLATE 10 MG PO TABS
10.0000 mg | ORAL_TABLET | Freq: Every day | ORAL | Status: DC
  Administered 2023-12-03 – 2023-12-07 (×5): 10 mg
  Filled 2023-12-01 (×5): qty 1

## 2023-12-01 MED ORDER — ATORVASTATIN CALCIUM 40 MG PO TABS: 40.0000 mg | ORAL_TABLET | Freq: Every day | ORAL | Status: DC

## 2023-12-01 MED ORDER — ADULT MULTIVITAMIN W/MINERALS CH
1.0000 | ORAL_TABLET | Freq: Every day | ORAL | Status: DC
  Administered 2023-12-01 – 2024-01-01 (×29): 1
  Filled 2023-12-01 (×29): qty 1

## 2023-12-01 MED ORDER — OSMOLITE 1.5 CAL PO LIQD
1000.0000 mL | ORAL | Status: DC
  Administered 2023-12-01 – 2024-01-01 (×31): 1000 mL
  Filled 2023-12-01 (×12): qty 1000

## 2023-12-01 MED ORDER — AMLODIPINE BESYLATE 5 MG PO TABS
5.0000 mg | ORAL_TABLET | Freq: Every day | ORAL | Status: DC
  Administered 2023-12-01: 5 mg
  Filled 2023-12-01: qty 1

## 2023-12-01 MED ORDER — THIAMINE MONONITRATE 100 MG PO TABS
100.0000 mg | ORAL_TABLET | Freq: Every day | ORAL | Status: AC
  Administered 2023-12-03 – 2023-12-06 (×4): 100 mg
  Filled 2023-12-01 (×4): qty 1

## 2023-12-01 MED ORDER — SENNA 8.6 MG PO TABS
1.0000 | ORAL_TABLET | Freq: Two times a day (BID) | ORAL | Status: DC
  Administered 2023-12-01 – 2023-12-14 (×24): 8.6 mg
  Filled 2023-12-01 (×24): qty 1

## 2023-12-01 MED ORDER — ATORVASTATIN CALCIUM 40 MG PO TABS
40.0000 mg | ORAL_TABLET | Freq: Every day | ORAL | Status: DC
  Administered 2023-12-01: 40 mg
  Filled 2023-12-01: qty 1

## 2023-12-01 MED ORDER — LEVETIRACETAM IN NACL 500 MG/100ML IV SOLN
500.0000 mg | Freq: Two times a day (BID) | INTRAVENOUS | Status: DC
  Administered 2023-12-01 – 2023-12-03 (×5): 500 mg via INTRAVENOUS
  Filled 2023-12-01 (×5): qty 100

## 2023-12-01 MED ORDER — AMLODIPINE BESYLATE 5 MG PO TABS
5.0000 mg | ORAL_TABLET | Freq: Every day | ORAL | Status: DC
  Filled 2023-12-01: qty 1

## 2023-12-01 MED ORDER — LACTATED RINGERS IV BOLUS
1000.0000 mL | Freq: Once | INTRAVENOUS | Status: AC
  Administered 2023-12-01: 1000 mL via INTRAVENOUS

## 2023-12-01 MED ORDER — METOPROLOL TARTRATE 25 MG PO TABS
25.0000 mg | ORAL_TABLET | Freq: Two times a day (BID) | ORAL | Status: DC
  Administered 2023-12-01: 25 mg
  Filled 2023-12-01: qty 1

## 2023-12-01 MED ORDER — METOPROLOL SUCCINATE ER 50 MG PO TB24
50.0000 mg | ORAL_TABLET | Freq: Every day | ORAL | Status: DC
  Filled 2023-12-01: qty 1

## 2023-12-01 NOTE — Consult Note (Addendum)
Stroke Neurology Consultation Note  Consult Requested by: Dr Danielle Dess  Reason for Consult: ICH  Consult Date: 12/01/23   The history was obtained from the Dr. Danielle Dess and RN.  During history and examination, all items were able to obtain unless otherwise noted.  History of Present Illness:  William Todd is a 73 y.o. African American male with PMH of DM, HTN, HLD, prostate cancer, left foot fracture with prosthesis and dementia who was initially admitted on 11/15/23 for fall 2 weeks prior and difficulty ambulating. Wife could not take care of him at home and sent to ER for evaluation. X-ray showed acute transverse fracture of the left femoral neck with varus angulation of the fracture fragments. Orthopedics on board and underwent hemiarthroplasty on 2/2. Post op he had some anemia, AKI and also delirium/confusion. MRI at that time no acute finding, no stroke or hemorrhage. A1c 6.4. No sign of CAA. He was put on ASA 81 bid for 4 weeks for DVT prophylaxis. He was discharged to SNF on 11/20/23. He was readmitted on 2/15 with fall at SNF, landing on the left side with left eyebrow laceration, but no LOC reported. Pt complained of left sidede LE pain since. CT head showed left frontal small ICH with bilateral small SAH, concerning for traumatic ICH with contusion. However repeat CT 2/16 showed significant enlarged hematoma on the left frontal area with multi-lobe configuration and IVH and SAH but no hydro with 5mm MLS. Dr. Danielle Dess consulted. CT repeat today showed stable hematoma, IVH and SAH as well MLS. EF 60-65%. Per RN, pt seems more awake than yesterday, able to say "Yes""OK" and follow 1-2 simple commands, still move extremities symmetrically. Neurology consulted for possible spontaneous lobar hemorrhage.    Past Medical History:  Diagnosis Date   Anxiety    new dx   Chronic kidney disease    Diabetes mellitus    type ii   History of radiation therapy 09/29/13- 11/25/13   prostate 7800 cGy 40  sessions, seminal vesicles 5600 cGy 40 sessions   Hypertension    Prostate cancer (HCC) 06/01/2013   gleason 7    Past Surgical History:  Procedure Laterality Date   CARDIAC CATHETERIZATION  01/01/2013   FRACTURE SURGERY     left foot    HIP ARTHROPLASTY Left 11/16/2023   Procedure: ARTHROPLASTY BIPOLAR HIP (HEMIARTHROPLASTY);  Surgeon: Sheral Apley, MD;  Location: [facility] OR;  Service: Orthopedics;  Laterality: Left;   LEFT HEART CATHETERIZATION WITH CORONARY ANGIOGRAM N/A 01/01/2013   Procedure: LEFT HEART CATHETERIZATION WITH CORONARY ANGIOGRAM;  Surgeon: Robynn Pane, MD;  Location: [facility] CATH LAB;  Service: Cardiovascular;  Laterality: N/A;   PROSTATE BIOPSY  06/01/13    Family History  Problem Relation Age of Onset   Prostate cancer Brother 1       seed implant    Social History:  reports that he has never smoked. He has never used smokeless tobacco. He reports that he does not drink alcohol and does not use drugs.  Allergies: No Known Allergies  No current facility-administered medications on file prior to encounter.   Current Outpatient Medications on File Prior to Encounter  Medication Sig Dispense Refill   acetaminophen (TYLENOL) 325 MG tablet Take 3 tablets (975 mg total) by mouth every 8 (eight) hours for 5 days, THEN 3 tablets (975 mg total) every 8 (eight) hours as needed for up to 25 days.     amLODipine (NORVASC) 5 MG tablet Take 1 tablet (5 mg  total) by mouth daily.     aspirin EC 81 MG tablet Take 1 tablet (81 mg total) by mouth in the morning and at bedtime for 28 days, THEN 1 tablet (81 mg total) daily.     atorvastatin (LIPITOR) 80 MG tablet Take 0.5 tablets (40 mg total) by mouth daily at 6 PM. 3 tablet 30   famotidine (PEPCID) 20 MG tablet Take 1 tablet (20 mg total) by mouth 2 (two) times daily. (Patient taking differently: Take 20 mg by mouth daily.) 60 tablet 3   metoprolol succinate (TOPROL-XL) 50 MG 24 hr tablet Take 1 tablet (50 mg total) by mouth daily.  Take with or immediately following a meal. 30 tablet 3   Nutritional Supplements (GLUCERNA CARBSTEADY) LIQD Take 237 mLs by mouth 2 (two) times daily after a meal. (Patient taking differently: Take 237 mLs by mouth 3 (three) times daily with meals.)     sitaGLIPtin (JANUVIA) 50 MG tablet Take 50 mg by mouth daily.     glimepiride (AMARYL) 4 MG tablet  (Patient not taking: Reported on 11/30/2023)     nitroGLYCERIN (NITROSTAT) 0.4 MG SL tablet Place 1 tablet (0.4 mg total) under the tongue every 5 (five) minutes x 3 doses as needed for chest pain. (Patient not taking: Reported on 11/30/2023) 25 tablet 3   polyethylene glycol powder (MIRALAX) 17 GM/SCOOP powder Take 17 g by mouth 2 (two) times daily as needed for moderate constipation. (Patient not taking: Reported on 11/30/2023)     senna-docusate (SENOKOT-S) 8.6-50 MG tablet Take 1 tablet by mouth 2 (two) times daily between meals as needed for mild constipation. (Patient not taking: Reported on 11/30/2023)      Review of Systems: A full ROS was attempted today and was not able to be performed due to aphasia.   Physical Examination: Temp:  [98.2 F (36.8 C)-100.7 F (38.2 C)] 99.2 F (37.3 C) (02/17 0700) Pulse Rate:  [75-104] 90 (02/17 1300) Resp:  [14-29] 24 (02/17 1300) BP: (107-155)/(52-119) 136/71 (02/17 1300) SpO2:  [92 %-100 %] 99 % (02/17 1300)  General - well nourished, well developed, in no apparent distress.    Ophthalmologic - fundi not visualized due to noncooperation.    Cardiovascular - regular rhythm and rate  Neuro - awake, eyes open, answered with "OK" "yes" to all questions. Seems able to close eyes or show two fingers on the right hand but not following other commands and perseverated on eye closure and two fingers. Left gaze preference and right gaze incomplete but able to cross midline. No significant facial droop. Tongue protrusion or mouth open not cooperative. Bilateral UEs 4/5, no drift and slight b/l elbow flexion  posturing. Bilaterally LEs 3-/5 at least and able to flex and extend knees bilaterally symmetrically. Sensation, coordination and gait not tested.  Data Reviewed: CT HEAD WO CONTRAST ( ) Result Date: 12/01/2023 CLINICAL DATA:  Follow-up of intracranial hemorrhage. EXAM: CT HEAD WITHOUT CONTRAST TECHNIQUE: Contiguous axial images were obtained from the base of the skull through the vertex without intravenous contrast. RADIATION DOSE REDUCTION: This exam was performed according to the departmental dose-optimization program which includes automated exposure control, adjustment of the mA and/or kV according to patient size and/or use of iterative reconstruction technique. COMPARISON:  None Available. FINDINGS: Brain: Redemonstrated intraparenchymal hematoma in the anterior left frontal lobe which measures 7.9 x 4.9 x 3.6 cm, previously measuring 7.8 x 5.2 x 3.4 cm when remeasured in a similar manner. Similar surrounding edema and associated mass effect.  Approximately 5 mm rightward midline shift anteriorly similar to prior. Extension of hemorrhage into the adjacent left frontal horn with similar appearance of blood products in the left lateral ventricle. Increased blood products within the atrium and occipital horn of the right lateral ventricle. Slightly decreased blood products in the third ventricle. Small focus of subdural hemorrhage along the anterior falx. Scattered areas of subarachnoid hemorrhage along the right frontal lobe and right sylvian fissure again noted. Remote infarct in the right occipital lobe. Vascular: No hyperdense vessel or unexpected calcification. Skull: Normal. Negative for fracture or focal lesion. Sinuses/Orbits: No acute finding. Other: Mastoid air cells are clear. IMPRESSION: Similar appearance of intraparenchymal hematoma in the left frontal lobe with associated mass effect and 5 mm rightward midline shift. Additional small multi compartment intracranial hemorrhage as above.  Slightly increased blood products within the atrium and occipital horn of the right lateral ventricle. Electronically Signed   By: Emily Filbert M.D.   On: 12/01/2023 13:34   ECHOCARDIOGRAM COMPLETE Result Date: 11/30/2023    ECHOCARDIOGRAM REPORT   Patient Name:   RAYMUNDO ROUT Date of Exam: 11/30/2023 Medical Rec #:  [id number]        Height:       72.0 in Accession #:    [id number]       Weight:       147.0 lb Date of Birth:  02/17/1951         BSA:          1.869 m Patient Age:    72 years         BP:           129/68 mmHg Patient Gender: M                HR:           77 bpm. Exam Location:  Inpatient Procedure: 2D Echo (Both Spectral and Color Flow Doppler were utilized during            procedure). Indications:    intracerebral hemorrhage  History:        Patient has prior history of Echocardiogram examinations, most                 recent 01/02/2013. Risk Factors:Hypertension, Diabetes and                 Dyslipidemia.  Sonographer:    Delcie Roch RDCS Referring Phys: [phone] WHITNEY D HARRIS IMPRESSIONS  1. Left ventricular ejection fraction, by estimation, is 60 to 65%. The left ventricle has normal function. The left ventricle has no regional wall motion abnormalities. Left ventricular diastolic parameters were normal.  2. Right ventricular systolic function is normal. The right ventricular size is normal. There is mildly elevated pulmonary artery systolic pressure.  3. The mitral valve is degenerative. Mild mitral valve regurgitation. No evidence of mitral stenosis.  4. The aortic valve is tricuspid. There is mild calcification of the aortic valve. Aortic valve regurgitation is not visualized. Aortic valve sclerosis is present, with no evidence of aortic valve stenosis.  5. The inferior vena cava is normal in size with greater than 50% respiratory variability, suggesting right atrial pressure of 3 mmHg. FINDINGS  Left Ventricle: Left ventricular ejection fraction, by estimation, is 60 to 65%. The  left ventricle has normal function. The left ventricle has no regional wall motion abnormalities. Strain imaging was not performed. The left ventricular internal cavity  size was normal in size. There is no  left ventricular hypertrophy. Left ventricular diastolic parameters were normal. Right Ventricle: The right ventricular size is normal. No increase in right ventricular wall thickness. Right ventricular systolic function is normal. There is mildly elevated pulmonary artery systolic pressure. The tricuspid regurgitant velocity is 2.89  m/s, and with an assumed right atrial pressure of 3 mmHg, the estimated right ventricular systolic pressure is 36.4 mmHg. Left Atrium: Left atrial size was normal in size. Right Atrium: Right atrial size was normal in size. Pericardium: There is no evidence of pericardial effusion. Mitral Valve: The mitral valve is degenerative in appearance. There is mild calcification of the anterior mitral valve leaflet(s). Mild mitral valve regurgitation. No evidence of mitral valve stenosis. Tricuspid Valve: The tricuspid valve is grossly normal. Tricuspid valve regurgitation is mild . No evidence of tricuspid stenosis. Aortic Valve: The aortic valve is tricuspid. There is mild calcification of the aortic valve. Aortic valve regurgitation is not visualized. Aortic valve sclerosis is present, with no evidence of aortic valve stenosis. Pulmonic Valve: The pulmonic valve was grossly normal. Pulmonic valve regurgitation is not visualized. No evidence of pulmonic stenosis. Aorta: The aortic root and ascending aorta are structurally normal, with no evidence of dilitation. Venous: The inferior vena cava is normal in size with greater than 50% respiratory variability, suggesting right atrial pressure of 3 mmHg. IAS/Shunts: The atrial septum is grossly normal. Additional Comments: 3D imaging was not performed.  LEFT VENTRICLE PLAX 2D LVIDd:         3.20 cm   Diastology LVIDs:         1.70 cm   LV e'  medial:    8.16 cm/s LV PW:         1.20 cm   LV E/e' medial:  10.7 LV IVS:        1.00 cm   LV e' lateral:   11.20 cm/s LVOT diam:     2.40 cm   LV E/e' lateral: 7.8 LV SV:         115 LV SV Index:   61 LVOT Area:     4.52 cm  RIGHT VENTRICLE             IVC RV Basal diam:  2.80 cm     IVC diam: 1.70 cm RV S prime:     12.90 cm/s TAPSE (M-mode): 2.2 cm LEFT ATRIUM             Index        RIGHT ATRIUM           Index LA diam:        3.30 cm 1.77 cm/m   RA Area:     13.00 cm LA Vol (A2C):   45.6 ml 24.39 ml/m  RA Volume:   29.50 ml  15.78 ml/m LA Vol (A4C):   39.1 ml 20.92 ml/m LA Biplane Vol: 43.8 ml 23.43 ml/m  AORTIC VALVE LVOT Vmax:   125.00 cm/s LVOT Vmean:  83.200 cm/s LVOT VTI:    0.254 m  AORTA Ao Root diam: 3.30 cm Ao Asc diam:  3.20 cm MITRAL VALVE               TRICUSPID VALVE MV Area (PHT): 3.37 cm    TR Peak grad:   33.4 mmHg MV Decel Time: 225 msec    TR Vmax:        289.00 cm/s MR Peak grad: 113.2 mmHg MR Mean grad: 73.0 mmHg    SHUNTS MR Vmax:  532.00 cm/s  Systemic VTI:  0.25 m MR Vmean:     403.0 cm/s   Systemic Diam: 2.40 cm MV E velocity: 87.30 cm/s MV A velocity: 70.40 cm/s MV E/A ratio:  1.24 Lennie Odor MD Electronically signed by Lennie Odor MD Signature Date/Time: 11/30/2023/8:57:24 PM    Final    CT Head Wo Contrast Result Date: 11/30/2023 CLINICAL DATA:  Follow-up head trauma EXAM: CT HEAD WITHOUT CONTRAST TECHNIQUE: Contiguous axial images were obtained from the base of the skull through the vertex without intravenous contrast. RADIATION DOSE REDUCTION: This exam was performed according to the departmental dose-optimization program which includes automated exposure control, adjustment of the mA and/or kV according to patient size and/or use of iterative reconstruction technique. COMPARISON:  Head CT from the same day at 2 a.m. FINDINGS: Brain: Growing hematoma in the anterior left frontal lobe, hemorrhagic area measuring 7.3 x 5 x 3.4 cm. The hemorrhage has now  decompressed into the left lateral ventricle with tracking to the level of the fourth ventricle. No hydrocephalus. Patchy subarachnoid hemorrhage along the frontal lobes and sylvian fissures which appears similar. The hemorrhage and edema causes progressive swelling with anterior midline shift measuring 5 mm. Chronic right occipital infarct. Low-density in the cerebral white matter from chronic small vessel ischemia. Vascular: No hyperdense vessel or unexpected calcification. Skull: No acute finding Sinuses/Orbits: No evidence of injury Critical Value/emergent results were called by telephone at the time of interpretation on 11/30/2023 at 8:45 am to provider O[place], who verbally acknowledged these results. IMPRESSION: Progressive left frontal hematoma now measuring 60 cc with new intraventricular extension reaching the fourth ventricle. Patchy bilateral subarachnoid hemorrhage appears similar to prior. The growing hematoma and edema causes anterior midline shift by 5 mm. No hydrocephalus. Electronically Signed   By: Tiburcio Pea M.D.   On: 11/30/2023 08:46   CT Head Wo Contrast Result Date: 11/30/2023 CLINICAL DATA:  Head trauma, minor (Age >= 65y); Neck trauma (Age >= 65y) EXAM: CT HEAD WITHOUT CONTRAST CT CERVICAL SPINE WITHOUT CONTRAST TECHNIQUE: Multidetector CT imaging of the head and cervical spine was performed following the standard protocol without intravenous contrast. Multiplanar CT image reconstructions of the cervical spine were also generated. RADIATION DOSE REDUCTION: This exam was performed according to the departmental dose-optimization program which includes automated exposure control, adjustment of the mA and/or kV according to patient size and/or use of iterative reconstruction technique. COMPARISON:  MRI head November 15, 2023. FINDINGS: CT HEAD FINDINGS Brain: Acute intraparenchymal hemorrhage in the left frontal lobe measuring 2.6 cm with overlying extra-axial extension. Small volume of  multifocal subarachnoid hemorrhage bilaterally along the right frontal convexity, left frontal convexity and layering within the left sylvian fissure. No midline shift. No hydrocephalus. Remote right occipital infarct. Vascular: Calcific atherosclerosis.  No dense vessel. Skull: No acute fracture. Sinuses/Orbits: Clear sinuses.  No acute orbital findings. Other: No mastoid effusions. CT CERVICAL SPINE FINDINGS Alignment: No substantial sagittal subluxation. Skull base and vertebrae: No acute fracture. Soft tissues and spinal canal: No prevertebral fluid or swelling. No visible canal hematoma. Disc levels:  Moderate multilevel degenerative change. Upper chest: Visualized lung apices are clear. IMPRESSION: 1. Acute intraparenchymal hemorrhage in left frontal lobe with multifocal bilateral small volume subarachnoid hemorrhage. No midline shift. 2. No acute fracture or traumatic malalignment in the cervical spine. Findings discussed with Dr. Clayborne Dana via telephone at 2:19 a.m. Electronically Signed   By: Feliberto Harts M.D.   On: 11/30/2023 02:21   CT Cervical Spine Wo Contrast Result  Date: 11/30/2023 CLINICAL DATA:  Head trauma, minor (Age >= 65y); Neck trauma (Age >= 65y) EXAM: CT HEAD WITHOUT CONTRAST CT CERVICAL SPINE WITHOUT CONTRAST TECHNIQUE: Multidetector CT imaging of the head and cervical spine was performed following the standard protocol without intravenous contrast. Multiplanar CT image reconstructions of the cervical spine were also generated. RADIATION DOSE REDUCTION: This exam was performed according to the departmental dose-optimization program which includes automated exposure control, adjustment of the mA and/or kV according to patient size and/or use of iterative reconstruction technique. COMPARISON:  MRI head November 15, 2023. FINDINGS: CT HEAD FINDINGS Brain: Acute intraparenchymal hemorrhage in the left frontal lobe measuring 2.6 cm with overlying extra-axial extension. Small volume of  multifocal subarachnoid hemorrhage bilaterally along the right frontal convexity, left frontal convexity and layering within the left sylvian fissure. No midline shift. No hydrocephalus. Remote right occipital infarct. Vascular: Calcific atherosclerosis.  No dense vessel. Skull: No acute fracture. Sinuses/Orbits: Clear sinuses.  No acute orbital findings. Other: No mastoid effusions. CT CERVICAL SPINE FINDINGS Alignment: No substantial sagittal subluxation. Skull base and vertebrae: No acute fracture. Soft tissues and spinal canal: No prevertebral fluid or swelling. No visible canal hematoma. Disc levels:  Moderate multilevel degenerative change. Upper chest: Visualized lung apices are clear. IMPRESSION: 1. Acute intraparenchymal hemorrhage in left frontal lobe with multifocal bilateral small volume subarachnoid hemorrhage. No midline shift. 2. No acute fracture or traumatic malalignment in the cervical spine. Findings discussed with Dr. Clayborne Dana via telephone at 2:19 a.m. Electronically Signed   By: Feliberto Harts M.D.   On: 11/30/2023 02:21   DG Chest Portable 1 View Result Date: 11/30/2023 CLINICAL DATA:  Fall EXAM: PORTABLE CHEST 1 VIEW COMPARISON:  03/05/2012 FINDINGS: There is asymmetric airspace infiltrate within the right mid lung zone, possibly infectious or posttraumatic given the history of recent trauma. No pneumothorax or pleural effusion. Cardiac size within normal limits. Pulmonary vascularity is normal. No acute bone abnormality. IMPRESSION: 1. Asymmetric airspace infiltrate within the right mid lung zone, possibly infectious or posttraumatic given the history of recent trauma. Electronically Signed   By: Helyn Numbers M.D.   On: 11/30/2023 01:18   DG Pelvis Portable Result Date: 11/30/2023 CLINICAL DATA:  Fall EXAM: PORTABLE PELVIS 1-2 VIEWS COMPARISON:  None Available. FINDINGS: Left total hip arthroplasty has been performed. No acute fracture or dislocation. Mild right hip degenerative  arthritis. Vascular calcifications noted. Soft tissues are otherwise unremarkable. IMPRESSION: 1. Left total hip arthroplasty. No acute fracture or dislocation. Electronically Signed   By: Helyn Numbers M.D.   On: 11/30/2023 01:17   DG HIP UNILAT W OR W/O PELVIS 2-3 VIEWS LEFT Result Date: 11/16/2023 CLINICAL DATA:  [id number] History of left hip hemiarthroplasty [id number] EXAM: DG HIP (WITH OR WITHOUT PELVIS) 2-3V LEFT COMPARISON:  11/15/2023 FINDINGS: Interval left hip arthroplasty, components projecting in expected location. No fracture or dislocation. Surgical clips project over the pubic bones. Patchy iliofemoral arterial calcifications. IMPRESSION: Left hip arthroplasty, without apparent complication. Electronically Signed   By: Corlis Leak M.D.   On: 11/16/2023 16:11   MR BRAIN WO CONTRAST Result Date: 11/15/2023 CLINICAL DATA:  Abnl CT Head, evaluate for pontine infarct EXAM: MRI HEAD WITHOUT CONTRAST TECHNIQUE: Multiplanar, multiecho pulse sequences of the brain and surrounding structures were obtained without intravenous contrast. COMPARISON:  CT head from today. FINDINGS: Brain: No acute infarction, hemorrhage, hydrocephalus, extra-axial collection or mass lesion. Remote right PCA territory infarct. Moderate T2/FLAIR hyperintensities in the white matter, compatible with chronic microvascular ischemic disease. Vascular:  Major arterial flow voids are maintained at the skull base. Skull and upper cervical spine: Normal marrow signal. Sinuses/Orbits: Negative. Other: No sizable mastoid effusions. IMPRESSION: 1. No evidence of acute intracranial abnormality. 2. Remote right PCA territory infarct. Electronically Signed   By: Feliberto Harts M.D.   On: 11/15/2023 22:38   CT Head Wo Contrast Result Date: 11/15/2023 CLINICAL DATA:  Altered mental status, fall EXAM: CT HEAD WITHOUT CONTRAST TECHNIQUE: Contiguous axial images were obtained from the base of the skull through the vertex without intravenous  contrast. RADIATION DOSE REDUCTION: This exam was performed according to the departmental dose-optimization program which includes automated exposure control, adjustment of the mA and/or kV according to patient size and/or use of iterative reconstruction technique. COMPARISON:  None Available. FINDINGS: Brain: No acute territorial infarction, hemorrhage or intracranial mass. 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 Atrophy and advanced chronic small vessel ischemic changes of the white matter. Hypodensity at the pons, series 2, image 11 and sagittal series 6 image 26. Ventricles are nonenlarged Vascular: No hyperdense vessels. Carotid and vertebral calcifications. Skull: Normal. Negative for fracture or focal lesion. Sinuses/Orbits: No acute finding. Other: None IMPRESSION: 1. Negative for acute intracranial hemorrhage or mass. 2. Hypodensity at the pons, though some of this may be related to artifact, the finding is rounded with some sparing of peripheral tissue and acute or subacute pontine infarct cannot be excluded. MRI is recommended for further evaluation. 3. Atrophy and chronic small vessel ischemic changes of the white matter Electronically Signed   By: Jasmine Pang M.D.   On: 11/15/2023 18:51   DG Ankle Complete Left Result Date: 11/15/2023 CLINICAL DATA:  Fall with ankle pain EXAM: LEFT ANKLE COMPLETE - 3+ VIEW COMPARISON:  01/30/2012 FINDINGS: Lateral plate and multiple fixating screws with additional medial malleolar fixating screws. Chronic postsurgical, posttraumatic and post infectious changes involving the distal tibia, distal fibula and tibiotalar joint. Fused appearance across the tibiotalar joint and probable subtalar fusion as well. No definite acute fracture is seen. IMPRESSION: Chronic postsurgical, posttraumatic and post infectious changes involving the distal tibia, distal fibula and tibiotalar joint. No definite acute osseous abnormality.  Electronically Signed   By: Jasmine Pang M.D.   On: 11/15/2023 18:09   DG Foot Complete Left Result Date: 11/15/2023 CLINICAL DATA:  Larey Seat 2 weeks ago with left foot pain. EXAM: LEFT FOOT - COMPLETE 3+ VIEW COMPARISON:  Left ankle 01/30/2012 FINDINGS: Diffuse bone demineralization. Degenerative changes in the interphalangeal joints, metatarsal-phalangeal, tarsometatarsal, and interphalangeal joints. Postoperative fusion of the ankle joint with plate and screw fixation. No evidence of acute fracture or dislocation. Soft tissues are unremarkable. IMPRESSION: 1. No acute bony abnormalities. 2. Degenerative changes in the left foot. 3. Postoperative changes with surgical fusion of the left ankle including plate and screw fixation Electronically Signed   By: Burman Nieves M.D.   On: 11/15/2023 18:03   DG Hip Unilat W or Wo Pelvis 2-3 Views Left Result Date: 11/15/2023 CLINICAL DATA:  Fall 2 weeks ago. Dizziness with fall forward. Left foot pain. EXAM: DG HIP (WITH OR WITHOUT PELVIS) 2-3V LEFT COMPARISON:  None Available. FINDINGS: Transverse subcapital fracture of the left femoral neck with varus angulation of the fracture fragments. No inter trochanteric involvement is identified. No dislocation at the hip joint. Pelvis appears intact. SI joints and symphysis pubis are not displaced. No destructive or expansile bone lesions. Degenerative changes in the lower lumbar spine and both hips. Vascular calcifications. IMPRESSION: Acute transverse fracture  of the left femoral neck with varus angulation of the fracture fragments. Degenerative changes. Electronically Signed   By: Burman Nieves M.D.   On: 11/15/2023 18:02    Assessment: 73 y.o. male PMH of DM, HTN, HLD, prostate cancer, left foot fracture with prosthesis, dementia and recent fall with acute transverse fracture of the left femoral neck s/p hemiarthroplasty on 2/2. Post op delirium/confusion but MRI at that time no acute finding, no stroke or hemorrhage.  No sign of CAA. He was put on ASA 81 bid for 4 weeks for DVT prophylaxis. He was discharged to SNF on 11/20/23. Readmitted on 2/15 with fall at SNF and left eyebrow laceration. CT head showed left frontal small ICH with bilateral small SAH, concerning for traumatic ICH with contusion. However repeat CT 2/16 showed significant enlarged hematoma on the left frontal area with multi-lobe configuration and IVH and SAH but no hydro with 5mm MLS. CT repeat today showed stable hematoma, IVH and SAH as well MLS. Echo unremarkable.  Pt's fall with left eyebrow laceration makes traumatic ICH/SAH top at DDx but the progression to large hematoma, IVH is atypical for traumatic ICH but concerning for CAA. However, pt MRI 2/2 did not show sign of microhemorrhages. DDx also including ischemic stroke with hemorrhagic conversion and hypertensive but pt BP seems stable throughout.  Plan: - pt mental status seems improved some, no worsening and CT repeat today showed stable MLS, Na 140 now, will hold off hypertonic saline for now - repeat CT, check CTA head and neck in am - check MRI brain with and without contrast later to rule out underlying mass - BP goal < 160 now - PT OT and speech - AKI improving, OK to have CTA in am - dysphagia, s/p cortrak, will start tube feeding.  - on keppra 500 bid, continue for now - hold off antiplatelet or DVT prophylaxis now. - discussed with Dr. Danielle Dess - will follow  Thank you for this consultation and allowing [place] to participate in the care of this patient.  Marvel Plan, MD PhD Stroke Neurology 12/01/2023 3:34 PM  This patient is critically ill due to ICH, IVH, SAH and at significant risk of neurological worsening, death form cerebral edema, brain herniation. This patient's care requires constant monitoring of vital signs, hemodynamics, respiratory and cardiac monitoring, review of multiple databases, neurological assessment, discussion with family, other specialists and medical  decision making of high complexity. I spent 40 minutes of neurocritical care time in the care of this patient. Discussed with Dr. Danielle Dess.

## 2023-12-01 NOTE — Progress Notes (Signed)
RN notified E-link that patient's SBP has been above 150, and inquired about a continuous infusion for blood pressure control, given that PRN labetalol is only to be given for SBP over 160.

## 2023-12-01 NOTE — Progress Notes (Signed)
Patient ID: William Todd, male   DOB: 1951-01-19, 73 y.o.   MRN: [id number] Vital signs are stable.  Patient arouses to loud voice.  Review of CT scan demonstrates further coalescence and enlargement of hematoma.  This appears to my eye to be more consistent with amyloid angiopathy opposed to a pure traumatic hemorrhage.  I have discussed this with Dr. Raynald Kemp from neurology who will evaluate and give his opinion regarding this situation.  In the meantime he continues conservative care.  There questioning whether he can have DVT prophylaxis and I would hold off on this at the current time other than for chemical DVT prophylaxis.

## 2023-12-01 NOTE — Evaluation (Signed)
Physical Therapy Evaluation Patient Details Name: William Todd MRN: [id number] DOB: Feb 13, 1951 Today's Date: 12/01/2023  History of Present Illness  Pt is 73 yo male who returns on 11/29/23 from [facility] after falling on L side where he just had hip sx. Xray of pelvis shows stable hip, CT of head showed acute IPH of L frontal lobe and bilateral small SAH. Cognitive status declined and pt found to have L frontal hematoma with extension into 4th ventricle.  PMH: HTN, recurrent mechanical falls, protein calorie malnutrition, DM2, CKD, and prostate cancer 2014, L foot fx with AFO,  L THA 11/16/23, anxiety, dementia  Clinical Impression  Pt admitted with above diagnosis. Pt comes in from [facility] having fallen. Was about to go home for HHPT/OT this coming weekend. Pt with minimal verbalization during eval, follows 1 step commands except with R hand which he moves spontaneously but did not use when cued. Noted swelling R hand. Needed mod A to come to EOB and max A +2 to stand and pivot to recliner. Given that pt was about to go home recommend intensive inpatient therapy >3 hrs/day so that plan can continue.  Pt currently with functional limitations due to the deficits listed below (see PT Problem List). Pt will benefit from acute skilled PT to increase their independence and safety with mobility to allow discharge.           If plan is discharge home, recommend the following: Assistance with cooking/housework;Direct supervision/assist for medications management;Direct supervision/assist for financial management;Assist for transportation;Help with stairs or ramp for entrance;Supervision due to cognitive status;A lot of help with walking and/or transfers;A lot of help with bathing/dressing/bathroom   Can travel by private vehicle   No    Equipment Recommendations None recommended by PT  Recommendations for Other Services  Rehab consult    Functional Status Assessment Patient has had a recent  decline in their functional status and demonstrates the ability to make significant improvements in function in a reasonable and predictable amount of time.     Precautions / Restrictions Precautions Precautions: Fall;Posterior Hip (posterior hip from 11/16/23) Precaution Booklet Issued: No Recall of Precautions/Restrictions: Impaired Required Braces or Orthoses: Other Brace Other Brace: L AFO Restrictions Weight Bearing Restrictions Per Provider Order: No LLE Weight Bearing Per Provider Order: Weight bearing as tolerated      Mobility  Bed Mobility Overal bed mobility: Needs Assistance Bed Mobility: Supine to Sit     Supine to sit: Mod assist     General bed mobility comments: mod A to initiate and assist LE's off bed and elevation of trunk    Transfers Overall transfer level: Needs assistance Equipment used: 2 person hand held assist Transfers: Sit to/from Stand, Bed to chair/wheelchair/BSC Sit to Stand: Max assist, +2 physical assistance   Step pivot transfers: Max assist, +2 physical assistance       General transfer comment: max A +2 for fwd wt shift and power up. Pt not stepping feet with pivot to R to recliner.    Ambulation/Gait               General Gait Details: unable today  Stairs            Wheelchair Mobility     Tilt Bed    Modified Rankin (Stroke Patients Only)       Balance Overall balance assessment: Needs assistance Sitting-balance support: Bilateral upper extremity supported, Feet supported Sitting balance-Leahy Scale: Poor Sitting balance - Comments: posterior lean, mod  to min A needed Postural control: Posterior lean Standing balance support: Bilateral upper extremity supported, Single extremity supported, During functional activity Standing balance-Leahy Scale: Poor Standing balance comment: +2 support                             Pertinent Vitals/Pain Pain Assessment Pain Assessment: Faces Faces Pain  Scale: Hurts a little bit Pain Location: L hip Pain Descriptors / Indicators: Discomfort Pain Intervention(s): Monitored during session    Home Living Family/patient expects to be discharged to:: Skilled nursing facility Living Arrangements: Spouse/significant other Available Help at Discharge: Family Type of Home: House Home Access: Stairs to enter Entrance Stairs-Rails: Doctor, general practice of Steps: 1 from the garage into the house Alternate Level Stairs-Number of Steps: 13 upstiars to bed/bath. Does have a murphy bed downstairs in a sunken room - 1 step down. Home Layout: Two level;1/2 bath on main level;Able to live on main level with bedroom/bathroom Home Equipment: Cane - single point;Rolling Walker (2 wheels) Additional Comments: pt was at [facility] place rehab when fall occurred. Pt was using w/c there and only using RW with therapy so wife is uncertain how fall occurred. Pt was scheduled to d/c home this weekend.    Prior Function Prior Level of Function : Independent/Modified Independent             Mobility Comments: has AFO for L foot, used cane before L hip fx ADLs Comments: requires (A) since 2/4 admission due to hip surgery. uncertain how indep pt demonstrates at SNF due to staff always helping     Extremity/Trunk Assessment   Upper Extremity Assessment Upper Extremity Assessment: Defer to OT evaluation RUE Deficits / Details: demonstrates use of both hands to pull eye patch off LUE Deficits / Details: no deficits noted    Lower Extremity Assessment Lower Extremity Assessment: Generalized weakness (L>R due to hip sx)    Cervical / Trunk Assessment Cervical / Trunk Assessment: Other exceptions Cervical / Trunk Exceptions: Forward head posture with rounded shoulders  Communication   Communication Communication: Impaired    Cognition Arousal: Alert Behavior During Therapy: Flat affect   PT - Cognitive impairments: Difficult to  assess Difficult to assess due to: Impaired communication                     PT - Cognition Comments: pt with minimal verbalization today. When wife asks if he is mad he says yes. Unclear how much avoidance of verbalization is behavioral vs neuro Following commands: Impaired Following commands impaired: Follows one step commands inconsistently, Follows one step commands with increased time     Cueing Cueing Techniques: Verbal cues, Gestural cues, Tactile cues, Visual cues     General Comments General comments (skin integrity, edema, etc.): pt not using RUE when instructed but uses it spontaneously. Noted L eyebrow wound, stitched    Exercises     Assessment/Plan    PT Assessment Patient needs continued PT services  PT Problem List Decreased strength;Decreased activity tolerance;Decreased balance;Decreased range of motion;Decreased mobility;Decreased cognition;Decreased knowledge of use of DME;Decreased safety awareness;Decreased knowledge of precautions;Pain;Decreased coordination       PT Treatment Interventions DME instruction;Gait training;Stair training;Functional mobility training;Therapeutic activities;Therapeutic exercise;Balance training;Patient/family education    PT Goals (Current goals can be found in the Care Plan section)  Acute Rehab PT Goals Patient Stated Goal: None stated PT Goal Formulation: With patient/family Time For Goal Achievement: 12/15/23 Potential to Achieve  Goals: Good    Frequency Min 1X/week     Co-evaluation PT/OT/SLP Co-Evaluation/Treatment: Yes Reason for Co-Treatment: Complexity of the patient's impairments (multi-system involvement);For patient/therapist safety;Necessary to address cognition/behavior during functional activity PT goals addressed during session: Mobility/safety with mobility;Balance   SLP goals addressed during session: Swallowing     AM-PAC PT "6 Clicks" Mobility  Outcome Measure Help needed turning from your  back to your side while in a flat bed without using bedrails?: A Lot Help needed moving from lying on your back to sitting on the side of a flat bed without using bedrails?: A Lot Help needed moving to and from a bed to a chair (including a wheelchair)?: Total Help needed standing up from a chair using your arms (e.g., wheelchair or bedside chair)?: Total Help needed to walk in hospital room?: Total Help needed climbing 3-5 steps with a railing? : Total 6 Click Score: 8    End of Session Equipment Utilized During Treatment: Gait belt Activity Tolerance: Patient tolerated treatment well Patient left: in chair;with call bell/phone within reach;with chair alarm set;with family/visitor present Nurse Communication: Mobility status PT Visit Diagnosis: Unsteadiness on feet (R26.81);History of falling (Z91.81);Difficulty in walking, not elsewhere classified (R26.2)    Time: 1610-9604 PT Time Calculation (min) (ACUTE ONLY): 45 min   Charges:   PT Evaluation $PT Eval Moderate Complexity: 1 Mod PT Treatments $Therapeutic Activity: 8-22 mins PT General Charges $$ ACUTE PT VISIT: 1 Visit         [name], PT  Acute Rehab Services Secure chat preferred Office [phone]   Lawana Chambers Arlena Marsan 12/01/2023, 1:53 PM

## 2023-12-01 NOTE — Evaluation (Signed)
Occupational Therapy Evaluation Patient Details Name: William Todd MRN: [id number] DOB: 1951-07-23 Today's Date: 12/01/2023   History of Present Illness   Pt is 73 yo male who returns on 11/29/23 from [facility] after falling on L side where he just had hip sx. Xray of pelvis shows stable hip, CT of head showed acute IPH of L frontal lobe and bilateral small SAH. Cognitive status declined and pt found to have L frontal hematoma with extension into 4th ventricle.  PMH: HTN, recurrent mechanical falls, protein calorie malnutrition, DM2, CKD, and prostate cancer 2014, L foot fx with AFO,  L THA 11/16/23, anxiety, dementia     Clinical Impressions PT admitted with s/p fall with IPH and SAH. Pt currently with functional limitiations due to the deficits listed below (see OT problem list). Pt was at SNF with readiness to d/c home over this coming weekend per wife. Pt at this time demonstrates decreased cognition and not following 1 step commands. Pt answers "yes" pt most questions. Due to cognitive deficits difficult to rule out yes/ no responses. Wife reports patient was much more responsive in the early morning.  Pt will benefit from skilled OT to increase their independence and safety with adls and balance to allow discharge Patient will benefit from intensive inpatient follow-up therapy, >3 hours/day.      If plan is discharge home, recommend the following:   Two people to help with walking and/or transfers;Two people to help with bathing/dressing/bathroom     Functional Status Assessment   Patient has had a recent decline in their functional status and demonstrates the ability to make significant improvements in function in a reasonable and predictable amount of time.     Equipment Recommendations   BSC/3in1;Wheelchair (measurements OT);Wheelchair cushion (measurements OT)     Recommendations for Other Services   Rehab consult     Precautions/Restrictions    Precautions Precautions: Fall;Posterior Hip Required Braces or Orthoses: Other Brace Restrictions Weight Bearing Restrictions Per Provider Order: No LLE Weight Bearing Per Provider Order: Weight bearing as tolerated     Mobility Bed Mobility               General bed mobility comments: observed sitting eob with PT and SLP . See notes for more detail but requires (A)    Transfers                   General transfer comment: observed total +2 (A) for chair transfer.      Balance   Sitting-balance support: Bilateral upper extremity supported, Feet supported Sitting balance-Leahy Scale: Poor                                     ADL either performed or assessed with clinical judgement   ADL Overall ADL's : Needs assistance/impaired     Grooming: Maximal assistance   Upper Body Bathing: Maximal assistance   Lower Body Bathing: Maximal assistance   Upper Body Dressing : Maximal assistance   Lower Body Dressing: Maximal assistance                 General ADL Comments: pt observed two person transfer EOB to chair. pt previously one person transfer at SNF per wife     Vision Baseline Vision/History: 1 Wears glasses Ability to See in Adequate Light: 1 Impaired Vision Assessment?: Vision impaired- to be further tested in functional context Additional Comments:  wife reports that pt has peripheral visual changes and teh SNF recommended use of patch. pt during session closing R eye alot to occlude vision. pt does not attempt visual attention any better with a patch. Patch issued during session to help with advancing pt as he was starting one at the SNF. pt does not follow commands to formally test. pt does turn head to auditory name call     Perception         Praxis         Pertinent Vitals/Pain Pain Assessment Pain Assessment: Faces Faces Pain Scale: Hurts a little bit Pain Location: R hip Pain Intervention(s): Monitored during  session, Limited activity within patient's tolerance, Repositioned, Premedicated before session     Extremity/Trunk Assessment Upper Extremity Assessment Upper Extremity Assessment: Generalized weakness;Right hand dominant;RUE deficits/detail;LUE deficits/detail RUE Deficits / Details: demonstrates use of both hands to pull eye patch off LUE Deficits / Details: no deficits noted   Lower Extremity Assessment Lower Extremity Assessment: Defer to PT evaluation       Communication Communication Communication: Impaired   Cognition Arousal: Alert Behavior During Therapy: Flat affect Cognition: History of cognitive impairments, Cognition impaired     Awareness: Online awareness impaired, Intellectual awareness impaired   Attention impairment (select first level of impairment): Focused attention Executive functioning impairment (select all impairments): Initiation OT - Cognition Comments: pt starting and not responding. wife states he is mad. Pt not answering at times. pt does not follow single step commands at times. Did get a moment of participation with turning on flash light and off. pt states "i dont want it" when offered cup of water. pt noted yes to being mad. pt would never respond to who the visitor was . pt noted head yes when wife asked if he was mad at her. pt just awake and staring. wife reports he did everythign they asked him to do at the rehab and he would ask am i having therapy today becuase some days he wouldnt get any                 Following commands: Impaired Following commands impaired: Follows one step commands inconsistently     Cueing  General Comments   Cueing Techniques: Verbal cues;Gestural cues;Tactile cues;Visual cues      Exercises     Shoulder Instructions      Home Living Family/patient expects to be discharged to:: Private residence Living Arrangements: Spouse/significant other Available Help at Discharge: Family Type of Home:  House Home Access: Stairs to enter [employer] of Steps: 1 from the garage into the house Entrance Stairs-Rails: Right;Left Home Layout: Two level;1/2 bath on main level;Able to live on main level with bedroom/bathroom Alternate Level Stairs-Number of Steps: 13 upstiars to bed/bath. Does have a murphy bed downstairs in a sunken room - 1 step down.   Bathroom Shower/Tub: Tub/shower unit;Walk-in shower   Bathroom Toilet: Standard     Home Equipment: Cane - single Librarian, academic (2 wheels)   Additional Comments: pt was at [facility] place rehab when fall occurred. Pt was using w/c there and only using RW with therapy so wife is uncertain how fall occurred. Pt was scheduled to d/c home this weekend.      Prior Functioning/Environment               Mobility Comments: has AFO for L foot, uses cane but also reports ambulating without any AD ADLs Comments: requires (A) since 2/4 admission due to hip surgery.  uncertain how indep pt demonstrates at SNF due to staff always helping    OT Problem List: Impaired balance (sitting and/or standing);Decreased cognition;Decreased safety awareness;Decreased knowledge of use of DME or AE;Decreased knowledge of precautions   OT Treatment/Interventions: Self-care/ADL training;Neuromuscular education;Energy conservation;DME and/or AE instruction;Manual therapy;Therapeutic activities;Cognitive remediation/compensation;Visual/perceptual remediation/compensation;Balance training;Patient/family education      OT Goals(Current goals can be found in the care plan section)   Acute Rehab OT Goals Patient Stated Goal: to take him home when he is ready OT Goal Formulation: With patient/family Time For Goal Achievement: 12/15/23 Potential to Achieve Goals: Good   OT Frequency:  Min 1X/week    Co-evaluation              AM-PAC OT "6 Clicks" Daily Activity     Outcome Measure Help from another person eating meals?: A Lot Help from  another person taking care of personal grooming?: A Lot Help from another person toileting, which includes using toliet, bedpan, or urinal?: Total Help from another person bathing (including washing, rinsing, drying)?: A Lot Help from another person to put on and taking off regular upper body clothing?: A Lot Help from another person to put on and taking off regular lower body clothing?: Total 6 Click Score: 10   End of Session Nurse Communication: Mobility status;Precautions;Need for lift equipment  Activity Tolerance: Patient tolerated treatment well Patient left: in chair;with call bell/phone within reach;with chair alarm set;with family/visitor present  OT Visit Diagnosis: Unsteadiness on feet (R26.81)                Time: 8119-1478 OT Time Calculation (min): 25 min Charges:  OT General Charges $OT Visit: 1 Visit OT Evaluation $OT Eval Moderate Complexity: 1 Mod   Brynn, OTR/L  Acute Rehabilitation Services Office: [phone] .   Mateo Flow 12/01/2023, 12:25 PM

## 2023-12-01 NOTE — Progress Notes (Signed)
NAME:  William Todd, MRN:  [id number], DOB:  11/29/1950, LOS: 1 ADMISSION DATE:  11/29/2023, CONSULTATION DATE:  11/29/2023 REFERRING MD:  Dr. Clayborne Dana - EDP, CHIEF COMPLAINT:  SAH with IPH   History of Present Illness:  William Todd is a 73 y.o. male with a past medical history significant for HTN, recurrent mechanical falls, protein calorie malnutrition, type 2 diabetes, chronic kidney disease, and prostate cancer 2014 who presented to the ED via EMS from [facility] after suffering ground-level fall at [facility].  Workup on admission revealed acute intraparenchymal hemorrhage of the left frontal lobe with multiple bilateral small volume SAH doubt shift, neurosurgery was consulted with no surgical interventions recommended.  Patient was admitted initially per TRH.   While boarding in the emergency department patient had repeat CT scan to follow-up on ICH which revealed progressive left frontal hematoma with intraventricular extension into the fourth ventricle resulting in anterior midline shift of 5 mm, no hydrocephalus.  Neurosurgery reviewed imaging and recommended admission to ICU under PCCM for close monitoring.   Pertinent  Medical History  HTN, recurrent mechanical falls, protein calorie malnutrition, type 2 diabetes, chronic kidney disease, and prostate cancer 2014   Significant Hospital Events: Including procedures, antibiotic start and stop dates in addition to other pertinent events   2/15 presented after ground-level fall from SNF workup revealed ICH with bilateral [facility] 2/16 repeat CT while boarding in ED revealed progressive left frontal hematoma with intraventricular extension into the fourth ventricle resulting in anterior midline shift of 5 mm, no hydrocephalus.  Admission changed to ICU  Interim History / Subjective:   Patient w/ aphasia but able to speak some; MAE spontaneously; follows intermittent commands; right pupil oval shaped this am.  Objective   Blood pressure (!)  137/98, pulse 87, temperature 99.2 F (37.3 C), temperature source Axillary, resp. rate (!) 22, SpO2 100%.        Intake/Output Summary (Last 24 hours) at 12/01/2023 0737 Last data filed at 12/01/2023 0600 Gross per 24 hour  Intake 2471.41 ml  Output 2350 ml  Net 121.41 ml   There were no vitals filed for this visit.  Examination: General: NAD HEENT: MM pink/moist Neuro:  aphasia but able to speak some; MAE spontaneously; follows intermittent commands; right pupil oval irregular shaped this am. CV: s1s2, RRR, no m/r/g PULM:  dim clear BS bilaterally; RA GI: soft, bsx4 active  Extremities: warm/dry, no edema  Skin: no rashes or lesions   Resolved Hospital Problem list     Assessment & Plan:  Left frontal intracerebral traumatic hematoma Traumatic bilateral subarachnoid hemorrhages -Head CT on admission with acute intraparenchymal hemorrhage of the left frontal lobe with multiple bilateral small volume SAH doubt shift -While boarding in the emergency department repeat CT  revealed progressive left frontal hematoma with intraventricular extension into the fourth ventricle resulting in anterior midline shift of 5 mm, no hydrocephalus.  P: -NSG following; appreciate recs -CT head this am -frequent neuro checks -limit sedating meds -BP goal per NSG -SLP eval; will likely need core track  -Continue neuroprotective measures- normothermia, euglycemia, HOB greater than 30, head in neutral alignment, normocapnia, normoxia.  -PT/OT  History of mood disorder History of memory impairment -No maintenance medication at baseline.  Appears patient was able to state name and year on admission however while in the emergency department patient has become less interactive and now unable/unwilling to follow commands.  This is in the setting of worsening ICH P: -delirium precautions -limit sedating  meds  Acute kidney injury superimposed on CKD stage IIIb -Creatinine on admission 1.87 with GFR  37 compared to creatinine 1.49 with GFR 49 11/23/2023 P: -Trend BMP / urinary output -Replace electrolytes as indicated -Avoid nephrotoxic agents, ensure adequate renal perfusion  Recurrent mechanical falls with history of recent left femoral neck fracture s/p arthroplasty 11/16/2023 and recent left ankle fracture P: -Fall precautions  Essential hypertension HLD -Home medication includes Norvasc, aspirin, Lipitor, Toprol-XL P: -slp eval this am; will likely require cor track -resume home metoprolol and norvasc -prn labetalol for htn -resume statin -hold asa for now  Type 2 diabetes -Most recent hemoglobin A1c 6.4 11/16/2023 Home medications include Januvia and Amaryl P: -ssi and cbg monitoring  Normocytic anemia P: -trend cbc    Best Practice (right click and "Reselect all SmartList Selections" daily)   Diet/type: NPO; pending slp eval may start TF if core track placed DVT prophylaxis SCD Pressure ulcer(s): N/A GI prophylaxis: PPI Lines: N/A Foley:  N/A Code Status:  full code Last date of multidisciplinary goals of care discussion: pending   Labs   CBC: Recent Labs  Lab 11/29/23 2342 11/30/23 0400 12/01/23 0513  WBC 10.5 9.9 8.6  NEUTROABS 8.9*  --   --   HGB 9.6* 8.7* 8.0*  HCT 30.0* 26.7* 25.0*  MCV 94.6 94.0 93.3  PLT 212 203 154    Basic Metabolic Panel: Recent Labs  Lab 11/29/23 2342 11/30/23 0400 12/01/23 0513  NA 139 141 140  K 4.9 4.6 4.3  CL 103 105 104  CO2 25 25 26   GLUCOSE 262* 154* 141*  BUN 33* 29* 18  CREATININE 1.87* 1.57* 1.29*  CALCIUM 9.3 9.0 9.2   GFR: CrCl cannot be calculated (Unknown ideal weight.). Recent Labs  Lab 11/29/23 2342 11/30/23 0400 12/01/23 0513  WBC 10.5 9.9 8.6    Liver Function Tests: Recent Labs  Lab 11/29/23 2342 11/30/23 0400  AST 43* 43*  ALT 51* 45*  ALKPHOS 89 90  BILITOT 0.6 0.6  PROT 6.6 6.1*  ALBUMIN 3.1* 2.9*   No results for input(s): "LIPASE", "AMYLASE" in the last 168  hours. Recent Labs  Lab 11/30/23 1226  AMMONIA 14    ABG    Component Value Date/Time   TCO2 25 01/01/2013 1312     Coagulation Profile: Recent Labs  Lab 11/30/23 0400  INR 1.2    Cardiac Enzymes: No results for input(s): "CKTOTAL", "CKMB", "CKMBINDEX", "TROPONINI" in the last 168 hours.  HbA1C: Hgb A1c MFr Bld  Date/Time Value Ref Range Status  11/16/2023 06:00 AM 6.4 (H) 4.8 - 5.6 % Final    Comment:    (NOTE) Pre diabetes:          5.7%-6.4%  Diabetes:              >6.4%  Glycemic control for   <7.0% adults with diabetes   01/01/2013 04:01 PM 6.0 (H) <5.7 % Final    Comment:    (NOTE)                                                                       According to the ADA Clinical Practice Recommendations for 2011, when HbA1c is used as a screening test:  >=  6.5%   Diagnostic of Diabetes Mellitus           (if abnormal result is confirmed) 5.7-6.4%   Increased risk of developing Diabetes Mellitus References:Diagnosis and Classification of Diabetes Mellitus,Diabetes Care,2011,34(Suppl 1):S62-S69 and Standards of Medical Care in         Diabetes - 2011,Diabetes Care,2011,34 (Suppl 1):S11-S61.    CBG: Recent Labs  Lab 11/30/23 1155 11/30/23 1549 11/30/23 1944 11/30/23 2352 12/01/23 0349  GLUCAP 174* 154* 140* 148* 142*    Review of Systems:   Unable to assess   Past Medical History:  He,  has a past medical history of Anxiety, Chronic kidney disease, Diabetes mellitus, History of radiation therapy (09/29/13- 11/25/13), Hypertension, and Prostate cancer (HCC) (06/01/2013).   Surgical History:   Past Surgical History:  Procedure Laterality Date   CARDIAC CATHETERIZATION  01/01/2013   FRACTURE SURGERY     left foot    HIP ARTHROPLASTY Left 11/16/2023   Procedure: ARTHROPLASTY BIPOLAR HIP (HEMIARTHROPLASTY);  Surgeon: Sheral Apley, MD;  Location: [facility] OR;  Service: Orthopedics;  Laterality: Left;   LEFT HEART CATHETERIZATION WITH CORONARY  ANGIOGRAM N/A 01/01/2013   Procedure: LEFT HEART CATHETERIZATION WITH CORONARY ANGIOGRAM;  Surgeon: Robynn Pane, MD;  Location: [facility] CATH LAB;  Service: Cardiovascular;  Laterality: N/A;   PROSTATE BIOPSY  06/01/13     Social History:   reports that he has never smoked. He has never used smokeless tobacco. He reports that he does not drink alcohol and does not use drugs.   Family History:  His family history includes Prostate cancer (age of onset: 50) in his brother.   Allergies No Known Allergies   Home Medications  Prior to Admission medications   Medication Sig Start Date End Date Taking? Authorizing Provider  acetaminophen (TYLENOL) 325 MG tablet Take 3 tablets (975 mg total) by mouth every 8 (eight) hours for 5 days, THEN 3 tablets (975 mg total) every 8 (eight) hours as needed for up to 25 days. 11/19/23 12/19/23 Yes Almon Hercules, MD  amLODipine (NORVASC) 5 MG tablet Take 1 tablet (5 mg total) by mouth daily. 11/19/23  Yes Almon Hercules, MD  aspirin EC 81 MG tablet Take 1 tablet (81 mg total) by mouth in the morning and at bedtime for 28 days, THEN 1 tablet (81 mg total) daily. 11/19/23 03/16/24 Yes Almon Hercules, MD  atorvastatin (LIPITOR) 80 MG tablet Take 0.5 tablets (40 mg total) by mouth daily at 6 PM. 01/03/13  Yes Rinaldo Cloud, MD  famotidine (PEPCID) 20 MG tablet Take 1 tablet (20 mg total) by mouth 2 (two) times daily. Patient taking differently: Take 20 mg by mouth daily. 01/03/13  Yes Rinaldo Cloud, MD  metoprolol succinate (TOPROL-XL) 50 MG 24 hr tablet Take 1 tablet (50 mg total) by mouth daily. Take with or immediately following a meal. 01/03/13  Yes Rinaldo Cloud, MD  Nutritional Supplements (GLUCERNA CARBSTEADY) LIQD Take 237 mLs by mouth 2 (two) times daily after a meal. Patient taking differently: Take 237 mLs by mouth 3 (three) times daily with meals. 11/20/23  Yes Almon Hercules, MD  sitaGLIPtin (JANUVIA) 50 MG tablet Take 50 mg by mouth daily.   Yes [provider]   glimepiride (AMARYL) 4 MG tablet  08/30/13   [provider]  nitroGLYCERIN (NITROSTAT) 0.4 MG SL tablet Place 1 tablet (0.4 mg total) under the tongue every 5 (five) minutes x 3 doses as needed for chest pain. Patient not taking:  Reported on 11/30/2023 01/03/13   Rinaldo Cloud, MD  polyethylene glycol powder (MIRALAX) 17 GM/SCOOP powder Take 17 g by mouth 2 (two) times daily as needed for moderate constipation. Patient not taking: Reported on 11/30/2023 11/19/23   Almon Hercules, MD  senna-docusate (SENOKOT-S) 8.6-50 MG tablet Take 1 tablet by mouth 2 (two) times daily between meals as needed for mild constipation. Patient not taking: Reported on 11/30/2023 11/19/23   Almon Hercules, MD     Critical care time: 40 minutes    JD Anselm Lis [facility] 12/01/2023, 8:09 AM  Please see [email or web address] for pager details.  From 7A-7P if no response, please call [phone]. After hours, please call ELink [phone].

## 2023-12-01 NOTE — Progress Notes (Signed)
eLink Physician-Brief Progress Note Patient Name: William Todd DOB: 09-10-1951 MRN: [id number]   Date of Service  12/01/2023  HPI/Events of Note  Pt pulled cortrak and has metoprolol due.    BP 151/75, HR 82, RR 17, O2 sats 97%.   eICU Interventions  Change to lopressor 2.5mg  IV q6hrs to keep SBP <160.      Intervention Category Minor Interventions: Routine modifications to care plan (e.g. PRN medications for pain, fever)  Larinda Buttery 12/01/2023, 8:05 PM

## 2023-12-01 NOTE — Procedures (Signed)
Cortrak  Person Inserting Tube:  Dorris Carnes, RN Tube Type:  Cortrak - 43 inches Tube Size:  10 Tube Location:  Right nare Initial Placement:  Stomach Secured by: Bridle Technique Used to Measure Tube Placement:  Marking at nare/corner of mouth Cortrak Secured At:  63 cm   Cortrak Tube Team Note:  Consult received to place a Cortrak feeding tube.   No x-ray is required. RN may begin using tube.   If the tube becomes dislodged please keep the tube and contact the Cortrak team at [email or web address] for replacement.  If after hours and replacement cannot be delayed, place a NG tube and confirm placement with an abdominal x-ray.    Cammy Copa., RD, LDN, CNSC See AMiON for contact information

## 2023-12-01 NOTE — Progress Notes (Signed)
 Inpatient Rehab Admissions Coordinator:   Per therapy recommendations, patient was screened for CIR candidacy by Megan Salon, MS, CCC-SLP. At this time, Pt. is not at a level to tolerate the intensity of CIR ; however,   Pt. may have potential to progress to becoming a potential CIR candidate, so CIR admissions team will follow and monitor for progress and participation with therapies and place consult order if Pt. appears to be an appropriate candidate. Please contact me with any questions.    Megan Salon, MS, CCC-SLP Rehab Admissions Coordinator  [phone] (celll) [phone] (office)

## 2023-12-01 NOTE — Evaluation (Signed)
Clinical/Bedside Swallow Evaluation Patient Details  Name: William Todd MRN: [id number] Date of Birth: 07/10/1951  Today's Date: 12/01/2023 Time: SLP Start Time (ACUTE ONLY): 1022 SLP Stop Time (ACUTE ONLY): 1043 SLP Time Calculation (min) (ACUTE ONLY): 21 min  Past Medical History:  Past Medical History:  Diagnosis Date   Anxiety    new dx   Chronic kidney disease    Diabetes mellitus    type ii   History of radiation therapy 09/29/13- 11/25/13   prostate 7800 cGy 40 sessions, seminal vesicles 5600 cGy 40 sessions   Hypertension    Prostate cancer (HCC) 06/01/2013   gleason 7   Past Surgical History:  Past Surgical History:  Procedure Laterality Date   CARDIAC CATHETERIZATION  01/01/2013   FRACTURE SURGERY     left foot    HIP ARTHROPLASTY Left 11/16/2023   Procedure: ARTHROPLASTY BIPOLAR HIP (HEMIARTHROPLASTY);  Surgeon: Sheral Apley, MD;  Location: [facility] OR;  Service: Orthopedics;  Laterality: Left;   LEFT HEART CATHETERIZATION WITH CORONARY ANGIOGRAM N/A 01/01/2013   Procedure: LEFT HEART CATHETERIZATION WITH CORONARY ANGIOGRAM;  Surgeon: Robynn Pane, MD;  Location: [facility] CATH LAB;  Service: Cardiovascular;  Laterality: N/A;   PROSTATE BIOPSY  06/01/13   HPI:  Pt is 73 yo male who returns on 11/29/23 from [facility] after falling on L side where he just had hip sx. Xray of pelvis shows stable hip, CT of head showed acute IPH of L frontal lobe and bilateral small SAH. Cognitive status declined and pt found to have L frontal hematoma with extension into 4th ventricle. Pt was evaluated during recent hospital stay and was on regular solids, thin liquids with transient oral dysphagia felt to be related to mentation. Per wife, mentation had improved but he was still experiencing sundowning at SNF. PMH: HTN, recurrent mechanical falls, protein calorie malnutrition, DM2, CKD, and prostate cancer 2014, L foot fx with AFO,  L THA 11/16/23, anxiety, dementia    Assessment / Plan /  Recommendation  Clinical Impression  Pt was seen with PT to maximize positioning to also maximize mentation. Pt is not consistently following commands for completion of oral motor exam. He did take a small number of boluses with SLP providing hand-over-hand assist for self-feeding (mostly water, one very small amount of applesauce). What he did take, he swallowed, and without overt signs of aspiration. Given mentation and very limited acceptance of POs, would be concerned for adequate amount of PO intake (including consistent access to meds). Agree with Cortrak to provide short-term, temporary alternative access. Would offer small sips of water after oral care whenever alert and accepting. SLP will f/u, hopeful for initiation of diet if there are improvements in mentation. SLP Visit Diagnosis: Dysphagia, unspecified (R13.10)    Aspiration Risk  Risk for inadequate nutrition/hydration    Diet Recommendation Alternative means - temporary;Free water protocol after oral care    Liquid Administration via: Spoon Medication Administration: Via alternative means    Other  Recommendations Oral Care Recommendations: Oral care QID Caregiver Recommendations: Have oral suction available    Recommendations for follow up therapy are one component of a multi-disciplinary discharge planning process, led by the attending physician.  Recommendations may be updated based on patient status, additional functional criteria and insurance authorization.  Follow up Recommendations Acute inpatient rehab (3hours/day)      Assistance Recommended at Discharge    Functional Status Assessment Patient has had a recent decline in their functional status and demonstrates the ability  to make significant improvements in function in a reasonable and predictable amount of time.  Frequency and Duration min 2x/week  2 weeks       Prognosis Prognosis for improved oropharyngeal function: Good Barriers to Reach Goals: Cognitive  deficits      Swallow Study   General HPI: Pt is 73 yo male who returns on 11/29/23 from [facility] after falling on L side where he just had hip sx. Xray of pelvis shows stable hip, CT of head showed acute IPH of L frontal lobe and bilateral small SAH. Cognitive status declined and pt found to have L frontal hematoma with extension into 4th ventricle. Pt was evaluated during recent hospital stay and was on regular solids, thin liquids with transient oral dysphagia felt to be related to mentation. Per wife, mentation had improved but he was still experiencing sundowning at SNF. PMH: HTN, recurrent mechanical falls, protein calorie malnutrition, DM2, CKD, and prostate cancer 2014, L foot fx with AFO,  L THA 11/16/23, anxiety, dementia Type of Study: Bedside Swallow Evaluation Previous Swallow Assessment: see HPI Diet Prior to this Study: NPO Temperature Spikes Noted: Yes (100.7) Respiratory Status: Room air History of Recent Intubation: No Behavior/Cognition: Alert;Requires cueing Oral Cavity Assessment:  (limited visibility) Oral Care Completed by SLP: No Oral Cavity - Dentition: Adequate natural dentition Self-Feeding Abilities: Total assist Patient Positioning: Other (comment) (EOB) Baseline Vocal Quality: Low vocal intensity Volitional Cough: Cognitively unable to elicit Volitional Swallow: Unable to elicit    Oral/Motor/Sensory Function Overall Oral Motor/Sensory Function:  (not following commands for direct assessment)   Ice Chips Ice chips: Within functional limits Presentation: Spoon   Thin Liquid Thin Liquid: Impaired Presentation: Cup;Spoon;Straw;Self Fed Oral Phase Impairments: Poor awareness of bolus    Nectar Thick Nectar Thick Liquid: Not tested   Honey Thick Honey Thick Liquid: Not tested   Puree Puree: Impaired Presentation: Spoon Oral Phase Impairments: Poor awareness of bolus   Solid     Solid: Not tested      Mahala Menghini., M.A. CCC-SLP Acute Rehabilitation  Services Office [phone]  Secure chat preferred  12/01/2023,12:57 PM

## 2023-12-01 NOTE — Progress Notes (Signed)
Initial Nutrition Assessment  DOCUMENTATION CODES:   Severe malnutrition in context of chronic illness  INTERVENTION:  Initiate tube feeding via Cortrak: Osmolite 1.5 at 50 ml/h (1200 ml per day) Initiate at 107mL/hr and advance at 88mL/hr Q10 hours until goal rate achieved Prosource TF20 60 ml daily  Provides 1880 kcal, 95 gm protein, free water daily   Monitor magnesium, potassium, and phosphorus daily for at least 3 days, MD to replete as needed Add Thiamine 100 mg daily for 5 days  MVI w/ minerals  NUTRITION DIAGNOSIS:  Severe Malnutrition related to chronic illness as evidenced by severe fat depletion, severe muscle depletion, percent weight loss.   GOAL:  Patient will meet greater than or equal to 90% of their needs   MONITOR:  Diet advancement, TF tolerance, Weight trends, Labs, Skin  REASON FOR ASSESSMENT:  Consult   ASSESSMENT:   Pt recently discharged on 2/6 after mechanical fall at home resulting in L femoral neck fracture requiring L hip arthroplasty. Discharged to [facility] where he fell again resulting in ICH. PMH: CAD, HLD, T2DM, HTN, anxiety, and prostate cancer (radiation 09/29/13-11/25/13). No formal dx of dementia, however with poor short term memory.  2/6 discharged from [facility] to [facility] 2/16 admitted to [facility] w/ ICH with bilateral [facility] 2/17 Cortrak placed/TF initiated  No family at bedside this afternoon to obtain nutrition-related history. Attempted to call patient's wife and daughter to obtain, with no answer. Chart review performed to collect pertinent information, as patient recently assessed in patient two weeks ago during admission for L hip arthoplasty. Pt unable to endorse appetite level PTA or in the two weeks he has been at [facility] [employer]. For that reason, will recommend refeeding protocol. Moves all extremities spontaneously.   24 Hour Recall B: eggs, toast, coffee L: skips D: "home-cooked meal"  Snacks: packaged/convenience items  usually consumed in lieu of lunch  Admit Weight: 66.7kg Current Weight: 61.9kg  Patient unable to report UBW at time of visit and also during last admission. Noted last weight obtained 11/16/2023. Updated weight from bed scale 61.9kg. This constitutes a 7.5% wt loss in two weeks, which is considered significant. Degree of muscle and fat wasting support this.     Intake/Output Summary (Last 24 hours) at 12/01/2023 1543 Last data filed at 12/01/2023 1254 Gross per 24 hour  Intake 1100.26 ml  Output 2350 ml  Net -1249.74 ml     Creatinine trending down. CBGs desirable. No recently documented N/V/C/D.   Meds: pantroprazole, senna, keppra  Labs: Na+ 140 (wdl) CBGs 141-154 over 48 hours A1c 6.4 (11/2023) Crt 1.29 (H)  NUTRITION - FOCUSED PHYSICAL EXAM:  Flowsheet Row Most Recent Value  Orbital Region Moderate depletion  Upper Arm Region Severe depletion  Thoracic and Lumbar Region Severe depletion  Buccal Region Moderate depletion  Temple Region Moderate depletion  Clavicle Bone Region Severe depletion  Clavicle and Acromion Bone Region Severe depletion  Scapular Bone Region Severe depletion  Dorsal Hand Severe depletion  Patellar Region Severe depletion  Anterior Thigh Region Severe depletion  Posterior Calf Region Moderate depletion  Edema (RD Assessment) None  Hair Reviewed  Eyes Reviewed  Mouth Reviewed  Skin Reviewed  Nails Reviewed     Diet Order:   Diet Order             Diet NPO time specified  Diet effective now             EDUCATION NEEDS:   Not appropriate for education  at this time  Skin:  Skin Assessment: Skin Integrity Issues: Skin Integrity Issues::  (L hip arthroplasty site, now documented as stage II pressure wound)  Last BM:  PTA - pt unable to endorse  Height:  Ht Readings from Last 1 Encounters:  11/16/23 6' 0.01" (1.829 m)    Weight:  Wt Readings from Last 1 Encounters:  12/01/23 61.9 kg    Ideal Body Weight:  83.6 kg  BMI:  Body  mass index is 18.5 kg/m.  Estimated Nutritional Needs:   Kcal:  1800-2000kcal  Protein:  90-100g  Fluid:  >1.8L/day  Myrtie Cruise MS, RD, LDN Registered Dietitian Clinical Nutrition RD Inpatient Contact Info in Amion

## 2023-12-02 ENCOUNTER — Inpatient Hospital Stay (HOSPITAL_COMMUNITY): Payer: Medicare HMO

## 2023-12-02 ENCOUNTER — Other Ambulatory Visit: Payer: Self-pay | Admitting: Pulmonary Disease

## 2023-12-02 DIAGNOSIS — I609 Nontraumatic subarachnoid hemorrhage, unspecified: Secondary | ICD-10-CM | POA: Diagnosis not present

## 2023-12-02 DIAGNOSIS — I619 Nontraumatic intracerebral hemorrhage, unspecified: Secondary | ICD-10-CM | POA: Diagnosis not present

## 2023-12-02 DIAGNOSIS — I10 Essential (primary) hypertension: Secondary | ICD-10-CM | POA: Diagnosis not present

## 2023-12-02 DIAGNOSIS — N1832 Chronic kidney disease, stage 3b: Secondary | ICD-10-CM

## 2023-12-02 DIAGNOSIS — E119 Type 2 diabetes mellitus without complications: Secondary | ICD-10-CM | POA: Diagnosis not present

## 2023-12-02 LAB — BASIC METABOLIC PANEL
Anion gap: 13 (ref 5–15)
BUN: 19 mg/dL (ref 8–23)
CO2: 24 mmol/L (ref 22–32)
Calcium: 8.6 mg/dL — ABNORMAL LOW (ref 8.9–10.3)
Chloride: 97 mmol/L — ABNORMAL LOW (ref 98–111)
Creatinine, Ser: 1.17 mg/dL (ref 0.61–1.24)
GFR, Estimated: >60 mL/min (ref >60)
Glucose, Bld: 215 mg/dL — ABNORMAL HIGH (ref 70–99)
Potassium: 3.9 mmol/L (ref 3.5–5.1)
Sodium: 134 mmol/L — ABNORMAL LOW (ref 135–145)

## 2023-12-02 LAB — GLUCOSE, CAPILLARY
Glucose-Capillary: 207 mg/dL — ABNORMAL HIGH (ref 70–99)
Glucose-Capillary: 222 mg/dL — ABNORMAL HIGH (ref 70–99)
Glucose-Capillary: 197 mg/dL — ABNORMAL HIGH (ref 70–99)
Glucose-Capillary: 199 mg/dL — ABNORMAL HIGH (ref 70–99)
Glucose-Capillary: 95 mg/dL (ref 70–99)

## 2023-12-02 LAB — CBC
HCT: 23.9 % — ABNORMAL LOW (ref 39.0–52.0)
Hemoglobin: 8 g/dL — ABNORMAL LOW (ref 13.0–17.0)
MCH: 30.4 pg (ref 26.0–34.0)
MCHC: 33.5 g/dL (ref 30.0–36.0)
MCV: 90.9 fL (ref 80.0–100.0)
Platelets: 164 10*3/uL (ref 150–400)
RBC: 2.63 MIL/uL — ABNORMAL LOW (ref 4.22–5.81)
RDW: 13.8 % (ref 11.5–15.5)
WBC: 8.9 10*3/uL (ref 4.0–10.5)
nRBC: 0 % (ref 0.0–0.2)

## 2023-12-02 LAB — MAGNESIUM: Magnesium: 1.7 mg/dL (ref 1.7–2.4)

## 2023-12-02 LAB — LIPID PANEL
Cholesterol: 111 mg/dL (ref 0–200)
HDL: 41 mg/dL (ref >40)
LDL Cholesterol: 57 mg/dL (ref 0–99)
Total CHOL/HDL Ratio: 2.7 {ratio}
Triglycerides: 63 mg/dL (ref <150)
VLDL: 13 mg/dL (ref 0–40)

## 2023-12-02 LAB — HEMOGLOBIN A1C
Hgb A1c MFr Bld: 6.1 % — ABNORMAL HIGH (ref 4.8–5.6)
Mean Plasma Glucose: 128.37 mg/dL

## 2023-12-02 LAB — PHOSPHORUS: Phosphorus: 3.4 mg/dL (ref 2.5–4.6)

## 2023-12-02 LAB — SODIUM
Sodium: 140 mmol/L (ref 135–145)
Sodium: 147 mmol/L — ABNORMAL HIGH (ref 135–145)

## 2023-12-02 MED ORDER — ORAL CARE MOUTH RINSE
15.0000 mL | OROMUCOSAL | Status: DC
  Administered 2023-12-02 – 2023-12-04 (×8): 15 mL via OROMUCOSAL

## 2023-12-02 MED ORDER — SODIUM CHLORIDE 3 % IV SOLN
INTRAVENOUS | Status: DC
  Filled 2023-12-02 (×3): qty 500

## 2023-12-02 MED ORDER — INSULIN ASPART 100 UNIT/ML IJ SOLN
0.0000 [IU] | Freq: Three times a day (TID) | INTRAMUSCULAR | Status: DC
  Administered 2023-12-02 – 2023-12-03 (×2): 2 [IU] via SUBCUTANEOUS
  Administered 2023-12-03: 1 [IU] via SUBCUTANEOUS
  Administered 2023-12-03: 3 [IU] via SUBCUTANEOUS

## 2023-12-02 MED ORDER — SODIUM CHLORIDE 23.4 % INJECTION (4 MEQ/ML) FOR IV ADMINISTRATION
120.0000 meq | Freq: Once | INTRAVENOUS | Status: AC
  Administered 2023-12-02: 120 meq via INTRAVENOUS
  Filled 2023-12-02: qty 30

## 2023-12-02 MED ORDER — INSULIN ASPART 100 UNIT/ML IJ SOLN
0.0000 [IU] | Freq: Every day | INTRAMUSCULAR | Status: DC
  Administered 2023-12-03: 2 [IU] via SUBCUTANEOUS

## 2023-12-02 MED ORDER — IOHEXOL 350 MG/ML SOLN
75.0000 mL | Freq: Once | INTRAVENOUS | Status: AC | PRN
  Administered 2023-12-02: 75 mL via INTRAVENOUS

## 2023-12-02 NOTE — Progress Notes (Signed)
Physical Therapy Treatment Patient Details Name: William Todd MRN: [id number] DOB: 20-Dec-1950 Today's Date: 12/02/2023   History of Present Illness Pt is 73 yo male who returns on 11/29/23 from [facility] after falling on L side where he just had hip sx. Xray of pelvis shows stable hip, CT of head showed acute IPH of L frontal lobe and bilateral small SAH. Cognitive status declined and pt found to have L frontal hematoma with extension into 4th ventricle.  PMH: HTN, recurrent mechanical falls, protein calorie malnutrition, DM2, CKD, and prostate cancer 2014, L foot fx with AFO,  L THA 11/16/23, anxiety, dementia    PT Comments  Pt lethargic today and when stimulated became agitated and avoidant of all mobility. Pt difficult to motivate yesterday but not physically resistant to mobilize whereas today he was. Came to sitting EOB with max A +2 but needed max A to maintain sitting due to posterior left lean. Pt would not allow transfer attempt or ROM to extremities. Assisted pt back to comfortable position in bed. Wife present. PT will continue to follow.    If plan is discharge home, recommend the following: Assistance with cooking/housework;Direct supervision/assist for medications management;Direct supervision/assist for financial management;Assist for transportation;Help with stairs or ramp for entrance;Supervision due to cognitive status;A lot of help with walking and/or transfers;A lot of help with bathing/dressing/bathroom   Can travel by private vehicle     No  Equipment Recommendations  None recommended by PT    Recommendations for Other Services Rehab consult     Precautions / Restrictions Precautions Precautions: Fall;Posterior Hip (posterior hip from 11/16/23) Precaution Booklet Issued: No Recall of Precautions/Restrictions: Impaired Required Braces or Orthoses: Other Brace Other Brace: L AFO Restrictions Weight Bearing Restrictions Per Provider Order: No LLE Weight Bearing Per  Provider Order: Weight bearing as tolerated     Mobility  Bed Mobility Overal bed mobility: Needs Assistance Bed Mobility: Supine to Sit, Sit to Supine     Supine to sit: Max assist, +2 for physical assistance Sit to supine: Supervision   General bed mobility comments: pt came to sitting EOB with max A +2 but pt maintained posterior and L lean throughout and was becoming more agitated with increased time up. Let him return himself to supine and he was able to scoot shoulders and get BLE's into bed without assist. Tot A +2 to scoot to [facility].    Transfers                   General transfer comment: could not attempt today due to pt agitation    Ambulation/Gait         Gait velocity: Decreased     General Gait Details: unable today   Stairs             Wheelchair Mobility     Tilt Bed    Modified Rankin (Stroke Patients Only)       Balance Overall balance assessment: Needs assistance Sitting-balance support: Bilateral upper extremity supported, Feet supported Sitting balance-Leahy Scale: Zero Sitting balance - Comments: max A needed to maintain sitting Postural control: Posterior lean, Left lateral lean                                  Communication Communication Communication: Impaired  Cognition Arousal: Lethargic Behavior During Therapy: Agitated   PT - Cognitive impairments: Difficult to assess Difficult to assess due to: Impaired communication  PT - Cognition Comments: pt did not speak today except one time when he clearly said "get off me" when trying to put mitt back on his hand. Did not follow any commands, resisted all mobility Following commands: Impaired Following commands impaired:  (did not follow)    Cueing Cueing Techniques: Verbal cues, Gestural cues, Tactile cues, Visual cues  Exercises      General Comments General comments (skin integrity, edema, etc.): attempted ROM to BUE's due  to pt keeping both drawn into face/ chest but pt not allowing. VSS      Pertinent Vitals/Pain Pain Assessment Pain Assessment: Faces Faces Pain Scale: Hurts little more Facial Expression: Tense Body Movements: Protection Muscle Tension: Tense, rigid Compliance with ventilator (intubated pts.): N/A Vocalization (extubated pts.): N/A CPOT Total: 3 Pain Location: generalized Pain Descriptors / Indicators: Discomfort Pain Intervention(s): Monitored during session    Home Living                          Prior Function            PT Goals (current goals can now be found in the care plan section) Acute Rehab PT Goals Patient Stated Goal: None stated PT Goal Formulation: With patient/family Time For Goal Achievement: 12/15/23 Potential to Achieve Goals: Good Progress towards PT goals: Not progressing toward goals - comment (agitation)    Frequency    Min 1X/week      PT Plan      Co-evaluation              AM-PAC PT "6 Clicks" Mobility   Outcome Measure  Help needed turning from your back to your side while in a flat bed without using bedrails?: A Lot Help needed moving from lying on your back to sitting on the side of a flat bed without using bedrails?: A Lot Help needed moving to and from a bed to a chair (including a wheelchair)?: Total Help needed standing up from a chair using your arms (e.g., wheelchair or bedside chair)?: Total Help needed to walk in hospital room?: Total Help needed climbing 3-5 steps with a railing? : Total 6 Click Score: 8    End of Session   Activity Tolerance: Treatment limited secondary to agitation Patient left: with call bell/phone within reach;with family/visitor present;in bed;with bed alarm set Nurse Communication: Mobility status PT Visit Diagnosis: Unsteadiness on feet (R26.81);History of falling (Z91.81);Difficulty in walking, not elsewhere classified (R26.2)     Time: 6213-0865 PT Time Calculation (min)  (ACUTE ONLY): 18 min  Charges:    $Therapeutic Activity: 8-22 mins PT General Charges $$ ACUTE PT VISIT: 1 Visit                     [name], PT  Acute Rehab Services Secure chat preferred Office [phone]    Lawana Chambers Andi Mahaffy 12/02/2023, 11:28 AM

## 2023-12-02 NOTE — TOC CM/SW Note (Addendum)
Transition of Care [facility]) - Inpatient Brief Assessment   Patient Details  Name: William Todd MRN: [id number] Date of Birth: September 20, 1951  [facility] CM/SW Contact:    Mearl Latin, LCSW Phone Number: 12/02/2023, 5:47 PM   Clinical Narrative: Patient admitted from [facility]. TOC following for rehab needs.    Transition of Care Asessment: Insurance and Status: Insurance coverage has been reviewed Patient has primary care physician: Yes Home environment has been reviewed: From home Prior level of function:: Independent Prior/Current Home Services: No current home services Social Drivers of Health Review: SDOH reviewed no interventions necessary Readmission risk has been reviewed: Yes Transition of care needs: transition of care needs identified, TOC will continue to follow

## 2023-12-02 NOTE — Progress Notes (Signed)
Patient ID: William Todd, male   DOB: 1951/05/20, 73 y.o.   MRN: [id number] Vital signs are stable.  Patient is alert and arousable.  His CT was reviewed with Dr. Meredith Pel today.  Concern for enlarging clot.  Possible consideration for surgery but if his level of consciousness is maintained I would certainly hold off.  I will discuss with his wife tomorrow morning.

## 2023-12-02 NOTE — Progress Notes (Signed)
SLP Cancellation Note  Patient Details Name: William Todd MRN: [id number] DOB: 1951-01-15   Cancelled treatment:       Reason Eval/Treat Not Completed: Medical issues which prohibited therapy. Per chart and conversation with RN, pt not as interactive today and not appropriate for SLP. Will f/u on subsequent date.     Mahala Menghini., M.A. CCC-SLP Acute Rehabilitation Services Office [phone]  Secure chat preferred  12/02/2023, 2:08 PM

## 2023-12-02 NOTE — Progress Notes (Addendum)
STROKE TEAM PROGRESS NOTE   SUBJECTIVE (INTERVAL HISTORY) His RN is at the bedside. Later I met wife at the bedside. Overall his condition is gradually worsening. He is nonverbal today, eyes closed and resisted forced opening, b/I UE flexion posturing and resisted extension. B/l flexion posturing but able to have passive extension. CTA no spot sign and CT repeat showed stable large ICH but increased midline shift.    OBJECTIVE Temp:  [98.4 F (36.9 C)-99.9 F (37.7 C)] 98.4 F (36.9 C) (02/18 1200) Pulse Rate:  [67-99] 67 (02/18 1300) Resp:  [13-32] 19 (02/18 1300) BP: (131-160)/(62-112) 146/63 (02/18 1300) SpO2:  [83 %-100 %] 100 % (02/18 1300) Weight:  [61.9 kg] 61.9 kg (02/18 0702)  Recent Labs  Lab 12/01/23 1958 12/01/23 2352 12/02/23 0343 12/02/23 0737 12/02/23 1134  GLUCAP 172* 178* 207* 222* 197*   Recent Labs  Lab 11/29/23 2342 11/30/23 0400 12/01/23 0513 12/01/23 1610 12/02/23 0624  NA 139 141 140  --  134*  K 4.9 4.6 4.3  --  3.9  CL 103 105 104  --  97*  CO2 25 25 26   --  24  GLUCOSE 262* 154* 141*  --  215*  BUN 33* 29* 18  --  19  CREATININE 1.87* 1.57* 1.29*  --  1.17  CALCIUM 9.3 9.0 9.2  --  8.6*  MG  --   --   --  1.7 1.7  PHOS  --   --   --  3.7 3.4   Recent Labs  Lab 11/29/23 2342 11/30/23 0400  AST 43* 43*  ALT 51* 45*  ALKPHOS 89 90  BILITOT 0.6 0.6  PROT 6.6 6.1*  ALBUMIN 3.1* 2.9*   Recent Labs  Lab 11/29/23 2342 11/30/23 0400 12/01/23 0513 12/02/23 0624  WBC 10.5 9.9 8.6 8.9  NEUTROABS 8.9*  --   --   --   HGB 9.6* 8.7* 8.0* 8.0*  HCT 30.0* 26.7* 25.0* 23.9*  MCV 94.6 94.0 93.3 90.9  PLT 212 203 154 164   No results for input(s): "CKTOTAL", "CKMB", "CKMBINDEX", "TROPONINI" in the last 168 hours. Recent Labs    11/30/23 0400  LABPROT 15.1  INR 1.2   Recent Labs    11/30/23 0428 12/01/23 1731  COLORURINE YELLOW RED*  LABSPEC 1.012 TEST NOT REPORTED DUE TO COLOR INTERFERENCE OF URINE PIGMENT  PHURINE 5.0 TEST NOT  REPORTED DUE TO COLOR INTERFERENCE OF URINE PIGMENT  GLUCOSEU >=500* TEST NOT REPORTED DUE TO COLOR INTERFERENCE OF URINE PIGMENT*  HGBUR SMALL* TEST NOT REPORTED DUE TO COLOR INTERFERENCE OF URINE PIGMENT*  BILIRUBINUR NEGATIVE TEST NOT REPORTED DUE TO COLOR INTERFERENCE OF URINE PIGMENT*  KETONESUR NEGATIVE TEST NOT REPORTED DUE TO COLOR INTERFERENCE OF URINE PIGMENT*  PROTEINUR NEGATIVE TEST NOT REPORTED DUE TO COLOR INTERFERENCE OF URINE PIGMENT*  NITRITE NEGATIVE TEST NOT REPORTED DUE TO COLOR INTERFERENCE OF URINE PIGMENT*  LEUKOCYTESUR NEGATIVE TEST NOT REPORTED DUE TO COLOR INTERFERENCE OF URINE PIGMENT*       Component Value Date/Time   CHOL 111 12/02/2023 0624   TRIG 63 12/02/2023 0624   HDL 41 12/02/2023 0624   CHOLHDL 2.7 12/02/2023 0624   VLDL 13 12/02/2023 0624   LDLCALC 57 12/02/2023 0624   Lab Results  Component Value Date   HGBA1C 6.1 (H) 12/02/2023   No results found for: "LABOPIA", "COCAINSCRNUR", "LABBENZ", "AMPHETMU", "THCU", "LABBARB"  No results for input(s): "ETH" in the last 168 hours.  I have personally reviewed the radiological images  below and agree with the radiology interpretations.  CT HEAD WO CONTRAST ( ) Result Date: 12/02/2023 CLINICAL DATA:  74 year old male with multifocal intracranial hemorrhage status post fall. Hemorrhage progression after presentation. EXAM: CT HEAD WITHOUT CONTRAST TECHNIQUE: Contiguous axial images were obtained from the base of the skull through the vertex without intravenous contrast. RADIATION DOSE REDUCTION: This exam was performed according to the departmental dose-optimization program which includes automated exposure control, adjustment of the mA and/or kV according to patient size and/or use of iterative reconstruction technique. COMPARISON:  CTA head and neck this morning. Head CT yesterday and earlier. FINDINGS: Brain: Large volume mixed density but mostly hyperdense intra-axial hemorrhage in the anterior left frontal  lobe encompasses 78 by 44 x 63 mm (AP by transverse by CC) versus 79 x 45 x 61 mm yesterday when measured with the same technique. Volume estimated at 108 mL, versus 105 mL on 11/30/2023, and hemorrhage size and configuration appears visually stable. Superimposed scattered bilateral subarachnoid blood and a moderate volume of mostly now layering lateral intraventricular blood also appears stable. Rightward midline shift at the anterior septum pellucidum of 7-8 mm has increased from 5-6 mm on 11/30/2023. Ventricle size and configuration is stable. Stable basilar cistern patency. Superimposed chronic right PCA territory encephalomalacia and confluent bilateral cerebral white matter hypodensity. Vascular: Calcified atherosclerosis at the skull base. No suspicious intracranial vascular hyperdensity. Skull: Stable, intact. Sinuses/Orbits: Visualized paranasal sinuses and mastoids are stable and well aerated. Other: Stable, negative orbit and scalp soft tissues. IMPRESSION: 1. Large volume anterior frontal lobe intra-axial hemorrhage does not appear significantly changed in size or configuration since 11/30/2023. Using largest orthogonal dimensions, estimated blood volume is 108 mL now vs. 105 mL then. 2. Intracranial mass effect with mildly increased rightward midline shift since 11/30/2023, now 7-8 mm. Basilar cisterns remain patent. 3. Stable bilateral SAH and IVH. Stable ventricle size and configuration. 4. Chronic right PCA territory infarct. Nonspecific white matter changes. Electronically Signed   By: Odessa Fleming M.D.   On: 12/02/2023 11:47   CT ANGIO HEAD NECK W WO CM Result Date: 12/02/2023 CLINICAL DATA:  73 year old male with multifocal intracranial hemorrhage status post fall. EXAM: CT ANGIOGRAPHY HEAD AND NECK TECHNIQUE: Multidetector CT imaging of the head and neck was performed using the standard protocol during bolus administration of intravenous contrast. Multiplanar CT image reconstructions and MIPs were  obtained to evaluate the vascular anatomy. Carotid stenosis measurements (when applicable) are obtained utilizing NASCET criteria, using the distal internal carotid diameter as the denominator. RADIATION DOSE REDUCTION: This exam was performed according to the departmental dose-optimization program which includes automated exposure control, adjustment of the mA and/or kV according to patient size and/or use of iterative reconstruction technique. CONTRAST:  75mL OMNIPAQUE IOHEXOL 350 MG/ML SOLN COMPARISON:  Head CTs 12/01/2023 and earlier. Brain MRI 11/15/2023. FINDINGS: CTA NECK Skeleton: Cervical spine degeneration. 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 No acute osseous abnormality identified. Upper chest: Upper lungs are clear. Negative visible superior mediastinum. Other neck: Neck soft tissue spaces appear within normal limits. Aortic arch: Calcified aortic atherosclerosis.  3 vessel arch. Right carotid system: No significant brachiocephalic artery or or right CCA origin atherosclerosis. Soft and calcified plaque of the right CCA before the bifurcation without stenosis. Bulky calcified atherosclerosis of the proximal right ICA beginning at the origin but most pronounced at the bulb. Subsequent distal bulb stenosis is numerically estimated at 60 % with respect to the distal vessel (series 7, image 98 and series 9,  image 155). Right ICA remains patent to the skull base. Left carotid system: Minimal left CCA atherosclerosis before the bifurcation. Soft and calcified plaque at the left ICA origin and more pronounced at the bulb. Up to 50 % stenosis with respect to the distal vessel. Additional calcified plaque of the left ICA just below the skull base without stenosis. Vertebral arteries: Mild proximal right subclavian atherosclerosis without stenosis. Calcified plaque at the right vertebral artery origin with only mild stenosis on series 8, image 145. Right  vertebral is patent to the skull base with no significant plaque or stenosis. Proximal left subclavian artery soft and calcified plaque without stenosis. Mostly soft plaque at the left vertebral artery origin with up to moderate origin stenosis on series 8, image 135. Left vertebral remains patent and is fairly codominant in the neck. Mild left V3 segment calcified plaque with no additional significant stenosis to the skull base. CTA HEAD Posterior circulation: Bilateral V4 segment atherosclerosis. Bulky and severe right V4 calcified plaque and stenosis on series 5, image 145. The right vertebrobasilar junction remains patent. Contralateral moderate left V4 segment stenosis proximal to the left PICA origin which remains normal. Right PICA origin is patent. Patent left vertebrobasilar junction. Patent basilar artery without stenosis. Fetal type bilateral PCA origins. Patent SCA origins. Bilateral PCA branches are within normal limits. Anterior circulation: Both ICA siphons are patent. Left siphon cavernous segment calcified plaque is moderate with mild to moderate stenosis at the anterior genu. Normal left posterior communicating artery origin. Right ICA cavernous through supraclinoid segment calcified plaque is moderate with mild to moderate supraclinoid stenosis. Normal right posterior communicating artery origin. Patent carotid termini. Patent MCA origins. Dominant right and diminutive or absent left ACA A1 segments. Anterior communicating artery and bilateral ACA branches are within normal limits. Left MCA M1 segment and bifurcation are patent without stenosis. There is mild mass effect on the left MCA branches which otherwise appear within normal limits. Right MCA M1 segment and trifurcation are patent without stenosis. Right MCA branches are within normal limits. No CTA spot sign or abnormal vascularity associated with the left anterior superior frontal lobe intra-axial hemorrhage. Other findings: Grossly  stable intracranial hemorrhage, mass effect from the head CT yesterday. Venous sinuses: Early contrast timing. Superior sagittal sinus, torcula, straight sinus, vein of Galen and internal cerebral veins, dominant appearing right transverse and sigmoid venous sinuses appear to be enhancing and patent. Anatomic variants: Fetal type bilateral PCA origins. Dominant right ACA A1. Review of the MIP images confirms the above findings IMPRESSION: 1. Negative for CTA spot sign in association with acute intracranial hemorrhage. Mild mass effect on left MCA vasculature. Negative for intracranial aneurysm, vascular malformation, or large vessel occlusion. 2. Atherosclerosis in the head and neck notable for: - 60% stenosis of the Right ICA bulb. - Moderate To Severe bilateral distal vertebral artery stenosis. - Moderate Left Vertebral Artery origin stenosis. - mild to moderate bilateral ICA siphon stenosis. 3.  Aortic Atherosclerosis (ICD10-I70.0). Electronically Signed   By: Odessa Fleming M.D.   On: 12/02/2023 06:41   DG Abd Portable 1V Result Date: 12/01/2023 CLINICAL DATA:  NG placement. EXAM: PORTABLE ABDOMEN - 1 VIEW COMPARISON:  CT abdomen pelvis dated 07/01/2013. FINDINGS: Feeding tube with tip in the left upper abdomen likely in the region of the gastric fundus. Large amount of stool throughout the colon. IMPRESSION: Feeding tube with tip in the region of the gastric fundus. Electronically Signed   By: Elgie Collard M.D.   On:  12/01/2023 18:55   CT HEAD WO CONTRAST ( ) Result Date: 12/01/2023 CLINICAL DATA:  Follow-up of intracranial hemorrhage. EXAM: CT HEAD WITHOUT CONTRAST TECHNIQUE: Contiguous axial images were obtained from the base of the skull through the vertex without intravenous contrast. RADIATION DOSE REDUCTION: This exam was performed according to the departmental dose-optimization program which includes automated exposure control, adjustment of the mA and/or kV according to patient size and/or use of  iterative reconstruction technique. COMPARISON:  None Available. FINDINGS: Brain: Redemonstrated intraparenchymal hematoma in the anterior left frontal lobe which measures 7.9 x 4.9 x 3.6 cm, previously measuring 7.8 x 5.2 x 3.4 cm when remeasured in a similar manner. Similar surrounding edema and associated mass effect. Approximately 5 mm rightward midline shift anteriorly similar to prior. Extension of hemorrhage into the adjacent left frontal horn with similar appearance of blood products in the left lateral ventricle. Increased blood products within the atrium and occipital horn of the right lateral ventricle. Slightly decreased blood products in the third ventricle. Small focus of subdural hemorrhage along the anterior falx. Scattered areas of subarachnoid hemorrhage along the right frontal lobe and right sylvian fissure again noted. Remote infarct in the right occipital lobe. Vascular: No hyperdense vessel or unexpected calcification. Skull: Normal. Negative for fracture or focal lesion. Sinuses/Orbits: No acute finding. Other: Mastoid air cells are clear. IMPRESSION: Similar appearance of intraparenchymal hematoma in the left frontal lobe with associated mass effect and 5 mm rightward midline shift. Additional small multi compartment intracranial hemorrhage as above. Slightly increased blood products within the atrium and occipital horn of the right lateral ventricle. Electronically Signed   By: Emily Filbert M.D.   On: 12/01/2023 13:34   ECHOCARDIOGRAM COMPLETE Result Date: 11/30/2023    ECHOCARDIOGRAM REPORT   Patient Name:   KAIRON SHOCK Date of Exam: 11/30/2023 Medical Rec #:  [id number]        Height:       72.0 in Accession #:    [id number]       Weight:       147.0 lb Date of Birth:  [birth year]         BSA:          1.869 m Patient Age:    72 years         BP:           129/68 mmHg Patient Gender: M                HR:           77 bpm. Exam Location:  Inpatient Procedure: 2D Echo (Both Spectral and  Color Flow Doppler were utilized during            procedure). Indications:    intracerebral hemorrhage  History:        Patient has prior history of Echocardiogram examinations, most                 recent 01/02/2013. Risk Factors:Hypertension, Diabetes and                 Dyslipidemia.  Sonographer:    Delcie Roch RDCS Referring Phys: [phone] WHITNEY D HARRIS IMPRESSIONS  1. Left ventricular ejection fraction, by estimation, is 60 to 65%. The left ventricle has normal function. The left ventricle has no regional wall motion abnormalities. Left ventricular diastolic parameters were normal.  2. Right ventricular systolic function is normal. The right ventricular size is normal. There is mildly elevated pulmonary artery systolic pressure.  3. The mitral valve is degenerative. Mild mitral valve regurgitation. No evidence of mitral stenosis.  4. The aortic valve is tricuspid. There is mild calcification of the aortic valve. Aortic valve regurgitation is not visualized. Aortic valve sclerosis is present, with no evidence of aortic valve stenosis.  5. The inferior vena cava is normal in size with greater than 50% respiratory variability, suggesting right atrial pressure of 3 mmHg. FINDINGS  Left Ventricle: Left ventricular ejection fraction, by estimation, is 60 to 65%. The left ventricle has normal function. The left ventricle has no regional wall motion abnormalities. Strain imaging was not performed. The left ventricular internal cavity  size was normal in size. There is no left ventricular hypertrophy. Left ventricular diastolic parameters were normal. Right Ventricle: The right ventricular size is normal. No increase in right ventricular wall thickness. Right ventricular systolic function is normal. There is mildly elevated pulmonary artery systolic pressure. The tricuspid regurgitant velocity is 2.89  m/s, and with an assumed right atrial pressure of 3 mmHg, the estimated right ventricular systolic pressure is  36.4 mmHg. Left Atrium: Left atrial size was normal in size. Right Atrium: Right atrial size was normal in size. Pericardium: There is no evidence of pericardial effusion. Mitral Valve: The mitral valve is degenerative in appearance. There is mild calcification of the anterior mitral valve leaflet(s). Mild mitral valve regurgitation. No evidence of mitral valve stenosis. Tricuspid Valve: The tricuspid valve is grossly normal. Tricuspid valve regurgitation is mild . No evidence of tricuspid stenosis. Aortic Valve: The aortic valve is tricuspid. There is mild calcification of the aortic valve. Aortic valve regurgitation is not visualized. Aortic valve sclerosis is present, with no evidence of aortic valve stenosis. Pulmonic Valve: The pulmonic valve was grossly normal. Pulmonic valve regurgitation is not visualized. No evidence of pulmonic stenosis. Aorta: The aortic root and ascending aorta are structurally normal, with no evidence of dilitation. Venous: The inferior vena cava is normal in size with greater than 50% respiratory variability, suggesting right atrial pressure of 3 mmHg. IAS/Shunts: The atrial septum is grossly normal. Additional Comments: 3D imaging was not performed.  LEFT VENTRICLE PLAX 2D LVIDd:         3.20 cm   Diastology LVIDs:         1.70 cm   LV e' medial:    8.16 cm/s LV PW:         1.20 cm   LV E/e' medial:  10.7 LV IVS:        1.00 cm   LV e' lateral:   11.20 cm/s LVOT diam:     2.40 cm   LV E/e' lateral: 7.8 LV SV:         115 LV SV Index:   61 LVOT Area:     4.52 cm  RIGHT VENTRICLE             IVC RV Basal diam:  2.80 cm     IVC diam: 1.70 cm RV S prime:     12.90 cm/s TAPSE (M-mode): 2.2 cm LEFT ATRIUM             Index        RIGHT ATRIUM           Index LA diam:        3.30 cm 1.77 cm/m   RA Area:     13.00 cm LA Vol (A2C):   45.6 ml 24.39 ml/m  RA Volume:   29.50 ml  15.78 ml/m  LA Vol (A4C):   39.1 ml 20.92 ml/m LA Biplane Vol: 43.8 ml 23.43 ml/m  AORTIC VALVE LVOT Vmax:    125.00 cm/s LVOT Vmean:  83.200 cm/s LVOT VTI:    0.254 m  AORTA Ao Root diam: 3.30 cm Ao Asc diam:  3.20 cm MITRAL VALVE               TRICUSPID VALVE MV Area (PHT): 3.37 cm    TR Peak grad:   33.4 mmHg MV Decel Time: 225 msec    TR Vmax:        289.00 cm/s MR Peak grad: 113.2 mmHg MR Mean grad: 73.0 mmHg    SHUNTS MR Vmax:      532.00 cm/s  Systemic VTI:  0.25 m MR Vmean:     403.0 cm/s   Systemic Diam: 2.40 cm MV E velocity: 87.30 cm/s MV A velocity: 70.40 cm/s MV E/A ratio:  1.24 Lennie Odor MD Electronically signed by Lennie Odor MD Signature Date/Time: 11/30/2023/8:57:24 PM    Final    CT Head Wo Contrast Result Date: 11/30/2023 CLINICAL DATA:  Follow-up head trauma EXAM: CT HEAD WITHOUT CONTRAST TECHNIQUE: Contiguous axial images were obtained from the base of the skull through the vertex without intravenous contrast. RADIATION DOSE REDUCTION: This exam was performed according to the departmental dose-optimization program which includes automated exposure control, adjustment of the mA and/or kV according to patient size and/or use of iterative reconstruction technique. COMPARISON:  Head CT from the same day at 2 a.m. FINDINGS: Brain: Growing hematoma in the anterior left frontal lobe, hemorrhagic area measuring 7.3 x 5 x 3.4 cm. The hemorrhage has now decompressed into the left lateral ventricle with tracking to the level of the fourth ventricle. No hydrocephalus. Patchy subarachnoid hemorrhage along the frontal lobes and sylvian fissures which appears similar. The hemorrhage and edema causes progressive swelling with anterior midline shift measuring 5 mm. Chronic right occipital infarct. Low-density in the cerebral white matter from chronic small vessel ischemia. Vascular: No hyperdense vessel or unexpected calcification. Skull: No acute finding Sinuses/Orbits: No evidence of injury Critical Value/emergent results were called by telephone at the time of interpretation on 11/30/2023 at 8:45 am to  provider [place], who verbally acknowledged these results. IMPRESSION: Progressive left frontal hematoma now measuring 60 cc with new intraventricular extension reaching the fourth ventricle. Patchy bilateral subarachnoid hemorrhage appears similar to prior. The growing hematoma and edema causes anterior midline shift by 5 mm. No hydrocephalus. Electronically Signed   By: Tiburcio Pea M.D.   On: 11/30/2023 08:46   CT Head Wo Contrast Result Date: 11/30/2023 CLINICAL DATA:  Head trauma, minor (Age >= 65y); Neck trauma (Age >= 65y) EXAM: CT HEAD WITHOUT CONTRAST CT CERVICAL SPINE WITHOUT CONTRAST TECHNIQUE: Multidetector CT imaging of the head and cervical spine was performed following the standard protocol without intravenous contrast. Multiplanar CT image reconstructions of the cervical spine were also generated. RADIATION DOSE REDUCTION: This exam was performed according to the departmental dose-optimization program which includes automated exposure control, adjustment of the mA and/or kV according to patient size and/or use of iterative reconstruction technique. COMPARISON:  MRI head November 15, 2023. FINDINGS: CT HEAD FINDINGS Brain: Acute intraparenchymal hemorrhage in the left frontal lobe measuring 2.6 cm with overlying extra-axial extension. Small volume of multifocal subarachnoid hemorrhage bilaterally along the right frontal convexity, left frontal convexity and layering within the left sylvian fissure. No midline shift. No hydrocephalus. Remote right occipital infarct. Vascular: Calcific atherosclerosis.  No dense  vessel. Skull: No acute fracture. Sinuses/Orbits: Clear sinuses.  No acute orbital findings. Other: No mastoid effusions. CT CERVICAL SPINE FINDINGS Alignment: No substantial sagittal subluxation. Skull base and vertebrae: No acute fracture. Soft tissues and spinal canal: No prevertebral fluid or swelling. No visible canal hematoma. Disc levels:  Moderate multilevel degenerative change.  Upper chest: Visualized lung apices are clear. IMPRESSION: 1. Acute intraparenchymal hemorrhage in left frontal lobe with multifocal bilateral small volume subarachnoid hemorrhage. No midline shift. 2. No acute fracture or traumatic malalignment in the cervical spine. Findings discussed with Dr. Clayborne Dana via telephone at 2:19 a.m. Electronically Signed   By: Feliberto Harts M.D.   On: 11/30/2023 02:21   CT Cervical Spine Wo Contrast Result Date: 11/30/2023 CLINICAL DATA:  Head trauma, minor (Age >= 65y); Neck trauma (Age >= 65y) EXAM: CT HEAD WITHOUT CONTRAST CT CERVICAL SPINE WITHOUT CONTRAST TECHNIQUE: Multidetector CT imaging of the head and cervical spine was performed following the standard protocol without intravenous contrast. Multiplanar CT image reconstructions of the cervical spine were also generated. RADIATION DOSE REDUCTION: This exam was performed according to the departmental dose-optimization program which includes automated exposure control, adjustment of the mA and/or kV according to patient size and/or use of iterative reconstruction technique. COMPARISON:  MRI head November 15, 2023. FINDINGS: CT HEAD FINDINGS Brain: Acute intraparenchymal hemorrhage in the left frontal lobe measuring 2.6 cm with overlying extra-axial extension. Small volume of multifocal subarachnoid hemorrhage bilaterally along the right frontal convexity, left frontal convexity and layering within the left sylvian fissure. No midline shift. No hydrocephalus. Remote right occipital infarct. Vascular: Calcific atherosclerosis.  No dense vessel. Skull: No acute fracture. Sinuses/Orbits: Clear sinuses.  No acute orbital findings. Other: No mastoid effusions. CT CERVICAL SPINE FINDINGS Alignment: No substantial sagittal subluxation. Skull base and vertebrae: No acute fracture. Soft tissues and spinal canal: No prevertebral fluid or swelling. No visible canal hematoma. Disc levels:  Moderate multilevel degenerative change. Upper  chest: Visualized lung apices are clear. IMPRESSION: 1. Acute intraparenchymal hemorrhage in left frontal lobe with multifocal bilateral small volume subarachnoid hemorrhage. No midline shift. 2. No acute fracture or traumatic malalignment in the cervical spine. Findings discussed with Dr. Clayborne Dana via telephone at 2:19 a.m. Electronically Signed   By: Feliberto Harts M.D.   On: 11/30/2023 02:21   DG Chest Portable 1 View Result Date: 11/30/2023 CLINICAL DATA:  Fall EXAM: PORTABLE CHEST 1 VIEW COMPARISON:  03/05/2012 FINDINGS: There is asymmetric airspace infiltrate within the right mid lung zone, possibly infectious or posttraumatic given the history of recent trauma. No pneumothorax or pleural effusion. Cardiac size within normal limits. Pulmonary vascularity is normal. No acute bone abnormality. IMPRESSION: 1. Asymmetric airspace infiltrate within the right mid lung zone, possibly infectious or posttraumatic given the history of recent trauma. Electronically Signed   By: Helyn Numbers M.D.   On: 11/30/2023 01:18   DG Pelvis Portable Result Date: 11/30/2023 CLINICAL DATA:  Fall EXAM: PORTABLE PELVIS 1-2 VIEWS COMPARISON:  None Available. FINDINGS: Left total hip arthroplasty has been performed. No acute fracture or dislocation. Mild right hip degenerative arthritis. Vascular calcifications noted. Soft tissues are otherwise unremarkable. IMPRESSION: 1. Left total hip arthroplasty. No acute fracture or dislocation. Electronically Signed   By: Helyn Numbers M.D.   On: 11/30/2023 01:17   DG HIP UNILAT W OR W/O PELVIS 2-3 VIEWS LEFT Result Date: 11/16/2023 CLINICAL DATA:  [id number] History of left hip hemiarthroplasty [id number] EXAM: DG HIP (WITH OR WITHOUT PELVIS) 2-3V LEFT COMPARISON:  11/15/2023  FINDINGS: Interval left hip arthroplasty, components projecting in expected location. No fracture or dislocation. Surgical clips project over the pubic bones. Patchy iliofemoral arterial calcifications. IMPRESSION:  Left hip arthroplasty, without apparent complication. Electronically Signed   By: Corlis Leak M.D.   On: 11/16/2023 16:11   MR BRAIN WO CONTRAST Result Date: 11/15/2023 CLINICAL DATA:  Abnl CT Head, evaluate for pontine infarct EXAM: MRI HEAD WITHOUT CONTRAST TECHNIQUE: Multiplanar, multiecho pulse sequences of the brain and surrounding structures were obtained without intravenous contrast. COMPARISON:  CT head from today. FINDINGS: Brain: No acute infarction, hemorrhage, hydrocephalus, extra-axial collection or mass lesion. Remote right PCA territory infarct. Moderate T2/FLAIR hyperintensities in the white matter, compatible with chronic microvascular ischemic disease. Vascular: Major arterial flow voids are maintained at the skull base. Skull and upper cervical spine: Normal marrow signal. Sinuses/Orbits: Negative. Other: No sizable mastoid effusions. IMPRESSION: 1. No evidence of acute intracranial abnormality. 2. Remote right PCA territory infarct. Electronically Signed   By: Feliberto Harts M.D.   On: 11/15/2023 22:38   CT Head Wo Contrast Result Date: 11/15/2023 CLINICAL DATA:  Altered mental status, fall EXAM: CT HEAD WITHOUT CONTRAST TECHNIQUE: Contiguous axial images were obtained from the base of the skull through the vertex without intravenous contrast. RADIATION DOSE REDUCTION: This exam was performed according to the departmental dose-optimization program which includes automated exposure control, adjustment of the mA and/or kV according to patient size and/or use of iterative reconstruction technique. COMPARISON:  None Available. FINDINGS: Brain: No acute territorial infarction, hemorrhage or intracranial mass. 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 Atrophy and advanced chronic small vessel ischemic changes of the white matter. Hypodensity at the pons, series 2, image 11 and sagittal series 6 image 26. Ventricles are nonenlarged Vascular: No hyperdense  vessels. Carotid and vertebral calcifications. Skull: Normal. Negative for fracture or focal lesion. Sinuses/Orbits: No acute finding. Other: None IMPRESSION: 1. Negative for acute intracranial hemorrhage or mass. 2. Hypodensity at the pons, though some of this may be related to artifact, the finding is rounded with some sparing of peripheral tissue and acute or subacute pontine infarct cannot be excluded. MRI is recommended for further evaluation. 3. Atrophy and chronic small vessel ischemic changes of the white matter Electronically Signed   By: Jasmine Pang M.D.   On: 11/15/2023 18:51   DG Ankle Complete Left Result Date: 11/15/2023 CLINICAL DATA:  Fall with ankle pain EXAM: LEFT ANKLE COMPLETE - 3+ VIEW COMPARISON:  01/30/2012 FINDINGS: Lateral plate and multiple fixating screws with additional medial malleolar fixating screws. Chronic postsurgical, posttraumatic and post infectious changes involving the distal tibia, distal fibula and tibiotalar joint. Fused appearance across the tibiotalar joint and probable subtalar fusion as well. No definite acute fracture is seen. IMPRESSION: Chronic postsurgical, posttraumatic and post infectious changes involving the distal tibia, distal fibula and tibiotalar joint. No definite acute osseous abnormality. Electronically Signed   By: Jasmine Pang M.D.   On: 11/15/2023 18:09   DG Foot Complete Left Result Date: 11/15/2023 CLINICAL DATA:  Larey Seat 2 weeks ago with left foot pain. EXAM: LEFT FOOT - COMPLETE 3+ VIEW COMPARISON:  Left ankle 01/30/2012 FINDINGS: Diffuse bone demineralization. Degenerative changes in the interphalangeal joints, metatarsal-phalangeal, tarsometatarsal, and interphalangeal joints. Postoperative fusion of the ankle joint with plate and screw fixation. No evidence of acute fracture or dislocation. Soft tissues are unremarkable. IMPRESSION: 1. No acute bony abnormalities. 2. Degenerative changes in the left foot. 3. Postoperative changes with  surgical fusion of the  left ankle including plate and screw fixation Electronically Signed   By: Burman Nieves M.D.   On: 11/15/2023 18:03   DG Hip Unilat W or Wo Pelvis 2-3 Views Left Result Date: 11/15/2023 CLINICAL DATA:  Fall 2 weeks ago. Dizziness with fall forward. Left foot pain. EXAM: DG HIP (WITH OR WITHOUT PELVIS) 2-3V LEFT COMPARISON:  None Available. FINDINGS: Transverse subcapital fracture of the left femoral neck with varus angulation of the fracture fragments. No inter trochanteric involvement is identified. No dislocation at the hip joint. Pelvis appears intact. SI joints and symphysis pubis are not displaced. No destructive or expansile bone lesions. Degenerative changes in the lower lumbar spine and both hips. Vascular calcifications. IMPRESSION: Acute transverse fracture of the left femoral neck with varus angulation of the fracture fragments. Degenerative changes. Electronically Signed   By: Burman Nieves M.D.   On: 11/15/2023 18:02     PHYSICAL EXAM  Temp:  [98.4 F (36.9 C)-99.9 F (37.7 C)] 98.4 F (36.9 C) (02/18 1200) Pulse Rate:  [67-99] 67 (02/18 1300) Resp:  [13-32] 19 (02/18 1300) BP: (131-160)/(62-112) 146/63 (02/18 1300) SpO2:  [83 %-100 %] 100 % (02/18 1300) Weight:  [61.9 kg] 61.9 kg (02/18 0702)  General - well nourished, well developed, in no apparent distress.     Ophthalmologic - fundi not visualized due to noncooperation.     Cardiovascular - regular rhythm and rate   Neuro - eyes closed, resisted forced opening, nonverbal and not following commands. Not able to check eye gaze or pupil today due to resisting forced eye opening. No significant facial droop. Tongue protrusion or mouth open not cooperative. Bilateral UEs at least 4/5 bicep with flexion posturing and resisting passive extension, b/l flexion position but able to have passive extension. Sensation, coordination and gait not tested.   ASSESSMENT/PLAN Mr. CAIO DEVERA is a 73 y.o.  male with PMH of DM, HTN, HLD, prostate cancer, left foot fracture with prosthesis, dementia and recent fall with acute transverse fracture of the left femoral neck s/p hemiarthroplasty on 2/2. Post op delirium/confusion but MRI at that time no acute finding, no stroke or hemorrhage. No sign of CAA. He was put on ASA 81 bid for 4 weeks for DVT prophylaxis. He was discharged to SNF on 11/20/23. Readmitted on 2/15 with fall at SNF and left eyebrow laceration. CT head showed left frontal small ICH with bilateral small SAH, concerning for traumatic ICH with contusion. However repeat CT 2/16 showed significant enlarged hematoma on the left frontal area with multi-lobe configuration and IVH and SAH but no hydro with 5mm MLS.     ICH:  left frontal large ICH, etiology unclear, traumatic ICH vs. Spontaneous ICH. Less likely for hemorrhagic infarct CT head 2/16 left frontal small ICH with bilateral small SAH, concerning for traumatic ICH with contusion.  Repeat CT 2/16 showed significant enlarged hematoma on the left frontal area with multi-lobe configuration and IVH and SAH but no hydro with 5mm MLS.    Repeat CT 2/17 Similar appearance of intraparenchymal hematoma in the left frontal lobe with associated mass effect and 5 mm rightward midline shift. CTA head and neck - no spot sign. 60% R ICA bulb, b/l distal VA mod to severe stenosis,  mod left VA origin stenosis and mild to mod b/l ICA siphon CT repeat 2/18 large left frontal ICH 108cc vs. 2/17 105cc but with MLS of 7-8 mm.  MRI not able to tolerate so far 2D Echo  EF 60-65% LDL  57 HgbA1c 6.1 SCDs for VTE prophylaxis aspirin 81 mg daily bid prior to admission, now on No antithrombotic.  Ongoing aggressive stroke risk factor management Therapy recommendations:  CIR Disposition:  pending  Cerebral edema Repeat CT 2/17 5 mm rightward midline shift. CT repeat 2/18 MLS of 7-8 mm.  Na 140-134- Put on 3% saline @ 50 Bolus with 23.4% x 1 Na monitoring with  goal 150-155  Diabetes HgbA1c 6.1 goal < 7.0 Controlled CBG monitoring SSI DM education and close PCP follow up  Hypertension Stable On norvasc 10 BP goal < 160 Long term BP goal normotensive  Hyperlipidemia Home meds:  lipitor 40  LDL 57, goal < 70 Consider to resume lipitor on discharge  Other Stroke Risk Factors Advanced age  Other Active Problems Dementia  Recent fracture - fall with acute transverse fracture of the left femoral neck with varus angulation of the fracture fragments. Underwent hemiarthroplasty on 2/2.   Hospital day # 2  This patient is critically ill due to large frontal ICH, cerebral edema and at significant risk of neurological worsening, death form brain herniation, seizure. This patient's care requires constant monitoring of vital signs, hemodynamics, respiratory and cardiac monitoring, review of multiple databases, neurological assessment, discussion with family, other specialists and medical decision making of high complexity. I spent 40 minutes of neurocritical care time in the care of this patient. I had long discussion with wife at bedside, updated pt current condition, treatment plan and potential prognosis, and answered all the questions. She expressed understanding and appreciation.    Marvel Plan, MD PhD Stroke Neurology 12/02/2023 1:20 PM    To contact Stroke Continuity provider, please refer to [email or web address]. After hours, contact General Neurology

## 2023-12-02 NOTE — Progress Notes (Signed)
 Inpatient Rehab Admissions Coordinator:   Per therapy recommendations, patient was screened for CIR candidacy by Megan Salon, MS, CCC-SLP. At this time, Pt. is not at a level to tolerate the intensity of CIR ; however,   Pt. may have potential to progress to becoming a potential CIR candidate, so CIR admissions team will follow and monitor for progress and participation with therapies and place consult order if Pt. appears to be an appropriate candidate. Please contact me with any questions.    Megan Salon, MS, CCC-SLP Rehab Admissions Coordinator  [phone] (celll) [phone] (office)

## 2023-12-02 NOTE — Inpatient Diabetes Management (Signed)
Inpatient Diabetes Program Recommendations  AACE/ADA: New Consensus Statement on Inpatient Glycemic Control (2015)  Target Ranges:  Prepandial:   less than 140 mg/dL      Peak postprandial:   less than 180 mg/dL (1-2 hours)      Critically ill patients:  140 - 180 mg/dL   Lab Results  Component Value Date   GLUCAP 197 (H) 12/02/2023   HGBA1C 6.1 (H) 12/02/2023    Review of Glycemic Control  Latest Reference Range & Units 12/02/23 03:43 12/02/23 07:37 12/02/23 11:34  Glucose-Capillary 70 - 99 mg/dL 657 (H) 846 (H) 962 (H)   Diabetes history: DM 2 Outpatient Diabetes medications:  Januvia 50 mg daily Current orders for Inpatient glycemic control:  None  Inpatient Diabetes Program Recommendations:   Note history of DM.  Please consider adding Novolog 0-9 units q 4 hours while in the hospital.  Thanks,  Lorenza Cambridge, RN, BC-ADM Inpatient Diabetes Coordinator Pager [phone]  (8a-5p)

## 2023-12-02 NOTE — Progress Notes (Signed)
Upon assessment of patient, patient's left pupil noted to be slightly bigger than right. Left pupil is a 4, round, and brisk, and right pupil is a 3, round, and brisk. This RN spoke with NSGY PA Esperanza Richters to notify of findings. No new orders received.   Care ongoing.  William Closs, RN

## 2023-12-02 NOTE — Progress Notes (Addendum)
NAME:  William Todd, MRN:  [id number], DOB:  [birth year], LOS: 2 ADMISSION DATE:  11/29/2023, CONSULTATION DATE:  11/29/2023 REFERRING MD:  Dr. Clayborne Dana - EDP, CHIEF COMPLAINT:  SAH with IPH   History of Present Illness:  William Todd is a 73 y.o. male with a past medical history significant for HTN, recurrent mechanical falls, protein calorie malnutrition, type 2 diabetes, chronic kidney disease, and prostate cancer 2014 who presented to the ED via EMS from [facility] after suffering ground-level fall at [facility].  Workup on admission revealed acute intraparenchymal hemorrhage of the left frontal lobe with multiple bilateral small volume SAH doubt shift, neurosurgery was consulted with no surgical interventions recommended.  Patient was admitted initially per [facility].   While boarding in the emergency department patient had repeat CT scan to follow-up on ICH which revealed progressive left frontal hematoma with intraventricular extension into the fourth ventricle resulting in anterior midline shift of 5 mm, no hydrocephalus.  Neurosurgery reviewed imaging and recommended admission to ICU under PCCM for close monitoring.   Pertinent  Medical History  HTN, recurrent mechanical falls, protein calorie malnutrition, type 2 diabetes, chronic kidney disease, and prostate cancer 2014   Significant Hospital Events: Including procedures, antibiotic start and stop dates in addition to other pertinent events   2/15 presented after ground-level fall from SNF workup revealed ICH with bilateral [facility] 2/16 repeat CT while boarding in ED revealed progressive left frontal hematoma with intraventricular extension into the fourth ventricle resulting in anterior midline shift of 5 mm, no hydrocephalus.  Admission changed to ICU 2/18-CT angio  Interim History / Subjective:   No overnight events Apparently pulled cortrak out  Objective   Blood pressure (!) 160/80, pulse 70, temperature 99.6 F (37.6 C), temperature  source Axillary, resp. rate 13, weight 61.9 kg, SpO2 96%.        Intake/Output Summary (Last 24 hours) at 12/02/2023 0731 Last data filed at 12/02/2023 0500 Gross per 24 hour  Intake 1240.27 ml  Output 1801 ml  Net -560.73 ml   Filed Weights   12/01/23 1512  Weight: 61.9 kg    Examination: General: Not in distress HEENT: Moist oral mucosa Neuro: Follows intermittent commands, CV: S1-S2 appreciated PULM: Fair air entry GI: Bowel sounds appreciated Extremities: warm/dry, no edema  Skin: no rashes or lesions   Resolved Hospital Problem list     Assessment & Plan:   Left frontal intracerebral traumatic hematoma Bilateral subarachnoid hemorrhages secondary to trauma -He has about a 5 mm shift, no hydrocephalus -Continue frequent neurochecks -Neuroprotective measures -Will need to replace cortrak -Limit sedating medications  Neurology update -Bleeding appears a little worse on CTA this morning -Will be going for CT head  History of mood disorder History of memory impairment -Delirium precautions  Acute kidney injury superimposed on chronic kidney disease stage IIIb -Avoid nephrotoxic medications -Trend renal parameters  Hypertension -Was on Norvasc, Toprol-XL -Continue Lopressor IV  Type 2 diabetes -Continue SSI, CBG monitoring  Recurrent mechanical falls with a history of recent left femoral neck fracture, s/p arthroplasty 11/16/2023, recent ankle fracture -Fall precautions  Best Practice (right click and "Reselect all SmartList Selections" daily)   Diet/type: NPO; pending slp eval may start TF if core track placed DVT prophylaxis SCD Pressure ulcer(s): N/A GI prophylaxis: PPI Lines: N/A Foley:  N/A Code Status:  full code Last date of multidisciplinary goals of care discussion: pending   Labs   CBC: Recent Labs  Lab 11/29/23 2342 11/30/23 0400 12/01/23  1610 12/02/23 0624  WBC 10.5 9.9 8.6 8.9  NEUTROABS 8.9*  --   --   --   HGB 9.6* 8.7* 8.0*  8.0*  HCT 30.0* 26.7* 25.0* 23.9*  MCV 94.6 94.0 93.3 90.9  PLT 212 203 154 164    Basic Metabolic Panel: Recent Labs  Lab 11/29/23 2342 11/30/23 0400 12/01/23 0513 12/01/23 1610 12/02/23 0624  NA 139 141 140  --  134*  K 4.9 4.6 4.3  --  3.9  CL 103 105 104  --  97*  CO2 25 25 26   --  24  GLUCOSE 262* 154* 141*  --  215*  BUN 33* 29* 18  --  19  CREATININE 1.87* 1.57* 1.29*  --  1.17  CALCIUM 9.3 9.0 9.2  --  8.6*  MG  --   --   --  1.7 1.7  PHOS  --   --   --  3.7 3.4   GFR: Estimated Creatinine Clearance: 49.2 mL/min (by C-G formula based on SCr of 1.17 mg/dL). Recent Labs  Lab 11/29/23 2342 11/30/23 0400 12/01/23 0513 12/02/23 0624  WBC 10.5 9.9 8.6 8.9    Liver Function Tests: Recent Labs  Lab 11/29/23 2342 11/30/23 0400  AST 43* 43*  ALT 51* 45*  ALKPHOS 89 90  BILITOT 0.6 0.6  PROT 6.6 6.1*  ALBUMIN 3.1* 2.9*   No results for input(s): "LIPASE", "AMYLASE" in the last 168 hours. Recent Labs  Lab 11/30/23 1226  AMMONIA 14    ABG    Component Value Date/Time   TCO2 25 01/01/2013 1312     Coagulation Profile: Recent Labs  Lab 11/30/23 0400  INR 1.2    Cardiac Enzymes: No results for input(s): "CKTOTAL", "CKMB", "CKMBINDEX", "TROPONINI" in the last 168 hours.  HbA1C: Hgb A1c MFr Bld  Date/Time Value Ref Range Status  11/16/2023 06:00 AM 6.4 (H) 4.8 - 5.6 % Final    Comment:    (NOTE) Pre diabetes:          5.7%-6.4%  Diabetes:              >6.4%  Glycemic control for   <7.0% adults with diabetes   01/01/2013 04:01 PM 6.0 (H) <5.7 % Final    Comment:    (NOTE)                                                                       According to the ADA Clinical Practice Recommendations for 2011, when HbA1c is used as a screening test:  >=6.5%   Diagnostic of Diabetes Mellitus           (if abnormal result is confirmed) 5.7-6.4%   Increased risk of developing Diabetes Mellitus References:Diagnosis and Classification of Diabetes  [place],Diabetes Care,2011,34(Suppl 1):S62-S69 and Standards of Medical Care in         Diabetes - 2011,Diabetes Care,2011,34 (Suppl 1):S11-S61.    CBG: Recent Labs  Lab 12/01/23 1154 12/01/23 1541 12/01/23 1958 12/01/23 2352 12/02/23 0343  GLUCAP 143* 162* 172* 178* 207*    Review of Systems:   Unable to assess   Past Medical History:  He,  has a past medical history of Anxiety, Chronic kidney disease, Diabetes mellitus, History  of radiation therapy (09/29/13- 11/25/13), Hypertension, and Prostate cancer (HCC) (06/01/2013).   Surgical History:   Past Surgical History:  Procedure Laterality Date   CARDIAC CATHETERIZATION  01/01/2013   FRACTURE SURGERY     left foot    HIP ARTHROPLASTY Left 11/16/2023   Procedure: ARTHROPLASTY BIPOLAR HIP (HEMIARTHROPLASTY);  Surgeon: Sheral Apley, MD;  Location: [facility] OR;  Service: Orthopedics;  Laterality: Left;   LEFT HEART CATHETERIZATION WITH CORONARY ANGIOGRAM N/A 01/01/2013   Procedure: LEFT HEART CATHETERIZATION WITH CORONARY ANGIOGRAM;  Surgeon: Robynn Pane, MD;  Location: [facility] CATH LAB;  Service: Cardiovascular;  Laterality: N/A;   PROSTATE BIOPSY  06/01/13     Social History:   reports that he has never smoked. He has never used smokeless tobacco. He reports that he does not drink alcohol and does not use drugs.   Family History:  His family history includes Prostate cancer (age of onset: 72) in his brother.   Allergies No Known Allergies   The patient is critically ill with multiple organ systems failure and requires high complexity decision making for assessment and support, frequent evaluation and titration of therapies, application of advanced monitoring technologies and extensive interpretation of multiple databases. Critical Care Time devoted to patient care services described in this note independent of APP/resident time (if applicable)  is 30 minutes.   Virl Diamond MD [facility] Personal  pager: See Amion If unanswered, please page CCM On-call: #[phone]

## 2023-12-03 ENCOUNTER — Inpatient Hospital Stay (HOSPITAL_COMMUNITY): Payer: Medicare HMO

## 2023-12-03 DIAGNOSIS — R569 Unspecified convulsions: Secondary | ICD-10-CM | POA: Diagnosis not present

## 2023-12-03 DIAGNOSIS — N1832 Chronic kidney disease, stage 3b: Secondary | ICD-10-CM | POA: Diagnosis not present

## 2023-12-03 DIAGNOSIS — I609 Nontraumatic subarachnoid hemorrhage, unspecified: Secondary | ICD-10-CM | POA: Diagnosis not present

## 2023-12-03 DIAGNOSIS — I10 Essential (primary) hypertension: Secondary | ICD-10-CM | POA: Diagnosis not present

## 2023-12-03 DIAGNOSIS — I619 Nontraumatic intracerebral hemorrhage, unspecified: Secondary | ICD-10-CM | POA: Diagnosis not present

## 2023-12-03 DIAGNOSIS — E119 Type 2 diabetes mellitus without complications: Secondary | ICD-10-CM | POA: Diagnosis not present

## 2023-12-03 LAB — GLUCOSE, CAPILLARY
Glucose-Capillary: 144 mg/dL — ABNORMAL HIGH (ref 70–99)
Glucose-Capillary: 147 mg/dL — ABNORMAL HIGH (ref 70–99)
Glucose-Capillary: 149 mg/dL — ABNORMAL HIGH (ref 70–99)
Glucose-Capillary: 166 mg/dL — ABNORMAL HIGH (ref 70–99)
Glucose-Capillary: 204 mg/dL — ABNORMAL HIGH (ref 70–99)
Glucose-Capillary: 222 mg/dL — ABNORMAL HIGH (ref 70–99)
Glucose-Capillary: 231 mg/dL — ABNORMAL HIGH (ref 70–99)

## 2023-12-03 LAB — CBC
HCT: 26.1 % — ABNORMAL LOW (ref 39.0–52.0)
Hemoglobin: 8.6 g/dL — ABNORMAL LOW (ref 13.0–17.0)
MCH: 30.4 pg (ref 26.0–34.0)
MCHC: 33 g/dL (ref 30.0–36.0)
MCV: 92.2 fL (ref 80.0–100.0)
Platelets: 156 10*3/uL (ref 150–400)
RBC: 2.83 MIL/uL — ABNORMAL LOW (ref 4.22–5.81)
RDW: 14.4 % (ref 11.5–15.5)
WBC: 7.7 10*3/uL (ref 4.0–10.5)
nRBC: 0 % (ref 0.0–0.2)

## 2023-12-03 LAB — SODIUM
Sodium: 146 mmol/L — ABNORMAL HIGH (ref 135–145)
Sodium: 147 mmol/L — ABNORMAL HIGH (ref 135–145)
Sodium: 151 mmol/L — ABNORMAL HIGH (ref 135–145)
Sodium: 154 mmol/L — ABNORMAL HIGH (ref 135–145)

## 2023-12-03 LAB — BASIC METABOLIC PANEL
Anion gap: 13 (ref 5–15)
BUN: 16 mg/dL (ref 8–23)
CO2: 22 mmol/L (ref 22–32)
Calcium: 9.2 mg/dL (ref 8.9–10.3)
Chloride: 112 mmol/L — ABNORMAL HIGH (ref 98–111)
Creatinine, Ser: 1.49 mg/dL — ABNORMAL HIGH (ref 0.61–1.24)
GFR, Estimated: 49 mL/min — ABNORMAL LOW (ref >60)
Glucose, Bld: 173 mg/dL — ABNORMAL HIGH (ref 70–99)
Potassium: 3.7 mmol/L (ref 3.5–5.1)
Sodium: 147 mmol/L — ABNORMAL HIGH (ref 135–145)

## 2023-12-03 LAB — PHOSPHORUS: Phosphorus: 3 mg/dL (ref 2.5–4.6)

## 2023-12-03 LAB — MAGNESIUM: Magnesium: 1.8 mg/dL (ref 1.7–2.4)

## 2023-12-03 MED ORDER — SODIUM CHLORIDE 0.9 % IV SOLN: INTRAVENOUS | Status: DC

## 2023-12-03 MED ORDER — MAGNESIUM SULFATE 2 GM/50ML IV SOLN
2.0000 g | Freq: Once | INTRAVENOUS | Status: AC
  Administered 2023-12-03: 2 g via INTRAVENOUS
  Filled 2023-12-03: qty 50

## 2023-12-03 MED ORDER — SODIUM CHLORIDE 0.9 % IV SOLN
750.0000 mg | Freq: Two times a day (BID) | INTRAVENOUS | Status: DC
  Administered 2023-12-03 – 2023-12-07 (×8): 750 mg via INTRAVENOUS
  Filled 2023-12-03 (×10): qty 7.5

## 2023-12-03 MED ORDER — METOPROLOL TARTRATE 50 MG PO TABS
50.0000 mg | ORAL_TABLET | Freq: Two times a day (BID) | ORAL | Status: DC
  Administered 2023-12-03 – 2024-01-01 (×55): 50 mg
  Filled 2023-12-03 (×34): qty 1
  Filled 2023-12-03: qty 4
  Filled 2023-12-03 (×19): qty 1

## 2023-12-03 NOTE — Progress Notes (Signed)
NAME:  William Todd, MRN:  [id number], DOB:  03-06-1951, LOS: 3 ADMISSION DATE:  11/29/2023, CONSULTATION DATE:  11/29/2023 REFERRING MD:  Dr. Clayborne Dana - EDP, CHIEF COMPLAINT:  SAH with IPH   History of Present Illness:  William Todd is a 73 y.o. male with a past medical history significant for HTN, recurrent mechanical falls, protein calorie malnutrition, type 2 diabetes, chronic kidney disease, and prostate cancer 2014 who presented to the ED via EMS from [facility] after suffering ground-level fall at [facility].  Workup on admission revealed acute intraparenchymal hemorrhage of the left frontal lobe with multiple bilateral small volume SAH doubt shift, neurosurgery was consulted with no surgical interventions recommended.  Patient was admitted initially per [facility].   While boarding in the emergency department patient had repeat CT scan to follow-up on ICH which revealed progressive left frontal hematoma with intraventricular extension into the fourth ventricle resulting in anterior midline shift of 5 mm, no hydrocephalus.  Neurosurgery reviewed imaging and recommended admission to ICU under PCCM for close monitoring.   Pertinent  Medical History  HTN, recurrent mechanical falls, protein calorie malnutrition, type 2 diabetes, chronic kidney disease, and prostate cancer 2014   Significant Hospital Events: Including procedures, antibiotic start and stop dates in addition to other pertinent events   2/15 presented after ground-level fall from [facility] workup revealed ICH with bilateral [facility] 2/16 repeat CT while boarding in ED revealed progressive left frontal hematoma with intraventricular extension into the fourth ventricle resulting in anterior midline shift of 5 mm, no hydrocephalus.  Admission changed to ICU 2/18-CT angio, CT head with enlarging clot  Interim History / Subjective:   No overnight events Mental status remains the same   Objective   Blood pressure (!) 156/80, pulse 86, temperature  98.7 F (37.1 C), temperature source Axillary, resp. rate 16, weight 57.9 kg, SpO2 100%.        Intake/Output Summary (Last 24 hours) at 12/03/2023 0721 Last data filed at 12/03/2023 0600 Gross per 24 hour  Intake 728.63 ml  Output 1850 ml  Net -1121.37 ml   Filed Weights   12/01/23 1512 12/02/23 0702 12/03/23 0500  Weight: 61.9 kg 61.9 kg 57.9 kg    Examination: General: Does not appear to be in distress HEENT: Moist oral mucosa Neuro: Arousable, not vocalizing, not following commands CV: S1-S2 appreciated  PULM: Fair air entry, clear breath sounds GI: Bowel sounds appreciated Extremities: warm/dry, no edema  Skin: no rashes or lesions   CTA 12/02/2023 shows stable bilateral subarachnoid hemorrhage and intraventricular hemorrhage, intracranial mass effect with mild increased rightward midline shift about 7 to 8 mm from 5 mm-estimated blood volume 105 mL  Resolved Hospital Problem list     Assessment & Plan:   Left frontal intracerebral traumatic hematoma Bilateral subarachnoid hemorrhages secondary to trauma -Continue neurochecks -Neuroprotective measures- normothermia, euglycemia, HOB greater than 30, head in neutral alignment, normocapnia, normoxia -Replace cortrak today -Limit sedating medications  Neurology, neurosurgery aware of CT scan findings -Currently on hypertonic saline -Sodium 147 -Input by neurosurgery noted  History of mood disorder History of memory impairment -Continue delirium precautions  Acute kidney injury superimposed on chronic kidney disease stage IIIb -Avoid nephrotoxic medications -Trend renal parameters -Creatinine bump noted, will monitor  Hypertension Was on Norvasc, Toprol-XL -Currently on Lopressor IV  Type 2 diabetes -Continue SSI, CBG monitoring  History of recurrent mechanical falls with a history of recent left femoral neck fracture, s/p arthroplasty 11/16/2023, recent ankle fracture -Fall precautions  Does have a Posey  belt in place of present  Mental status does not appear significantly changed from the last 24 hours   Best Practice (right click and "Reselect all SmartList Selections" daily)   Diet/type: NPO; cortrak to be placed today DVT prophylaxis SCD Pressure ulcer(s): N/A GI prophylaxis: PPI Lines: N/A Foley:  N/A Code Status:  full code Last date of multidisciplinary goals of care discussion: pending   Labs   CBC: Recent Labs  Lab 11/29/23 2342 11/30/23 0400 12/01/23 0513 12/02/23 0624 12/03/23 0456  WBC 10.5 9.9 8.6 8.9 7.7  NEUTROABS 8.9*  --   --   --   --   HGB 9.6* 8.7* 8.0* 8.0* 8.6*  HCT 30.0* 26.7* 25.0* 23.9* 26.1*  MCV 94.6 94.0 93.3 90.9 92.2  PLT 212 203 154 164 156    Basic Metabolic Panel: Recent Labs  Lab 11/29/23 2342 11/30/23 0400 12/01/23 0513 12/01/23 1610 12/02/23 0624 12/02/23 1444 12/02/23 1925 12/03/23 0114 12/03/23 0456  NA 139 141 140  --  134* 140 147* 146* 147*  K 4.9 4.6 4.3  --  3.9  --   --   --  3.7  CL 103 105 104  --  97*  --   --   --  112*  CO2 25 25 26   --  24  --   --   --  22  GLUCOSE 262* 154* 141*  --  215*  --   --   --  173*  BUN 33* 29* 18  --  19  --   --   --  16  CREATININE 1.87* 1.57* 1.29*  --  1.17  --   --   --  1.49*  CALCIUM 9.3 9.0 9.2  --  8.6*  --   --   --  9.2  MG  --   --   --  1.7 1.7  --   --   --  1.8  PHOS  --   --   --  3.7 3.4  --   --   --  3.0   GFR: Estimated Creatinine Clearance: 36.2 mL/min (A) (by C-G formula based on SCr of 1.49 mg/dL (H)). Recent Labs  Lab 11/30/23 0400 12/01/23 0513 12/02/23 0624 12/03/23 0456  WBC 9.9 8.6 8.9 7.7    Liver Function Tests: Recent Labs  Lab 11/29/23 2342 11/30/23 0400  AST 43* 43*  ALT 51* 45*  ALKPHOS 89 90  BILITOT 0.6 0.6  PROT 6.6 6.1*  ALBUMIN 3.1* 2.9*   No results for input(s): "LIPASE", "AMYLASE" in the last 168 hours. Recent Labs  Lab 11/30/23 1226  AMMONIA 14    ABG    Component Value Date/Time   TCO2 25 01/01/2013 1312      Coagulation Profile: Recent Labs  Lab 11/30/23 0400  INR 1.2    Cardiac Enzymes: No results for input(s): "CKTOTAL", "CKMB", "CKMBINDEX", "TROPONINI" in the last 168 hours.  HbA1C: Hgb A1c MFr Bld  Date/Time Value Ref Range Status  12/02/2023 06:24 AM 6.1 (H) 4.8 - 5.6 % Final    Comment:    (NOTE) Pre diabetes:          5.7%-6.4%  Diabetes:              >6.4%  Glycemic control for   <7.0% adults with diabetes   11/16/2023 06:00 AM 6.4 (H) 4.8 - 5.6 % Final    Comment:    (NOTE) Pre diabetes:  5.7%-6.4%  Diabetes:              >6.4%  Glycemic control for   <7.0% adults with diabetes     CBG: Recent Labs  Lab 12/02/23 1134 12/02/23 1547 12/02/23 1951 12/03/23 0018 12/03/23 0406  GLUCAP 197* 199* 95 147* 144*    Review of Systems:   Unable to assess   Past Medical History:  He,  has a past medical history of Anxiety, Chronic kidney disease, Diabetes mellitus, History of radiation therapy (09/29/13- 11/25/13), Hypertension, and Prostate cancer (HCC) (06/01/2013).   Surgical History:   Past Surgical History:  Procedure Laterality Date   CARDIAC CATHETERIZATION  01/01/2013   FRACTURE SURGERY     left foot    HIP ARTHROPLASTY Left 11/16/2023   Procedure: ARTHROPLASTY BIPOLAR HIP (HEMIARTHROPLASTY);  Surgeon: Sheral Apley, MD;  Location: [facility] OR;  Service: Orthopedics;  Laterality: Left;   LEFT HEART CATHETERIZATION WITH CORONARY ANGIOGRAM N/A 01/01/2013   Procedure: LEFT HEART CATHETERIZATION WITH CORONARY ANGIOGRAM;  Surgeon: Robynn Pane, MD;  Location: [facility] CATH LAB;  Service: Cardiovascular;  Laterality: N/A;   PROSTATE BIOPSY  06/01/13     Social History:   reports that he has never smoked. He has never used smokeless tobacco. He reports that he does not drink alcohol and does not use drugs.   Family History:  His family history includes Prostate cancer (age of onset: 86) in his brother.   Allergies No Known Allergies   The patient  is critically ill with multiple organ systems failure and requires high complexity decision making for assessment and support, frequent evaluation and titration of therapies, application of advanced monitoring technologies and extensive interpretation of multiple databases. Critical Care Time devoted to patient care services described in this note independent of APP/resident time (if applicable)  is 31 minutes.   Virl Diamond MD [facility] Personal pager: See Amion If unanswered, please page CCM On-call: #[phone]

## 2023-12-03 NOTE — Procedures (Signed)
Cortrak  Person Inserting Tube:  William Todd, RD Tube Type:  Cortrak - 43 inches Tube Size:  10 Tube Location:  Left nare Secured by: Bridle Technique Used to Measure Tube Placement:  Marking at nare/corner of mouth Cortrak Secured At:  70 cm   Cortrak Tube Team Note:  Consult received to place a Cortrak feeding tube.   X-ray has been ordered by the Cortrak team. Please await xray read prior to use of tube.  If the tube becomes dislodged please keep the tube and contact the Cortrak team at [email or web address] for replacement.  If after hours and replacement cannot be delayed, place a NG tube and confirm placement with an abdominal x-ray.    Shelle Iron RD, LDN Contact via [employer].

## 2023-12-03 NOTE — Procedures (Signed)
Patient Name: William Todd  MRN: [id number]  Epilepsy Attending: Charlsie Quest  Referring Physician/Provider: Elmer Picker, NP  Date: 12/03/2023 Duration: 23.45 mins  Patient history: 73yo M with left frontal ICH. EEG to evaluate for seizure  Level of alertness: Awake, asleep  AEDs during EEG study: LEV  Technical aspects: This EEG study was done with scalp electrodes positioned according to the 10-20 International system of electrode placement. Electrical activity was reviewed with band pass filter of 1-70Hz , sensitivity of 7 uV/mm, display speed of 73mm/sec with a 60Hz  notched filter applied as appropriate. EEG data were recorded continuously and digitally stored.  Video monitoring was available and reviewed as appropriate.  Description: No clear posterior dominant rhythm was seen. Sleep was characterized by sleep spindles (12 to 14 Hz), maximal frontocentral region. EEG showed continuous generalized and maximal left frontal 3 to 6 Hz theta-delta slowing. Sharp waves were noted in left frontal region,at times periodic at 1hz . Hyperventilation and photic stimulation were not performed.     ABNORMALITY - Sharp wave, left frontal region - Continuous slow, generalized and maximal left frontal region  IMPRESSION: This study showed evidence of epileptogenicity and cortical dysfunction arising from left frontal region likely secondary to underlying ICH. Additionally there is moderate diffuse encephalopathy. No seizures were seen throughout the recording.  If suspicion for ictal activity remains a concern, a prolonged study can be considered.   Isaly Fasching Annabelle Harman

## 2023-12-03 NOTE — Progress Notes (Signed)
 EEG complete - results pending

## 2023-12-03 NOTE — Progress Notes (Signed)
SLP Cancellation Note  Patient Details Name: William Todd MRN: [id number] DOB: 05-26-1951   Cancelled treatment:       Reason Eval/Treat Not Completed: Patient not medically ready. Pt still not interactive enough today per RN .Will continue to follow.    Mahala Menghini., M.A. CCC-SLP Acute Rehabilitation Services Office [phone]  Secure chat preferred  12/03/2023, 2:40 PM

## 2023-12-03 NOTE — Progress Notes (Signed)
STROKE TEAM PROGRESS NOTE   SUBJECTIVE (INTERVAL HISTORY) His RN is at the bedside. Pt open eyes half on voice but resisted forced eye opening, not able to check pupils bilaterally. Moving right UE and LE less than left. Still nonverbal. EEG showed no seizure but sharp waves on the left.     OBJECTIVE Temp:  [97.7 F (36.5 C)-99.7 F (37.6 C)] 98.2 F (36.8 C) (02/19 2000) Pulse Rate:  [80-96] 83 (02/19 1800) Cardiac Rhythm: Normal sinus rhythm (02/19 2000) Resp:  [13-31] 23 (02/19 2100) BP: (128-164)/(64-98) 152/72 (02/19 2100) SpO2:  [98 %-100 %] 100 % (02/19 1800) Weight:  [57.9 kg] 57.9 kg (02/19 0500)  Recent Labs  Lab 12/03/23 0406 12/03/23 0812 12/03/23 1144 12/03/23 1551 12/03/23 1956  GLUCAP 144* 166* 149* 204* 231*   Recent Labs  Lab 11/29/23 2342 11/30/23 0400 12/01/23 0513 12/01/23 1610 12/02/23 0624 12/02/23 1444 12/03/23 0114 12/03/23 0456 12/03/23 0929 12/03/23 1258 12/03/23 2007  NA 139 141 140  --  134*   < > 146* 147* 147* 151* 154*  K 4.9 4.6 4.3  --  3.9  --   --  3.7  --   --   --   CL 103 105 104  --  97*  --   --  112*  --   --   --   CO2 25 25 26   --  24  --   --  22  --   --   --   GLUCOSE 262* 154* 141*  --  215*  --   --  173*  --   --   --   BUN 33* 29* 18  --  19  --   --  16  --   --   --   CREATININE 1.87* 1.57* 1.29*  --  1.17  --   --  1.49*  --   --   --   CALCIUM 9.3 9.0 9.2  --  8.6*  --   --  9.2  --   --   --   MG  --   --   --  1.7 1.7  --   --  1.8  --   --   --   PHOS  --   --   --  3.7 3.4  --   --  3.0  --   --   --    < > = values in this interval not displayed.   Recent Labs  Lab 11/29/23 2342 11/30/23 0400  AST 43* 43*  ALT 51* 45*  ALKPHOS 89 90  BILITOT 0.6 0.6  PROT 6.6 6.1*  ALBUMIN 3.1* 2.9*   Recent Labs  Lab 11/29/23 2342 11/30/23 0400 12/01/23 0513 12/02/23 0624 12/03/23 0456  WBC 10.5 9.9 8.6 8.9 7.7  NEUTROABS 8.9*  --   --   --   --   HGB 9.6* 8.7* 8.0* 8.0* 8.6*  HCT 30.0* 26.7* 25.0*  23.9* 26.1*  MCV 94.6 94.0 93.3 90.9 92.2  PLT 212 203 154 164 156   No results for input(s): "CKTOTAL", "CKMB", "CKMBINDEX", "TROPONINI" in the last 168 hours. No results for input(s): "LABPROT", "INR" in the last 72 hours.  Recent Labs    12/01/23 1731  COLORURINE RED*  LABSPEC TEST NOT REPORTED DUE TO COLOR INTERFERENCE OF URINE PIGMENT  PHURINE TEST NOT REPORTED DUE TO COLOR INTERFERENCE OF URINE PIGMENT  GLUCOSEU TEST NOT REPORTED DUE TO COLOR INTERFERENCE OF URINE PIGMENT*  HGBUR TEST NOT REPORTED DUE TO COLOR INTERFERENCE OF URINE PIGMENT*  BILIRUBINUR TEST NOT REPORTED DUE TO COLOR INTERFERENCE OF URINE PIGMENT*  KETONESUR TEST NOT REPORTED DUE TO COLOR INTERFERENCE OF URINE PIGMENT*  PROTEINUR TEST NOT REPORTED DUE TO COLOR INTERFERENCE OF URINE PIGMENT*  NITRITE TEST NOT REPORTED DUE TO COLOR INTERFERENCE OF URINE PIGMENT*  LEUKOCYTESUR TEST NOT REPORTED DUE TO COLOR INTERFERENCE OF URINE PIGMENT*       Component Value Date/Time   CHOL 111 12/02/2023 0624   TRIG 63 12/02/2023 0624   HDL 41 12/02/2023 0624   CHOLHDL 2.7 12/02/2023 0624   VLDL 13 12/02/2023 0624   LDLCALC 57 12/02/2023 0624   Lab Results  Component Value Date   HGBA1C 6.1 (H) 12/02/2023   No results found for: "LABOPIA", "COCAINSCRNUR", "LABBENZ", "AMPHETMU", "THCU", "LABBARB"  No results for input(s): "ETH" in the last 168 hours.  I have personally reviewed the radiological images below and agree with the radiology interpretations.  EEG adult Result Date: 12/03/2023 Charlsie Quest, MD     12/03/2023  4:09 PM Patient Name: DEVONDRE GUZZETTA MRN: [id number] Epilepsy Attending: Charlsie Quest Referring Physician/Provider: Elmer Picker, NP Date: 12/03/2023 Duration: 23.45 mins Patient history: 73yo M with left frontal ICH. EEG to evaluate for seizure Level of alertness: Awake, asleep AEDs during EEG study: LEV Technical aspects: This EEG study was done with scalp electrodes positioned according to the  10-20 International system of electrode placement. Electrical activity was reviewed with band pass filter of 1-70Hz , sensitivity of 7 uV/mm, display speed of 7mm/sec with a 60Hz  notched filter applied as appropriate. EEG data were recorded continuously and digitally stored.  Video monitoring was available and reviewed as appropriate. Description: No clear posterior dominant rhythm was seen. Sleep was characterized by sleep spindles (12 to 14 Hz), maximal frontocentral region. EEG showed continuous generalized and maximal left frontal 3 to 6 Hz theta-delta slowing. Sharp waves were noted in left frontal region,at times periodic at 1hz . Hyperventilation and photic stimulation were not performed.   ABNORMALITY - Sharp wave, left frontal region - Continuous slow, generalized and maximal left frontal region IMPRESSION: This study showed evidence of epileptogenicity and cortical dysfunction arising from left frontal region likely secondary to underlying ICH. Additionally there is moderate diffuse encephalopathy. No seizures were seen throughout the recording. If suspicion for ictal activity remains a concern, a prolonged study can be considered. Charlsie Quest   DG Abd Portable 1V Result Date: 12/03/2023 CLINICAL DATA:  73 year old male feeding tube placement. EXAM: PORTABLE ABDOMEN - 1 VIEW COMPARISON:  12/01/2023 and earlier. FINDINGS: Portable AP semi upright view at 1038 hours. Enteric feeding tube is in place within the proximal stomach, looped at the level of the gastric fundus. Negative lung bases. Nonobstructed bowel gas pattern. No acute osseous abnormality identified. IMPRESSION: Enteric feeding tube placed into the proximal stomach. Electronically Signed   By: Odessa Fleming M.D.   On: 12/03/2023 11:19   CT HEAD WO CONTRAST ( ) Result Date: 12/02/2023 CLINICAL DATA:  73 year old male with multifocal intracranial hemorrhage status post fall. Hemorrhage progression after presentation. EXAM: CT HEAD WITHOUT  CONTRAST TECHNIQUE: Contiguous axial images were obtained from the base of the skull through the vertex without intravenous contrast. RADIATION DOSE REDUCTION: This exam was performed according to the departmental dose-optimization program which includes automated exposure control, adjustment of the mA and/or kV according to patient size and/or use of iterative reconstruction technique. COMPARISON:  CTA head and neck this morning. Head  CT yesterday and earlier. FINDINGS: Brain: Large volume mixed density but mostly hyperdense intra-axial hemorrhage in the anterior left frontal lobe encompasses 78 by 44 x 63 mm (AP by transverse by CC) versus 79 x 45 x 61 mm yesterday when measured with the same technique. Volume estimated at 108 mL, versus 105 mL on 11/30/2023, and hemorrhage size and configuration appears visually stable. Superimposed scattered bilateral subarachnoid blood and a moderate volume of mostly now layering lateral intraventricular blood also appears stable. Rightward midline shift at the anterior septum pellucidum of 7-8 mm has increased from 5-6 mm on 11/30/2023. Ventricle size and configuration is stable. Stable basilar cistern patency. Superimposed chronic right PCA territory encephalomalacia and confluent bilateral cerebral white matter hypodensity. Vascular: Calcified atherosclerosis at the skull base. No suspicious intracranial vascular hyperdensity. Skull: Stable, intact. Sinuses/Orbits: Visualized paranasal sinuses and mastoids are stable and well aerated. Other: Stable, negative orbit and scalp soft tissues. IMPRESSION: 1. Large volume anterior frontal lobe intra-axial hemorrhage does not appear significantly changed in size or configuration since 11/30/2023. Using largest orthogonal dimensions, estimated blood volume is 108 mL now vs. 105 mL then. 2. Intracranial mass effect with mildly increased rightward midline shift since 11/30/2023, now 7-8 mm. Basilar cisterns remain patent. 3. Stable  bilateral SAH and IVH. Stable ventricle size and configuration. 4. Chronic right PCA territory infarct. Nonspecific white matter changes. Electronically Signed   By: Odessa Fleming M.D.   On: 12/02/2023 11:47   CT ANGIO HEAD NECK W WO CM Result Date: 12/02/2023 CLINICAL DATA:  73 year old male with multifocal intracranial hemorrhage status post fall. EXAM: CT ANGIOGRAPHY HEAD AND NECK TECHNIQUE: Multidetector CT imaging of the head and neck was performed using the standard protocol during bolus administration of intravenous contrast. Multiplanar CT image reconstructions and MIPs were obtained to evaluate the vascular anatomy. Carotid stenosis measurements (when applicable) are obtained utilizing NASCET criteria, using the distal internal carotid diameter as the denominator. RADIATION DOSE REDUCTION: This exam was performed according to the departmental dose-optimization program which includes automated exposure control, adjustment of the mA and/or kV according to patient size and/or use of iterative reconstruction technique. CONTRAST:  75mL OMNIPAQUE IOHEXOL 350 MG/ML SOLN COMPARISON:  Head CTs 12/01/2023 and earlier. Brain MRI 11/15/2023. FINDINGS: CTA NECK Skeleton: Cervical spine degeneration. 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 No acute osseous abnormality identified. Upper chest: Upper lungs are clear. Negative visible superior mediastinum. Other neck: Neck soft tissue spaces appear within normal limits. Aortic arch: Calcified aortic atherosclerosis.  3 vessel arch. Right carotid system: No significant brachiocephalic artery or or right CCA origin atherosclerosis. Soft and calcified plaque of the right CCA before the bifurcation without stenosis. Bulky calcified atherosclerosis of the proximal right ICA beginning at the origin but most pronounced at the bulb. Subsequent distal bulb stenosis is numerically estimated at 60 % with respect to the distal vessel (series  7, image 98 and series 9, image 155). Right ICA remains patent to the skull base. Left carotid system: Minimal left CCA atherosclerosis before the bifurcation. Soft and calcified plaque at the left ICA origin and more pronounced at the bulb. Up to 50 % stenosis with respect to the distal vessel. Additional calcified plaque of the left ICA just below the skull base without stenosis. Vertebral arteries: Mild proximal right subclavian atherosclerosis without stenosis. Calcified plaque at the right vertebral artery origin with only mild stenosis on series 8, image 145. Right vertebral is patent to the skull base with no significant plaque  or stenosis. Proximal left subclavian artery soft and calcified plaque without stenosis. Mostly soft plaque at the left vertebral artery origin with up to moderate origin stenosis on series 8, image 135. Left vertebral remains patent and is fairly codominant in the neck. Mild left V3 segment calcified plaque with no additional significant stenosis to the skull base. CTA HEAD Posterior circulation: Bilateral V4 segment atherosclerosis. Bulky and severe right V4 calcified plaque and stenosis on series 5, image 145. The right vertebrobasilar junction remains patent. Contralateral moderate left V4 segment stenosis proximal to the left PICA origin which remains normal. Right PICA origin is patent. Patent left vertebrobasilar junction. Patent basilar artery without stenosis. Fetal type bilateral PCA origins. Patent SCA origins. Bilateral PCA branches are within normal limits. Anterior circulation: Both ICA siphons are patent. Left siphon cavernous segment calcified plaque is moderate with mild to moderate stenosis at the anterior genu. Normal left posterior communicating artery origin. Right ICA cavernous through supraclinoid segment calcified plaque is moderate with mild to moderate supraclinoid stenosis. Normal right posterior communicating artery origin. Patent carotid termini. Patent MCA  origins. Dominant right and diminutive or absent left ACA A1 segments. Anterior communicating artery and bilateral ACA branches are within normal limits. Left MCA M1 segment and bifurcation are patent without stenosis. There is mild mass effect on the left MCA branches which otherwise appear within normal limits. Right MCA M1 segment and trifurcation are patent without stenosis. Right MCA branches are within normal limits. No CTA spot sign or abnormal vascularity associated with the left anterior superior frontal lobe intra-axial hemorrhage. Other findings: Grossly stable intracranial hemorrhage, mass effect from the head CT yesterday. Venous sinuses: Early contrast timing. Superior sagittal sinus, torcula, straight sinus, vein of Galen and internal cerebral veins, dominant appearing right transverse and sigmoid venous sinuses appear to be enhancing and patent. Anatomic variants: Fetal type bilateral PCA origins. Dominant right ACA A1. Review of the MIP images confirms the above findings IMPRESSION: 1. Negative for CTA spot sign in association with acute intracranial hemorrhage. Mild mass effect on left MCA vasculature. Negative for intracranial aneurysm, vascular malformation, or large vessel occlusion. 2. Atherosclerosis in the head and neck notable for: - 60% stenosis of the Right ICA bulb. - Moderate To Severe bilateral distal vertebral artery stenosis. - Moderate Left Vertebral Artery origin stenosis. - mild to moderate bilateral ICA siphon stenosis. 3.  Aortic Atherosclerosis (ICD10-I70.0). Electronically Signed   By: Odessa Fleming M.D.   On: 12/02/2023 06:41   DG Abd Portable 1V Result Date: 12/01/2023 CLINICAL DATA:  NG placement. EXAM: PORTABLE ABDOMEN - 1 VIEW COMPARISON:  CT abdomen pelvis dated 07/01/2013. FINDINGS: Feeding tube with tip in the left upper abdomen likely in the region of the gastric fundus. Large amount of stool throughout the colon. IMPRESSION: Feeding tube with tip in the region of the  gastric fundus. Electronically Signed   By: Elgie Collard M.D.   On: 12/01/2023 18:55   CT HEAD WO CONTRAST ( ) Result Date: 12/01/2023 CLINICAL DATA:  Follow-up of intracranial hemorrhage. EXAM: CT HEAD WITHOUT CONTRAST TECHNIQUE: Contiguous axial images were obtained from the base of the skull through the vertex without intravenous contrast. RADIATION DOSE REDUCTION: This exam was performed according to the departmental dose-optimization program which includes automated exposure control, adjustment of the mA and/or kV according to patient size and/or use of iterative reconstruction technique. COMPARISON:  None Available. FINDINGS: Brain: Redemonstrated intraparenchymal hematoma in the anterior left frontal lobe which measures 7.9 x 4.9 x 3.6  cm, previously measuring 7.8 x 5.2 x 3.4 cm when remeasured in a similar manner. Similar surrounding edema and associated mass effect. Approximately 5 mm rightward midline shift anteriorly similar to prior. Extension of hemorrhage into the adjacent left frontal horn with similar appearance of blood products in the left lateral ventricle. Increased blood products within the atrium and occipital horn of the right lateral ventricle. Slightly decreased blood products in the third ventricle. Small focus of subdural hemorrhage along the anterior falx. Scattered areas of subarachnoid hemorrhage along the right frontal lobe and right sylvian fissure again noted. Remote infarct in the right occipital lobe. Vascular: No hyperdense vessel or unexpected calcification. Skull: Normal. Negative for fracture or focal lesion. Sinuses/Orbits: No acute finding. Other: Mastoid air cells are clear. IMPRESSION: Similar appearance of intraparenchymal hematoma in the left frontal lobe with associated mass effect and 5 mm rightward midline shift. Additional small multi compartment intracranial hemorrhage as above. Slightly increased blood products within the atrium and occipital horn of the  right lateral ventricle. Electronically Signed   By: Emily Filbert M.D.   On: 12/01/2023 13:34   ECHOCARDIOGRAM COMPLETE Result Date: 11/30/2023    ECHOCARDIOGRAM REPORT   Patient Name:   TKAI SERFASS Date of Exam: 11/30/2023 Medical Rec #:  [id number]        Height:       72.0 in Accession #:    [id number]       Weight:       147.0 lb Date of Birth:  13-Aug-1951         BSA:          1.869 m Patient Age:    72 years         BP:           129/68 mmHg Patient Gender: M                HR:           77 bpm. Exam Location:  Inpatient Procedure: 2D Echo (Both Spectral and Color Flow Doppler were utilized during            procedure). Indications:    intracerebral hemorrhage  History:        Patient has prior history of Echocardiogram examinations, most                 recent 01/02/2013. Risk Factors:Hypertension, Diabetes and                 Dyslipidemia.  Sonographer:    Delcie Roch RDCS Referring Phys: [phone] WHITNEY D HARRIS IMPRESSIONS  1. Left ventricular ejection fraction, by estimation, is 60 to 65%. The left ventricle has normal function. The left ventricle has no regional wall motion abnormalities. Left ventricular diastolic parameters were normal.  2. Right ventricular systolic function is normal. The right ventricular size is normal. There is mildly elevated pulmonary artery systolic pressure.  3. The mitral valve is degenerative. Mild mitral valve regurgitation. No evidence of mitral stenosis.  4. The aortic valve is tricuspid. There is mild calcification of the aortic valve. Aortic valve regurgitation is not visualized. Aortic valve sclerosis is present, with no evidence of aortic valve stenosis.  5. The inferior vena cava is normal in size with greater than 50% respiratory variability, suggesting right atrial pressure of 3 mmHg. FINDINGS  Left Ventricle: Left ventricular ejection fraction, by estimation, is 60 to 65%. The left ventricle has normal function. The left ventricle has no regional wall  motion abnormalities. Strain imaging was not performed. The left ventricular internal cavity  size was normal in size. There is no left ventricular hypertrophy. Left ventricular diastolic parameters were normal. Right Ventricle: The right ventricular size is normal. No increase in right ventricular wall thickness. Right ventricular systolic function is normal. There is mildly elevated pulmonary artery systolic pressure. The tricuspid regurgitant velocity is 2.89  m/s, and with an assumed right atrial pressure of 3 mmHg, the estimated right ventricular systolic pressure is 36.4 mmHg. Left Atrium: Left atrial size was normal in size. Right Atrium: Right atrial size was normal in size. Pericardium: There is no evidence of pericardial effusion. Mitral Valve: The mitral valve is degenerative in appearance. There is mild calcification of the anterior mitral valve leaflet(s). Mild mitral valve regurgitation. No evidence of mitral valve stenosis. Tricuspid Valve: The tricuspid valve is grossly normal. Tricuspid valve regurgitation is mild . No evidence of tricuspid stenosis. Aortic Valve: The aortic valve is tricuspid. There is mild calcification of the aortic valve. Aortic valve regurgitation is not visualized. Aortic valve sclerosis is present, with no evidence of aortic valve stenosis. Pulmonic Valve: The pulmonic valve was grossly normal. Pulmonic valve regurgitation is not visualized. No evidence of pulmonic stenosis. Aorta: The aortic root and ascending aorta are structurally normal, with no evidence of dilitation. Venous: The inferior vena cava is normal in size with greater than 50% respiratory variability, suggesting right atrial pressure of 3 mmHg. IAS/Shunts: The atrial septum is grossly normal. Additional Comments: 3D imaging was not performed.  LEFT VENTRICLE PLAX 2D LVIDd:         3.20 cm   Diastology LVIDs:         1.70 cm   LV e' medial:    8.16 cm/s LV PW:         1.20 cm   LV E/e' medial:  10.7 LV IVS:         1.00 cm   LV e' lateral:   11.20 cm/s LVOT diam:     2.40 cm   LV E/e' lateral: 7.8 LV SV:         115 LV SV Index:   61 LVOT Area:     4.52 cm  RIGHT VENTRICLE             IVC RV Basal diam:  2.80 cm     IVC diam: 1.70 cm RV S prime:     12.90 cm/s TAPSE (M-mode): 2.2 cm LEFT ATRIUM             Index        RIGHT ATRIUM           Index LA diam:        3.30 cm 1.77 cm/m   RA Area:     13.00 cm LA Vol (A2C):   45.6 ml 24.39 ml/m  RA Volume:   29.50 ml  15.78 ml/m LA Vol (A4C):   39.1 ml 20.92 ml/m LA Biplane Vol: 43.8 ml 23.43 ml/m  AORTIC VALVE LVOT Vmax:   125.00 cm/s LVOT Vmean:  83.200 cm/s LVOT VTI:    0.254 m  AORTA Ao Root diam: 3.30 cm Ao Asc diam:  3.20 cm MITRAL VALVE               TRICUSPID VALVE MV Area (PHT): 3.37 cm    TR Peak grad:   33.4 mmHg MV Decel Time: 225 msec    TR Vmax:  289.00 cm/s MR Peak grad: 113.2 mmHg MR Mean grad: 73.0 mmHg    SHUNTS MR Vmax:      532.00 cm/s  Systemic VTI:  0.25 m MR Vmean:     403.0 cm/s   Systemic Diam: 2.40 cm MV E velocity: 87.30 cm/s MV A velocity: 70.40 cm/s MV E/A ratio:  1.24 Lennie Odor MD Electronically signed by Lennie Odor MD Signature Date/Time: 11/30/2023/8:57:24 PM    Final    CT Head Wo Contrast Result Date: 11/30/2023 CLINICAL DATA:  Follow-up head trauma EXAM: CT HEAD WITHOUT CONTRAST TECHNIQUE: Contiguous axial images were obtained from the base of the skull through the vertex without intravenous contrast. RADIATION DOSE REDUCTION: This exam was performed according to the departmental dose-optimization program which includes automated exposure control, adjustment of the mA and/or kV according to patient size and/or use of iterative reconstruction technique. COMPARISON:  Head CT from the same day at 2 a.m. FINDINGS: Brain: Growing hematoma in the anterior left frontal lobe, hemorrhagic area measuring 7.3 x 5 x 3.4 cm. The hemorrhage has now decompressed into the left lateral ventricle with tracking to the level of the fourth  ventricle. No hydrocephalus. Patchy subarachnoid hemorrhage along the frontal lobes and sylvian fissures which appears similar. The hemorrhage and edema causes progressive swelling with anterior midline shift measuring 5 mm. Chronic right occipital infarct. Low-density in the cerebral white matter from chronic small vessel ischemia. Vascular: No hyperdense vessel or unexpected calcification. Skull: No acute finding Sinuses/Orbits: No evidence of injury Critical Value/emergent results were called by telephone at the time of interpretation on 11/30/2023 at 8:45 am to provider [place], who verbally acknowledged these results. IMPRESSION: Progressive left frontal hematoma now measuring 60 cc with new intraventricular extension reaching the fourth ventricle. Patchy bilateral subarachnoid hemorrhage appears similar to prior. The growing hematoma and edema causes anterior midline shift by 5 mm. No hydrocephalus. Electronically Signed   By: Tiburcio Pea M.D.   On: 11/30/2023 08:46   CT Head Wo Contrast Result Date: 11/30/2023 CLINICAL DATA:  Head trauma, minor (Age >= 65y); Neck trauma (Age >= 65y) EXAM: CT HEAD WITHOUT CONTRAST CT CERVICAL SPINE WITHOUT CONTRAST TECHNIQUE: Multidetector CT imaging of the head and cervical spine was performed following the standard protocol without intravenous contrast. Multiplanar CT image reconstructions of the cervical spine were also generated. RADIATION DOSE REDUCTION: This exam was performed according to the departmental dose-optimization program which includes automated exposure control, adjustment of the mA and/or kV according to patient size and/or use of iterative reconstruction technique. COMPARISON:  MRI head November 15, 2023. FINDINGS: CT HEAD FINDINGS Brain: Acute intraparenchymal hemorrhage in the left frontal lobe measuring 2.6 cm with overlying extra-axial extension. Small volume of multifocal subarachnoid hemorrhage bilaterally along the right frontal convexity, left  frontal convexity and layering within the left sylvian fissure. No midline shift. No hydrocephalus. Remote right occipital infarct. Vascular: Calcific atherosclerosis.  No dense vessel. Skull: No acute fracture. Sinuses/Orbits: Clear sinuses.  No acute orbital findings. Other: No mastoid effusions. CT CERVICAL SPINE FINDINGS Alignment: No substantial sagittal subluxation. Skull base and vertebrae: No acute fracture. Soft tissues and spinal canal: No prevertebral fluid or swelling. No visible canal hematoma. Disc levels:  Moderate multilevel degenerative change. Upper chest: Visualized lung apices are clear. IMPRESSION: 1. Acute intraparenchymal hemorrhage in left frontal lobe with multifocal bilateral small volume subarachnoid hemorrhage. No midline shift. 2. No acute fracture or traumatic malalignment in the cervical spine. Findings discussed with Dr. Clayborne Dana via telephone at 2:19  a.m. Electronically Signed   By: Feliberto Harts M.D.   On: 11/30/2023 02:21   CT Cervical Spine Wo Contrast Result Date: 11/30/2023 CLINICAL DATA:  Head trauma, minor (Age >= 65y); Neck trauma (Age >= 65y) EXAM: CT HEAD WITHOUT CONTRAST CT CERVICAL SPINE WITHOUT CONTRAST TECHNIQUE: Multidetector CT imaging of the head and cervical spine was performed following the standard protocol without intravenous contrast. Multiplanar CT image reconstructions of the cervical spine were also generated. RADIATION DOSE REDUCTION: This exam was performed according to the departmental dose-optimization program which includes automated exposure control, adjustment of the mA and/or kV according to patient size and/or use of iterative reconstruction technique. COMPARISON:  MRI head November 15, 2023. FINDINGS: CT HEAD FINDINGS Brain: Acute intraparenchymal hemorrhage in the left frontal lobe measuring 2.6 cm with overlying extra-axial extension. Small volume of multifocal subarachnoid hemorrhage bilaterally along the right frontal convexity, left frontal  convexity and layering within the left sylvian fissure. No midline shift. No hydrocephalus. Remote right occipital infarct. Vascular: Calcific atherosclerosis.  No dense vessel. Skull: No acute fracture. Sinuses/Orbits: Clear sinuses.  No acute orbital findings. Other: No mastoid effusions. CT CERVICAL SPINE FINDINGS Alignment: No substantial sagittal subluxation. Skull base and vertebrae: No acute fracture. Soft tissues and spinal canal: No prevertebral fluid or swelling. No visible canal hematoma. Disc levels:  Moderate multilevel degenerative change. Upper chest: Visualized lung apices are clear. IMPRESSION: 1. Acute intraparenchymal hemorrhage in left frontal lobe with multifocal bilateral small volume subarachnoid hemorrhage. No midline shift. 2. No acute fracture or traumatic malalignment in the cervical spine. Findings discussed with Dr. Clayborne Dana via telephone at 2:19 a.m. Electronically Signed   By: Feliberto Harts M.D.   On: 11/30/2023 02:21   DG Chest Portable 1 View Result Date: 11/30/2023 CLINICAL DATA:  Fall EXAM: PORTABLE CHEST 1 VIEW COMPARISON:  03/05/2012 FINDINGS: There is asymmetric airspace infiltrate within the right mid lung zone, possibly infectious or posttraumatic given the history of recent trauma. No pneumothorax or pleural effusion. Cardiac size within normal limits. Pulmonary vascularity is normal. No acute bone abnormality. IMPRESSION: 1. Asymmetric airspace infiltrate within the right mid lung zone, possibly infectious or posttraumatic given the history of recent trauma. Electronically Signed   By: Helyn Numbers M.D.   On: 11/30/2023 01:18   DG Pelvis Portable Result Date: 11/30/2023 CLINICAL DATA:  Fall EXAM: PORTABLE PELVIS 1-2 VIEWS COMPARISON:  None Available. FINDINGS: Left total hip arthroplasty has been performed. No acute fracture or dislocation. Mild right hip degenerative arthritis. Vascular calcifications noted. Soft tissues are otherwise unremarkable. IMPRESSION: 1.  Left total hip arthroplasty. No acute fracture or dislocation. Electronically Signed   By: Helyn Numbers M.D.   On: 11/30/2023 01:17   DG HIP UNILAT W OR W/O PELVIS 2-3 VIEWS LEFT Result Date: 11/16/2023 CLINICAL DATA:  [id number] History of left hip hemiarthroplasty [id number] EXAM: DG HIP (WITH OR WITHOUT PELVIS) 2-3V LEFT COMPARISON:  11/15/2023 FINDINGS: Interval left hip arthroplasty, components projecting in expected location. No fracture or dislocation. Surgical clips project over the pubic bones. Patchy iliofemoral arterial calcifications. IMPRESSION: Left hip arthroplasty, without apparent complication. Electronically Signed   By: Corlis Leak M.D.   On: 11/16/2023 16:11   MR BRAIN WO CONTRAST Result Date: 11/15/2023 CLINICAL DATA:  Abnl CT Head, evaluate for pontine infarct EXAM: MRI HEAD WITHOUT CONTRAST TECHNIQUE: Multiplanar, multiecho pulse sequences of the brain and surrounding structures were obtained without intravenous contrast. COMPARISON:  CT head from today. FINDINGS: Brain: No acute infarction, hemorrhage, hydrocephalus, extra-axial  collection or mass lesion. Remote right PCA territory infarct. Moderate T2/FLAIR hyperintensities in the white matter, compatible with chronic microvascular ischemic disease. Vascular: Major arterial flow voids are maintained at the skull base. Skull and upper cervical spine: Normal marrow signal. Sinuses/Orbits: Negative. Other: No sizable mastoid effusions. IMPRESSION: 1. No evidence of acute intracranial abnormality. 2. Remote right PCA territory infarct. Electronically Signed   By: Feliberto Harts M.D.   On: 11/15/2023 22:38   CT Head Wo Contrast Result Date: 11/15/2023 CLINICAL DATA:  Altered mental status, fall EXAM: CT HEAD WITHOUT CONTRAST TECHNIQUE: Contiguous axial images were obtained from the base of the skull through the vertex without intravenous contrast. RADIATION DOSE REDUCTION: This exam was performed according to the departmental  dose-optimization program which includes automated exposure control, adjustment of the mA and/or kV according to patient size and/or use of iterative reconstruction technique. COMPARISON:  None Available. FINDINGS: Brain: No acute territorial infarction, hemorrhage or intracranial mass. 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 Atrophy and advanced chronic small vessel ischemic changes of the white matter. Hypodensity at the pons, series 2, image 11 and sagittal series 6 image 26. Ventricles are nonenlarged Vascular: No hyperdense vessels. Carotid and vertebral calcifications. Skull: Normal. Negative for fracture or focal lesion. Sinuses/Orbits: No acute finding. Other: None IMPRESSION: 1. Negative for acute intracranial hemorrhage or mass. 2. Hypodensity at the pons, though some of this may be related to artifact, the finding is rounded with some sparing of peripheral tissue and acute or subacute pontine infarct cannot be excluded. MRI is recommended for further evaluation. 3. Atrophy and chronic small vessel ischemic changes of the white matter Electronically Signed   By: Jasmine Pang M.D.   On: 11/15/2023 18:51   DG Ankle Complete Left Result Date: 11/15/2023 CLINICAL DATA:  Fall with ankle pain EXAM: LEFT ANKLE COMPLETE - 3+ VIEW COMPARISON:  01/30/2012 FINDINGS: Lateral plate and multiple fixating screws with additional medial malleolar fixating screws. Chronic postsurgical, posttraumatic and post infectious changes involving the distal tibia, distal fibula and tibiotalar joint. Fused appearance across the tibiotalar joint and probable subtalar fusion as well. No definite acute fracture is seen. IMPRESSION: Chronic postsurgical, posttraumatic and post infectious changes involving the distal tibia, distal fibula and tibiotalar joint. No definite acute osseous abnormality. Electronically Signed   By: Jasmine Pang M.D.   On: 11/15/2023 18:09   DG Foot Complete Left Result  Date: 11/15/2023 CLINICAL DATA:  Larey Seat 2 weeks ago with left foot pain. EXAM: LEFT FOOT - COMPLETE 3+ VIEW COMPARISON:  Left ankle 01/30/2012 FINDINGS: Diffuse bone demineralization. Degenerative changes in the interphalangeal joints, metatarsal-phalangeal, tarsometatarsal, and interphalangeal joints. Postoperative fusion of the ankle joint with plate and screw fixation. No evidence of acute fracture or dislocation. Soft tissues are unremarkable. IMPRESSION: 1. No acute bony abnormalities. 2. Degenerative changes in the left foot. 3. Postoperative changes with surgical fusion of the left ankle including plate and screw fixation Electronically Signed   By: Burman Nieves M.D.   On: 11/15/2023 18:03   DG Hip Unilat W or Wo Pelvis 2-3 Views Left Result Date: 11/15/2023 CLINICAL DATA:  Fall 2 weeks ago. Dizziness with fall forward. Left foot pain. EXAM: DG HIP (WITH OR WITHOUT PELVIS) 2-3V LEFT COMPARISON:  None Available. FINDINGS: Transverse subcapital fracture of the left femoral neck with varus angulation of the fracture fragments. No inter trochanteric involvement is identified. No dislocation at the hip joint. Pelvis appears intact. SI joints and symphysis pubis are not  displaced. No destructive or expansile bone lesions. Degenerative changes in the lower lumbar spine and both hips. Vascular calcifications. IMPRESSION: Acute transverse fracture of the left femoral neck with varus angulation of the fracture fragments. Degenerative changes. Electronically Signed   By: Burman Nieves M.D.   On: 11/15/2023 18:02     PHYSICAL EXAM  Temp:  [97.7 F (36.5 C)-99.7 F (37.6 C)] 98.2 F (36.8 C) (02/19 2000) Pulse Rate:  [80-96] 83 (02/19 1800) Resp:  [13-31] 23 (02/19 2100) BP: (128-164)/(64-98) 152/72 (02/19 2100) SpO2:  [98 %-100 %] 100 % (02/19 1800) Weight:  [57.9 kg] 57.9 kg (02/19 0500)  General - well nourished, well developed, in no apparent distress.     Ophthalmologic - fundi not visualized  due to noncooperation.     Cardiovascular - regular rhythm and rate   Neuro - eyes halfway open on voice, resisted forced opening, nonverbal and not following commands. Not able to check eye gaze or pupil today due to resisting forced eye opening. Mild right facial droop. Tongue protrusion or mouth open not cooperative. Withdraw to pain on all extremities but more so on the left. Sensation, coordination and gait not tested.   ASSESSMENT/PLAN Mr. JAHDEN SCHARA is a 73 y.o. male with PMH of DM, HTN, HLD, prostate cancer, left foot fracture with prosthesis, dementia and recent fall with acute transverse fracture of the left femoral neck s/p hemiarthroplasty on 2/2. Post op delirium/confusion but MRI at that time no acute finding, no stroke or hemorrhage. No sign of CAA. He was put on ASA 81 bid for 4 weeks for DVT prophylaxis. He was discharged to SNF on 11/20/23. Readmitted on 2/15 with fall at SNF and left eyebrow laceration. CT head showed left frontal small ICH with bilateral small SAH, concerning for traumatic ICH with contusion. However repeat CT 2/16 showed significant enlarged hematoma on the left frontal area with multi-lobe configuration and IVH and SAH but no hydro with 5mm MLS.     ICH:  left frontal large ICH, etiology unclear, traumatic ICH vs. Spontaneous ICH. Less likely for hemorrhagic infarct CT head 2/16 left frontal small ICH with bilateral small SAH, concerning for traumatic ICH with contusion.  Repeat CT 2/16 showed significant enlarged hematoma on the left frontal area with multi-lobe configuration and IVH and SAH but no hydro with 5mm MLS.    Repeat CT 2/17 Similar appearance of intraparenchymal hematoma in the left frontal lobe with associated mass effect and 5 mm rightward midline shift. CTA head and neck - no spot sign. 60% R ICA bulb, b/l distal VA mod to severe stenosis,  mod left VA origin stenosis and mild to mod b/l ICA siphon CT repeat 2/18 large left frontal ICH 108cc  vs. 2/17 105cc but with MLS of 7-8 mm.  MRI not able to tolerate so far CT repeat in am 2D Echo  EF 60-65% EEG left frontal sharp waves,  moderate diffuse encephalopathy, no seizure LDL 57 HgbA1c 6.1 SCDs for VTE prophylaxis aspirin 81 mg daily bid prior to admission, now on No antithrombotic.  Keppra 500 -> 750 mg bid Ongoing aggressive stroke risk factor management Therapy recommendations:  CIR Disposition:  pending  Cerebral edema Repeat CT 2/17 5 mm rightward midline shift. CT repeat 2/18 MLS of 7-8 mm.  CT repeat pending in am Na 140-134-147-151-154 Put on 3% saline @ 50-> NS @ 50 Bolus with 23.4% x 1 Na monitoring with goal 150-155  Diabetes HgbA1c 6.1 goal < 7.0 Controlled  CBG monitoring SSI DM education and close PCP follow up  Hypertension Stable on the high end On norvasc 10 and metoprolol 50 bid Off IV metoprolol BP goal < 160 Long term BP goal normotensive  Hyperlipidemia Home meds:  lipitor 40  LDL 57, goal < 70 Consider to resume lipitor on discharge  Other Stroke Risk Factors Advanced age  Other Active Problems Wife denies hx of dementia and stated that pt was functional before the fall earlier this month Recent fracture - fall with acute transverse fracture of the left femoral neck with varus angulation of the fracture fragments. Underwent hemiarthroplasty on 2/2.   Hospital day # 3  This patient is critically ill due to large frontal ICH, cerebral edema and at significant risk of neurological worsening, death form brain herniation, seizure. This patient's care requires constant monitoring of vital signs, hemodynamics, respiratory and cardiac monitoring, review of multiple databases, neurological assessment, discussion with family, other specialists and medical decision making of high complexity. I spent 40 minutes of neurocritical care time in the care of this patient.   Marvel Plan, MD PhD Stroke Neurology 12/03/2023 10:00 PM    To contact  Stroke Continuity provider, please refer to [email or web address]. After hours, contact General Neurology

## 2023-12-03 NOTE — Progress Notes (Signed)
Patient ID: William Todd, male   DOB: 1951-08-27, 73 y.o.   MRN: [id number] Vital signs are stable and the patient arouses readily to voice.  He is moving his extremities.  He is verbal with 1 or 2 words here and there.  At the current time his level of consciousness does not require than surgical intervention I believe that we can continue to observe him clinically.  I did Put a call into Mrs. Erber however it went to voicemail and I left a brief message that I do not feel surgery is necessary at this time.  Will see if we can reach out to her later

## 2023-12-03 NOTE — Progress Notes (Signed)
Nutrition Follow-up  DOCUMENTATION CODES:  Severe malnutrition in context of chronic illness  INTERVENTION:  Initiate tube feeding via Cortrak: Osmolite 1.5 at 50 ml/h (1200 ml per day) Initiate at 61mL/hr and advance at 13mL/hr Q10 hours until goal rate achieved Prosource TF20 60 ml daily   Provides 1880 kcal, 95 gm protein, free water daily    Monitor magnesium, potassium, and phosphorus daily for at least 3 days, MD to replete as needed Add Thiamine 100 mg daily for 5 days  MVI w/ minerals  NUTRITION DIAGNOSIS:  Severe Malnutrition related to chronic illness as evidenced by severe fat depletion, severe muscle depletion, percent weight loss. - remains applicable  GOAL:  Patient will meet greater than or equal to 90% of their needs - will meet with TF regimen at goal rate  MONITOR:  Diet advancement, TF tolerance, Weight trends, Labs, Skin  REASON FOR ASSESSMENT:  Consult Enteral/tube feeding initiation and management, Assessment of nutrition requirement/status  ASSESSMENT:   Pt recently discharged on 2/6 after mechanical fall at home resulting in L femoral neck fracture requiring L hip arthroplasty. Discharged to [facility] where he fell again resulting in ICH. PMH: CAD, HLD, T2DM, HTN, anxiety, and prostate cancer (radiation 09/29/13-11/25/13). No formal dx of dementia, however with poor short term memory.  2/6 discharged from [facility] to [facility] 2/16 admitted to [facility] w/ ICH with bilateral [facility] 2/17 Cortrak placed/TF initiated/ pt pulled out Cortrak 2/19 Cortrak replaced/TF re-initiated/EEG  Mentation remains the same over the last 24 hours. MLS worsened from 2/17-2/18 from 5mm to 7-35mm, respectively. Cortrak replaced today. TF re-initiated. Still monitoring lab trends to assess for refeeding.   Admit Weight: 61.9kg Current Weight: 57.9kg  Per neurosurgery (2/18), Na monitoring w/ goal 150-155. MVI continues. Patient did receive 2mg  Mg Sulfate today.   Meds: SSI,  MVI, pantoprazole, senna, thiamine Drips: 2g Mg sulfate x1 NaCl @ 80mL/hr  Labs: Na+ 151  K+ 3.7 (wdl) CBGs 173-215 over 48 hours A1c 6.1 (11/2023)   Diet Order:   Diet Order             Diet NPO time specified  Diet effective now            EDUCATION NEEDS:  Not appropriate for education at this time  Skin:  Skin Assessment: Skin Integrity Issues: Skin Integrity Issues:: Stage III, Stage II Stage II: L hip arthroplasty site Stage III: L posterior knee  Last BM:  PTA - pt unable to endorse  Height:  Ht Readings from Last 1 Encounters:  11/16/23 6' 0.01" (1.829 m)   Weight:  Wt Readings from Last 1 Encounters:  12/03/23 57.9 kg   Ideal Body Weight:  83.6 kg  BMI:  Body mass index is 17.31 kg/m.  Estimated Nutritional Needs:   Kcal:  1800-2000kcal  Protein:  90-100g  Fluid:  >1.8L/day  Myrtie Cruise MS, RD, LDN Registered Dietitian Clinical Nutrition RD Inpatient Contact Info in Amion

## 2023-12-03 NOTE — Progress Notes (Signed)
Occupational Therapy Treatment Patient Details Name: William Todd MRN: [id number] DOB: 1951-05-17 Today's Date: 12/03/2023   History of present illness Pt is 73 yo male who returns on 11/29/23 from [facility] after falling on L side where he just had hip sx. Xray of pelvis shows stable hip, CT of head showed acute IPH of L frontal lobe and bilateral small SAH. Cognitive status declined and pt found to have L frontal hematoma with extension into 4th ventricle.  PMH: HTN, recurrent mechanical falls, protein calorie malnutrition, DM2, CKD, and prostate cancer 2014, L foot fx with AFO,  L THA 11/16/23, anxiety, dementia   OT comments  Pt progressing toward OT goals slowly this session secondary to agitation. Of note seen at 3pm so may have been starting to sun down per RN. Pt needing max A to come to EOB but once EOB, able to maintain static sit for ~10 mins. Max A for oral care and washing face. More receptive to washing face but pushing OT hand away during oral care. Only one verbalization this session bringing hand to mouth and stating "drink" but pt NPO. Attempted to assist pt with transfer, but pt with posterior lean with OT/RN attempts to weight shift forward and with increasing agitation, so deferred. Will continue to follow.       If plan is discharge home, recommend the following:  Two people to help with walking and/or transfers;Two people to help with bathing/dressing/bathroom   Equipment Recommendations  BSC/3in1;Wheelchair (measurements OT);Wheelchair cushion (measurements OT)    Recommendations for Other Services      Precautions / Restrictions Precautions Precautions: Fall;Posterior Hip (from 11/16/23) Precaution Booklet Issued: No Recall of Precautions/Restrictions: Impaired Required Braces or Orthoses: Other Brace Other Brace: L AFO Restrictions Weight Bearing Restrictions Per Provider Order: No LLE Weight Bearing Per Provider Order: Weight bearing as tolerated        Mobility Bed Mobility Overal bed mobility: Needs Assistance Bed Mobility: Supine to Sit, Sit to Supine     Supine to sit: Max assist, +2 for physical assistance Sit to supine: Mod assist   General bed mobility comments: max A +2 with posterior lean initially progressing to CGA at EOB    Transfers                   General transfer comment: could not attempt today due to pt agitation and pushing OT/RN away with attempts to assist despite attempts at 2 person or one person FTF transfer     Balance Overall balance assessment: Needs assistance Sitting-balance support: Bilateral upper extremity supported, Feet supported Sitting balance-Leahy Scale: Fair Sitting balance - Comments: given increased time to orient to upright                                   ADL either performed or assessed with clinical judgement   ADL Overall ADL's : Needs assistance/impaired     Grooming: Maximal assistance;Sitting Grooming Details (indicate cue type and reason): to wash face. Pt with limited participation and pushing OT hand away intermittently                                    Extremity/Trunk Assessment Upper Extremity Assessment Upper Extremity Assessment:  (using BUE this session to push grooming items away. Good strength)   Lower Extremity Assessment Lower Extremity Assessment:  Defer to PT evaluation        Vision   Vision Assessment?: Vision impaired- to be further tested in functional context Additional Comments: not following commands to formally test but no closing of one eye noted as in prior session. no eye patch seen in room. pt does have dysconjugate gaze   Perception     Praxis     Communication Communication Communication: Impaired   Cognition Arousal: Lethargic, Alert Behavior During Therapy: Agitated, Flat affect Cognition: History of cognitive impairments, Cognition impaired     Awareness: Online awareness impaired,  Intellectual awareness impaired   Attention impairment (select first level of impairment): Focused attention Executive functioning impairment (select all impairments): Initiation OT - Cognition Comments: pt with no verbalizations, not following commands this session. In light sleep state on arrival unsure if this is affecting pt also noting time of day seen may affect pt as pt with hx of dementia and RN reports sun downs                 Following commands: Impaired Following commands impaired: Follows one step commands inconsistently      Cueing   Cueing Techniques: Verbal cues, Gestural cues, Tactile cues, Visual cues  Exercises      Shoulder Instructions       General Comments actively moving BUE at EOB and attempts to pull out IV at R wrist    Pertinent Vitals/ Pain       Pain Assessment Pain Assessment: Faces Faces Pain Scale: Hurts little more Pain Location: generalized Pain Descriptors / Indicators: Discomfort Pain Intervention(s): Limited activity within patient's tolerance, Monitored during session  Home Living                                          Prior Functioning/Environment              Frequency  Min 1X/week        Progress Toward Goals  OT Goals(current goals can now be found in the care plan section)  Progress towards OT goals: Progressing toward goals  Acute Rehab OT Goals Patient Stated Goal: per prior notes, to be home with family OT Goal Formulation: With patient/family Time For Goal Achievement: 12/15/23 Potential to Achieve Goals: Good ADL Goals Pt Will Perform Eating: with min assist;sitting Pt Will Perform Grooming: with min assist;sitting Pt Will Perform Lower Body Dressing: with mod assist;sit to/from stand Pt Will Transfer to Toilet: with +2 assist;with min assist;bedside commode;stand pivot transfer Additional ADL Goal #1: pt will follow 2 step commands 50% of session attempts Additional ADL Goal #2:  pt will complete bed mobility mod (A) as precursor to adls  Plan      Co-evaluation                 AM-PAC OT "6 Clicks" Daily Activity     Outcome Measure   Help from another person eating meals?: A Lot Help from another person taking care of personal grooming?: A Lot Help from another person toileting, which includes using toliet, bedpan, or urinal?: Total Help from another person bathing (including washing, rinsing, drying)?: A Lot Help from another person to put on and taking off regular upper body clothing?: A Lot Help from another person to put on and taking off regular lower body clothing?: Total 6 Click Score: 10    End of Session  OT Visit Diagnosis: Unsteadiness on feet (R26.81)   Activity Tolerance Patient tolerated treatment well   Patient Left with call bell/phone within reach;in bed;with bed alarm set   Nurse Communication Mobility status;Precautions;Need for lift equipment        Time: 1191-4782 OT Time Calculation (min): 23 min  Charges: OT General Charges $OT Visit: 1 Visit OT Treatments $Self Care/Home Management : 23-37 mins  Tyler Deis, OTR/L [facility] Acute Rehabilitation Office: [phone]   Myrla Halsted 12/03/2023, 6:00 PM

## 2023-12-04 ENCOUNTER — Inpatient Hospital Stay (HOSPITAL_COMMUNITY): Payer: Medicare HMO

## 2023-12-04 DIAGNOSIS — N1832 Chronic kidney disease, stage 3b: Secondary | ICD-10-CM | POA: Diagnosis not present

## 2023-12-04 DIAGNOSIS — I609 Nontraumatic subarachnoid hemorrhage, unspecified: Secondary | ICD-10-CM | POA: Diagnosis not present

## 2023-12-04 DIAGNOSIS — I10 Essential (primary) hypertension: Secondary | ICD-10-CM | POA: Diagnosis not present

## 2023-12-04 DIAGNOSIS — I619 Nontraumatic intracerebral hemorrhage, unspecified: Secondary | ICD-10-CM | POA: Diagnosis not present

## 2023-12-04 DIAGNOSIS — E119 Type 2 diabetes mellitus without complications: Secondary | ICD-10-CM | POA: Diagnosis not present

## 2023-12-04 LAB — GLUCOSE, CAPILLARY
Glucose-Capillary: 322 mg/dL — ABNORMAL HIGH (ref 70–99)
Glucose-Capillary: 351 mg/dL — ABNORMAL HIGH (ref 70–99)
Glucose-Capillary: 119 mg/dL — ABNORMAL HIGH (ref 70–99)
Glucose-Capillary: 96 mg/dL (ref 70–99)
Glucose-Capillary: 156 mg/dL — ABNORMAL HIGH (ref 70–99)

## 2023-12-04 LAB — BASIC METABOLIC PANEL
Anion gap: 7 (ref 5–15)
BUN: 18 mg/dL (ref 8–23)
CO2: 27 mmol/L (ref 22–32)
Calcium: 8.7 mg/dL — ABNORMAL LOW (ref 8.9–10.3)
Chloride: 117 mmol/L — ABNORMAL HIGH (ref 98–111)
Creatinine, Ser: 1.32 mg/dL — ABNORMAL HIGH (ref 0.61–1.24)
GFR, Estimated: 57 mL/min — ABNORMAL LOW (ref >60)
Glucose, Bld: 362 mg/dL — ABNORMAL HIGH (ref 70–99)
Potassium: 3.6 mmol/L (ref 3.5–5.1)
Sodium: 151 mmol/L — ABNORMAL HIGH (ref 135–145)

## 2023-12-04 LAB — CBC
HCT: 24.2 % — ABNORMAL LOW (ref 39.0–52.0)
Hemoglobin: 7.8 g/dL — ABNORMAL LOW (ref 13.0–17.0)
MCH: 30.1 pg (ref 26.0–34.0)
MCHC: 32.2 g/dL (ref 30.0–36.0)
MCV: 93.4 fL (ref 80.0–100.0)
Platelets: 145 10*3/uL — ABNORMAL LOW (ref 150–400)
RBC: 2.59 MIL/uL — ABNORMAL LOW (ref 4.22–5.81)
RDW: 14.6 % (ref 11.5–15.5)
WBC: 6.8 10*3/uL (ref 4.0–10.5)
nRBC: 0 % (ref 0.0–0.2)

## 2023-12-04 LAB — SODIUM
Sodium: 156 mmol/L — ABNORMAL HIGH (ref 135–145)
Sodium: 152 mmol/L — ABNORMAL HIGH (ref 135–145)
Sodium: 160 mmol/L — ABNORMAL HIGH (ref 135–145)
Sodium: 156 mmol/L — ABNORMAL HIGH (ref 135–145)

## 2023-12-04 MED ORDER — HYDRALAZINE HCL 25 MG PO TABS
25.0000 mg | ORAL_TABLET | Freq: Three times a day (TID) | ORAL | Status: DC
  Administered 2023-12-04 – 2023-12-07 (×11): 25 mg
  Filled 2023-12-04 (×11): qty 1

## 2023-12-04 MED ORDER — ORAL CARE MOUTH RINSE: 15.0000 mL | OROMUCOSAL | Status: DC | PRN

## 2023-12-04 MED ORDER — ORAL CARE MOUTH RINSE
15.0000 mL | OROMUCOSAL | Status: DC
  Administered 2023-12-04 – 2024-01-01 (×108): 15 mL via OROMUCOSAL

## 2023-12-04 MED ORDER — INSULIN ASPART 100 UNIT/ML IJ SOLN
0.0000 [IU] | INTRAMUSCULAR | Status: DC
  Administered 2023-12-04: 15 [IU] via SUBCUTANEOUS
  Administered 2023-12-04: 3 [IU] via SUBCUTANEOUS
  Administered 2023-12-05: 8 [IU] via SUBCUTANEOUS
  Administered 2023-12-05 (×2): 5 [IU] via SUBCUTANEOUS
  Administered 2023-12-06 (×2): 2 [IU] via SUBCUTANEOUS
  Administered 2023-12-06: 8 [IU] via SUBCUTANEOUS
  Administered 2023-12-06: 2 [IU] via SUBCUTANEOUS
  Administered 2023-12-06: 5 [IU] via SUBCUTANEOUS
  Administered 2023-12-07: 3 [IU] via SUBCUTANEOUS
  Administered 2023-12-07: 5 [IU] via SUBCUTANEOUS
  Administered 2023-12-07: 2 [IU] via SUBCUTANEOUS
  Administered 2023-12-07: 5 [IU] via SUBCUTANEOUS
  Administered 2023-12-07: 2 [IU] via SUBCUTANEOUS
  Administered 2023-12-07 – 2023-12-08 (×4): 3 [IU] via SUBCUTANEOUS
  Administered 2023-12-08: 2 [IU] via SUBCUTANEOUS
  Administered 2023-12-08 (×2): 3 [IU] via SUBCUTANEOUS
  Administered 2023-12-08: 5 [IU] via SUBCUTANEOUS
  Administered 2023-12-09 (×2): 3 [IU] via SUBCUTANEOUS
  Administered 2023-12-09: 2 [IU] via SUBCUTANEOUS
  Administered 2023-12-09 – 2023-12-10 (×6): 3 [IU] via SUBCUTANEOUS
  Administered 2023-12-10: 2 [IU] via SUBCUTANEOUS
  Administered 2023-12-10: 3 [IU] via SUBCUTANEOUS
  Administered 2023-12-11: 2 [IU] via SUBCUTANEOUS
  Administered 2023-12-11 – 2023-12-12 (×8): 3 [IU] via SUBCUTANEOUS
  Administered 2023-12-12: 8 [IU] via SUBCUTANEOUS
  Administered 2023-12-12 – 2023-12-13 (×3): 3 [IU] via SUBCUTANEOUS
  Administered 2023-12-13: 8 [IU] via SUBCUTANEOUS
  Administered 2023-12-13: 5 [IU] via SUBCUTANEOUS
  Administered 2023-12-13: 2 [IU] via SUBCUTANEOUS
  Administered 2023-12-13: 5 [IU] via SUBCUTANEOUS
  Administered 2023-12-13: 3 [IU] via SUBCUTANEOUS
  Administered 2023-12-14: 8 [IU] via SUBCUTANEOUS
  Administered 2023-12-14 (×3): 5 [IU] via SUBCUTANEOUS
  Administered 2023-12-14: 3 [IU] via SUBCUTANEOUS
  Administered 2023-12-15: 8 [IU] via SUBCUTANEOUS
  Administered 2023-12-15: 3 [IU] via SUBCUTANEOUS
  Administered 2023-12-15: 8 [IU] via SUBCUTANEOUS
  Administered 2023-12-15: 3 [IU] via SUBCUTANEOUS
  Administered 2023-12-15: 5 [IU] via SUBCUTANEOUS
  Administered 2023-12-16: 3 [IU] via SUBCUTANEOUS
  Administered 2023-12-16: 8 [IU] via SUBCUTANEOUS
  Administered 2023-12-16 (×3): 5 [IU] via SUBCUTANEOUS
  Administered 2023-12-16: 3 [IU] via SUBCUTANEOUS
  Administered 2023-12-16 – 2023-12-17 (×2): 5 [IU] via SUBCUTANEOUS
  Administered 2023-12-17: 3 [IU] via SUBCUTANEOUS
  Administered 2023-12-17: 8 [IU] via SUBCUTANEOUS
  Administered 2023-12-17: 3 [IU] via SUBCUTANEOUS
  Administered 2023-12-17 – 2023-12-18 (×2): 5 [IU] via SUBCUTANEOUS
  Administered 2023-12-18: 2 [IU] via SUBCUTANEOUS
  Administered 2023-12-18 (×2): 5 [IU] via SUBCUTANEOUS
  Administered 2023-12-18: 8 [IU] via SUBCUTANEOUS
  Administered 2023-12-18: 2 [IU] via SUBCUTANEOUS
  Administered 2023-12-19 – 2023-12-20 (×7): 3 [IU] via SUBCUTANEOUS
  Administered 2023-12-20: 2 [IU] via SUBCUTANEOUS
  Administered 2023-12-20: 3 [IU] via SUBCUTANEOUS
  Administered 2023-12-20 (×2): 5 [IU] via SUBCUTANEOUS
  Administered 2023-12-21 (×2): 2 [IU] via SUBCUTANEOUS
  Administered 2023-12-21: 3 [IU] via SUBCUTANEOUS
  Administered 2023-12-22: 2 [IU] via SUBCUTANEOUS
  Administered 2023-12-22: 3 [IU] via SUBCUTANEOUS
  Administered 2023-12-22 (×2): 2 [IU] via SUBCUTANEOUS
  Administered 2023-12-23: 3 [IU] via SUBCUTANEOUS
  Administered 2023-12-23: 2 [IU] via SUBCUTANEOUS
  Administered 2023-12-24: 8 [IU] via SUBCUTANEOUS
  Administered 2023-12-24: 3 [IU] via SUBCUTANEOUS
  Administered 2023-12-24: 8 [IU] via SUBCUTANEOUS
  Administered 2023-12-24: 2 [IU] via SUBCUTANEOUS
  Administered 2023-12-25 (×3): 5 [IU] via SUBCUTANEOUS
  Administered 2023-12-25: 2 [IU] via SUBCUTANEOUS
  Administered 2023-12-25: 3 [IU] via SUBCUTANEOUS
  Administered 2023-12-25 – 2023-12-26 (×3): 5 [IU] via SUBCUTANEOUS
  Administered 2023-12-26 (×4): 3 [IU] via SUBCUTANEOUS
  Administered 2023-12-27: 2 [IU] via SUBCUTANEOUS
  Administered 2023-12-27: 3 [IU] via SUBCUTANEOUS
  Administered 2023-12-27: 5 [IU] via SUBCUTANEOUS
  Administered 2023-12-27 – 2023-12-28 (×6): 3 [IU] via SUBCUTANEOUS
  Administered 2023-12-28: 2 [IU] via SUBCUTANEOUS
  Administered 2023-12-28 – 2023-12-29 (×2): 3 [IU] via SUBCUTANEOUS
  Administered 2023-12-29 (×2): 2 [IU] via SUBCUTANEOUS
  Administered 2023-12-29 – 2023-12-30 (×3): 3 [IU] via SUBCUTANEOUS
  Administered 2023-12-30: 2 [IU] via SUBCUTANEOUS
  Administered 2023-12-30: 5 [IU] via SUBCUTANEOUS
  Administered 2023-12-30: 2 [IU] via SUBCUTANEOUS
  Administered 2023-12-30: 5 [IU] via SUBCUTANEOUS
  Administered 2023-12-31: 2 [IU] via SUBCUTANEOUS
  Administered 2023-12-31: 3 [IU] via SUBCUTANEOUS
  Administered 2023-12-31: 2 [IU] via SUBCUTANEOUS
  Administered 2024-01-01 (×2): 3 [IU] via SUBCUTANEOUS

## 2023-12-04 MED ORDER — ACETAMINOPHEN 160 MG/5ML PO SOLN: 650.0000 mg | ORAL | Status: DC | PRN

## 2023-12-04 MED ORDER — INSULIN ASPART 100 UNIT/ML IJ SOLN
0.0000 [IU] | INTRAMUSCULAR | Status: DC
  Administered 2023-12-04: 4 [IU] via SUBCUTANEOUS

## 2023-12-04 MED ORDER — ACETAMINOPHEN 160 MG/5ML PO SOLN
650.0000 mg | ORAL | Status: AC | PRN
  Administered 2023-12-04 – 2023-12-07 (×4): 650 mg
  Filled 2023-12-04 (×4): qty 20.3

## 2023-12-04 MED ORDER — POLYETHYLENE GLYCOL 3350 17 G PO PACK
17.0000 g | PACK | Freq: Every day | ORAL | Status: DC
  Administered 2023-12-04 – 2023-12-14 (×10): 17 g
  Filled 2023-12-04 (×10): qty 1

## 2023-12-04 MED ORDER — POTASSIUM CHLORIDE 20 MEQ PO PACK
40.0000 meq | PACK | Freq: Once | ORAL | Status: AC
  Administered 2023-12-04: 40 meq
  Filled 2023-12-04: qty 2

## 2023-12-04 MED ORDER — ACETAMINOPHEN 650 MG RE SUPP: 650.0000 mg | RECTAL | Status: AC | PRN

## 2023-12-04 MED ORDER — INSULIN ASPART 100 UNIT/ML IJ SOLN
5.0000 [IU] | INTRAMUSCULAR | Status: DC
  Administered 2023-12-04 – 2023-12-17 (×76): 5 [IU] via SUBCUTANEOUS

## 2023-12-04 NOTE — Progress Notes (Addendum)
Patient ID: William Todd, male   DOB: 05/27/1951, 73 y.o.   MRN: [id number] Vital signs are stable patient is more alert and talkative.  She is recovering well from her craniotomy.  CT today shows good stability.  Surgery not likely.

## 2023-12-04 NOTE — Progress Notes (Signed)
STROKE TEAM PROGRESS NOTE   SUBJECTIVE (INTERVAL HISTORY) His RN is at the bedside. Pt not open eyes but resisted forced eye opening, not able to check pupils bilaterally. Moving right UE and LE less than left. Still nonverbal. CT repeat today showed stable hematoma and MLS     OBJECTIVE Temp:  [97.7 F (36.5 C)-98.9 F (37.2 C)] 98.9 F (37.2 C) (02/20 1600) Pulse Rate:  [69-83] 74 (02/20 1600) Cardiac Rhythm: Normal sinus rhythm (02/20 0800) Resp:  [14-24] 15 (02/20 1600) BP: (115-157)/(59-77) 115/75 (02/20 1600) SpO2:  [98 %-100 %] 99 % (02/20 1600) Weight:  [56.8 kg] 56.8 kg (02/20 0500)  Recent Labs  Lab 12/03/23 2353 12/04/23 0354 12/04/23 0727 12/04/23 1114 12/04/23 1521  GLUCAP 222* 322* 351* 119* 96   Recent Labs  Lab 11/30/23 0400 12/01/23 0513 12/01/23 1610 12/02/23 0624 12/02/23 1444 12/03/23 0456 12/03/23 0929 12/03/23 2007 12/04/23 0110 12/04/23 0431 12/04/23 0828 12/04/23 1325  NA 141 140  --  134*   < > 147*   < > 154* 156* 151* 152* 160*  K 4.6 4.3  --  3.9  --  3.7  --   --   --  3.6  --   --   CL 105 104  --  97*  --  112*  --   --   --  117*  --   --   CO2 25 26  --  24  --  22  --   --   --  27  --   --   GLUCOSE 154* 141*  --  215*  --  173*  --   --   --  362*  --   --   BUN 29* 18  --  19  --  16  --   --   --  18  --   --   CREATININE 1.57* 1.29*  --  1.17  --  1.49*  --   --   --  1.32*  --   --   CALCIUM 9.0 9.2  --  8.6*  --  9.2  --   --   --  8.7*  --   --   MG  --   --  1.7 1.7  --  1.8  --   --   --   --   --   --   PHOS  --   --  3.7 3.4  --  3.0  --   --   --   --   --   --    < > = values in this interval not displayed.   Recent Labs  Lab 11/29/23 2342 11/30/23 0400  AST 43* 43*  ALT 51* 45*  ALKPHOS 89 90  BILITOT 0.6 0.6  PROT 6.6 6.1*  ALBUMIN 3.1* 2.9*   Recent Labs  Lab 11/29/23 2342 11/30/23 0400 12/01/23 0513 12/02/23 0624 12/03/23 0456 12/04/23 0431  WBC 10.5 9.9 8.6 8.9 7.7 6.8  NEUTROABS 8.9*  --    --   --   --   --   HGB 9.6* 8.7* 8.0* 8.0* 8.6* 7.8*  HCT 30.0* 26.7* 25.0* 23.9* 26.1* 24.2*  MCV 94.6 94.0 93.3 90.9 92.2 93.4  PLT 212 203 154 164 156 145*   No results for input(s): "CKTOTAL", "CKMB", "CKMBINDEX", "TROPONINI" in the last 168 hours. No results for input(s): "LABPROT", "INR" in the last 72 hours.       Component Value  Date/Time   CHOL 111 12/02/2023 0624   TRIG 63 12/02/2023 0624   HDL 41 12/02/2023 0624   CHOLHDL 2.7 12/02/2023 0624   VLDL 13 12/02/2023 0624   LDLCALC 57 12/02/2023 0624   Lab Results  Component Value Date   HGBA1C 6.1 (H) 12/02/2023     I have personally reviewed the radiological images below and agree with the radiology interpretations.  CT HEAD WO CONTRAST ( ) Result Date: 12/04/2023 CLINICAL DATA:  73 year old male with multifocal intracranial hemorrhage after a fall. Subsequent encounter. EXAM: CT HEAD WITHOUT CONTRAST TECHNIQUE: Contiguous axial images were obtained from the base of the skull through the vertex without intravenous contrast. RADIATION DOSE REDUCTION: This exam was performed according to the departmental dose-optimization program which includes automated exposure control, adjustment of the mA and/or kV according to patient size and/or use of iterative reconstruction technique. COMPARISON:  12/02/2023 and earlier. FINDINGS: Brain: Left anterior frontal lobe intra-axial hemorrhage remains hyperdense and amorphous. Due to scan angulation the most direct comparison now is on sagittal images (series 6, image 39 today versus the same series and image 2 days ago), where hemorrhage size and configuration appear unchanged. Regional edema and mass effect also unchanged. Scattered bilateral subarachnoid blood has not significantly changed. Small volume layering intraventricular hemorrhage has not significantly changed. Stable ventricle size and configuration, no ventriculomegaly. Stable mild rightward midline shift limited to the anterior  septum pellucidum. Basilar cisterns are stable. Stable gray-white matter differentiation throughout the brain. Left frontal lobe edema surrounding the hematoma and chronic right PCA territory infarct with encephalomalacia. Vascular: No suspicious intracranial vascular hyperdensity. Skull: Intact, stable. Incidental large maxillary torus exostoses incidentally noted. Sinuses/Orbits: Left nasoenteric tube now in place. Visualized paranasal sinuses and mastoids are stable and well aerated. Other: Stable orbit and scalp soft tissues. IMPRESSION: 1. Continued stability of large anterior left frontal lobar hemorrhage, associated edema. Relatively mild intracranial mass effect, scattered small volume bilateral SAH, and IVH remain stable. 2. No new intracranial abnormality. Chronic right PCA territory infarct. Electronically Signed   By: Odessa Fleming M.D.   On: 12/04/2023 06:37   EEG adult Result Date: 12/03/2023 Charlsie Quest, MD     12/03/2023  4:09 PM Patient Name: William Todd MRN: [id number] Epilepsy Attending: Charlsie Quest Referring Physician/Provider: Elmer Picker, NP Date: 12/03/2023 Duration: 23.45 mins Patient history: 73yo M with left frontal ICH. EEG to evaluate for seizure Level of alertness: Awake, asleep AEDs during EEG study: LEV Technical aspects: This EEG study was done with scalp electrodes positioned according to the 10-20 International system of electrode placement. Electrical activity was reviewed with band pass filter of 1-70Hz , sensitivity of 7 uV/mm, display speed of 39mm/sec with a 60Hz  notched filter applied as appropriate. EEG data were recorded continuously and digitally stored.  Video monitoring was available and reviewed as appropriate. Description: No clear posterior dominant rhythm was seen. Sleep was characterized by sleep spindles (12 to 14 Hz), maximal frontocentral region. EEG showed continuous generalized and maximal left frontal 3 to 6 Hz theta-delta slowing. Sharp waves were  noted in left frontal region,at times periodic at 1hz . Hyperventilation and photic stimulation were not performed.   ABNORMALITY - Sharp wave, left frontal region - Continuous slow, generalized and maximal left frontal region IMPRESSION: This study showed evidence of epileptogenicity and cortical dysfunction arising from left frontal region likely secondary to underlying ICH. Additionally there is moderate diffuse encephalopathy. No seizures were seen throughout the recording. If suspicion for ictal activity remains a  concern, a prolonged study can be considered. Charlsie Quest   DG Abd Portable 1V Result Date: 12/03/2023 CLINICAL DATA:  73 year old male feeding tube placement. EXAM: PORTABLE ABDOMEN - 1 VIEW COMPARISON:  12/01/2023 and earlier. FINDINGS: Portable AP semi upright view at 1038 hours. Enteric feeding tube is in place within the proximal stomach, looped at the level of the gastric fundus. Negative lung bases. Nonobstructed bowel gas pattern. No acute osseous abnormality identified. IMPRESSION: Enteric feeding tube placed into the proximal stomach. Electronically Signed   By: Odessa Fleming M.D.   On: 12/03/2023 11:19   CT HEAD WO CONTRAST ( ) Result Date: 12/02/2023 CLINICAL DATA:  73 year old male with multifocal intracranial hemorrhage status post fall. Hemorrhage progression after presentation. EXAM: CT HEAD WITHOUT CONTRAST TECHNIQUE: Contiguous axial images were obtained from the base of the skull through the vertex without intravenous contrast. RADIATION DOSE REDUCTION: This exam was performed according to the departmental dose-optimization program which includes automated exposure control, adjustment of the mA and/or kV according to patient size and/or use of iterative reconstruction technique. COMPARISON:  CTA head and neck this morning. Head CT yesterday and earlier. FINDINGS: Brain: Large volume mixed density but mostly hyperdense intra-axial hemorrhage in the anterior left frontal lobe  encompasses 78 by 44 x 63 mm (AP by transverse by CC) versus 79 x 45 x 61 mm yesterday when measured with the same technique. Volume estimated at 108 mL, versus 105 mL on 11/30/2023, and hemorrhage size and configuration appears visually stable. Superimposed scattered bilateral subarachnoid blood and a moderate volume of mostly now layering lateral intraventricular blood also appears stable. Rightward midline shift at the anterior septum pellucidum of 7-8 mm has increased from 5-6 mm on 11/30/2023. Ventricle size and configuration is stable. Stable basilar cistern patency. Superimposed chronic right PCA territory encephalomalacia and confluent bilateral cerebral white matter hypodensity. Vascular: Calcified atherosclerosis at the skull base. No suspicious intracranial vascular hyperdensity. Skull: Stable, intact. Sinuses/Orbits: Visualized paranasal sinuses and mastoids are stable and well aerated. Other: Stable, negative orbit and scalp soft tissues. IMPRESSION: 1. Large volume anterior frontal lobe intra-axial hemorrhage does not appear significantly changed in size or configuration since 11/30/2023. Using largest orthogonal dimensions, estimated blood volume is 108 mL now vs. 105 mL then. 2. Intracranial mass effect with mildly increased rightward midline shift since 11/30/2023, now 7-8 mm. Basilar cisterns remain patent. 3. Stable bilateral SAH and IVH. Stable ventricle size and configuration. 4. Chronic right PCA territory infarct. Nonspecific white matter changes. Electronically Signed   By: Odessa Fleming M.D.   On: 12/02/2023 11:47   CT ANGIO HEAD NECK W WO CM Result Date: 12/02/2023 CLINICAL DATA:  73 year old male with multifocal intracranial hemorrhage status post fall. EXAM: CT ANGIOGRAPHY HEAD AND NECK TECHNIQUE: Multidetector CT imaging of the head and neck was performed using the standard protocol during bolus administration of intravenous contrast. Multiplanar CT image reconstructions and MIPs were  obtained to evaluate the vascular anatomy. Carotid stenosis measurements (when applicable) are obtained utilizing NASCET criteria, using the distal internal carotid diameter as the denominator. RADIATION DOSE REDUCTION: This exam was performed according to the departmental dose-optimization program which includes automated exposure control, adjustment of the mA and/or kV according to patient size and/or use of iterative reconstruction technique. CONTRAST:  75mL OMNIPAQUE IOHEXOL 350 MG/ML SOLN COMPARISON:  Head CTs 12/01/2023 and earlier. Brain MRI 11/15/2023. FINDINGS: CTA NECK Skeleton: Cervical spine degeneration. 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 No acute osseous abnormality identified. Upper chest:  Upper lungs are clear. Negative visible superior mediastinum. Other neck: Neck soft tissue spaces appear within normal limits. Aortic arch: Calcified aortic atherosclerosis.  3 vessel arch. Right carotid system: No significant brachiocephalic artery or or right CCA origin atherosclerosis. Soft and calcified plaque of the right CCA before the bifurcation without stenosis. Bulky calcified atherosclerosis of the proximal right ICA beginning at the origin but most pronounced at the bulb. Subsequent distal bulb stenosis is numerically estimated at 60 % with respect to the distal vessel (series 7, image 98 and series 9, image 155). Right ICA remains patent to the skull base. Left carotid system: Minimal left CCA atherosclerosis before the bifurcation. Soft and calcified plaque at the left ICA origin and more pronounced at the bulb. Up to 50 % stenosis with respect to the distal vessel. Additional calcified plaque of the left ICA just below the skull base without stenosis. Vertebral arteries: Mild proximal right subclavian atherosclerosis without stenosis. Calcified plaque at the right vertebral artery origin with only mild stenosis on series 8, image 145. Right  vertebral is patent to the skull base with no significant plaque or stenosis. Proximal left subclavian artery soft and calcified plaque without stenosis. Mostly soft plaque at the left vertebral artery origin with up to moderate origin stenosis on series 8, image 135. Left vertebral remains patent and is fairly codominant in the neck. Mild left V3 segment calcified plaque with no additional significant stenosis to the skull base. CTA HEAD Posterior circulation: Bilateral V4 segment atherosclerosis. Bulky and severe right V4 calcified plaque and stenosis on series 5, image 145. The right vertebrobasilar junction remains patent. Contralateral moderate left V4 segment stenosis proximal to the left PICA origin which remains normal. Right PICA origin is patent. Patent left vertebrobasilar junction. Patent basilar artery without stenosis. Fetal type bilateral PCA origins. Patent SCA origins. Bilateral PCA branches are within normal limits. Anterior circulation: Both ICA siphons are patent. Left siphon cavernous segment calcified plaque is moderate with mild to moderate stenosis at the anterior genu. Normal left posterior communicating artery origin. Right ICA cavernous through supraclinoid segment calcified plaque is moderate with mild to moderate supraclinoid stenosis. Normal right posterior communicating artery origin. Patent carotid termini. Patent MCA origins. Dominant right and diminutive or absent left ACA A1 segments. Anterior communicating artery and bilateral ACA branches are within normal limits. Left MCA M1 segment and bifurcation are patent without stenosis. There is mild mass effect on the left MCA branches which otherwise appear within normal limits. Right MCA M1 segment and trifurcation are patent without stenosis. Right MCA branches are within normal limits. No CTA spot sign or abnormal vascularity associated with the left anterior superior frontal lobe intra-axial hemorrhage. Other findings: Grossly  stable intracranial hemorrhage, mass effect from the head CT yesterday. Venous sinuses: Early contrast timing. Superior sagittal sinus, torcula, straight sinus, vein of Galen and internal cerebral veins, dominant appearing right transverse and sigmoid venous sinuses appear to be enhancing and patent. Anatomic variants: Fetal type bilateral PCA origins. Dominant right ACA A1. Review of the MIP images confirms the above findings IMPRESSION: 1. Negative for CTA spot sign in association with acute intracranial hemorrhage. Mild mass effect on left MCA vasculature. Negative for intracranial aneurysm, vascular malformation, or large vessel occlusion. 2. Atherosclerosis in the head and neck notable for: - 60% stenosis of the Right ICA bulb. - Moderate To Severe bilateral distal vertebral artery stenosis. - Moderate Left Vertebral Artery origin stenosis. - mild to moderate bilateral ICA siphon stenosis. 3.  Aortic Atherosclerosis (ICD10-I70.0). Electronically Signed   By: Odessa Fleming M.D.   On: 12/02/2023 06:41   DG Abd Portable 1V Result Date: 12/01/2023 CLINICAL DATA:  NG placement. EXAM: PORTABLE ABDOMEN - 1 VIEW COMPARISON:  CT abdomen pelvis dated 07/01/2013. FINDINGS: Feeding tube with tip in the left upper abdomen likely in the region of the gastric fundus. Large amount of stool throughout the colon. IMPRESSION: Feeding tube with tip in the region of the gastric fundus. Electronically Signed   By: Elgie Collard M.D.   On: 12/01/2023 18:55   CT HEAD WO CONTRAST ( ) Result Date: 12/01/2023 CLINICAL DATA:  Follow-up of intracranial hemorrhage. EXAM: CT HEAD WITHOUT CONTRAST TECHNIQUE: Contiguous axial images were obtained from the base of the skull through the vertex without intravenous contrast. RADIATION DOSE REDUCTION: This exam was performed according to the departmental dose-optimization program which includes automated exposure control, adjustment of the mA and/or kV according to patient size and/or use of  iterative reconstruction technique. COMPARISON:  None Available. FINDINGS: Brain: Redemonstrated intraparenchymal hematoma in the anterior left frontal lobe which measures 7.9 x 4.9 x 3.6 cm, previously measuring 7.8 x 5.2 x 3.4 cm when remeasured in a similar manner. Similar surrounding edema and associated mass effect. Approximately 5 mm rightward midline shift anteriorly similar to prior. Extension of hemorrhage into the adjacent left frontal horn with similar appearance of blood products in the left lateral ventricle. Increased blood products within the atrium and occipital horn of the right lateral ventricle. Slightly decreased blood products in the third ventricle. Small focus of subdural hemorrhage along the anterior falx. Scattered areas of subarachnoid hemorrhage along the right frontal lobe and right sylvian fissure again noted. Remote infarct in the right occipital lobe. Vascular: No hyperdense vessel or unexpected calcification. Skull: Normal. Negative for fracture or focal lesion. Sinuses/Orbits: No acute finding. Other: Mastoid air cells are clear. IMPRESSION: Similar appearance of intraparenchymal hematoma in the left frontal lobe with associated mass effect and 5 mm rightward midline shift. Additional small multi compartment intracranial hemorrhage as above. Slightly increased blood products within the atrium and occipital horn of the right lateral ventricle. Electronically Signed   By: Emily Filbert M.D.   On: 12/01/2023 13:34   ECHOCARDIOGRAM COMPLETE Result Date: 11/30/2023    ECHOCARDIOGRAM REPORT   Patient Name:   William Todd Date of Exam: 11/30/2023 Medical Rec #:  [id number]        Height:       72.0 in Accession #:    [id number]       Weight:       147.0 lb Date of Birth:  [birth year]         BSA:          1.869 m Patient Age:    72 years         BP:           129/68 mmHg Patient Gender: M                HR:           77 bpm. Exam Location:  Inpatient Procedure: 2D Echo (Both Spectral and  Color Flow Doppler were utilized during            procedure). Indications:    intracerebral hemorrhage  History:        Patient has prior history of Echocardiogram examinations, most  recent 01/02/2013. Risk Factors:Hypertension, Diabetes and                 Dyslipidemia.  Sonographer:    Delcie Roch RDCS Referring Phys: [id number] WHITNEY D HARRIS IMPRESSIONS  1. Left ventricular ejection fraction, by estimation, is 60 to 65%. The left ventricle has normal function. The left ventricle has no regional wall motion abnormalities. Left ventricular diastolic parameters were normal.  2. Right ventricular systolic function is normal. The right ventricular size is normal. There is mildly elevated pulmonary artery systolic pressure.  3. The mitral valve is degenerative. Mild mitral valve regurgitation. No evidence of mitral stenosis.  4. The aortic valve is tricuspid. There is mild calcification of the aortic valve. Aortic valve regurgitation is not visualized. Aortic valve sclerosis is present, with no evidence of aortic valve stenosis.  5. The inferior vena cava is normal in size with greater than 50% respiratory variability, suggesting right atrial pressure of 3 mmHg. FINDINGS  Left Ventricle: Left ventricular ejection fraction, by estimation, is 60 to 65%. The left ventricle has normal function. The left ventricle has no regional wall motion abnormalities. Strain imaging was not performed. The left ventricular internal cavity  size was normal in size. There is no left ventricular hypertrophy. Left ventricular diastolic parameters were normal. Right Ventricle: The right ventricular size is normal. No increase in right ventricular wall thickness. Right ventricular systolic function is normal. There is mildly elevated pulmonary artery systolic pressure. The tricuspid regurgitant velocity is 2.89  m/s, and with an assumed right atrial pressure of 3 mmHg, the estimated right ventricular systolic pressure is  36.4 mmHg. Left Atrium: Left atrial size was normal in size. Right Atrium: Right atrial size was normal in size. Pericardium: There is no evidence of pericardial effusion. Mitral Valve: The mitral valve is degenerative in appearance. There is mild calcification of the anterior mitral valve leaflet(s). Mild mitral valve regurgitation. No evidence of mitral valve stenosis. Tricuspid Valve: The tricuspid valve is grossly normal. Tricuspid valve regurgitation is mild . No evidence of tricuspid stenosis. Aortic Valve: The aortic valve is tricuspid. There is mild calcification of the aortic valve. Aortic valve regurgitation is not visualized. Aortic valve sclerosis is present, with no evidence of aortic valve stenosis. Pulmonic Valve: The pulmonic valve was grossly normal. Pulmonic valve regurgitation is not visualized. No evidence of pulmonic stenosis. Aorta: The aortic root and ascending aorta are structurally normal, with no evidence of dilitation. Venous: The inferior vena cava is normal in size with greater than 50% respiratory variability, suggesting right atrial pressure of 3 mmHg. IAS/Shunts: The atrial septum is grossly normal. Additional Comments: 3D imaging was not performed.  LEFT VENTRICLE PLAX 2D LVIDd:         3.20 cm   Diastology LVIDs:         1.70 cm   LV e' medial:    8.16 cm/s LV PW:         1.20 cm   LV E/e' medial:  10.7 LV IVS:        1.00 cm   LV e' lateral:   11.20 cm/s LVOT diam:     2.40 cm   LV E/e' lateral: 7.8 LV SV:         115 LV SV Index:   61 LVOT Area:     4.52 cm  RIGHT VENTRICLE             IVC RV Basal diam:  2.80 cm  IVC diam: 1.70 cm RV S prime:     12.90 cm/s TAPSE (M-mode): 2.2 cm LEFT ATRIUM             Index        RIGHT ATRIUM           Index LA diam:        3.30 cm 1.77 cm/m   RA Area:     13.00 cm LA Vol (A2C):   45.6 ml 24.39 ml/m  RA Volume:   29.50 ml  15.78 ml/m LA Vol (A4C):   39.1 ml 20.92 ml/m LA Biplane Vol: 43.8 ml 23.43 ml/m  AORTIC VALVE LVOT Vmax:    125.00 cm/s LVOT Vmean:  83.200 cm/s LVOT VTI:    0.254 m  AORTA Ao Root diam: 3.30 cm Ao Asc diam:  3.20 cm MITRAL VALVE               TRICUSPID VALVE MV Area (PHT): 3.37 cm    TR Peak grad:   33.4 mmHg MV Decel Time: 225 msec    TR Vmax:        289.00 cm/s MR Peak grad: 113.2 mmHg MR Mean grad: 73.0 mmHg    SHUNTS MR Vmax:      532.00 cm/s  Systemic VTI:  0.25 m MR Vmean:     403.0 cm/s   Systemic Diam: 2.40 cm MV E velocity: 87.30 cm/s MV A velocity: 70.40 cm/s MV E/A ratio:  1.24 Lennie Odor MD Electronically signed by Lennie Odor MD Signature Date/Time: 11/30/2023/8:57:24 PM    Final    CT Head Wo Contrast Result Date: 11/30/2023 CLINICAL DATA:  Follow-up head trauma EXAM: CT HEAD WITHOUT CONTRAST TECHNIQUE: Contiguous axial images were obtained from the base of the skull through the vertex without intravenous contrast. RADIATION DOSE REDUCTION: This exam was performed according to the departmental dose-optimization program which includes automated exposure control, adjustment of the mA and/or kV according to patient size and/or use of iterative reconstruction technique. COMPARISON:  Head CT from the same day at 2 a.m. FINDINGS: Brain: Growing hematoma in the anterior left frontal lobe, hemorrhagic area measuring 7.3 x 5 x 3.4 cm. The hemorrhage has now decompressed into the left lateral ventricle with tracking to the level of the fourth ventricle. No hydrocephalus. Patchy subarachnoid hemorrhage along the frontal lobes and sylvian fissures which appears similar. The hemorrhage and edema causes progressive swelling with anterior midline shift measuring 5 mm. Chronic right occipital infarct. Low-density in the cerebral white matter from chronic small vessel ischemia. Vascular: No hyperdense vessel or unexpected calcification. Skull: No acute finding Sinuses/Orbits: No evidence of injury Critical Value/emergent results were called by telephone at the time of interpretation on 11/30/2023 at 8:45 am to  provider [place], who verbally acknowledged these results. IMPRESSION: Progressive left frontal hematoma now measuring 60 cc with new intraventricular extension reaching the fourth ventricle. Patchy bilateral subarachnoid hemorrhage appears similar to prior. The growing hematoma and edema causes anterior midline shift by 5 mm. No hydrocephalus. Electronically Signed   By: Tiburcio Pea M.D.   On: 11/30/2023 08:46   CT Head Wo Contrast Result Date: 11/30/2023 CLINICAL DATA:  Head trauma, minor (Age >= 65y); Neck trauma (Age >= 65y) EXAM: CT HEAD WITHOUT CONTRAST CT CERVICAL SPINE WITHOUT CONTRAST TECHNIQUE: Multidetector CT imaging of the head and cervical spine was performed following the standard protocol without intravenous contrast. Multiplanar CT image reconstructions of the cervical spine were also generated. RADIATION DOSE REDUCTION: This  exam was performed according to the departmental dose-optimization program which includes automated exposure control, adjustment of the mA and/or kV according to patient size and/or use of iterative reconstruction technique. COMPARISON:  MRI head November 15, 2023. FINDINGS: CT HEAD FINDINGS Brain: Acute intraparenchymal hemorrhage in the left frontal lobe measuring 2.6 cm with overlying extra-axial extension. Small volume of multifocal subarachnoid hemorrhage bilaterally along the right frontal convexity, left frontal convexity and layering within the left sylvian fissure. No midline shift. No hydrocephalus. Remote right occipital infarct. Vascular: Calcific atherosclerosis.  No dense vessel. Skull: No acute fracture. Sinuses/Orbits: Clear sinuses.  No acute orbital findings. Other: No mastoid effusions. CT CERVICAL SPINE FINDINGS Alignment: No substantial sagittal subluxation. Skull base and vertebrae: No acute fracture. Soft tissues and spinal canal: No prevertebral fluid or swelling. No visible canal hematoma. Disc levels:  Moderate multilevel degenerative change.  Upper chest: Visualized lung apices are clear. IMPRESSION: 1. Acute intraparenchymal hemorrhage in left frontal lobe with multifocal bilateral small volume subarachnoid hemorrhage. No midline shift. 2. No acute fracture or traumatic malalignment in the cervical spine. Findings discussed with Dr. Clayborne Dana via telephone at 2:19 a.m. Electronically Signed   By: Feliberto Harts M.D.   On: 11/30/2023 02:21   CT Cervical Spine Wo Contrast Result Date: 11/30/2023 CLINICAL DATA:  Head trauma, minor (Age >= 65y); Neck trauma (Age >= 65y) EXAM: CT HEAD WITHOUT CONTRAST CT CERVICAL SPINE WITHOUT CONTRAST TECHNIQUE: Multidetector CT imaging of the head and cervical spine was performed following the standard protocol without intravenous contrast. Multiplanar CT image reconstructions of the cervical spine were also generated. RADIATION DOSE REDUCTION: This exam was performed according to the departmental dose-optimization program which includes automated exposure control, adjustment of the mA and/or kV according to patient size and/or use of iterative reconstruction technique. COMPARISON:  MRI head November 15, 2023. FINDINGS: CT HEAD FINDINGS Brain: Acute intraparenchymal hemorrhage in the left frontal lobe measuring 2.6 cm with overlying extra-axial extension. Small volume of multifocal subarachnoid hemorrhage bilaterally along the right frontal convexity, left frontal convexity and layering within the left sylvian fissure. No midline shift. No hydrocephalus. Remote right occipital infarct. Vascular: Calcific atherosclerosis.  No dense vessel. Skull: No acute fracture. Sinuses/Orbits: Clear sinuses.  No acute orbital findings. Other: No mastoid effusions. CT CERVICAL SPINE FINDINGS Alignment: No substantial sagittal subluxation. Skull base and vertebrae: No acute fracture. Soft tissues and spinal canal: No prevertebral fluid or swelling. No visible canal hematoma. Disc levels:  Moderate multilevel degenerative change. Upper  chest: Visualized lung apices are clear. IMPRESSION: 1. Acute intraparenchymal hemorrhage in left frontal lobe with multifocal bilateral small volume subarachnoid hemorrhage. No midline shift. 2. No acute fracture or traumatic malalignment in the cervical spine. Findings discussed with Dr. Clayborne Dana via telephone at 2:19 a.m. Electronically Signed   By: Feliberto Harts M.D.   On: 11/30/2023 02:21   DG Chest Portable 1 View Result Date: 11/30/2023 CLINICAL DATA:  Fall EXAM: PORTABLE CHEST 1 VIEW COMPARISON:  03/05/2012 FINDINGS: There is asymmetric airspace infiltrate within the right mid lung zone, possibly infectious or posttraumatic given the history of recent trauma. No pneumothorax or pleural effusion. Cardiac size within normal limits. Pulmonary vascularity is normal. No acute bone abnormality. IMPRESSION: 1. Asymmetric airspace infiltrate within the right mid lung zone, possibly infectious or posttraumatic given the history of recent trauma. Electronically Signed   By: Helyn Numbers M.D.   On: 11/30/2023 01:18   DG Pelvis Portable Result Date: 11/30/2023 CLINICAL DATA:  Fall EXAM: PORTABLE PELVIS  1-2 VIEWS COMPARISON:  None Available. FINDINGS: Left total hip arthroplasty has been performed. No acute fracture or dislocation. Mild right hip degenerative arthritis. Vascular calcifications noted. Soft tissues are otherwise unremarkable. IMPRESSION: 1. Left total hip arthroplasty. No acute fracture or dislocation. Electronically Signed   By: Helyn Numbers M.D.   On: 11/30/2023 01:17   DG HIP UNILAT W OR W/O PELVIS 2-3 VIEWS LEFT Result Date: 11/16/2023 CLINICAL DATA:  [id number] History of left hip hemiarthroplasty [id number] EXAM: DG HIP (WITH OR WITHOUT PELVIS) 2-3V LEFT COMPARISON:  11/15/2023 FINDINGS: Interval left hip arthroplasty, components projecting in expected location. No fracture or dislocation. Surgical clips project over the pubic bones. Patchy iliofemoral arterial calcifications. IMPRESSION:  Left hip arthroplasty, without apparent complication. Electronically Signed   By: Corlis Leak M.D.   On: 11/16/2023 16:11   MR BRAIN WO CONTRAST Result Date: 11/15/2023 CLINICAL DATA:  Abnl CT Head, evaluate for pontine infarct EXAM: MRI HEAD WITHOUT CONTRAST TECHNIQUE: Multiplanar, multiecho pulse sequences of the brain and surrounding structures were obtained without intravenous contrast. COMPARISON:  CT head from today. FINDINGS: Brain: No acute infarction, hemorrhage, hydrocephalus, extra-axial collection or mass lesion. Remote right PCA territory infarct. Moderate T2/FLAIR hyperintensities in the white matter, compatible with chronic microvascular ischemic disease. Vascular: Major arterial flow voids are maintained at the skull base. Skull and upper cervical spine: Normal marrow signal. Sinuses/Orbits: Negative. Other: No sizable mastoid effusions. IMPRESSION: 1. No evidence of acute intracranial abnormality. 2. Remote right PCA territory infarct. Electronically Signed   By: Feliberto Harts M.D.   On: 11/15/2023 22:38   CT Head Wo Contrast Result Date: 11/15/2023 CLINICAL DATA:  Altered mental status, fall EXAM: CT HEAD WITHOUT CONTRAST TECHNIQUE: Contiguous axial images were obtained from the base of the skull through the vertex without intravenous contrast. RADIATION DOSE REDUCTION: This exam was performed according to the departmental dose-optimization program which includes automated exposure control, adjustment of the mA and/or kV according to patient size and/or use of iterative reconstruction technique. COMPARISON:  None Available. FINDINGS: Brain: No acute territorial infarction, hemorrhage or intracranial mass. 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 Atrophy and advanced chronic small vessel ischemic changes of the white matter. Hypodensity at the pons, series 2, image 11 and sagittal series 6 image 26. Ventricles are nonenlarged Vascular: No hyperdense  vessels. Carotid and vertebral calcifications. Skull: Normal. Negative for fracture or focal lesion. Sinuses/Orbits: No acute finding. Other: None IMPRESSION: 1. Negative for acute intracranial hemorrhage or mass. 2. Hypodensity at the pons, though some of this may be related to artifact, the finding is rounded with some sparing of peripheral tissue and acute or subacute pontine infarct cannot be excluded. MRI is recommended for further evaluation. 3. Atrophy and chronic small vessel ischemic changes of the white matter Electronically Signed   By: Jasmine Pang M.D.   On: 11/15/2023 18:51   DG Ankle Complete Left Result Date: 11/15/2023 CLINICAL DATA:  Fall with ankle pain EXAM: LEFT ANKLE COMPLETE - 3+ VIEW COMPARISON:  01/30/2012 FINDINGS: Lateral plate and multiple fixating screws with additional medial malleolar fixating screws. Chronic postsurgical, posttraumatic and post infectious changes involving the distal tibia, distal fibula and tibiotalar joint. Fused appearance across the tibiotalar joint and probable subtalar fusion as well. No definite acute fracture is seen. IMPRESSION: Chronic postsurgical, posttraumatic and post infectious changes involving the distal tibia, distal fibula and tibiotalar joint. No definite acute osseous abnormality. Electronically Signed   By: Adrian Prows.D.  On: 11/15/2023 18:09   DG Foot Complete Left Result Date: 11/15/2023 CLINICAL DATA:  Larey Seat 2 weeks ago with left foot pain. EXAM: LEFT FOOT - COMPLETE 3+ VIEW COMPARISON:  Left ankle 01/30/2012 FINDINGS: Diffuse bone demineralization. Degenerative changes in the interphalangeal joints, metatarsal-phalangeal, tarsometatarsal, and interphalangeal joints. Postoperative fusion of the ankle joint with plate and screw fixation. No evidence of acute fracture or dislocation. Soft tissues are unremarkable. IMPRESSION: 1. No acute bony abnormalities. 2. Degenerative changes in the left foot. 3. Postoperative changes with  surgical fusion of the left ankle including plate and screw fixation Electronically Signed   By: Burman Nieves M.D.   On: 11/15/2023 18:03   DG Hip Unilat W or Wo Pelvis 2-3 Views Left Result Date: 11/15/2023 CLINICAL DATA:  Fall 2 weeks ago. Dizziness with fall forward. Left foot pain. EXAM: DG HIP (WITH OR WITHOUT PELVIS) 2-3V LEFT COMPARISON:  None Available. FINDINGS: Transverse subcapital fracture of the left femoral neck with varus angulation of the fracture fragments. No inter trochanteric involvement is identified. No dislocation at the hip joint. Pelvis appears intact. SI joints and symphysis pubis are not displaced. No destructive or expansile bone lesions. Degenerative changes in the lower lumbar spine and both hips. Vascular calcifications. IMPRESSION: Acute transverse fracture of the left femoral neck with varus angulation of the fracture fragments. Degenerative changes. Electronically Signed   By: Burman Nieves M.D.   On: 11/15/2023 18:02     PHYSICAL EXAM  Temp:  [97.7 F (36.5 C)-98.9 F (37.2 C)] 98.9 F (37.2 C) (02/20 1600) Pulse Rate:  [69-83] 74 (02/20 1600) Resp:  [14-24] 15 (02/20 1600) BP: (115-157)/(59-77) 115/75 (02/20 1600) SpO2:  [98 %-100 %] 99 % (02/20 1600) Weight:  [56.8 kg] 56.8 kg (02/20 0500)  General - well nourished, well developed, in no apparent distress.     Ophthalmologic - fundi not visualized due to noncooperation.     Cardiovascular - regular rhythm and rate   Neuro - eyes not open on voice, but resisted forced opening, nonverbal and not following commands. Not able to check eye gaze or pupil today due to resisting forced eye opening. Mild right facial droop. Tongue protrusion or mouth open not cooperative. Withdraw to pain on all extremities but more so on the left. Sensation, coordination and gait not tested.   ASSESSMENT/PLAN William Todd is a 73 y.o. male with PMH of DM, HTN, HLD, prostate cancer, left foot fracture with  prosthesis, dementia and recent fall with acute transverse fracture of the left femoral neck s/p hemiarthroplasty on 2/2. Post op delirium/confusion but MRI at that time no acute finding, no stroke or hemorrhage. No sign of CAA. He was put on ASA 81 bid for 4 weeks for DVT prophylaxis. He was discharged to SNF on 11/20/23. Readmitted on 2/15 with fall at SNF and left eyebrow laceration. CT head showed left frontal small ICH with bilateral small SAH, concerning for traumatic ICH with contusion. However repeat CT 2/16 showed significant enlarged hematoma on the left frontal area with multi-lobe configuration and IVH and SAH but no hydro with 5mm MLS.     ICH:  left frontal large ICH, etiology unclear, traumatic ICH vs. Spontaneous ICH. Less likely for hemorrhagic infarct CT head 2/16 left frontal small ICH with bilateral small SAH, concerning for traumatic ICH with contusion.  Repeat CT 2/16 showed significant enlarged hematoma on the left frontal area with multi-lobe configuration and IVH and SAH but no hydro with 5mm MLS.  Repeat CT 2/17 Similar appearance of intraparenchymal hematoma in the left frontal lobe with associated mass effect and 5 mm rightward midline shift. CTA head and neck - no spot sign. 60% R ICA bulb, b/l distal VA mod to severe stenosis,  mod left VA origin stenosis and mild to mod b/l ICA siphon CT repeat 2/18 large left frontal ICH 108cc vs. 2/17 105cc but with MLS of 7-8 mm.  Consider MRI with and without contrast Saturday CT repeat 2/20 stable hematoma and MLS 2D Echo  EF 60-65% EEG left frontal sharp waves,  moderate diffuse encephalopathy, no seizure LDL 57 HgbA1c 6.1 SCDs for VTE prophylaxis aspirin 81 mg daily bid prior to admission, now on No antithrombotic.  Keppra 500 -> 750 mg bid Ongoing aggressive stroke risk factor management Therapy recommendations:  SNF Disposition:  pending  Cerebral edema Repeat CT 2/17 5 mm rightward midline shift. CT repeat 2/18 MLS of  7-8 mm.  CT repeat 2/20 stable hematoma and midline shift Na 140-134-147-151-154-152 Put on 3% saline @ 50-> NS @ 50 Bolus with 23.4% x 1 Na monitoring with goal 150-155  Diabetes HgbA1c 6.1 goal < 7.0 Controlled CBG monitoring SSI DM education and close PCP follow up  Hypertension Stable on the high end On norvasc 10 and metoprolol 50 bid Off IV metoprolol BP goal < 160 Long term BP goal normotensive  Hyperlipidemia Home meds:  lipitor 40  LDL 57, goal < 70 Consider to resume lipitor on discharge  AKI Creatinine 1.29--1.17--1.49--1.32 On IV fluid Monitoring BMP  Other Stroke Risk Factors Advanced age  Other Active Problems Anemia, hemoglobin 8.7--8.0--8.6--7.8, CBC monitoring Wife denies hx of dementia and stated that pt was functional before the fall earlier this month Recent fracture - fall with acute transverse fracture of the left femoral neck with varus angulation of the fracture fragments. Underwent hemiarthroplasty on 2/2.   Hospital day # 4  This patient is critically ill due to large frontal ICH, cerebral edema and at significant risk of neurological worsening, death form brain herniation, seizure. This patient's care requires constant monitoring of vital signs, hemodynamics, respiratory and cardiac monitoring, review of multiple databases, neurological assessment, discussion with family, other specialists and medical decision making of high complexity. I spent 35 minutes of neurocritical care time in the care of this patient.   Marvel Plan, MD PhD Stroke Neurology 12/04/2023 5:26 PM    To contact Stroke Continuity provider, please refer to [email or web address]. After hours, contact General Neurology

## 2023-12-04 NOTE — Progress Notes (Signed)
NAME:  William Todd, MRN:  [id number], DOB:  03-Apr-1951, LOS: 4 ADMISSION DATE:  11/29/2023, CONSULTATION DATE:  11/29/2023 REFERRING MD:  Dr. Clayborne Dana - EDP, CHIEF COMPLAINT:  SAH with IPH   History of Present Illness:  William Todd is a 73 y.o. male with a past medical history significant for HTN, recurrent mechanical falls, protein calorie malnutrition, type 2 diabetes, chronic kidney disease, and prostate cancer 2014 who presented to the ED via EMS from [facility] after suffering ground-level fall at [facility].  Workup on admission revealed acute intraparenchymal hemorrhage of the left frontal lobe with multiple bilateral small volume SAH doubt shift, neurosurgery was consulted with no surgical interventions recommended.  Patient was admitted initially per [facility].   While boarding in the emergency department patient had repeat CT scan to follow-up on ICH which revealed progressive left frontal hematoma with intraventricular extension into the fourth ventricle resulting in anterior midline shift of 5 mm, no hydrocephalus.  Neurosurgery reviewed imaging and recommended admission to ICU under PCCM for close monitoring.   Pertinent  Medical History  HTN, recurrent mechanical falls, protein calorie malnutrition, type 2 diabetes, chronic kidney disease, and prostate cancer 2014   Significant Hospital Events: Including procedures, antibiotic start and stop dates in addition to other pertinent events   2/15 presented after ground-level fall from SNF workup revealed ICH with bilateral [facility] 2/16 repeat CT while boarding in ED revealed progressive left frontal hematoma with intraventricular extension into the fourth ventricle resulting in anterior midline shift of 5 mm, no hydrocephalus.  Admission changed to ICU 2/18-CT angio, CT head with enlarging clot 2/20 - Stable left frontal hematoma with mild mass effect.  Interim History / Subjective:  Neurologically unchanged.  Resists with normal strength  throughout but not following commands. Hypertonic saline was stopped. Objective   Blood pressure 133/69, pulse 83, temperature 98.4 F (36.9 C), temperature source Oral, resp. rate 17, weight 56.8 kg, SpO2 100%.        Intake/Output Summary (Last 24 hours) at 12/04/2023 0945 Last data filed at 12/04/2023 0800 Gross per 24 hour  Intake 2321.67 ml  Output 2800 ml  Net -478.33 ml   Filed Weights   12/02/23 0702 12/03/23 0500 12/04/23 0500  Weight: 61.9 kg 57.9 kg 56.8 kg    Examination: General: Thin man.  Lying in bed. HEENT: Moist oral mucosa Neuro: Responds symmetrically with normal strength in all 4 limbs to painful stimulation.  Resists eye opening.  Winces to pain.  Remains nonverbal. CV: Normal heart sounds.  Extremities warm. PULM: No respiratory distress.  Chest clear bilaterally. GI: Soft with no distention. Extremities: warm/dry, no edema  Skin: no rashes or lesions   Ancillary test personally reviewed  Large left frontal intraparenchymal hemorrhage of different densities.  Some subarachnoid bleeding.  Imaging stable from prior study. Sodium 152.  Creatinine 1.32 Hyperglycemic on tube feeds. Anemia of chronic disease 7.8.  Assessment & Plan:   Left frontal intracerebral traumatic hematoma Bilateral subarachnoid hemorrhages secondary to trauma History of mood disorder History of memory impairment CKD stage IIIb Hypertension Type 2 diabetes History of frailty with recurrent mechanical falls.  Plan:  -Hematoma appears stable as does neurological examination.  Recovery will be protracted and degree of improvement as yet uncertain. -If exam remains stable by tomorrow can transfer to PCU -3% saline has been stopped.  Will allow sodium to drift down. -Hydralazine started to optimize blood pressure control.  Aiming for long-term systolic blood pressure less than 140. -  Tube feed coverage started. -Avoid benzodiazepines for agitation as may contribute to furthering  delirium.  Best Practice (right click and "Reselect all SmartList Selections" daily)   Diet/type: On tube feeds via smallbore feeding tube. DVT prophylaxis SCD can restart chemical prophylaxis given stable imaging. Pressure ulcer(s): N/A GI prophylaxis: PPI Lines: N/A Foley:  N/A Code Status:  full code Last date of multidisciplinary goals of care discussion: pending   Lynnell Catalan, MD [facility] ICU Physician [facility]  Pager: [phone] Or Epic Secure Chat After hours: [phone].  12/04/2023, 9:54 AM

## 2023-12-04 NOTE — Progress Notes (Signed)
Physical Therapy Treatment Patient Details Name: William Todd MRN: [id number] DOB: 1950/10/25 Today's Date: 12/04/2023   History of Present Illness Pt is 73 yo male who returns on 11/29/23 from [facility] after falling on L side where he just had hip sx. Xray of pelvis shows stable hip, CT of head showed acute IPH of L frontal lobe and bilateral small SAH. Cognitive status declined and pt found to have L frontal hematoma with extension into 4th ventricle.  PMH: HTN, recurrent mechanical falls, protein calorie malnutrition, DM2, CKD, and prostate cancer 2014, L foot fx with AFO,  L THA 11/16/23, anxiety, dementia    PT Comments  This is third time this therapist has seen pt this week and cognition has declined. Session today significantly limited by pt being focused on pulling out cortrack so eventually wrist restraints had to be replaced after UR ROM. Pt not following any commands. Recoils from touch to R side of body. Did tolerate some ROM to RLE but did not like R foot touched. Is keeping L hip in increased flexion in bed compared to earlier in week and is very tight B hip adductors. Patient will benefit from continued inpatient follow up therapy, <3 hours/day. PT will continue to follow.     If plan is discharge home, recommend the following: Assistance with cooking/housework;Direct supervision/assist for medications management;Direct supervision/assist for financial management;Assist for transportation;Help with stairs or ramp for entrance;Supervision due to cognitive status;A lot of help with walking and/or transfers;A lot of help with bathing/dressing/bathroom   Can travel by private vehicle     No  Equipment Recommendations  None recommended by PT    Recommendations for Other Services       Precautions / Restrictions Precautions Precautions: Fall;Posterior Hip (from 11/16/23) Precaution Booklet Issued: No Recall of Precautions/Restrictions: Impaired Required Braces or Orthoses:  Other Brace Other Brace: L AFO Restrictions Weight Bearing Restrictions Per Provider Order: No LLE Weight Bearing Per Provider Order: Weight bearing as tolerated     Mobility  Bed Mobility Overal bed mobility: Needs Assistance             General bed mobility comments: initiated supine to sit but once pt's wrist restraints were removed pt was trying to pull at coretrack. Replaced mittens to try to continue mobility but was getting tube even with the mittens. Pt had to be rerestrained due to this. Bed placed in chair for LE ther ex    Transfers                   General transfer comment: could not attempt today due to pt immediately trying to get coretrack whenever UE's were free    Ambulation/Gait               General Gait Details: unable   Stairs             Wheelchair Mobility     Tilt Bed    Modified Rankin (Stroke Patients Only)       Balance Overall balance assessment: Needs assistance Sitting-balance support: Bilateral upper extremity supported, Feet supported Sitting balance-Leahy Scale: Poor Sitting balance - Comments: leans even in supported sitting                                    Communication Communication Communication: Impaired  Cognition Arousal: Alert Behavior During Therapy: Agitated, Flat affect   PT - Cognitive impairments:  Problem solving, Safety/Judgement, Attention Difficult to assess due to: Impaired communication                     PT - Cognition Comments: no attempt at speaking today but pt continuously trying to pull coretrack out, even with mitts on. Following commands: Impaired Following commands impaired:  (not following any commands today)    Cueing Cueing Techniques: Verbal cues, Gestural cues, Tactile cues, Visual cues  Exercises      General Comments General comments (skin integrity, edema, etc.): ROM to BUE with mittens on before being restrained. PROM to BLE's with bed  in chair position. Pt keeping L hip flexed. Pulling away and grimacing with all R sided touch or movement. Used washcloth to get sleep out of pt's eyes. Tolerated on L. Kept moving face away when attempted on R      Pertinent Vitals/Pain Pain Assessment Pain Assessment: Faces Faces Pain Scale: Hurts little more Pain Location: generalized, R side seems worse than L Pain Descriptors / Indicators: Discomfort Pain Intervention(s): Repositioned, Limited activity within patient's tolerance    Home Living                          Prior Function            PT Goals (current goals can now be found in the care plan section) Acute Rehab PT Goals Patient Stated Goal: None stated PT Goal Formulation: With patient/family Time For Goal Achievement: 12/15/23 Potential to Achieve Goals: Fair Progress towards PT goals: Not progressing toward goals - comment (cognition)    Frequency    Min 1X/week      PT Plan      Todd-evaluation              AM-PAC PT "6 Clicks" Mobility   Outcome Measure  Help needed turning from your back to your side while in a flat bed without using bedrails?: Total Help needed moving from lying on your back to sitting on the side of a flat bed without using bedrails?: Total Help needed moving to and from a bed to a chair (including a wheelchair)?: Total Help needed standing up from a chair using your arms (e.g., wheelchair or bedside chair)?: Total Help needed to walk in hospital room?: Total Help needed climbing 3-5 steps with a railing? : Total 6 Click Score: 6    End of Session   Activity Tolerance: Treatment limited secondary to agitation Patient left: with call bell/phone within reach;in bed;with bed alarm set;with restraints reapplied Nurse Communication: Mobility status PT Visit Diagnosis: Unsteadiness on feet (R26.81);History of falling (Z91.81);Difficulty in walking, not elsewhere classified (R26.2)     Time: 1040-1102 PT Time  Calculation (min) (ACUTE ONLY): 22 min  Charges:    $Therapeutic Exercise: 8-22 mins PT General Charges $$ ACUTE PT VISIT: 1 Visit                     William Todd, PT  Acute Rehab Services Secure chat preferred Office [phone]    William Todd 12/04/2023, 12:57 PM

## 2023-12-04 NOTE — Plan of Care (Signed)
   Problem: Nutritional: Goal: Maintenance of adequate nutrition will improve Outcome: Progressing   Problem: Skin Integrity: Goal: Risk for impaired skin integrity will decrease Outcome: Progressing

## 2023-12-04 NOTE — Progress Notes (Signed)
[facility] ADULT ICU REPLACEMENT PROTOCOL   The patient does apply for the [facility] Adult ICU Electrolyte Replacment Protocol based on the criteria listed below:   1.Exclusion criteria: TCTS, ECMO, Dialysis, and Myasthenia Gravis patients 2. Is GFR >/= 30 ml/min? Yes.    Patient's GFR today is 57 3. Is SCr </= 2? Yes.   Patient's SCr is 1.32 mg/dL 4. Did SCr increase >/= 0.5 in 24 hours? No. 5.Pt's weight >40kg  Yes.   6. Abnormal electrolyte(s):   K 3.6  7. Electrolytes replaced per protocol 8.  Call MD STAT for K+ </= 2.5, Phos </= 1, or Mag </= 1 Physician:  Benard Halsted Jataya Wann 12/04/2023 5:41 AM

## 2023-12-04 NOTE — Progress Notes (Signed)
SLP Cancellation Note  Patient Details Name: KAMSIYOCHUKWU SPICKLER MRN: [id number] DOB: May 16, 1951   Cancelled treatment:       Reason Eval/Treat Not Completed: Patient not medically ready per discussion with RN. SLP will continue following.    Gwynneth Aliment, M.A., CF-SLP Speech Language Pathology, Acute Rehabilitation Services  Secure Chat preferred [phone]  12/04/2023, 2:50 PM

## 2023-12-05 ENCOUNTER — Inpatient Hospital Stay (HOSPITAL_COMMUNITY): Payer: Medicare HMO

## 2023-12-05 DIAGNOSIS — I609 Nontraumatic subarachnoid hemorrhage, unspecified: Secondary | ICD-10-CM | POA: Diagnosis not present

## 2023-12-05 DIAGNOSIS — N1832 Chronic kidney disease, stage 3b: Secondary | ICD-10-CM | POA: Diagnosis not present

## 2023-12-05 DIAGNOSIS — I619 Nontraumatic intracerebral hemorrhage, unspecified: Secondary | ICD-10-CM | POA: Diagnosis not present

## 2023-12-05 DIAGNOSIS — I10 Essential (primary) hypertension: Secondary | ICD-10-CM | POA: Diagnosis not present

## 2023-12-05 DIAGNOSIS — E119 Type 2 diabetes mellitus without complications: Secondary | ICD-10-CM | POA: Diagnosis not present

## 2023-12-05 LAB — CBC
HCT: 25.9 % — ABNORMAL LOW (ref 39.0–52.0)
Hemoglobin: 8.2 g/dL — ABNORMAL LOW (ref 13.0–17.0)
MCH: 30.4 pg (ref 26.0–34.0)
MCHC: 31.7 g/dL (ref 30.0–36.0)
MCV: 95.9 fL (ref 80.0–100.0)
Platelets: 137 10*3/uL — ABNORMAL LOW (ref 150–400)
RBC: 2.7 MIL/uL — ABNORMAL LOW (ref 4.22–5.81)
RDW: 15.2 % (ref 11.5–15.5)
WBC: 9.1 10*3/uL (ref 4.0–10.5)
nRBC: 0 % (ref 0.0–0.2)

## 2023-12-05 LAB — URINALYSIS, COMPLETE (UACMP) WITH MICROSCOPIC
Bilirubin Urine: NEGATIVE
Glucose, UA: 50 mg/dL — AB
Hgb urine dipstick: NEGATIVE
Ketones, ur: NEGATIVE mg/dL
Leukocytes,Ua: NEGATIVE
Nitrite: NEGATIVE
Protein, ur: 30 mg/dL — AB
Specific Gravity, Urine: 1.02 (ref 1.005–1.030)
pH: 5 (ref 5.0–8.0)

## 2023-12-05 LAB — BASIC METABOLIC PANEL
Anion gap: 6 (ref 5–15)
BUN: 25 mg/dL — ABNORMAL HIGH (ref 8–23)
CO2: 27 mmol/L (ref 22–32)
Calcium: 8.7 mg/dL — ABNORMAL LOW (ref 8.9–10.3)
Chloride: 121 mmol/L — ABNORMAL HIGH (ref 98–111)
Creatinine, Ser: 1.37 mg/dL — ABNORMAL HIGH (ref 0.61–1.24)
GFR, Estimated: 54 mL/min — ABNORMAL LOW (ref >60)
Glucose, Bld: 284 mg/dL — ABNORMAL HIGH (ref 70–99)
Potassium: 4.2 mmol/L (ref 3.5–5.1)
Sodium: 154 mmol/L — ABNORMAL HIGH (ref 135–145)

## 2023-12-05 LAB — GLUCOSE, CAPILLARY
Glucose-Capillary: 128 mg/dL — ABNORMAL HIGH (ref 70–99)
Glucose-Capillary: 242 mg/dL — ABNORMAL HIGH (ref 70–99)
Glucose-Capillary: 242 mg/dL — ABNORMAL HIGH (ref 70–99)
Glucose-Capillary: 268 mg/dL — ABNORMAL HIGH (ref 70–99)
Glucose-Capillary: 72 mg/dL (ref 70–99)
Glucose-Capillary: 87 mg/dL (ref 70–99)
Glucose-Capillary: 90 mg/dL (ref 70–99)

## 2023-12-05 LAB — MAGNESIUM: Magnesium: 2.1 mg/dL (ref 1.7–2.4)

## 2023-12-05 LAB — SODIUM
Sodium: 157 mmol/L — ABNORMAL HIGH (ref 135–145)
Sodium: 157 mmol/L — ABNORMAL HIGH (ref 135–145)
Sodium: 157 mmol/L — ABNORMAL HIGH (ref 135–145)

## 2023-12-05 MED ORDER — SODIUM CHLORIDE 0.9 % IV SOLN: INTRAVENOUS | Status: DC

## 2023-12-05 MED ORDER — GADOBUTROL 1 MMOL/ML IV SOLN
6.0000 mL | Freq: Once | INTRAVENOUS | Status: AC | PRN
  Administered 2023-12-05: 6 mL via INTRAVENOUS

## 2023-12-05 MED ORDER — LORAZEPAM 2 MG/ML IJ SOLN
1.0000 mg | Freq: Once | INTRAMUSCULAR | Status: AC
  Administered 2023-12-05: 1 mg via INTRAVENOUS
  Filled 2023-12-05: qty 1

## 2023-12-05 NOTE — Progress Notes (Signed)
Nutrition Follow-up  DOCUMENTATION CODES:  Severe malnutrition in context of chronic illness  INTERVENTION:  Continue tube feeding via Cortrak: Osmolite 1.5 at 50 ml/h (1200 ml per day) Prosource TF20 60 ml daily   Provides 1880 kcal, 95 gm protein, free water daily    Continue Thiamine 100 mg daily for 3 more days MVI w/ minerals Continue bowel regimen  Monitor SLP notes for advancement in oral diet  NUTRITION DIAGNOSIS:  Severe Malnutrition related to chronic illness as evidenced by severe fat depletion, severe muscle depletion, percent weight loss. - remains applicable  GOAL:  Patient will meet greater than or equal to 90% of their needs   MONITOR:  Diet advancement, TF tolerance, Weight trends, Labs, Skin  REASON FOR ASSESSMENT:  Consult Enteral/tube feeding initiation and management, Assessment of nutrition requirement/status  ASSESSMENT:   Pt recently discharged on 2/6 after mechanical fall at home resulting in L femoral neck fracture requiring L hip arthroplasty. Discharged to [facility] where he fell again resulting in ICH. PMH: CAD, HLD, T2DM, HTN, anxiety, and prostate cancer (radiation 09/29/13-11/25/13). No formal dx of dementia, however with poor short term memory.  2/6 discharged from [facility] to [facility] 2/16 admitted to [facility] w/ ICH with bilateral [facility] 2/17 Cortrak placed/TF initiated/ pt pulled out Cortrak 2/19 Cortrak replaced/TF re-initiated/EEG 2/20 hypertonic saline stopped  PT note on 2/20 advising limited ability to perform therapy as patient continuously trying to pull out Cortrak when wrist restraints removed. CT yesterday showed hematoma stable and MLS. May transfer to the floor soon.   Tolerating TF regimen with no documented or reported N/V/C/D. Patient with no documented BM since admission.  Senna now being given and Miralax added 2/20.  Patient's wife at bedside today on follow up. Was able to collect more nutrition-related history. She  stated he was eating three meals at baseline and "could not get enough to eat" as well as one Ensure per day.   Admit Weight: 61.9kg Current Weight: 61kg  She reports no significant wt changes in last 6-8 months. Chart review shows otherwise, with 7.5% wt loss in two weeks between admissions.   Intake/Output Summary (Last 24 hours) at 12/05/2023 1437 Last data filed at 12/05/2023 1300 Gross per 24 hour  Intake 2395.8 ml  Output 1350 ml  Net 1045.8 ml    Patient off hypertonic saline. MD letting pt trend back down to normal range.   Meds: SSI, MVI, thiamine, pantoprazole, Miralax, senna   Labs: Na+ 157>154 (H) CBGs 284-362 over 24 hours   Diet Order:   Diet Order             Diet NPO time specified  Diet effective now            EDUCATION NEEDS:  Not appropriate for education at this time  Skin:  Skin Assessment: Skin Integrity Issues: Skin Integrity Issues:: Stage III, Stage II Stage II: L hip arthroplasty site Stage III: L posterior knee  Last BM:  PTA - pt unable to endorse  Height:  Ht Readings from Last 1 Encounters:  11/16/23 6' 0.01" (1.829 m)   Weight:  Wt Readings from Last 1 Encounters:  12/05/23 61 kg    Ideal Body Weight:  83.6 kg  BMI:  Body mass index is 18.23 kg/m.  Estimated Nutritional Needs:   Kcal:  1800-2000kcal  Protein:  90-100g  Fluid:  >1.8L/day  Myrtie Cruise MS, RD, LDN Registered Dietitian Clinical Nutrition RD Inpatient Contact Info in Amion

## 2023-12-05 NOTE — TOC CM/SW Note (Signed)
Transition of Care [facility]) - Inpatient Brief Assessment   Patient Details  Name: William Todd MRN: [id number] Date of Birth: 02-Jun-1951  Transition of Care [facility]) CM/SW Contact:    Mearl Latin, LCSW Phone Number: 12/05/2023, 5:15 PM   Clinical Narrative: CSW continuing to follow for medical progression.    Transition of Care Asessment: Insurance and Status: Insurance coverage has been reviewed Patient has primary care physician: Yes Home environment has been reviewed: From home Prior level of function:: Independent Prior/Current Home Services: No current home services Social Drivers of Health Review: SDOH reviewed no interventions necessary Readmission risk has been reviewed: Yes Transition of care needs: transition of care needs identified, TOC will continue to follow

## 2023-12-05 NOTE — Progress Notes (Signed)
STROKE TEAM PROGRESS NOTE   SUBJECTIVE (INTERVAL HISTORY) His RN is at the bedside. Pt no acute event overnight, neuro unchanged.  Occasionally opens eyes spontaneously, however resist eye forced opening.  Moaning but nonverbal to pain summation.  Withdraw to pain in extremity but less on the right.   OBJECTIVE Temp:  [98.9 F (37.2 C)-100.8 F (38.2 C)] 98.9 F (37.2 C) (02/21 1600) Pulse Rate:  [63-107] 107 (02/21 1800) Resp:  [12-26] 20 (02/21 1800) BP: (122-153)/(54-139) 122/67 (02/21 1800) SpO2:  [93 %-100 %] 99 % (02/21 1800) Weight:  [61 kg] 61 kg (02/21 0500)  Recent Labs  Lab 12/04/23 2354 12/05/23 0410 12/05/23 0729 12/05/23 1117 12/05/23 1548  GLUCAP 242* 90 242* 268* 72   Recent Labs  Lab 12/01/23 0513 12/01/23 1610 12/02/23 0624 12/02/23 1444 12/03/23 0456 12/03/23 0929 12/04/23 0431 12/04/23 0828 12/04/23 1325 12/04/23 1943 12/05/23 0129 12/05/23 0822 12/05/23 1555  NA 140  --  134*   < > 147*   < > 151*   < > 160* 156* 157* 154* 157*  K 4.3  --  3.9  --  3.7  --  3.6  --   --   --   --  4.2  --   CL 104  --  97*  --  112*  --  117*  --   --   --   --  121*  --   CO2 26  --  24  --  22  --  27  --   --   --   --  27  --   GLUCOSE 141*  --  215*  --  173*  --  362*  --   --   --   --  284*  --   BUN 18  --  19  --  16  --  18  --   --   --   --  25*  --   CREATININE 1.29*  --  1.17  --  1.49*  --  1.32*  --   --   --   --  1.37*  --   CALCIUM 9.2  --  8.6*  --  9.2  --  8.7*  --   --   --   --  8.7*  --   MG  --  1.7 1.7  --  1.8  --   --   --   --   --   --  2.1  --   PHOS  --  3.7 3.4  --  3.0  --   --   --   --   --   --   --   --    < > = values in this interval not displayed.   Recent Labs  Lab 11/29/23 2342 11/30/23 0400  AST 43* 43*  ALT 51* 45*  ALKPHOS 89 90  BILITOT 0.6 0.6  PROT 6.6 6.1*  ALBUMIN 3.1* 2.9*   Recent Labs  Lab 11/29/23 2342 11/30/23 0400 12/01/23 0513 12/02/23 0624 12/03/23 0456 12/04/23 0431 12/05/23 0822   WBC 10.5   < > 8.6 8.9 7.7 6.8 9.1  NEUTROABS 8.9*  --   --   --   --   --   --   HGB 9.6*   < > 8.0* 8.0* 8.6* 7.8* 8.2*  HCT 30.0*   < > 25.0* 23.9* 26.1* 24.2* 25.9*  MCV 94.6   < > 93.3 90.9  92.2 93.4 95.9  PLT 212   < > 154 164 156 145* 137*   < > = values in this interval not displayed.   No results for input(s): "CKTOTAL", "CKMB", "CKMBINDEX", "TROPONINI" in the last 168 hours. No results for input(s): "LABPROT", "INR" in the last 72 hours.       Component Value Date/Time   CHOL 111 12/02/2023 0624   TRIG 63 12/02/2023 0624   HDL 41 12/02/2023 0624   CHOLHDL 2.7 12/02/2023 0624   VLDL 13 12/02/2023 0624   LDLCALC 57 12/02/2023 0624   Lab Results  Component Value Date   HGBA1C 6.1 (H) 12/02/2023     I have personally reviewed the radiological images below and agree with the radiology interpretations.  CT HEAD WO CONTRAST ( ) Result Date: 12/04/2023 CLINICAL DATA:  73 year old male with multifocal intracranial hemorrhage after a fall. Subsequent encounter. EXAM: CT HEAD WITHOUT CONTRAST TECHNIQUE: Contiguous axial images were obtained from the base of the skull through the vertex without intravenous contrast. RADIATION DOSE REDUCTION: This exam was performed according to the departmental dose-optimization program which includes automated exposure control, adjustment of the mA and/or kV according to patient size and/or use of iterative reconstruction technique. COMPARISON:  12/02/2023 and earlier. FINDINGS: Brain: Left anterior frontal lobe intra-axial hemorrhage remains hyperdense and amorphous. Due to scan angulation the most direct comparison now is on sagittal images (series 6, image 39 today versus the same series and image 2 days ago), where hemorrhage size and configuration appear unchanged. Regional edema and mass effect also unchanged. Scattered bilateral subarachnoid blood has not significantly changed. Small volume layering intraventricular hemorrhage has not  significantly changed. Stable ventricle size and configuration, no ventriculomegaly. Stable mild rightward midline shift limited to the anterior septum pellucidum. Basilar cisterns are stable. Stable gray-white matter differentiation throughout the brain. Left frontal lobe edema surrounding the hematoma and chronic right PCA territory infarct with encephalomalacia. Vascular: No suspicious intracranial vascular hyperdensity. Skull: Intact, stable. Incidental large maxillary torus exostoses incidentally noted. Sinuses/Orbits: Left nasoenteric tube now in place. Visualized paranasal sinuses and mastoids are stable and well aerated. Other: Stable orbit and scalp soft tissues. IMPRESSION: 1. Continued stability of large anterior left frontal lobar hemorrhage, associated edema. Relatively mild intracranial mass effect, scattered small volume bilateral SAH, and IVH remain stable. 2. No new intracranial abnormality. Chronic right PCA territory infarct. Electronically Signed   By: Odessa Fleming M.D.   On: 12/04/2023 06:37   EEG adult Result Date: 12/03/2023 Charlsie Quest, MD     12/03/2023  4:09 PM Patient Name: William Todd MRN: [id number] Epilepsy Attending: Charlsie Quest Referring Physician/Provider: Elmer Picker, NP Date: 12/03/2023 Duration: 23.45 mins Patient history: 73yo M with left frontal ICH. EEG to evaluate for seizure Level of alertness: Awake, asleep AEDs during EEG study: LEV Technical aspects: This EEG study was done with scalp electrodes positioned according to the 10-20 International system of electrode placement. Electrical activity was reviewed with band pass filter of 1-70Hz , sensitivity of 7 uV/mm, display speed of 7mm/sec with a 60Hz  notched filter applied as appropriate. EEG data were recorded continuously and digitally stored.  Video monitoring was available and reviewed as appropriate. Description: No clear posterior dominant rhythm was seen. Sleep was characterized by sleep spindles (12 to  14 Hz), maximal frontocentral region. EEG showed continuous generalized and maximal left frontal 3 to 6 Hz theta-delta slowing. Sharp waves were noted in left frontal region,at times periodic at 1hz . Hyperventilation and photic stimulation were  not performed.   ABNORMALITY - Sharp wave, left frontal region - Continuous slow, generalized and maximal left frontal region IMPRESSION: This study showed evidence of epileptogenicity and cortical dysfunction arising from left frontal region likely secondary to underlying ICH. Additionally there is moderate diffuse encephalopathy. No seizures were seen throughout the recording. If suspicion for ictal activity remains a concern, a prolonged study can be considered. Charlsie Quest   DG Abd Portable 1V Result Date: 12/03/2023 CLINICAL DATA:  73 year old male feeding tube placement. EXAM: PORTABLE ABDOMEN - 1 VIEW COMPARISON:  12/01/2023 and earlier. FINDINGS: Portable AP semi upright view at 1038 hours. Enteric feeding tube is in place within the proximal stomach, looped at the level of the gastric fundus. Negative lung bases. Nonobstructed bowel gas pattern. No acute osseous abnormality identified. IMPRESSION: Enteric feeding tube placed into the proximal stomach. Electronically Signed   By: Odessa Fleming M.D.   On: 12/03/2023 11:19   CT HEAD WO CONTRAST ( ) Result Date: 12/02/2023 CLINICAL DATA:  73 year old male with multifocal intracranial hemorrhage status post fall. Hemorrhage progression after presentation. EXAM: CT HEAD WITHOUT CONTRAST TECHNIQUE: Contiguous axial images were obtained from the base of the skull through the vertex without intravenous contrast. RADIATION DOSE REDUCTION: This exam was performed according to the departmental dose-optimization program which includes automated exposure control, adjustment of the mA and/or kV according to patient size and/or use of iterative reconstruction technique. COMPARISON:  CTA head and neck this morning. Head CT  yesterday and earlier. FINDINGS: Brain: Large volume mixed density but mostly hyperdense intra-axial hemorrhage in the anterior left frontal lobe encompasses 78 by 44 x 63 mm (AP by transverse by CC) versus 79 x 45 x 61 mm yesterday when measured with the same technique. Volume estimated at 108 mL, versus 105 mL on 11/30/2023, and hemorrhage size and configuration appears visually stable. Superimposed scattered bilateral subarachnoid blood and a moderate volume of mostly now layering lateral intraventricular blood also appears stable. Rightward midline shift at the anterior septum pellucidum of 7-8 mm has increased from 5-6 mm on 11/30/2023. Ventricle size and configuration is stable. Stable basilar cistern patency. Superimposed chronic right PCA territory encephalomalacia and confluent bilateral cerebral white matter hypodensity. Vascular: Calcified atherosclerosis at the skull base. No suspicious intracranial vascular hyperdensity. Skull: Stable, intact. Sinuses/Orbits: Visualized paranasal sinuses and mastoids are stable and well aerated. Other: Stable, negative orbit and scalp soft tissues. IMPRESSION: 1. Large volume anterior frontal lobe intra-axial hemorrhage does not appear significantly changed in size or configuration since 11/30/2023. Using largest orthogonal dimensions, estimated blood volume is 108 mL now vs. 105 mL then. 2. Intracranial mass effect with mildly increased rightward midline shift since 11/30/2023, now 7-8 mm. Basilar cisterns remain patent. 3. Stable bilateral SAH and IVH. Stable ventricle size and configuration. 4. Chronic right PCA territory infarct. Nonspecific white matter changes. Electronically Signed   By: Odessa Fleming M.D.   On: 12/02/2023 11:47   CT ANGIO HEAD NECK W WO CM Result Date: 12/02/2023 CLINICAL DATA:  73 year old male with multifocal intracranial hemorrhage status post fall. EXAM: CT ANGIOGRAPHY HEAD AND NECK TECHNIQUE: Multidetector CT imaging of the head and neck was  performed using the standard protocol during bolus administration of intravenous contrast. Multiplanar CT image reconstructions and MIPs were obtained to evaluate the vascular anatomy. Carotid stenosis measurements (when applicable) are obtained utilizing NASCET criteria, using the distal internal carotid diameter as the denominator. RADIATION DOSE REDUCTION: This exam was performed according to the departmental dose-optimization program which includes  automated exposure control, adjustment of the mA and/or kV according to patient size and/or use of iterative reconstruction technique. CONTRAST:  75mL OMNIPAQUE IOHEXOL 350 MG/ML SOLN COMPARISON:  Head CTs 12/01/2023 and earlier. Brain MRI 11/15/2023. FINDINGS: CTA NECK Skeleton: Cervical spine degeneration. 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 No acute osseous abnormality identified. Upper chest: Upper lungs are clear. Negative visible superior mediastinum. Other neck: Neck soft tissue spaces appear within normal limits. Aortic arch: Calcified aortic atherosclerosis.  3 vessel arch. Right carotid system: No significant brachiocephalic artery or or right CCA origin atherosclerosis. Soft and calcified plaque of the right CCA before the bifurcation without stenosis. Bulky calcified atherosclerosis of the proximal right ICA beginning at the origin but most pronounced at the bulb. Subsequent distal bulb stenosis is numerically estimated at 60 % with respect to the distal vessel (series 7, image 98 and series 9, image 155). Right ICA remains patent to the skull base. Left carotid system: Minimal left CCA atherosclerosis before the bifurcation. Soft and calcified plaque at the left ICA origin and more pronounced at the bulb. Up to 50 % stenosis with respect to the distal vessel. Additional calcified plaque of the left ICA just below the skull base without stenosis. Vertebral arteries: Mild proximal right subclavian  atherosclerosis without stenosis. Calcified plaque at the right vertebral artery origin with only mild stenosis on series 8, image 145. Right vertebral is patent to the skull base with no significant plaque or stenosis. Proximal left subclavian artery soft and calcified plaque without stenosis. Mostly soft plaque at the left vertebral artery origin with up to moderate origin stenosis on series 8, image 135. Left vertebral remains patent and is fairly codominant in the neck. Mild left V3 segment calcified plaque with no additional significant stenosis to the skull base. CTA HEAD Posterior circulation: Bilateral V4 segment atherosclerosis. Bulky and severe right V4 calcified plaque and stenosis on series 5, image 145. The right vertebrobasilar junction remains patent. Contralateral moderate left V4 segment stenosis proximal to the left PICA origin which remains normal. Right PICA origin is patent. Patent left vertebrobasilar junction. Patent basilar artery without stenosis. Fetal type bilateral PCA origins. Patent SCA origins. Bilateral PCA branches are within normal limits. Anterior circulation: Both ICA siphons are patent. Left siphon cavernous segment calcified plaque is moderate with mild to moderate stenosis at the anterior genu. Normal left posterior communicating artery origin. Right ICA cavernous through supraclinoid segment calcified plaque is moderate with mild to moderate supraclinoid stenosis. Normal right posterior communicating artery origin. Patent carotid termini. Patent MCA origins. Dominant right and diminutive or absent left ACA A1 segments. Anterior communicating artery and bilateral ACA branches are within normal limits. Left MCA M1 segment and bifurcation are patent without stenosis. There is mild mass effect on the left MCA branches which otherwise appear within normal limits. Right MCA M1 segment and trifurcation are patent without stenosis. Right MCA branches are within normal limits. No CTA  spot sign or abnormal vascularity associated with the left anterior superior frontal lobe intra-axial hemorrhage. Other findings: Grossly stable intracranial hemorrhage, mass effect from the head CT yesterday. Venous sinuses: Early contrast timing. Superior sagittal sinus, torcula, straight sinus, vein of Galen and internal cerebral veins, dominant appearing right transverse and sigmoid venous sinuses appear to be enhancing and patent. Anatomic variants: Fetal type bilateral PCA origins. Dominant right ACA A1. Review of the MIP images confirms the above findings IMPRESSION: 1. Negative for CTA spot sign in association with acute intracranial  hemorrhage. Mild mass effect on left MCA vasculature. Negative for intracranial aneurysm, vascular malformation, or large vessel occlusion. 2. Atherosclerosis in the head and neck notable for: - 60% stenosis of the Right ICA bulb. - Moderate To Severe bilateral distal vertebral artery stenosis. - Moderate Left Vertebral Artery origin stenosis. - mild to moderate bilateral ICA siphon stenosis. 3.  Aortic Atherosclerosis (ICD10-I70.0). Electronically Signed   By: Odessa Fleming M.D.   On: 12/02/2023 06:41   DG Abd Portable 1V Result Date: 12/01/2023 CLINICAL DATA:  NG placement. EXAM: PORTABLE ABDOMEN - 1 VIEW COMPARISON:  CT abdomen pelvis dated 07/01/2013. FINDINGS: Feeding tube with tip in the left upper abdomen likely in the region of the gastric fundus. Large amount of stool throughout the colon. IMPRESSION: Feeding tube with tip in the region of the gastric fundus. Electronically Signed   By: Elgie Collard M.D.   On: 12/01/2023 18:55   CT HEAD WO CONTRAST ( ) Result Date: 12/01/2023 CLINICAL DATA:  Follow-up of intracranial hemorrhage. EXAM: CT HEAD WITHOUT CONTRAST TECHNIQUE: Contiguous axial images were obtained from the base of the skull through the vertex without intravenous contrast. RADIATION DOSE REDUCTION: This exam was performed according to the departmental  dose-optimization program which includes automated exposure control, adjustment of the mA and/or kV according to patient size and/or use of iterative reconstruction technique. COMPARISON:  None Available. FINDINGS: Brain: Redemonstrated intraparenchymal hematoma in the anterior left frontal lobe which measures 7.9 x 4.9 x 3.6 cm, previously measuring 7.8 x 5.2 x 3.4 cm when remeasured in a similar manner. Similar surrounding edema and associated mass effect. Approximately 5 mm rightward midline shift anteriorly similar to prior. Extension of hemorrhage into the adjacent left frontal horn with similar appearance of blood products in the left lateral ventricle. Increased blood products within the atrium and occipital horn of the right lateral ventricle. Slightly decreased blood products in the third ventricle. Small focus of subdural hemorrhage along the anterior falx. Scattered areas of subarachnoid hemorrhage along the right frontal lobe and right sylvian fissure again noted. Remote infarct in the right occipital lobe. Vascular: No hyperdense vessel or unexpected calcification. Skull: Normal. Negative for fracture or focal lesion. Sinuses/Orbits: No acute finding. Other: Mastoid air cells are clear. IMPRESSION: Similar appearance of intraparenchymal hematoma in the left frontal lobe with associated mass effect and 5 mm rightward midline shift. Additional small multi compartment intracranial hemorrhage as above. Slightly increased blood products within the atrium and occipital horn of the right lateral ventricle. Electronically Signed   By: Emily Filbert M.D.   On: 12/01/2023 13:34   ECHOCARDIOGRAM COMPLETE Result Date: 11/30/2023    ECHOCARDIOGRAM REPORT   Patient Name:   CARTIER WASHKO Date of Exam: 11/30/2023 Medical Rec #:  [id number]        Height:       72.0 in Accession #:    [id number]       Weight:       147.0 lb Date of Birth:  01-Apr-1951         BSA:          1.869 m Patient Age:    72 years         BP:            129/68 mmHg Patient Gender: M                HR:           77 bpm. Exam Location:  Inpatient Procedure: 2D  Echo (Both Spectral and Color Flow Doppler were utilized during            procedure). Indications:    intracerebral hemorrhage  History:        Patient has prior history of Echocardiogram examinations, most                 recent 01/02/2013. Risk Factors:Hypertension, Diabetes and                 Dyslipidemia.  Sonographer:    Delcie Roch RDCS Referring Phys: [phone] WHITNEY D HARRIS IMPRESSIONS  1. Left ventricular ejection fraction, by estimation, is 60 to 65%. The left ventricle has normal function. The left ventricle has no regional wall motion abnormalities. Left ventricular diastolic parameters were normal.  2. Right ventricular systolic function is normal. The right ventricular size is normal. There is mildly elevated pulmonary artery systolic pressure.  3. The mitral valve is degenerative. Mild mitral valve regurgitation. No evidence of mitral stenosis.  4. The aortic valve is tricuspid. There is mild calcification of the aortic valve. Aortic valve regurgitation is not visualized. Aortic valve sclerosis is present, with no evidence of aortic valve stenosis.  5. The inferior vena cava is normal in size with greater than 50% respiratory variability, suggesting right atrial pressure of 3 mmHg. FINDINGS  Left Ventricle: Left ventricular ejection fraction, by estimation, is 60 to 65%. The left ventricle has normal function. The left ventricle has no regional wall motion abnormalities. Strain imaging was not performed. The left ventricular internal cavity  size was normal in size. There is no left ventricular hypertrophy. Left ventricular diastolic parameters were normal. Right Ventricle: The right ventricular size is normal. No increase in right ventricular wall thickness. Right ventricular systolic function is normal. There is mildly elevated pulmonary artery systolic pressure. The tricuspid  regurgitant velocity is 2.89  m/s, and with an assumed right atrial pressure of 3 mmHg, the estimated right ventricular systolic pressure is 36.4 mmHg. Left Atrium: Left atrial size was normal in size. Right Atrium: Right atrial size was normal in size. Pericardium: There is no evidence of pericardial effusion. Mitral Valve: The mitral valve is degenerative in appearance. There is mild calcification of the anterior mitral valve leaflet(s). Mild mitral valve regurgitation. No evidence of mitral valve stenosis. Tricuspid Valve: The tricuspid valve is grossly normal. Tricuspid valve regurgitation is mild . No evidence of tricuspid stenosis. Aortic Valve: The aortic valve is tricuspid. There is mild calcification of the aortic valve. Aortic valve regurgitation is not visualized. Aortic valve sclerosis is present, with no evidence of aortic valve stenosis. Pulmonic Valve: The pulmonic valve was grossly normal. Pulmonic valve regurgitation is not visualized. No evidence of pulmonic stenosis. Aorta: The aortic root and ascending aorta are structurally normal, with no evidence of dilitation. Venous: The inferior vena cava is normal in size with greater than 50% respiratory variability, suggesting right atrial pressure of 3 mmHg. IAS/Shunts: The atrial septum is grossly normal. Additional Comments: 3D imaging was not performed.  LEFT VENTRICLE PLAX 2D LVIDd:         3.20 cm   Diastology LVIDs:         1.70 cm   LV e' medial:    8.16 cm/s LV PW:         1.20 cm   LV E/e' medial:  10.7 LV IVS:        1.00 cm   LV e' lateral:   11.20 cm/s LVOT diam:  2.40 cm   LV E/e' lateral: 7.8 LV SV:         115 LV SV Index:   61 LVOT Area:     4.52 cm  RIGHT VENTRICLE             IVC RV Basal diam:  2.80 cm     IVC diam: 1.70 cm RV S prime:     12.90 cm/s TAPSE (M-mode): 2.2 cm LEFT ATRIUM             Index        RIGHT ATRIUM           Index LA diam:        3.30 cm 1.77 cm/m   RA Area:     13.00 cm LA Vol (A2C):   45.6 ml 24.39  ml/m  RA Volume:   29.50 ml  15.78 ml/m LA Vol (A4C):   39.1 ml 20.92 ml/m LA Biplane Vol: 43.8 ml 23.43 ml/m  AORTIC VALVE LVOT Vmax:   125.00 cm/s LVOT Vmean:  83.200 cm/s LVOT VTI:    0.254 m  AORTA Ao Root diam: 3.30 cm Ao Asc diam:  3.20 cm MITRAL VALVE               TRICUSPID VALVE MV Area (PHT): 3.37 cm    TR Peak grad:   33.4 mmHg MV Decel Time: 225 msec    TR Vmax:        289.00 cm/s MR Peak grad: 113.2 mmHg MR Mean grad: 73.0 mmHg    SHUNTS MR Vmax:      532.00 cm/s  Systemic VTI:  0.25 m MR Vmean:     403.0 cm/s   Systemic Diam: 2.40 cm MV E velocity: 87.30 cm/s MV A velocity: 70.40 cm/s MV E/A ratio:  1.24 Lennie Odor MD Electronically signed by Lennie Odor MD Signature Date/Time: 11/30/2023/8:57:24 PM    Final    CT Head Wo Contrast Result Date: 11/30/2023 CLINICAL DATA:  Follow-up head trauma EXAM: CT HEAD WITHOUT CONTRAST TECHNIQUE: Contiguous axial images were obtained from the base of the skull through the vertex without intravenous contrast. RADIATION DOSE REDUCTION: This exam was performed according to the departmental dose-optimization program which includes automated exposure control, adjustment of the mA and/or kV according to patient size and/or use of iterative reconstruction technique. COMPARISON:  Head CT from the same day at 2 a.m. FINDINGS: Brain: Growing hematoma in the anterior left frontal lobe, hemorrhagic area measuring 7.3 x 5 x 3.4 cm. The hemorrhage has now decompressed into the left lateral ventricle with tracking to the level of the fourth ventricle. No hydrocephalus. Patchy subarachnoid hemorrhage along the frontal lobes and sylvian fissures which appears similar. The hemorrhage and edema causes progressive swelling with anterior midline shift measuring 5 mm. Chronic right occipital infarct. Low-density in the cerebral white matter from chronic small vessel ischemia. Vascular: No hyperdense vessel or unexpected calcification. Skull: No acute finding Sinuses/Orbits:  No evidence of injury Critical Value/emergent results were called by telephone at the time of interpretation on 11/30/2023 at 8:45 am to provider [place], who verbally acknowledged these results. IMPRESSION: Progressive left frontal hematoma now measuring 60 cc with new intraventricular extension reaching the fourth ventricle. Patchy bilateral subarachnoid hemorrhage appears similar to prior. The growing hematoma and edema causes anterior midline shift by 5 mm. No hydrocephalus. Electronically Signed   By: Tiburcio Pea M.D.   On: 11/30/2023 08:46   CT Head Wo Contrast Result Date:  11/30/2023 CLINICAL DATA:  Head trauma, minor (Age >= 65y); Neck trauma (Age >= 65y) EXAM: CT HEAD WITHOUT CONTRAST CT CERVICAL SPINE WITHOUT CONTRAST TECHNIQUE: Multidetector CT imaging of the head and cervical spine was performed following the standard protocol without intravenous contrast. Multiplanar CT image reconstructions of the cervical spine were also generated. RADIATION DOSE REDUCTION: This exam was performed according to the departmental dose-optimization program which includes automated exposure control, adjustment of the mA and/or kV according to patient size and/or use of iterative reconstruction technique. COMPARISON:  MRI head November 15, 2023. FINDINGS: CT HEAD FINDINGS Brain: Acute intraparenchymal hemorrhage in the left frontal lobe measuring 2.6 cm with overlying extra-axial extension. Small volume of multifocal subarachnoid hemorrhage bilaterally along the right frontal convexity, left frontal convexity and layering within the left sylvian fissure. No midline shift. No hydrocephalus. Remote right occipital infarct. Vascular: Calcific atherosclerosis.  No dense vessel. Skull: No acute fracture. Sinuses/Orbits: Clear sinuses.  No acute orbital findings. Other: No mastoid effusions. CT CERVICAL SPINE FINDINGS Alignment: No substantial sagittal subluxation. Skull base and vertebrae: No acute fracture. Soft tissues and  spinal canal: No prevertebral fluid or swelling. No visible canal hematoma. Disc levels:  Moderate multilevel degenerative change. Upper chest: Visualized lung apices are clear. IMPRESSION: 1. Acute intraparenchymal hemorrhage in left frontal lobe with multifocal bilateral small volume subarachnoid hemorrhage. No midline shift. 2. No acute fracture or traumatic malalignment in the cervical spine. Findings discussed with Dr. Clayborne Dana via telephone at 2:19 a.m. Electronically Signed   By: Feliberto Harts M.D.   On: 11/30/2023 02:21   CT Cervical Spine Wo Contrast Result Date: 11/30/2023 CLINICAL DATA:  Head trauma, minor (Age >= 65y); Neck trauma (Age >= 65y) EXAM: CT HEAD WITHOUT CONTRAST CT CERVICAL SPINE WITHOUT CONTRAST TECHNIQUE: Multidetector CT imaging of the head and cervical spine was performed following the standard protocol without intravenous contrast. Multiplanar CT image reconstructions of the cervical spine were also generated. RADIATION DOSE REDUCTION: This exam was performed according to the departmental dose-optimization program which includes automated exposure control, adjustment of the mA and/or kV according to patient size and/or use of iterative reconstruction technique. COMPARISON:  MRI head November 15, 2023. FINDINGS: CT HEAD FINDINGS Brain: Acute intraparenchymal hemorrhage in the left frontal lobe measuring 2.6 cm with overlying extra-axial extension. Small volume of multifocal subarachnoid hemorrhage bilaterally along the right frontal convexity, left frontal convexity and layering within the left sylvian fissure. No midline shift. No hydrocephalus. Remote right occipital infarct. Vascular: Calcific atherosclerosis.  No dense vessel. Skull: No acute fracture. Sinuses/Orbits: Clear sinuses.  No acute orbital findings. Other: No mastoid effusions. CT CERVICAL SPINE FINDINGS Alignment: No substantial sagittal subluxation. Skull base and vertebrae: No acute fracture. Soft tissues and spinal  canal: No prevertebral fluid or swelling. No visible canal hematoma. Disc levels:  Moderate multilevel degenerative change. Upper chest: Visualized lung apices are clear. IMPRESSION: 1. Acute intraparenchymal hemorrhage in left frontal lobe with multifocal bilateral small volume subarachnoid hemorrhage. No midline shift. 2. No acute fracture or traumatic malalignment in the cervical spine. Findings discussed with Dr. Clayborne Dana via telephone at 2:19 a.m. Electronically Signed   By: Feliberto Harts M.D.   On: 11/30/2023 02:21   DG Chest Portable 1 View Result Date: 11/30/2023 CLINICAL DATA:  Fall EXAM: PORTABLE CHEST 1 VIEW COMPARISON:  03/05/2012 FINDINGS: There is asymmetric airspace infiltrate within the right mid lung zone, possibly infectious or posttraumatic given the history of recent trauma. No pneumothorax or pleural effusion. Cardiac size within normal  limits. Pulmonary vascularity is normal. No acute bone abnormality. IMPRESSION: 1. Asymmetric airspace infiltrate within the right mid lung zone, possibly infectious or posttraumatic given the history of recent trauma. Electronically Signed   By: Helyn Numbers M.D.   On: 11/30/2023 01:18   DG Pelvis Portable Result Date: 11/30/2023 CLINICAL DATA:  Fall EXAM: PORTABLE PELVIS 1-2 VIEWS COMPARISON:  None Available. FINDINGS: Left total hip arthroplasty has been performed. No acute fracture or dislocation. Mild right hip degenerative arthritis. Vascular calcifications noted. Soft tissues are otherwise unremarkable. IMPRESSION: 1. Left total hip arthroplasty. No acute fracture or dislocation. Electronically Signed   By: Helyn Numbers M.D.   On: 11/30/2023 01:17   DG HIP UNILAT W OR W/O PELVIS 2-3 VIEWS LEFT Result Date: 11/16/2023 CLINICAL DATA:  [id number] History of left hip hemiarthroplasty [id number] EXAM: DG HIP (WITH OR WITHOUT PELVIS) 2-3V LEFT COMPARISON:  11/15/2023 FINDINGS: Interval left hip arthroplasty, components projecting in expected location. No  fracture or dislocation. Surgical clips project over the pubic bones. Patchy iliofemoral arterial calcifications. IMPRESSION: Left hip arthroplasty, without apparent complication. Electronically Signed   By: Corlis Leak M.D.   On: 11/16/2023 16:11   MR BRAIN WO CONTRAST Result Date: 11/15/2023 CLINICAL DATA:  Abnl CT Head, evaluate for pontine infarct EXAM: MRI HEAD WITHOUT CONTRAST TECHNIQUE: Multiplanar, multiecho pulse sequences of the brain and surrounding structures were obtained without intravenous contrast. COMPARISON:  CT head from today. FINDINGS: Brain: No acute infarction, hemorrhage, hydrocephalus, extra-axial collection or mass lesion. Remote right PCA territory infarct. Moderate T2/FLAIR hyperintensities in the white matter, compatible with chronic microvascular ischemic disease. Vascular: Major arterial flow voids are maintained at the skull base. Skull and upper cervical spine: Normal marrow signal. Sinuses/Orbits: Negative. Other: No sizable mastoid effusions. IMPRESSION: 1. No evidence of acute intracranial abnormality. 2. Remote right PCA territory infarct. Electronically Signed   By: Feliberto Harts M.D.   On: 11/15/2023 22:38   CT Head Wo Contrast Result Date: 11/15/2023 CLINICAL DATA:  Altered mental status, fall EXAM: CT HEAD WITHOUT CONTRAST TECHNIQUE: Contiguous axial images were obtained from the base of the skull through the vertex without intravenous contrast. RADIATION DOSE REDUCTION: This exam was performed according to the departmental dose-optimization program which includes automated exposure control, adjustment of the mA and/or kV according to patient size and/or use of iterative reconstruction technique. COMPARISON:  None Available. FINDINGS: Brain: No acute territorial infarction, hemorrhage or intracranial mass. 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 Atrophy and advanced chronic small vessel ischemic changes of the white matter.  Hypodensity at the pons, series 2, image 11 and sagittal series 6 image 26. Ventricles are nonenlarged Vascular: No hyperdense vessels. Carotid and vertebral calcifications. Skull: Normal. Negative for fracture or focal lesion. Sinuses/Orbits: No acute finding. Other: None IMPRESSION: 1. Negative for acute intracranial hemorrhage or mass. 2. Hypodensity at the pons, though some of this may be related to artifact, the finding is rounded with some sparing of peripheral tissue and acute or subacute pontine infarct cannot be excluded. MRI is recommended for further evaluation. 3. Atrophy and chronic small vessel ischemic changes of the white matter Electronically Signed   By: Jasmine Pang M.D.   On: 11/15/2023 18:51   DG Ankle Complete Left Result Date: 11/15/2023 CLINICAL DATA:  Fall with ankle pain EXAM: LEFT ANKLE COMPLETE - 3+ VIEW COMPARISON:  01/30/2012 FINDINGS: Lateral plate and multiple fixating screws with additional medial malleolar fixating screws. Chronic postsurgical, posttraumatic and post infectious changes involving  the distal tibia, distal fibula and tibiotalar joint. Fused appearance across the tibiotalar joint and probable subtalar fusion as well. No definite acute fracture is seen. IMPRESSION: Chronic postsurgical, posttraumatic and post infectious changes involving the distal tibia, distal fibula and tibiotalar joint. No definite acute osseous abnormality. Electronically Signed   By: Jasmine Pang M.D.   On: 11/15/2023 18:09   DG Foot Complete Left Result Date: 11/15/2023 CLINICAL DATA:  Larey Seat 2 weeks ago with left foot pain. EXAM: LEFT FOOT - COMPLETE 3+ VIEW COMPARISON:  Left ankle 01/30/2012 FINDINGS: Diffuse bone demineralization. Degenerative changes in the interphalangeal joints, metatarsal-phalangeal, tarsometatarsal, and interphalangeal joints. Postoperative fusion of the ankle joint with plate and screw fixation. No evidence of acute fracture or dislocation. Soft tissues are  unremarkable. IMPRESSION: 1. No acute bony abnormalities. 2. Degenerative changes in the left foot. 3. Postoperative changes with surgical fusion of the left ankle including plate and screw fixation Electronically Signed   By: Burman Nieves M.D.   On: 11/15/2023 18:03   DG Hip Unilat W or Wo Pelvis 2-3 Views Left Result Date: 11/15/2023 CLINICAL DATA:  Fall 2 weeks ago. Dizziness with fall forward. Left foot pain. EXAM: DG HIP (WITH OR WITHOUT PELVIS) 2-3V LEFT COMPARISON:  None Available. FINDINGS: Transverse subcapital fracture of the left femoral neck with varus angulation of the fracture fragments. No inter trochanteric involvement is identified. No dislocation at the hip joint. Pelvis appears intact. SI joints and symphysis pubis are not displaced. No destructive or expansile bone lesions. Degenerative changes in the lower lumbar spine and both hips. Vascular calcifications. IMPRESSION: Acute transverse fracture of the left femoral neck with varus angulation of the fracture fragments. Degenerative changes. Electronically Signed   By: Burman Nieves M.D.   On: 11/15/2023 18:02     PHYSICAL EXAM  Temp:  [98.9 F (37.2 C)-100.8 F (38.2 C)] 98.9 F (37.2 C) (02/21 1600) Pulse Rate:  [63-107] 107 (02/21 1800) Resp:  [12-26] 20 (02/21 1800) BP: (122-153)/(54-139) 122/67 (02/21 1800) SpO2:  [93 %-100 %] 99 % (02/21 1800) Weight:  [61 kg] 61 kg (02/21 0500)  General - well nourished, well developed, in no apparent distress.     Ophthalmologic - fundi not visualized due to noncooperation.     Cardiovascular - regular rhythm and rate   Neuro - eyes not open on voice, but resisted forced opening, nonverbal and not following commands. Not able to check eye gaze or pupil today due to resisting forced eye opening. Mild right facial droop. Tongue protrusion or mouth open not cooperative. Withdraw to pain on all extremities but more so on the left. Sensation, coordination and gait not  tested.   ASSESSMENT/PLAN William Todd is a 73 y.o. male with PMH of DM, HTN, HLD, prostate cancer, left foot fracture with prosthesis, dementia and recent fall with acute transverse fracture of the left femoral neck s/p hemiarthroplasty on 2/2. Post op delirium/confusion but MRI at that time no acute finding, no stroke or hemorrhage. No sign of CAA. He was put on ASA 81 bid for 4 weeks for DVT prophylaxis. He was discharged to SNF on 11/20/23. Readmitted on 2/15 with fall at SNF and left eyebrow laceration. CT head showed left frontal small ICH with bilateral small SAH, concerning for traumatic ICH with contusion. However repeat CT 2/16 showed significant enlarged hematoma on the left frontal area with multi-lobe configuration and IVH and SAH but no hydro with 5mm MLS.     ICH:  left  frontal large ICH, etiology unclear, traumatic ICH vs. Spontaneous ICH. Less likely for hemorrhagic infarct CT head 2/16 left frontal small ICH with bilateral small SAH, concerning for traumatic ICH with contusion.  Repeat CT 2/16 showed significant enlarged hematoma on the left frontal area with multi-lobe configuration and IVH and SAH but no hydro with 5mm MLS.    Repeat CT 2/17 Similar appearance of intraparenchymal hematoma in the left frontal lobe with associated mass effect and 5 mm rightward midline shift. CTA head and neck - no spot sign. 60% R ICA bulb, b/l distal VA mod to severe stenosis,  mod left VA origin stenosis and mild to mod b/l ICA siphon CT repeat 2/18 large left frontal ICH 108cc vs. 2/17 105cc but with MLS of 7-8 mm.  MRI with and without pending CT repeat 2/20 stable hematoma and MLS 2D Echo  EF 60-65% EEG left frontal sharp waves,  moderate diffuse encephalopathy, no seizure LDL 57 HgbA1c 6.1 SCDs for VTE prophylaxis aspirin 81 mg daily bid prior to admission, now on No antithrombotic.  Keppra 500 -> 750 mg bid Ongoing aggressive stroke risk factor management Therapy recommendations:   SNF Disposition:  pending  Cerebral edema Repeat CT 2/17 5 mm rightward midline shift. CT repeat 2/18 MLS of 7-8 mm.  CT repeat 2/20 stable hematoma and midline shift Na 140-134-147-151-154-152-157 Put on 3% saline @ 50-> NS @ 50->25 Bolus with 23.4% x 1 Na monitoring with goal 150-155  Diabetes HgbA1c 6.1 goal < 7.0 Controlled CBG monitoring SSI DM education and close PCP follow up  Hypertension Stable on the high end On norvasc 10 and metoprolol 50 bid Off IV metoprolol BP goal < 160 Long term BP goal normotensive  Hyperlipidemia Home meds:  lipitor 40  LDL 57, goal < 70 Consider to resume lipitor on discharge  Fever Tmax 100.8 CXR and UA pending Close monitoring  AKI Creatinine 1.29--1.17--1.49--1.32--1.37 On IV fluid and tube feeding Monitoring BMP  Dysphagia Did not pass swallow Currently n.p.o. cortrak placed On tube feeding  Other Stroke Risk Factors Advanced age  Other Active Problems Anemia, hemoglobin 8.7--8.0--8.6--7.8--8.2, CBC monitoring Wife denies hx of dementia and stated that pt was functional before the fall earlier this month Recent fracture - fall with acute transverse fracture of the left femoral neck with varus angulation of the fracture fragments. Underwent hemiarthroplasty on 2/2.   Hospital day # 5  This patient is critically ill due to large frontal ICH, cerebral edema and at significant risk of neurological worsening, death form brain herniation, seizure. This patient's care requires constant monitoring of vital signs, hemodynamics, respiratory and cardiac monitoring, review of multiple databases, neurological assessment, discussion with family, other specialists and medical decision making of high complexity. I spent 35 minutes of neurocritical care time in the care of this patient.  Discussed with Dr. Denese Killings CCM  Marvel Plan, MD PhD Stroke Neurology 12/05/2023 6:54 PM    To contact Stroke Continuity provider, please refer  to [email or web address]. After hours, contact General Neurology

## 2023-12-05 NOTE — Progress Notes (Signed)
SLP Cancellation Note  Patient Details Name: William Todd MRN: [id number] DOB: 1950-11-02   Cancelled treatment:       Reason Eval/Treat Not Completed: Patient not medically ready. Discussed with RN - still not at a level to be ready for POs. Will continue to follow.     Mahala Menghini., M.A. CCC-SLP Acute Rehabilitation Services Office [phone]  Secure chat preferred  12/05/2023, 1:09 PM

## 2023-12-05 NOTE — Progress Notes (Signed)
NAME:  William Todd, MRN:  [id number], DOB:  April 21, 1951, LOS: 5 ADMISSION DATE:  11/29/2023, CONSULTATION DATE:  11/29/2023 REFERRING MD:  Dr. Clayborne Dana - EDP, CHIEF COMPLAINT:  SAH with IPH   History of Present Illness:  William Todd is a 73 y.o. male with a past medical history significant for HTN, recurrent mechanical falls, protein calorie malnutrition, type 2 diabetes, chronic kidney disease, and prostate cancer 2014 who presented to the ED via EMS from [facility] after suffering ground-level fall at [facility].  Workup on admission revealed acute intraparenchymal hemorrhage of the left frontal lobe with multiple bilateral small volume SAH doubt shift, neurosurgery was consulted with no surgical interventions recommended.  Patient was admitted initially per [facility].   While boarding in the emergency department patient had repeat CT scan to follow-up on ICH which revealed progressive left frontal hematoma with intraventricular extension into the fourth ventricle resulting in anterior midline shift of 5 mm, no hydrocephalus.  Neurosurgery reviewed imaging and recommended admission to ICU under PCCM for close monitoring.   Pertinent  Medical History  HTN, recurrent mechanical falls, protein calorie malnutrition, type 2 diabetes, chronic kidney disease, and prostate cancer 2014   Significant Hospital Events: Including procedures, antibiotic start and stop dates in addition to other pertinent events   2/15 presented after ground-level fall from SNF workup revealed ICH with bilateral [facility] 2/16 repeat CT while boarding in ED revealed progressive left frontal hematoma with intraventricular extension into the fourth ventricle resulting in anterior midline shift of 5 mm, no hydrocephalus.  Admission changed to ICU 2/18-CT angio, CT head with enlarging clot 2/20 - Stable left frontal hematoma with mild mass effect.  Interim History / Subjective:  Neurologically unchanged.  Resists with normal strength  throughout but not following commands. Occasionally answers questioned   Objective   Blood pressure 128/82, pulse 66, temperature 99.1 F (37.3 C), temperature source Axillary, resp. rate 18, weight 61 kg, SpO2 100%.        Intake/Output Summary (Last 24 hours) at 12/05/2023 1457 Last data filed at 12/05/2023 1400 Gross per 24 hour  Intake 2520.8 ml  Output 2150 ml  Net 370.8 ml   Filed Weights   12/03/23 0500 12/04/23 0500 12/05/23 0500  Weight: 57.9 kg 56.8 kg 61 kg   Examination: General: Thin man.  Lying in bed. HEENT: Moist oral mucosa Neuro: Responds symmetrically with normal strength in all 4 limbs to painful stimulation.  Resists eye opening.  Winces to pain.  I was not able to get him to speak.  CV: Normal heart sounds.  Extremities warm. PULM: No respiratory distress.  Chest clear bilaterally. GI: Soft with no distention. Extremities: warm/dry, no edema  Skin: no rashes or lesions   Ancillary test personally reviewed  Large left frontal intraparenchymal hemorrhage of different densities.  Some subarachnoid bleeding.  Imaging stable from prior study. Sodium 154.  Creatinine 1.37 Hyperglycemic on tube feeds. Anemia of chronic disease 8.2.  Assessment & Plan:   Left frontal intracerebral traumatic hematoma Bilateral subarachnoid hemorrhages secondary to trauma History of mood disorder History of memory impairment CKD stage IIIb Hypertension Type 2 diabetes History of frailty with recurrent mechanical falls.  Plan:  -Hematoma appears stable as does neurological examination.  Recovery will be protracted and degree of improvement as yet uncertain. -3% saline has been stopped.  Will allow sodium to drift down. - MRI to rule out amyloid angiopathy -If exam remains unchanged through weekend we will send to PCU.  Neurological recovery will take time given the size of the hematoma.  -BP better controlled. No change in therapy.   Aiming for long-term systolic blood  pressure less than 140. -Tube feed coverage started. -Avoid benzodiazepines for agitation as may contribute to furthering delirium.  Best Practice (right click and "Reselect all SmartList Selections" daily)   Diet/type: On tube feeds via smallbore feeding tube. DVT prophylaxis SCD can restart chemical prophylaxis given stable imaging. Pressure ulcer(s): N/A GI prophylaxis: PPI Lines: N/A Foley:  N/A Code Status:  full code Last date of multidisciplinary goals of care discussion: pending   Lynnell Catalan, MD [facility] ICU Physician [facility] [place] [facility]  Pager: [phone] Or Epic Secure Chat After hours: [phone].  12/05/2023, 2:57 PM

## 2023-12-06 ENCOUNTER — Inpatient Hospital Stay (HOSPITAL_COMMUNITY): Payer: Medicare HMO

## 2023-12-06 DIAGNOSIS — I10 Essential (primary) hypertension: Secondary | ICD-10-CM | POA: Diagnosis not present

## 2023-12-06 DIAGNOSIS — N1832 Chronic kidney disease, stage 3b: Secondary | ICD-10-CM | POA: Diagnosis not present

## 2023-12-06 DIAGNOSIS — E119 Type 2 diabetes mellitus without complications: Secondary | ICD-10-CM | POA: Diagnosis not present

## 2023-12-06 DIAGNOSIS — I609 Nontraumatic subarachnoid hemorrhage, unspecified: Secondary | ICD-10-CM

## 2023-12-06 LAB — GLUCOSE, CAPILLARY
Glucose-Capillary: 118 mg/dL — ABNORMAL HIGH (ref 70–99)
Glucose-Capillary: 121 mg/dL — ABNORMAL HIGH (ref 70–99)
Glucose-Capillary: 127 mg/dL — ABNORMAL HIGH (ref 70–99)
Glucose-Capillary: 225 mg/dL — ABNORMAL HIGH (ref 70–99)
Glucose-Capillary: 236 mg/dL — ABNORMAL HIGH (ref 70–99)
Glucose-Capillary: 279 mg/dL — ABNORMAL HIGH (ref 70–99)

## 2023-12-06 LAB — CBC
HCT: 27 % — ABNORMAL LOW (ref 39.0–52.0)
Hemoglobin: 8.4 g/dL — ABNORMAL LOW (ref 13.0–17.0)
MCH: 29.6 pg (ref 26.0–34.0)
MCHC: 31.1 g/dL (ref 30.0–36.0)
MCV: 95.1 fL (ref 80.0–100.0)
Platelets: 137 10*3/uL — ABNORMAL LOW (ref 150–400)
RBC: 2.84 MIL/uL — ABNORMAL LOW (ref 4.22–5.81)
RDW: 15.2 % (ref 11.5–15.5)
WBC: 9.4 10*3/uL (ref 4.0–10.5)
nRBC: 0 % (ref 0.0–0.2)

## 2023-12-06 LAB — SODIUM
Sodium: 158 mmol/L — ABNORMAL HIGH (ref 135–145)
Sodium: 157 mmol/L — ABNORMAL HIGH (ref 135–145)
Sodium: 154 mmol/L — ABNORMAL HIGH (ref 135–145)
Sodium: 157 mmol/L — ABNORMAL HIGH (ref 135–145)

## 2023-12-06 LAB — BASIC METABOLIC PANEL
Anion gap: 13 (ref 5–15)
BUN: 29 mg/dL — ABNORMAL HIGH (ref 8–23)
CO2: 27 mmol/L (ref 22–32)
Calcium: 9.5 mg/dL (ref 8.9–10.3)
Chloride: 118 mmol/L — ABNORMAL HIGH (ref 98–111)
Creatinine, Ser: 1.47 mg/dL — ABNORMAL HIGH (ref 0.61–1.24)
GFR, Estimated: 50 mL/min — ABNORMAL LOW (ref >60)
Glucose, Bld: 142 mg/dL — ABNORMAL HIGH (ref 70–99)
Potassium: 3.8 mmol/L (ref 3.5–5.1)
Sodium: 158 mmol/L — ABNORMAL HIGH (ref 135–145)

## 2023-12-06 MED ORDER — HEPARIN SODIUM (PORCINE) 5000 UNIT/ML IJ SOLN
5000.0000 [IU] | Freq: Two times a day (BID) | INTRAMUSCULAR | Status: DC
Start: 2023-12-06 — End: 2024-01-01
  Administered 2023-12-06 – 2024-01-01 (×50): 5000 [IU] via SUBCUTANEOUS
  Filled 2023-12-06 (×52): qty 1

## 2023-12-06 MED ORDER — FREE WATER
100.0000 mL | Status: DC
  Administered 2023-12-06 – 2023-12-07 (×8): 100 mL

## 2023-12-06 MED ORDER — AMANTADINE HCL 50 MG/5ML PO SOLN
100.0000 mg | Freq: Two times a day (BID) | ORAL | Status: DC
  Administered 2023-12-06 – 2024-01-01 (×50): 100 mg
  Filled 2023-12-06 (×55): qty 10

## 2023-12-06 MED ORDER — AMANTADINE HCL 50 MG/5ML PO SOLN
100.0000 mg | Freq: Two times a day (BID) | ORAL | Status: DC
  Filled 2023-12-06: qty 10

## 2023-12-06 NOTE — Plan of Care (Signed)
  Problem: Metabolic: Goal: Ability to maintain appropriate glucose levels will improve Outcome: Progressing   Problem: Nutritional: Goal: Maintenance of adequate nutrition will improve Outcome: Progressing   Problem: Clinical Measurements: Goal: Ability to maintain clinical measurements within normal limits will improve Outcome: Progressing   Problem: Elimination: Goal: Will not experience complications related to urinary retention Outcome: Progressing   Problem: Activity: Goal: Risk for activity intolerance will decrease Outcome: Not Progressing   Problem: Elimination: Goal: Will not experience complications related to bowel motility Outcome: Not Progressing   Problem: Self-Care: Goal: Ability to communicate needs accurately will improve Outcome: Not Progressing

## 2023-12-06 NOTE — Progress Notes (Signed)
 NAME:  William Todd, MRN:  [id number], DOB:  1950/12/29, LOS: 6 ADMISSION DATE:  11/29/2023, CONSULTATION DATE:  11/29/2023 REFERRING MD:  Dr. Clayborne Dana - EDP, CHIEF COMPLAINT:  SAH with IPH   History of Present Illness:  William Todd is a 73 y.o. male with a past medical history significant for HTN, recurrent mechanical falls, protein calorie malnutrition, type 2 diabetes, chronic kidney disease, and prostate cancer 2014 who presented to the ED via EMS from [facility] after suffering ground-level fall at [facility].  Workup on admission revealed acute intraparenchymal hemorrhage of the left frontal lobe with multiple bilateral small volume SAH doubt shift, neurosurgery was consulted with no surgical interventions recommended.  Patient was admitted initially per [facility].   While boarding in the emergency department patient had repeat CT scan to follow-up on ICH which revealed progressive left frontal hematoma with intraventricular extension into the fourth ventricle resulting in anterior midline shift of 5 mm, no hydrocephalus.  Neurosurgery reviewed imaging and recommended admission to ICU under PCCM for close monitoring.   Pertinent  Medical History  HTN, recurrent mechanical falls, protein calorie malnutrition, type 2 diabetes, chronic kidney disease, and prostate cancer 2014   Significant Hospital Events: Including procedures, antibiotic start and stop dates in addition to other pertinent events   2/15 presented after ground-level fall from [facility] workup revealed ICH with bilateral [facility] 2/16 repeat CT while boarding in ED revealed progressive left frontal hematoma with intraventricular extension into the fourth ventricle resulting in anterior midline shift of 5 mm, no hydrocephalus.  Admission changed to ICU 2/18-CT angio, CT head with enlarging clot 2/20 - Stable left frontal hematoma with mild mass effect.  Interim History / Subjective:  Tube feeds stopped last night as SBFT migrated back into  esophagus on CXR.   Objective   Blood pressure 134/61, pulse 88, temperature (!) 100.5 F (38.1 C), temperature source Axillary, resp. rate 19, weight 60.1 kg, SpO2 100%.        Intake/Output Summary (Last 24 hours) at 12/06/2023 0833 Last data filed at 12/06/2023 0800 Gross per 24 hour  Intake 1523.16 ml  Output 1125 ml  Net 398.16 ml   Filed Weights   12/04/23 0500 12/05/23 0500 12/06/23 0500  Weight: 56.8 kg 61 kg 60.1 kg   Examination: General: Thin man.  Lying in bed. HEENT: Moist oral mucosa, SBFT in place with bridle at 50cm Neuro: Responds symmetrically with normal strength, more briskly on the left side.  CV: Normal heart sounds.  Extremities warm. PULM: No respiratory distress.  Chest clear bilaterally. GI: Soft with no distention. Extremities: warm/dry, no edema  Skin: no rashes or lesions   Ancillary test personally reviewed  2/20 Large left frontal intraparenchymal hemorrhage of different densities.  Some subarachnoid bleeding.  Imaging stable from prior study. Sodium 158.  Creatinine 1.47 Glycemic control has improved.  Anemia of chronic disease 8.4 MRI shows stable hemorrhage.  Assessment & Plan:   Left frontal intracerebral traumatic hematoma Bilateral subarachnoid hemorrhages secondary to trauma History of mood disorder History of memory impairment CKD stage IIIb Hypertension Type 2 diabetes History of frailty with recurrent mechanical falls.  Plan:  -Exam remains unchanged. Lack of cooperation may be due to frontal lobe bleed causing abulia.  - May respond to amantadine. - Sodium has increased further due to tube feed interruption. I advanced the SBFT. Will repeat KUB.  - Start low dose free water and continue to follow Na. Hypernatremia may also be contributing to lethargy.  -  Continue current insulin dosing.  - Continue current antihypertensives - Constipation should improve with correction of hypernatremia.   Best Practice (right click and  "Reselect all SmartList Selections" daily)   Diet/type: On tube feeds via smallbore feeding tube. DVT prophylaxis SCD can restart chemical prophylaxis given stable imaging. Pressure ulcer(s): N/A GI prophylaxis: PPI Lines: N/A Foley:  N/A Code Status:  full code Last date of multidisciplinary goals of care discussion: pending   Lynnell Catalan, MD [facility] ICU Physician [facility]  Pager: [phone] Or Epic Secure Chat After hours: [phone].  12/06/2023, 8:33 AM

## 2023-12-06 NOTE — Progress Notes (Signed)
 STROKE TEAM PROGRESS NOTE   SUBJECTIVE (INTERVAL HISTORY) His RN is at the bedside. Pt no acute event overnight, neuro unchanged. No family at bedside, Na 158, held, and patient on free water flushes.    OBJECTIVE Temp:  [98.2 F (36.8 C)-100.5 F (38.1 C)] 99.4 F (37.4 C) (02/22 1200) Pulse Rate:  [72-119] 81 (02/22 1600) Cardiac Rhythm: Normal sinus rhythm (02/22 0600) Resp:  [16-26] 19 (02/22 1600) BP: (97-141)/(56-73) 116/61 (02/22 1600) SpO2:  [94 %-100 %] 100 % (02/22 1600) Weight:  [60.1 kg] 60.1 kg (02/22 0500)  Recent Labs  Lab 12/05/23 2355 12/06/23 0352 12/06/23 0806 12/06/23 1137 12/06/23 1619  GLUCAP 128* 121* 127* 236* 279*   Recent Labs  Lab 12/01/23 1610 12/02/23 0624 12/02/23 1444 12/03/23 0456 12/03/23 0929 12/04/23 0431 12/04/23 0828 12/05/23 0822 12/05/23 1555 12/05/23 1916 12/06/23 0126 12/06/23 0415 12/06/23 0748 12/06/23 1340  NA  --  134*   < > 147*   < > 151*   < > 154*   < > 157* 158* 158* 157* 154*  K  --  3.9  --  3.7  --  3.6  --  4.2  --   --   --  3.8  --   --   CL  --  97*  --  112*  --  117*  --  121*  --   --   --  118*  --   --   CO2  --  24  --  22  --  27  --  27  --   --   --  27  --   --   GLUCOSE  --  215*  --  173*  --  362*  --  284*  --   --   --  142*  --   --   BUN  --  19  --  16  --  18  --  25*  --   --   --  29*  --   --   CREATININE  --  1.17  --  1.49*  --  1.32*  --  1.37*  --   --   --  1.47*  --   --   CALCIUM  --  8.6*  --  9.2  --  8.7*  --  8.7*  --   --   --  9.5  --   --   MG 1.7 1.7  --  1.8  --   --   --  2.1  --   --   --   --   --   --   PHOS 3.7 3.4  --  3.0  --   --   --   --   --   --   --   --   --   --    < > = values in this interval not displayed.   Recent Labs  Lab 11/29/23 2342 11/30/23 0400  AST 43* 43*  ALT 51* 45*  ALKPHOS 89 90  BILITOT 0.6 0.6  PROT 6.6 6.1*  ALBUMIN 3.1* 2.9*   Recent Labs  Lab 11/29/23 2342 11/30/23 0400 12/02/23 0624 12/03/23 0456 12/04/23 0431  12/05/23 0822 12/06/23 0415  WBC 10.5   < > 8.9 7.7 6.8 9.1 9.4  NEUTROABS 8.9*  --   --   --   --   --   --   HGB 9.6*   < >  8.0* 8.6* 7.8* 8.2* 8.4*  HCT 30.0*   < > 23.9* 26.1* 24.2* 25.9* 27.0*  MCV 94.6   < > 90.9 92.2 93.4 95.9 95.1  PLT 212   < > 164 156 145* 137* 137*   < > = values in this interval not displayed.   No results for input(s): "CKTOTAL", "CKMB", "CKMBINDEX", "TROPONINI" in the last 168 hours. No results for input(s): "LABPROT", "INR" in the last 72 hours.       Component Value Date/Time   CHOL 111 12/02/2023 0624   TRIG 63 12/02/2023 0624   HDL 41 12/02/2023 0624   CHOLHDL 2.7 12/02/2023 0624   VLDL 13 12/02/2023 0624   LDLCALC 57 12/02/2023 0624   Lab Results  Component Value Date   HGBA1C 6.1 (H) 12/02/2023     I have personally reviewed the radiological images below and agree with the radiology interpretations.  DG Abd Portable 1V Result Date: 12/06/2023 CLINICAL DATA:  Nasogastric tube placement. EXAM: PORTABLE ABDOMEN - 1 VIEW COMPARISON:  December 03, 2023. FINDINGS: Distal tip of feeding tube is seen in expected position of proximal stomach. IMPRESSION: Distal tip of feeding tube is seen in expected position of proximal stomach. Electronically Signed   By: Lupita Raider M.D.   On: 12/06/2023 09:19   DG CHEST PORT 1 VIEW Result Date: 12/05/2023 CLINICAL DATA:  Fever EXAM: PORTABLE CHEST 1 VIEW COMPARISON:  11/29/2023 FINDINGS: Enteric tube tip at the distal esophagus. Lung fields are clear. Cardiac size is normal. Aortic atherosclerosis. IMPRESSION: No active disease. Enteric tube tip at the distal esophagus, recommend advancement for more optimal positioning. Electronically Signed   By: Jasmine Pang M.D.   On: 12/05/2023 21:13   MR BRAIN W WO CONTRAST Result Date: 12/05/2023 CLINICAL DATA:  Hemorrhagic stroke EXAM: MRI HEAD WITHOUT AND WITH CONTRAST TECHNIQUE: Multiplanar, multiecho pulse sequences of the brain and surrounding structures were  obtained without and with intravenous contrast. CONTRAST:  6mL GADAVIST GADOBUTROL 1 MMOL/ML IV SOLN COMPARISON:  Head CT 12/04/2023 FINDINGS: Brain: Unchanged size of large left frontal intraparenchymal hematoma. Blood layering within both lateral ventricles. Subarachnoid blood over both hemispheres. There is multifocal hyperintense T2-weighted signal within the white matter. Generalized volume loss. There is 4 mm of anterior rightward midline shift. The midline structures are normal. There is no abnormal contrast enhancement. Vascular: Normal flow voids. Skull and upper cervical spine: Normal calvarium and skull base. Visualized upper cervical spine and soft tissues are normal. Sinuses/Orbits:No paranasal sinus fluid levels or advanced mucosal thickening. No mastoid or middle ear effusion. Normal orbits. IMPRESSION: 1. Unchanged size of large left frontal intraparenchymal hematoma with 4 mm of anterior rightward midline shift. 2. Blood layering within both lateral ventricles. 3. Subarachnoid blood over both hemispheres. Electronically Signed   By: Deatra Robinson M.D.   On: 12/05/2023 19:24   CT HEAD WO CONTRAST ( ) Result Date: 12/04/2023 CLINICAL DATA:  73 year old male with multifocal intracranial hemorrhage after a fall. Subsequent encounter. EXAM: CT HEAD WITHOUT CONTRAST TECHNIQUE: Contiguous axial images were obtained from the base of the skull through the vertex without intravenous contrast. RADIATION DOSE REDUCTION: This exam was performed according to the departmental dose-optimization program which includes automated exposure control, adjustment of the mA and/or kV according to patient size and/or use of iterative reconstruction technique. COMPARISON:  12/02/2023 and earlier. FINDINGS: Brain: Left anterior frontal lobe intra-axial hemorrhage remains hyperdense and amorphous. Due to scan angulation the most direct comparison now is on sagittal images (series  6, image 39 today versus the same series  and image 2 days ago), where hemorrhage size and configuration appear unchanged. Regional edema and mass effect also unchanged. Scattered bilateral subarachnoid blood has not significantly changed. Small volume layering intraventricular hemorrhage has not significantly changed. Stable ventricle size and configuration, no ventriculomegaly. Stable mild rightward midline shift limited to the anterior septum pellucidum. Basilar cisterns are stable. Stable gray-white matter differentiation throughout the brain. Left frontal lobe edema surrounding the hematoma and chronic right PCA territory infarct with encephalomalacia. Vascular: No suspicious intracranial vascular hyperdensity. Skull: Intact, stable. Incidental large maxillary torus exostoses incidentally noted. Sinuses/Orbits: Left nasoenteric tube now in place. Visualized paranasal sinuses and mastoids are stable and well aerated. Other: Stable orbit and scalp soft tissues. IMPRESSION: 1. Continued stability of large anterior left frontal lobar hemorrhage, associated edema. Relatively mild intracranial mass effect, scattered small volume bilateral SAH, and IVH remain stable. 2. No new intracranial abnormality. Chronic right PCA territory infarct. Electronically Signed   By: Odessa Fleming M.D.   On: 12/04/2023 06:37   EEG adult Result Date: 12/03/2023 Charlsie Quest, MD     12/03/2023  4:09 PM Patient Name: William Todd MRN: [id number] Epilepsy Attending: Charlsie Quest Referring Physician/Provider: Elmer Picker, NP Date: 12/03/2023 Duration: 23.45 mins Patient history: 73yo M with left frontal ICH. EEG to evaluate for seizure Level of alertness: Awake, asleep AEDs during EEG study: LEV Technical aspects: This EEG study was done with scalp electrodes positioned according to the 10-20 International system of electrode placement. Electrical activity was reviewed with band pass filter of 1-70Hz , sensitivity of 7 uV/mm, display speed of 36mm/sec with a 60Hz  notched  filter applied as appropriate. EEG data were recorded continuously and digitally stored.  Video monitoring was available and reviewed as appropriate. Description: No clear posterior dominant rhythm was seen. Sleep was characterized by sleep spindles (12 to 14 Hz), maximal frontocentral region. EEG showed continuous generalized and maximal left frontal 3 to 6 Hz theta-delta slowing. Sharp waves were noted in left frontal region,at times periodic at 1hz . Hyperventilation and photic stimulation were not performed.   ABNORMALITY - Sharp wave, left frontal region - Continuous slow, generalized and maximal left frontal region IMPRESSION: This study showed evidence of epileptogenicity and cortical dysfunction arising from left frontal region likely secondary to underlying ICH. Additionally there is moderate diffuse encephalopathy. No seizures were seen throughout the recording. If suspicion for ictal activity remains a concern, a prolonged study can be considered. Charlsie Quest   DG Abd Portable 1V Result Date: 12/03/2023 CLINICAL DATA:  73 year old male feeding tube placement. EXAM: PORTABLE ABDOMEN - 1 VIEW COMPARISON:  12/01/2023 and earlier. FINDINGS: Portable AP semi upright view at 1038 hours. Enteric feeding tube is in place within the proximal stomach, looped at the level of the gastric fundus. Negative lung bases. Nonobstructed bowel gas pattern. No acute osseous abnormality identified. IMPRESSION: Enteric feeding tube placed into the proximal stomach. Electronically Signed   By: Odessa Fleming M.D.   On: 12/03/2023 11:19   CT HEAD WO CONTRAST ( ) Result Date: 12/02/2023 CLINICAL DATA:  73 year old male with multifocal intracranial hemorrhage status post fall. Hemorrhage progression after presentation. EXAM: CT HEAD WITHOUT CONTRAST TECHNIQUE: Contiguous axial images were obtained from the base of the skull through the vertex without intravenous contrast. RADIATION DOSE REDUCTION: This exam was performed  according to the departmental dose-optimization program which includes automated exposure control, adjustment of the mA and/or kV according to patient size and/or use  of iterative reconstruction technique. COMPARISON:  CTA head and neck this morning. Head CT yesterday and earlier. FINDINGS: Brain: Large volume mixed density but mostly hyperdense intra-axial hemorrhage in the anterior left frontal lobe encompasses 78 by 44 x 63 mm (AP by transverse by CC) versus 79 x 45 x 61 mm yesterday when measured with the same technique. Volume estimated at 108 mL, versus 105 mL on 11/30/2023, and hemorrhage size and configuration appears visually stable. Superimposed scattered bilateral subarachnoid blood and a moderate volume of mostly now layering lateral intraventricular blood also appears stable. Rightward midline shift at the anterior septum pellucidum of 7-8 mm has increased from 5-6 mm on 11/30/2023. Ventricle size and configuration is stable. Stable basilar cistern patency. Superimposed chronic right PCA territory encephalomalacia and confluent bilateral cerebral white matter hypodensity. Vascular: Calcified atherosclerosis at the skull base. No suspicious intracranial vascular hyperdensity. Skull: Stable, intact. Sinuses/Orbits: Visualized paranasal sinuses and mastoids are stable and well aerated. Other: Stable, negative orbit and scalp soft tissues. IMPRESSION: 1. Large volume anterior frontal lobe intra-axial hemorrhage does not appear significantly changed in size or configuration since 11/30/2023. Using largest orthogonal dimensions, estimated blood volume is 108 mL now vs. 105 mL then. 2. Intracranial mass effect with mildly increased rightward midline shift since 11/30/2023, now 7-8 mm. Basilar cisterns remain patent. 3. Stable bilateral SAH and IVH. Stable ventricle size and configuration. 4. Chronic right PCA territory infarct. Nonspecific white matter changes. Electronically Signed   By: Odessa Fleming M.D.   On:  12/02/2023 11:47   CT ANGIO HEAD NECK W WO CM Result Date: 12/02/2023 CLINICAL DATA:  73 year old male with multifocal intracranial hemorrhage status post fall. EXAM: CT ANGIOGRAPHY HEAD AND NECK TECHNIQUE: Multidetector CT imaging of the head and neck was performed using the standard protocol during bolus administration of intravenous contrast. Multiplanar CT image reconstructions and MIPs were obtained to evaluate the vascular anatomy. Carotid stenosis measurements (when applicable) are obtained utilizing NASCET criteria, using the distal internal carotid diameter as the denominator. RADIATION DOSE REDUCTION: This exam was performed according to the departmental dose-optimization program which includes automated exposure control, adjustment of the mA and/or kV according to patient size and/or use of iterative reconstruction technique. CONTRAST:  75mL OMNIPAQUE IOHEXOL 350 MG/ML SOLN COMPARISON:  Head CTs 12/01/2023 and earlier. Brain MRI 11/15/2023. FINDINGS: CTA NECK Skeleton: Cervical spine degeneration. 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 No acute osseous abnormality identified. Upper chest: Upper lungs are clear. Negative visible superior mediastinum. Other neck: Neck soft tissue spaces appear within normal limits. Aortic arch: Calcified aortic atherosclerosis.  3 vessel arch. Right carotid system: No significant brachiocephalic artery or or right CCA origin atherosclerosis. Soft and calcified plaque of the right CCA before the bifurcation without stenosis. Bulky calcified atherosclerosis of the proximal right ICA beginning at the origin but most pronounced at the bulb. Subsequent distal bulb stenosis is numerically estimated at 60 % with respect to the distal vessel (series 7, image 98 and series 9, image 155). Right ICA remains patent to the skull base. Left carotid system: Minimal left CCA atherosclerosis before the bifurcation. Soft and calcified  plaque at the left ICA origin and more pronounced at the bulb. Up to 50 % stenosis with respect to the distal vessel. Additional calcified plaque of the left ICA just below the skull base without stenosis. Vertebral arteries: Mild proximal right subclavian atherosclerosis without stenosis. Calcified plaque at the right vertebral artery origin with only mild stenosis on series 8, image  145. Right vertebral is patent to the skull base with no significant plaque or stenosis. Proximal left subclavian artery soft and calcified plaque without stenosis. Mostly soft plaque at the left vertebral artery origin with up to moderate origin stenosis on series 8, image 135. Left vertebral remains patent and is fairly codominant in the neck. Mild left V3 segment calcified plaque with no additional significant stenosis to the skull base. CTA HEAD Posterior circulation: Bilateral V4 segment atherosclerosis. Bulky and severe right V4 calcified plaque and stenosis on series 5, image 145. The right vertebrobasilar junction remains patent. Contralateral moderate left V4 segment stenosis proximal to the left PICA origin which remains normal. Right PICA origin is patent. Patent left vertebrobasilar junction. Patent basilar artery without stenosis. Fetal type bilateral PCA origins. Patent SCA origins. Bilateral PCA branches are within normal limits. Anterior circulation: Both ICA siphons are patent. Left siphon cavernous segment calcified plaque is moderate with mild to moderate stenosis at the anterior genu. Normal left posterior communicating artery origin. Right ICA cavernous through supraclinoid segment calcified plaque is moderate with mild to moderate supraclinoid stenosis. Normal right posterior communicating artery origin. Patent carotid termini. Patent MCA origins. Dominant right and diminutive or absent left ACA A1 segments. Anterior communicating artery and bilateral ACA branches are within normal limits. Left MCA M1 segment and  bifurcation are patent without stenosis. There is mild mass effect on the left MCA branches which otherwise appear within normal limits. Right MCA M1 segment and trifurcation are patent without stenosis. Right MCA branches are within normal limits. No CTA spot sign or abnormal vascularity associated with the left anterior superior frontal lobe intra-axial hemorrhage. Other findings: Grossly stable intracranial hemorrhage, mass effect from the head CT yesterday. Venous sinuses: Early contrast timing. Superior sagittal sinus, torcula, straight sinus, vein of Galen and internal cerebral veins, dominant appearing right transverse and sigmoid venous sinuses appear to be enhancing and patent. Anatomic variants: Fetal type bilateral PCA origins. Dominant right ACA A1. Review of the MIP images confirms the above findings IMPRESSION: 1. Negative for CTA spot sign in association with acute intracranial hemorrhage. Mild mass effect on left MCA vasculature. Negative for intracranial aneurysm, vascular malformation, or large vessel occlusion. 2. Atherosclerosis in the head and neck notable for: - 60% stenosis of the Right ICA bulb. - Moderate To Severe bilateral distal vertebral artery stenosis. - Moderate Left Vertebral Artery origin stenosis. - mild to moderate bilateral ICA siphon stenosis. 3.  Aortic Atherosclerosis (ICD10-I70.0). Electronically Signed   By: Odessa Fleming M.D.   On: 12/02/2023 06:41   DG Abd Portable 1V Result Date: 12/01/2023 CLINICAL DATA:  NG placement. EXAM: PORTABLE ABDOMEN - 1 VIEW COMPARISON:  CT abdomen pelvis dated 07/01/2013. FINDINGS: Feeding tube with tip in the left upper abdomen likely in the region of the gastric fundus. Large amount of stool throughout the colon. IMPRESSION: Feeding tube with tip in the region of the gastric fundus. Electronically Signed   By: Elgie Collard M.D.   On: 12/01/2023 18:55   CT HEAD WO CONTRAST ( ) Result Date: 12/01/2023 CLINICAL DATA:  Follow-up of  intracranial hemorrhage. EXAM: CT HEAD WITHOUT CONTRAST TECHNIQUE: Contiguous axial images were obtained from the base of the skull through the vertex without intravenous contrast. RADIATION DOSE REDUCTION: This exam was performed according to the departmental dose-optimization program which includes automated exposure control, adjustment of the mA and/or kV according to patient size and/or use of iterative reconstruction technique. COMPARISON:  None Available. FINDINGS: Brain: Redemonstrated intraparenchymal hematoma  in the anterior left frontal lobe which measures 7.9 x 4.9 x 3.6 cm, previously measuring 7.8 x 5.2 x 3.4 cm when remeasured in a similar manner. Similar surrounding edema and associated mass effect. Approximately 5 mm rightward midline shift anteriorly similar to prior. Extension of hemorrhage into the adjacent left frontal horn with similar appearance of blood products in the left lateral ventricle. Increased blood products within the atrium and occipital horn of the right lateral ventricle. Slightly decreased blood products in the third ventricle. Small focus of subdural hemorrhage along the anterior falx. Scattered areas of subarachnoid hemorrhage along the right frontal lobe and right sylvian fissure again noted. Remote infarct in the right occipital lobe. Vascular: No hyperdense vessel or unexpected calcification. Skull: Normal. Negative for fracture or focal lesion. Sinuses/Orbits: No acute finding. Other: Mastoid air cells are clear. IMPRESSION: Similar appearance of intraparenchymal hematoma in the left frontal lobe with associated mass effect and 5 mm rightward midline shift. Additional small multi compartment intracranial hemorrhage as above. Slightly increased blood products within the atrium and occipital horn of the right lateral ventricle. Electronically Signed   By: Emily Filbert M.D.   On: 12/01/2023 13:34   ECHOCARDIOGRAM COMPLETE Result Date: 11/30/2023    ECHOCARDIOGRAM REPORT    Patient Name:   William Todd Date of Exam: 11/30/2023 Medical Rec #:  [id number]        Height:       72.0 in Accession #:    [id number]       Weight:       147.0 lb Date of Birth:  05-08-1951         BSA:          1.869 m Patient Age:    72 years         BP:           129/68 mmHg Patient Gender: M                HR:           77 bpm. Exam Location:  Inpatient Procedure: 2D Echo (Both Spectral and Color Flow Doppler were utilized during            procedure). Indications:    intracerebral hemorrhage  History:        Patient has prior history of Echocardiogram examinations, most                 recent 01/02/2013. Risk Factors:Hypertension, Diabetes and                 Dyslipidemia.  Sonographer:    Delcie Roch RDCS Referring Phys: [phone] WHITNEY D HARRIS IMPRESSIONS  1. Left ventricular ejection fraction, by estimation, is 60 to 65%. The left ventricle has normal function. The left ventricle has no regional wall motion abnormalities. Left ventricular diastolic parameters were normal.  2. Right ventricular systolic function is normal. The right ventricular size is normal. There is mildly elevated pulmonary artery systolic pressure.  3. The mitral valve is degenerative. Mild mitral valve regurgitation. No evidence of mitral stenosis.  4. The aortic valve is tricuspid. There is mild calcification of the aortic valve. Aortic valve regurgitation is not visualized. Aortic valve sclerosis is present, with no evidence of aortic valve stenosis.  5. The inferior vena cava is normal in size with greater than 50% respiratory variability, suggesting right atrial pressure of 3 mmHg. FINDINGS  Left Ventricle: Left ventricular ejection fraction, by estimation, is 60 to 65%.  The left ventricle has normal function. The left ventricle has no regional wall motion abnormalities. Strain imaging was not performed. The left ventricular internal cavity  size was normal in size. There is no left ventricular hypertrophy. Left ventricular  diastolic parameters were normal. Right Ventricle: The right ventricular size is normal. No increase in right ventricular wall thickness. Right ventricular systolic function is normal. There is mildly elevated pulmonary artery systolic pressure. The tricuspid regurgitant velocity is 2.89  m/s, and with an assumed right atrial pressure of 3 mmHg, the estimated right ventricular systolic pressure is 36.4 mmHg. Left Atrium: Left atrial size was normal in size. Right Atrium: Right atrial size was normal in size. Pericardium: There is no evidence of pericardial effusion. Mitral Valve: The mitral valve is degenerative in appearance. There is mild calcification of the anterior mitral valve leaflet(s). Mild mitral valve regurgitation. No evidence of mitral valve stenosis. Tricuspid Valve: The tricuspid valve is grossly normal. Tricuspid valve regurgitation is mild . No evidence of tricuspid stenosis. Aortic Valve: The aortic valve is tricuspid. There is mild calcification of the aortic valve. Aortic valve regurgitation is not visualized. Aortic valve sclerosis is present, with no evidence of aortic valve stenosis. Pulmonic Valve: The pulmonic valve was grossly normal. Pulmonic valve regurgitation is not visualized. No evidence of pulmonic stenosis. Aorta: The aortic root and ascending aorta are structurally normal, with no evidence of dilitation. Venous: The inferior vena cava is normal in size with greater than 50% respiratory variability, suggesting right atrial pressure of 3 mmHg. IAS/Shunts: The atrial septum is grossly normal. Additional Comments: 3D imaging was not performed.  LEFT VENTRICLE PLAX 2D LVIDd:         3.20 cm   Diastology LVIDs:         1.70 cm   LV e' medial:    8.16 cm/s LV PW:         1.20 cm   LV E/e' medial:  10.7 LV IVS:        1.00 cm   LV e' lateral:   11.20 cm/s LVOT diam:     2.40 cm   LV E/e' lateral: 7.8 LV SV:         115 LV SV Index:   61 LVOT Area:     4.52 cm  RIGHT VENTRICLE              IVC RV Basal diam:  2.80 cm     IVC diam: 1.70 cm RV S prime:     12.90 cm/s TAPSE (M-mode): 2.2 cm LEFT ATRIUM             Index        RIGHT ATRIUM           Index LA diam:        3.30 cm 1.77 cm/m   RA Area:     13.00 cm LA Vol (A2C):   45.6 ml 24.39 ml/m  RA Volume:   29.50 ml  15.78 ml/m LA Vol (A4C):   39.1 ml 20.92 ml/m LA Biplane Vol: 43.8 ml 23.43 ml/m  AORTIC VALVE LVOT Vmax:   125.00 cm/s LVOT Vmean:  83.200 cm/s LVOT VTI:    0.254 m  AORTA Ao Root diam: 3.30 cm Ao Asc diam:  3.20 cm MITRAL VALVE               TRICUSPID VALVE MV Area (PHT): 3.37 cm    TR Peak grad:   33.4 mmHg MV  Decel Time: 225 msec    TR Vmax:        289.00 cm/s MR Peak grad: 113.2 mmHg MR Mean grad: 73.0 mmHg    SHUNTS MR Vmax:      532.00 cm/s  Systemic VTI:  0.25 m MR Vmean:     403.0 cm/s   Systemic Diam: 2.40 cm MV E velocity: 87.30 cm/s MV A velocity: 70.40 cm/s MV E/A ratio:  1.24 Lennie Odor MD Electronically signed by Lennie Odor MD Signature Date/Time: 11/30/2023/8:57:24 PM    Final    CT Head Wo Contrast Result Date: 11/30/2023 CLINICAL DATA:  Follow-up head trauma EXAM: CT HEAD WITHOUT CONTRAST TECHNIQUE: Contiguous axial images were obtained from the base of the skull through the vertex without intravenous contrast. RADIATION DOSE REDUCTION: This exam was performed according to the departmental dose-optimization program which includes automated exposure control, adjustment of the mA and/or kV according to patient size and/or use of iterative reconstruction technique. COMPARISON:  Head CT from the same day at 2 a.m. FINDINGS: Brain: Growing hematoma in the anterior left frontal lobe, hemorrhagic area measuring 7.3 x 5 x 3.4 cm. The hemorrhage has now decompressed into the left lateral ventricle with tracking to the level of the fourth ventricle. No hydrocephalus. Patchy subarachnoid hemorrhage along the frontal lobes and sylvian fissures which appears similar. The hemorrhage and edema causes progressive swelling  with anterior midline shift measuring 5 mm. Chronic right occipital infarct. Low-density in the cerebral white matter from chronic small vessel ischemia. Vascular: No hyperdense vessel or unexpected calcification. Skull: No acute finding Sinuses/Orbits: No evidence of injury Critical Value/emergent results were called by telephone at the time of interpretation on 11/30/2023 at 8:45 am to provider Ogbata, who verbally acknowledged these results. IMPRESSION: Progressive left frontal hematoma now measuring 60 cc with new intraventricular extension reaching the fourth ventricle. Patchy bilateral subarachnoid hemorrhage appears similar to prior. The growing hematoma and edema causes anterior midline shift by 5 mm. No hydrocephalus. Electronically Signed   By: Tiburcio Pea M.D.   On: 11/30/2023 08:46   CT Head Wo Contrast Result Date: 11/30/2023 CLINICAL DATA:  Head trauma, minor (Age >= 65y); Neck trauma (Age >= 65y) EXAM: CT HEAD WITHOUT CONTRAST CT CERVICAL SPINE WITHOUT CONTRAST TECHNIQUE: Multidetector CT imaging of the head and cervical spine was performed following the standard protocol without intravenous contrast. Multiplanar CT image reconstructions of the cervical spine were also generated. RADIATION DOSE REDUCTION: This exam was performed according to the departmental dose-optimization program which includes automated exposure control, adjustment of the mA and/or kV according to patient size and/or use of iterative reconstruction technique. COMPARISON:  MRI head November 15, 2023. FINDINGS: CT HEAD FINDINGS Brain: Acute intraparenchymal hemorrhage in the left frontal lobe measuring 2.6 cm with overlying extra-axial extension. Small volume of multifocal subarachnoid hemorrhage bilaterally along the right frontal convexity, left frontal convexity and layering within the left sylvian fissure. No midline shift. No hydrocephalus. Remote right occipital infarct. Vascular: Calcific atherosclerosis.  No dense  vessel. Skull: No acute fracture. Sinuses/Orbits: Clear sinuses.  No acute orbital findings. Other: No mastoid effusions. CT CERVICAL SPINE FINDINGS Alignment: No substantial sagittal subluxation. Skull base and vertebrae: No acute fracture. Soft tissues and spinal canal: No prevertebral fluid or swelling. No visible canal hematoma. Disc levels:  Moderate multilevel degenerative change. Upper chest: Visualized lung apices are clear. IMPRESSION: 1. Acute intraparenchymal hemorrhage in left frontal lobe with multifocal bilateral small volume subarachnoid hemorrhage. No midline shift. 2. No acute fracture  or traumatic malalignment in the cervical spine. Findings discussed with Dr. Clayborne Dana via telephone at 2:19 a.m. Electronically Signed   By: Feliberto Harts M.D.   On: 11/30/2023 02:21   CT Cervical Spine Wo Contrast Result Date: 11/30/2023 CLINICAL DATA:  Head trauma, minor (Age >= 65y); Neck trauma (Age >= 65y) EXAM: CT HEAD WITHOUT CONTRAST CT CERVICAL SPINE WITHOUT CONTRAST TECHNIQUE: Multidetector CT imaging of the head and cervical spine was performed following the standard protocol without intravenous contrast. Multiplanar CT image reconstructions of the cervical spine were also generated. RADIATION DOSE REDUCTION: This exam was performed according to the departmental dose-optimization program which includes automated exposure control, adjustment of the mA and/or kV according to patient size and/or use of iterative reconstruction technique. COMPARISON:  MRI head November 15, 2023. FINDINGS: CT HEAD FINDINGS Brain: Acute intraparenchymal hemorrhage in the left frontal lobe measuring 2.6 cm with overlying extra-axial extension. Small volume of multifocal subarachnoid hemorrhage bilaterally along the right frontal convexity, left frontal convexity and layering within the left sylvian fissure. No midline shift. No hydrocephalus. Remote right occipital infarct. Vascular: Calcific atherosclerosis.  No dense vessel.  Skull: No acute fracture. Sinuses/Orbits: Clear sinuses.  No acute orbital findings. Other: No mastoid effusions. CT CERVICAL SPINE FINDINGS Alignment: No substantial sagittal subluxation. Skull base and vertebrae: No acute fracture. Soft tissues and spinal canal: No prevertebral fluid or swelling. No visible canal hematoma. Disc levels:  Moderate multilevel degenerative change. Upper chest: Visualized lung apices are clear. IMPRESSION: 1. Acute intraparenchymal hemorrhage in left frontal lobe with multifocal bilateral small volume subarachnoid hemorrhage. No midline shift. 2. No acute fracture or traumatic malalignment in the cervical spine. Findings discussed with Dr. Clayborne Dana via telephone at 2:19 a.m. Electronically Signed   By: Feliberto Harts M.D.   On: 11/30/2023 02:21   DG Chest Portable 1 View Result Date: 11/30/2023 CLINICAL DATA:  Fall EXAM: PORTABLE CHEST 1 VIEW COMPARISON:  03/05/2012 FINDINGS: There is asymmetric airspace infiltrate within the right mid lung zone, possibly infectious or posttraumatic given the history of recent trauma. No pneumothorax or pleural effusion. Cardiac size within normal limits. Pulmonary vascularity is normal. No acute bone abnormality. IMPRESSION: 1. Asymmetric airspace infiltrate within the right mid lung zone, possibly infectious or posttraumatic given the history of recent trauma. Electronically Signed   By: Helyn Numbers M.D.   On: 11/30/2023 01:18   DG Pelvis Portable Result Date: 11/30/2023 CLINICAL DATA:  Fall EXAM: PORTABLE PELVIS 1-2 VIEWS COMPARISON:  None Available. FINDINGS: Left total hip arthroplasty has been performed. No acute fracture or dislocation. Mild right hip degenerative arthritis. Vascular calcifications noted. Soft tissues are otherwise unremarkable. IMPRESSION: 1. Left total hip arthroplasty. No acute fracture or dislocation. Electronically Signed   By: Helyn Numbers M.D.   On: 11/30/2023 01:17   DG HIP UNILAT W OR W/O PELVIS 2-3 VIEWS  LEFT Result Date: 11/16/2023 CLINICAL DATA:  [id number] History of left hip hemiarthroplasty [id number] EXAM: DG HIP (WITH OR WITHOUT PELVIS) 2-3V LEFT COMPARISON:  11/15/2023 FINDINGS: Interval left hip arthroplasty, components projecting in expected location. No fracture or dislocation. Surgical clips project over the pubic bones. Patchy iliofemoral arterial calcifications. IMPRESSION: Left hip arthroplasty, without apparent complication. Electronically Signed   By: Corlis Leak M.D.   On: 11/16/2023 16:11   MR BRAIN WO CONTRAST Result Date: 11/15/2023 CLINICAL DATA:  Abnl CT Head, evaluate for pontine infarct EXAM: MRI HEAD WITHOUT CONTRAST TECHNIQUE: Multiplanar, multiecho pulse sequences of the brain and surrounding structures were obtained without  intravenous contrast. COMPARISON:  CT head from today. FINDINGS: Brain: No acute infarction, hemorrhage, hydrocephalus, extra-axial collection or mass lesion. Remote right PCA territory infarct. Moderate T2/FLAIR hyperintensities in the white matter, compatible with chronic microvascular ischemic disease. Vascular: Major arterial flow voids are maintained at the skull base. Skull and upper cervical spine: Normal marrow signal. Sinuses/Orbits: Negative. Other: No sizable mastoid effusions. IMPRESSION: 1. No evidence of acute intracranial abnormality. 2. Remote right PCA territory infarct. Electronically Signed   By: Feliberto Harts M.D.   On: 11/15/2023 22:38   CT Head Wo Contrast Result Date: 11/15/2023 CLINICAL DATA:  Altered mental status, fall EXAM: CT HEAD WITHOUT CONTRAST TECHNIQUE: Contiguous axial images were obtained from the base of the skull through the vertex without intravenous contrast. RADIATION DOSE REDUCTION: This exam was performed according to the departmental dose-optimization program which includes automated exposure control, adjustment of the mA and/or kV according to patient size and/or use of iterative reconstruction technique. COMPARISON:  None  Available. FINDINGS: Brain: No acute territorial infarction, hemorrhage or intracranial mass. 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 Atrophy and advanced chronic small vessel ischemic changes of the white matter. Hypodensity at the pons, series 2, image 11 and sagittal series 6 image 26. Ventricles are nonenlarged Vascular: No hyperdense vessels. Carotid and vertebral calcifications. Skull: Normal. Negative for fracture or focal lesion. Sinuses/Orbits: No acute finding. Other: None IMPRESSION: 1. Negative for acute intracranial hemorrhage or mass. 2. Hypodensity at the pons, though some of this may be related to artifact, the finding is rounded with some sparing of peripheral tissue and acute or subacute pontine infarct cannot be excluded. MRI is recommended for further evaluation. 3. Atrophy and chronic small vessel ischemic changes of the white matter Electronically Signed   By: Jasmine Pang M.D.   On: 11/15/2023 18:51   DG Ankle Complete Left Result Date: 11/15/2023 CLINICAL DATA:  Fall with ankle pain EXAM: LEFT ANKLE COMPLETE - 3+ VIEW COMPARISON:  01/30/2012 FINDINGS: Lateral plate and multiple fixating screws with additional medial malleolar fixating screws. Chronic postsurgical, posttraumatic and post infectious changes involving the distal tibia, distal fibula and tibiotalar joint. Fused appearance across the tibiotalar joint and probable subtalar fusion as well. No definite acute fracture is seen. IMPRESSION: Chronic postsurgical, posttraumatic and post infectious changes involving the distal tibia, distal fibula and tibiotalar joint. No definite acute osseous abnormality. Electronically Signed   By: Jasmine Pang M.D.   On: 11/15/2023 18:09   DG Foot Complete Left Result Date: 11/15/2023 CLINICAL DATA:  Larey Seat 2 weeks ago with left foot pain. EXAM: LEFT FOOT - COMPLETE 3+ VIEW COMPARISON:  Left ankle 01/30/2012 FINDINGS: Diffuse bone demineralization. Degenerative  changes in the interphalangeal joints, metatarsal-phalangeal, tarsometatarsal, and interphalangeal joints. Postoperative fusion of the ankle joint with plate and screw fixation. No evidence of acute fracture or dislocation. Soft tissues are unremarkable. IMPRESSION: 1. No acute bony abnormalities. 2. Degenerative changes in the left foot. 3. Postoperative changes with surgical fusion of the left ankle including plate and screw fixation Electronically Signed   By: Burman Nieves M.D.   On: 11/15/2023 18:03   DG Hip Unilat W or Wo Pelvis 2-3 Views Left Result Date: 11/15/2023 CLINICAL DATA:  Fall 2 weeks ago. Dizziness with fall forward. Left foot pain. EXAM: DG HIP (WITH OR WITHOUT PELVIS) 2-3V LEFT COMPARISON:  None Available. FINDINGS: Transverse subcapital fracture of the left femoral neck with varus angulation of the fracture fragments. No inter trochanteric involvement is identified.  No dislocation at the hip joint. Pelvis appears intact. SI joints and symphysis pubis are not displaced. No destructive or expansile bone lesions. Degenerative changes in the lower lumbar spine and both hips. Vascular calcifications. IMPRESSION: Acute transverse fracture of the left femoral neck with varus angulation of the fracture fragments. Degenerative changes. Electronically Signed   By: Burman Nieves M.D.   On: 11/15/2023 18:02     PHYSICAL EXAM  Temp:  [98.2 F (36.8 C)-100.5 F (38.1 C)] 99.4 F (37.4 C) (02/22 1200) Pulse Rate:  [72-119] 81 (02/22 1600) Resp:  [16-26] 19 (02/22 1600) BP: (97-141)/(56-73) 116/61 (02/22 1600) SpO2:  [94 %-100 %] 100 % (02/22 1600) Weight:  [60.1 kg] 60.1 kg (02/22 0500)  General - well nourished, well developed, in no apparent distress.     Ophthalmologic - fundi not visualized due to noncooperation.     Cardiovascular - regular rhythm and rate   Neuro - eyes not open on voice, but resisted forced opening, nonverbal and not following commands. Not able to check  eye gaze or pupil today due to resisting forced eye opening. Mild right facial droop. Tongue protrusion or mouth open not cooperative. Withdraw to pain on all extremities but more so on the left. Sensation, coordination and gait not tested.   ASSESSMENT/PLAN William Todd is a 73 y.o. male with PMH of DM, HTN, HLD, prostate cancer, left foot fracture with prosthesis, dementia and recent fall with acute transverse fracture of the left femoral neck s/p hemiarthroplasty on 2/2. Post op delirium/confusion but MRI at that time no acute finding, no stroke or hemorrhage. No sign of CAA. He was put on ASA 81 bid for 4 weeks for DVT prophylaxis. He was discharged to SNF on 11/20/23. Readmitted on 2/15 with fall at SNF and left eyebrow laceration. CT head showed left frontal small ICH with bilateral small SAH, concerning for traumatic ICH with contusion. However repeat CT 2/16 showed significant enlarged hematoma on the left frontal area with multi-lobe configuration and IVH and SAH but no hydro with 5mm MLS.     ICH:  left frontal large ICH, etiology unclear, traumatic ICH vs. Spontaneous ICH. Less likely for hemorrhagic infarct CT head 2/16 left frontal small ICH with bilateral small SAH, concerning for traumatic ICH with contusion.  Repeat CT 2/16 showed significant enlarged hematoma on the left frontal area with multi-lobe configuration and IVH and SAH but no hydro with 5mm MLS.    Repeat CT 2/17 Similar appearance of intraparenchymal hematoma in the left frontal lobe with associated mass effect and 5 mm rightward midline shift. CTA head and neck - no spot sign. 60% R ICA bulb, b/l distal VA mod to severe stenosis,  mod left VA origin stenosis and mild to mod b/l ICA siphon CT repeat 2/18 large left frontal ICH 108cc vs. 2/17 105cc but with MLS of 7-8 mm.  MRI with and without unchanged size of large left frontal intraparenchymal hematoma with 4 mm of anterior rightward midline shift. Blood layering  within both lateral ventricle. Subarachnoid blood over both hemisphere.  CT repeat 2/20 stable hematoma and MLS 2D Echo  EF 60-65% EEG left frontal sharp waves,  moderate diffuse encephalopathy, no seizure LDL 57 HgbA1c 6.1 SCDs for VTE prophylaxis aspirin 81 mg daily bid prior to admission, now on No antithrombotic.  Keppra 500 -> 750 mg bid Ongoing aggressive stroke risk factor management Therapy recommendations:  SNF Disposition:  pending  Cerebral edema Repeat CT 2/17 5 mm  rightward midline shift. CT repeat 2/18 MLS of 7-8 mm.  CT repeat 2/20 stable hematoma and midline shift MRI Brain 2/22, MLS of 4 mm Na [id number] Put on 3% saline @ 50-> NS @ 50->25. 3% saline Held on 2/22 Bolus with 23.4% x 1 Na monitoring with goal 150-155  Diabetes HgbA1c 6.1 goal < 7.0 Controlled CBG monitoring SSI DM education and close PCP follow up  Hypertension Stable on the high end On norvasc 10 and metoprolol 50 bid Off IV metoprolol BP goal < 160 Long term BP goal normotensive  Hyperlipidemia Home meds:  lipitor 40  LDL 57, goal < 70 Consider to resume lipitor on discharge  Fever Tmax 100.8 CXR 2/21 No active disease and UA pending Close monitoring  AKI Creatinine 1.29--1.17--1.49--1.32--1.37 On IV fluid and tube feeding Monitoring BMP  Dysphagia Did not pass swallow Currently n.p.o. cortrak placed On tube feeding  Other Stroke Risk Factors Advanced age  Other Active Problems Anemia, hemoglobin 8.7--8.0--8.6--7.8--8.2, CBC monitoring Wife denies hx of dementia and stated that pt was functional before the fall earlier this month Recent fracture - fall with acute transverse fracture of the left femoral neck with varus angulation of the fracture fragments. Underwent hemiarthroplasty on 2/2.   Hospital day # 6  This patient is critically ill due to large frontal ICH, cerebral edema and at significant risk of neurological worsening, death form  brain herniation, seizure. This patient's care requires constant monitoring of vital signs, hemodynamics, respiratory and cardiac monitoring, review of multiple databases, neurological assessment, discussion with family, other specialists and medical decision making of high complexity. I spent 35 minutes of neurocritical care time in the care of this patient.  Discussed with CCM attending D. Agarwala.    Windell Norfolk, MD Neurology 12/06/2023 4:56 PM    To contact Stroke Continuity provider, please refer to [email or web address]. After hours, contact General Neurology

## 2023-12-06 NOTE — Plan of Care (Signed)
  Problem: Safety: Goal: Non-violent Restraint(s) Outcome: Not Progressing   Problem: Nutrition: Goal: Risk of aspiration will decrease Outcome: Not Progressing

## 2023-12-07 DIAGNOSIS — I609 Nontraumatic subarachnoid hemorrhage, unspecified: Secondary | ICD-10-CM | POA: Diagnosis not present

## 2023-12-07 DIAGNOSIS — E119 Type 2 diabetes mellitus without complications: Secondary | ICD-10-CM | POA: Diagnosis not present

## 2023-12-07 DIAGNOSIS — N1832 Chronic kidney disease, stage 3b: Secondary | ICD-10-CM | POA: Diagnosis not present

## 2023-12-07 DIAGNOSIS — I10 Essential (primary) hypertension: Secondary | ICD-10-CM | POA: Diagnosis not present

## 2023-12-07 LAB — BASIC METABOLIC PANEL
Anion gap: 10 (ref 5–15)
BUN: 34 mg/dL — ABNORMAL HIGH (ref 8–23)
CO2: 27 mmol/L (ref 22–32)
Calcium: 9 mg/dL (ref 8.9–10.3)
Chloride: 118 mmol/L — ABNORMAL HIGH (ref 98–111)
Creatinine, Ser: 1.4 mg/dL — ABNORMAL HIGH (ref 0.61–1.24)
GFR, Estimated: 53 mL/min — ABNORMAL LOW (ref >60)
Glucose, Bld: 163 mg/dL — ABNORMAL HIGH (ref 70–99)
Potassium: 3.6 mmol/L (ref 3.5–5.1)
Sodium: 155 mmol/L — ABNORMAL HIGH (ref 135–145)

## 2023-12-07 LAB — GLUCOSE, CAPILLARY
Glucose-Capillary: 124 mg/dL — ABNORMAL HIGH (ref 70–99)
Glucose-Capillary: 149 mg/dL — ABNORMAL HIGH (ref 70–99)
Glucose-Capillary: 165 mg/dL — ABNORMAL HIGH (ref 70–99)
Glucose-Capillary: 167 mg/dL — ABNORMAL HIGH (ref 70–99)
Glucose-Capillary: 170 mg/dL — ABNORMAL HIGH (ref 70–99)
Glucose-Capillary: 233 mg/dL — ABNORMAL HIGH (ref 70–99)

## 2023-12-07 LAB — SODIUM
Sodium: 154 mmol/L — ABNORMAL HIGH (ref 135–145)
Sodium: 155 mmol/L — ABNORMAL HIGH (ref 135–145)

## 2023-12-07 MED ORDER — METOCLOPRAMIDE HCL 5 MG/5ML PO SOLN
10.0000 mg | Freq: Three times a day (TID) | ORAL | Status: DC
  Administered 2023-12-07 – 2024-01-01 (×88): 10 mg
  Filled 2023-12-07 (×103): qty 10

## 2023-12-07 MED ORDER — FREE WATER
200.0000 mL | Status: DC
  Administered 2023-12-07 – 2024-01-01 (×136): 200 mL

## 2023-12-07 NOTE — Progress Notes (Signed)
 STROKE TEAM PROGRESS NOTE   SUBJECTIVE (INTERVAL HISTORY) His RN is at the bedside. Pt no acute event overnight, neuro unchanged. No family at bedside, Na improved from 158 to 154-155.    OBJECTIVE Temp:  [98.4 F (36.9 C)-100.7 F (38.2 C)] 99.8 F (37.7 C) (02/23 1200) Pulse Rate:  [75-111] 95 (02/23 1000) Cardiac Rhythm: Sinus tachycardia (02/23 0000) Resp:  [14-34] 21 (02/23 1000) BP: (97-132)/(50-79) 131/63 (02/23 1000) SpO2:  [99 %-100 %] 100 % (02/23 1000) Weight:  [59.3 kg] 59.3 kg (02/23 0428)  Recent Labs  Lab 12/06/23 2000 12/06/23 2346 12/07/23 0350 12/07/23 0727 12/07/23 1153  GLUCAP 118* 225* 170* 124* 165*   Recent Labs  Lab 12/01/23 1610 12/02/23 0624 12/02/23 1444 12/03/23 0456 12/03/23 0929 12/04/23 0431 12/04/23 0828 12/05/23 0822 12/05/23 1555 12/06/23 0415 12/06/23 0748 12/06/23 1340 12/06/23 1946 12/07/23 0152 12/07/23 0848 12/07/23 1308  NA  --  134*   < > 147*   < > 151*   < > 154*   < > 158*   < > 154* 157* 154* 155* 155*  K  --  3.9  --  3.7  --  3.6  --  4.2  --  3.8  --   --   --   --  3.6  --   CL  --  97*  --  112*  --  117*  --  121*  --  118*  --   --   --   --  118*  --   CO2  --  24  --  22  --  27  --  27  --  27  --   --   --   --  27  --   GLUCOSE  --  215*  --  173*  --  362*  --  284*  --  142*  --   --   --   --  163*  --   BUN  --  19  --  16  --  18  --  25*  --  29*  --   --   --   --  34*  --   CREATININE  --  1.17  --  1.49*  --  1.32*  --  1.37*  --  1.47*  --   --   --   --  1.40*  --   CALCIUM  --  8.6*  --  9.2  --  8.7*  --  8.7*  --  9.5  --   --   --   --  9.0  --   MG 1.7 1.7  --  1.8  --   --   --  2.1  --   --   --   --   --   --   --   --   PHOS 3.7 3.4  --  3.0  --   --   --   --   --   --   --   --   --   --   --   --    < > = values in this interval not displayed.   No results for input(s): "AST", "ALT", "ALKPHOS", "BILITOT", "PROT", "ALBUMIN" in the last 168 hours.  Recent Labs  Lab 12/02/23 0624  12/03/23 0456 12/04/23 0431 12/05/23 0822 12/06/23 0415  WBC 8.9 7.7 6.8 9.1 9.4  HGB 8.0* 8.6* 7.8* 8.2* 8.4*  HCT 23.9*  26.1* 24.2* 25.9* 27.0*  MCV 90.9 92.2 93.4 95.9 95.1  PLT 164 156 145* 137* 137*   No results for input(s): "CKTOTAL", "CKMB", "CKMBINDEX", "TROPONINI" in the last 168 hours. No results for input(s): "LABPROT", "INR" in the last 72 hours.       Component Value Date/Time   CHOL 111 12/02/2023 0624   TRIG 63 12/02/2023 0624   HDL 41 12/02/2023 0624   CHOLHDL 2.7 12/02/2023 0624   VLDL 13 12/02/2023 0624   LDLCALC 57 12/02/2023 0624   Lab Results  Component Value Date   HGBA1C 6.1 (H) 12/02/2023     I have personally reviewed the radiological images below and agree with the radiology interpretations.  DG Abd Portable 1V Result Date: 12/06/2023 CLINICAL DATA:  Nasogastric tube placement. EXAM: PORTABLE ABDOMEN - 1 VIEW COMPARISON:  December 03, 2023. FINDINGS: Distal tip of feeding tube is seen in expected position of proximal stomach. IMPRESSION: Distal tip of feeding tube is seen in expected position of proximal stomach. Electronically Signed   By: Lupita Raider M.D.   On: 12/06/2023 09:19   DG CHEST PORT 1 VIEW Result Date: 12/05/2023 CLINICAL DATA:  Fever EXAM: PORTABLE CHEST 1 VIEW COMPARISON:  11/29/2023 FINDINGS: Enteric tube tip at the distal esophagus. Lung fields are clear. Cardiac size is normal. Aortic atherosclerosis. IMPRESSION: No active disease. Enteric tube tip at the distal esophagus, recommend advancement for more optimal positioning. Electronically Signed   By: Jasmine Pang M.D.   On: 12/05/2023 21:13   MR BRAIN W WO CONTRAST Result Date: 12/05/2023 CLINICAL DATA:  Hemorrhagic stroke EXAM: MRI HEAD WITHOUT AND WITH CONTRAST TECHNIQUE: Multiplanar, multiecho pulse sequences of the brain and surrounding structures were obtained without and with intravenous contrast. CONTRAST:  6mL GADAVIST GADOBUTROL 1 MMOL/ML IV SOLN COMPARISON:  Head CT  12/04/2023 FINDINGS: Brain: Unchanged size of large left frontal intraparenchymal hematoma. Blood layering within both lateral ventricles. Subarachnoid blood over both hemispheres. There is multifocal hyperintense T2-weighted signal within the white matter. Generalized volume loss. There is 4 mm of anterior rightward midline shift. The midline structures are normal. There is no abnormal contrast enhancement. Vascular: Normal flow voids. Skull and upper cervical spine: Normal calvarium and skull base. Visualized upper cervical spine and soft tissues are normal. Sinuses/Orbits:No paranasal sinus fluid levels or advanced mucosal thickening. No mastoid or middle ear effusion. Normal orbits. IMPRESSION: 1. Unchanged size of large left frontal intraparenchymal hematoma with 4 mm of anterior rightward midline shift. 2. Blood layering within both lateral ventricles. 3. Subarachnoid blood over both hemispheres. Electronically Signed   By: Deatra Robinson M.D.   On: 12/05/2023 19:24   CT HEAD WO CONTRAST ( ) Result Date: 12/04/2023 CLINICAL DATA:  73 year old male with multifocal intracranial hemorrhage after a fall. Subsequent encounter. EXAM: CT HEAD WITHOUT CONTRAST TECHNIQUE: Contiguous axial images were obtained from the base of the skull through the vertex without intravenous contrast. RADIATION DOSE REDUCTION: This exam was performed according to the departmental dose-optimization program which includes automated exposure control, adjustment of the mA and/or kV according to patient size and/or use of iterative reconstruction technique. COMPARISON:  12/02/2023 and earlier. FINDINGS: Brain: Left anterior frontal lobe intra-axial hemorrhage remains hyperdense and amorphous. Due to scan angulation the most direct comparison now is on sagittal images (series 6, image 39 today versus the same series and image 2 days ago), where hemorrhage size and configuration appear unchanged. Regional edema and mass effect also  unchanged. Scattered bilateral subarachnoid blood has not  significantly changed. Small volume layering intraventricular hemorrhage has not significantly changed. Stable ventricle size and configuration, no ventriculomegaly. Stable mild rightward midline shift limited to the anterior septum pellucidum. Basilar cisterns are stable. Stable gray-white matter differentiation throughout the brain. Left frontal lobe edema surrounding the hematoma and chronic right PCA territory infarct with encephalomalacia. Vascular: No suspicious intracranial vascular hyperdensity. Skull: Intact, stable. Incidental large maxillary torus exostoses incidentally noted. Sinuses/Orbits: Left nasoenteric tube now in place. Visualized paranasal sinuses and mastoids are stable and well aerated. Other: Stable orbit and scalp soft tissues. IMPRESSION: 1. Continued stability of large anterior left frontal lobar hemorrhage, associated edema. Relatively mild intracranial mass effect, scattered small volume bilateral SAH, and IVH remain stable. 2. No new intracranial abnormality. Chronic right PCA territory infarct. Electronically Signed   By: Odessa Fleming M.D.   On: 12/04/2023 06:37   EEG adult Result Date: 12/03/2023 Charlsie Quest, MD     12/03/2023  4:09 PM Patient Name: William Todd MRN: [id number] Epilepsy Attending: Charlsie Quest Referring Physician/Provider: Elmer Picker, NP Date: 12/03/2023 Duration: 23.45 mins Patient history: 73yo M with left frontal ICH. EEG to evaluate for seizure Level of alertness: Awake, asleep AEDs during EEG study: LEV Technical aspects: This EEG study was done with scalp electrodes positioned according to the 10-20 International system of electrode placement. Electrical activity was reviewed with band pass filter of 1-70Hz , sensitivity of 7 uV/mm, display speed of 71mm/sec with a 60Hz  notched filter applied as appropriate. EEG data were recorded continuously and digitally stored.  Video monitoring was  available and reviewed as appropriate. Description: No clear posterior dominant rhythm was seen. Sleep was characterized by sleep spindles (12 to 14 Hz), maximal frontocentral region. EEG showed continuous generalized and maximal left frontal 3 to 6 Hz theta-delta slowing. Sharp waves were noted in left frontal region,at times periodic at 1hz . Hyperventilation and photic stimulation were not performed.   ABNORMALITY - Sharp wave, left frontal region - Continuous slow, generalized and maximal left frontal region IMPRESSION: This study showed evidence of epileptogenicity and cortical dysfunction arising from left frontal region likely secondary to underlying ICH. Additionally there is moderate diffuse encephalopathy. No seizures were seen throughout the recording. If suspicion for ictal activity remains a concern, a prolonged study can be considered. Charlsie Quest   DG Abd Portable 1V Result Date: 12/03/2023 CLINICAL DATA:  73 year old male feeding tube placement. EXAM: PORTABLE ABDOMEN - 1 VIEW COMPARISON:  12/01/2023 and earlier. FINDINGS: Portable AP semi upright view at 1038 hours. Enteric feeding tube is in place within the proximal stomach, looped at the level of the gastric fundus. Negative lung bases. Nonobstructed bowel gas pattern. No acute osseous abnormality identified. IMPRESSION: Enteric feeding tube placed into the proximal stomach. Electronically Signed   By: Odessa Fleming M.D.   On: 12/03/2023 11:19   CT HEAD WO CONTRAST ( ) Result Date: 12/02/2023 CLINICAL DATA:  73 year old male with multifocal intracranial hemorrhage status post fall. Hemorrhage progression after presentation. EXAM: CT HEAD WITHOUT CONTRAST TECHNIQUE: Contiguous axial images were obtained from the base of the skull through the vertex without intravenous contrast. RADIATION DOSE REDUCTION: This exam was performed according to the departmental dose-optimization program which includes automated exposure control, adjustment of the  mA and/or kV according to patient size and/or use of iterative reconstruction technique. COMPARISON:  CTA head and neck this morning. Head CT yesterday and earlier. FINDINGS: Brain: Large volume mixed density but mostly hyperdense intra-axial hemorrhage in the anterior left frontal lobe  encompasses 78 by 44 x 63 mm (AP by transverse by CC) versus 79 x 45 x 61 mm yesterday when measured with the same technique. Volume estimated at 108 mL, versus 105 mL on 11/30/2023, and hemorrhage size and configuration appears visually stable. Superimposed scattered bilateral subarachnoid blood and a moderate volume of mostly now layering lateral intraventricular blood also appears stable. Rightward midline shift at the anterior septum pellucidum of 7-8 mm has increased from 5-6 mm on 11/30/2023. Ventricle size and configuration is stable. Stable basilar cistern patency. Superimposed chronic right PCA territory encephalomalacia and confluent bilateral cerebral white matter hypodensity. Vascular: Calcified atherosclerosis at the skull base. No suspicious intracranial vascular hyperdensity. Skull: Stable, intact. Sinuses/Orbits: Visualized paranasal sinuses and mastoids are stable and well aerated. Other: Stable, negative orbit and scalp soft tissues. IMPRESSION: 1. Large volume anterior frontal lobe intra-axial hemorrhage does not appear significantly changed in size or configuration since 11/30/2023. Using largest orthogonal dimensions, estimated blood volume is 108 mL now vs. 105 mL then. 2. Intracranial mass effect with mildly increased rightward midline shift since 11/30/2023, now 7-8 mm. Basilar cisterns remain patent. 3. Stable bilateral SAH and IVH. Stable ventricle size and configuration. 4. Chronic right PCA territory infarct. Nonspecific white matter changes. Electronically Signed   By: Odessa Fleming M.D.   On: 12/02/2023 11:47   CT ANGIO HEAD NECK W WO CM Result Date: 12/02/2023 CLINICAL DATA:  73 year old male with  multifocal intracranial hemorrhage status post fall. EXAM: CT ANGIOGRAPHY HEAD AND NECK TECHNIQUE: Multidetector CT imaging of the head and neck was performed using the standard protocol during bolus administration of intravenous contrast. Multiplanar CT image reconstructions and MIPs were obtained to evaluate the vascular anatomy. Carotid stenosis measurements (when applicable) are obtained utilizing NASCET criteria, using the distal internal carotid diameter as the denominator. RADIATION DOSE REDUCTION: This exam was performed according to the departmental dose-optimization program which includes automated exposure control, adjustment of the mA and/or kV according to patient size and/or use of iterative reconstruction technique. CONTRAST:  75mL OMNIPAQUE IOHEXOL 350 MG/ML SOLN COMPARISON:  Head CTs 12/01/2023 and earlier. Brain MRI 11/15/2023. FINDINGS: CTA NECK Skeleton: Cervical spine degeneration. 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 No acute osseous abnormality identified. Upper chest: Upper lungs are clear. Negative visible superior mediastinum. Other neck: Neck soft tissue spaces appear within normal limits. Aortic arch: Calcified aortic atherosclerosis.  3 vessel arch. Right carotid system: No significant brachiocephalic artery or or right CCA origin atherosclerosis. Soft and calcified plaque of the right CCA before the bifurcation without stenosis. Bulky calcified atherosclerosis of the proximal right ICA beginning at the origin but most pronounced at the bulb. Subsequent distal bulb stenosis is numerically estimated at 60 % with respect to the distal vessel (series 7, image 98 and series 9, image 155). Right ICA remains patent to the skull base. Left carotid system: Minimal left CCA atherosclerosis before the bifurcation. Soft and calcified plaque at the left ICA origin and more pronounced at the bulb. Up to 50 % stenosis with respect to the distal  vessel. Additional calcified plaque of the left ICA just below the skull base without stenosis. Vertebral arteries: Mild proximal right subclavian atherosclerosis without stenosis. Calcified plaque at the right vertebral artery origin with only mild stenosis on series 8, image 145. Right vertebral is patent to the skull base with no significant plaque or stenosis. Proximal left subclavian artery soft and calcified plaque without stenosis. Mostly soft plaque at the left vertebral artery origin  with up to moderate origin stenosis on series 8, image 135. Left vertebral remains patent and is fairly codominant in the neck. Mild left V3 segment calcified plaque with no additional significant stenosis to the skull base. CTA HEAD Posterior circulation: Bilateral V4 segment atherosclerosis. Bulky and severe right V4 calcified plaque and stenosis on series 5, image 145. The right vertebrobasilar junction remains patent. Contralateral moderate left V4 segment stenosis proximal to the left PICA origin which remains normal. Right PICA origin is patent. Patent left vertebrobasilar junction. Patent basilar artery without stenosis. Fetal type bilateral PCA origins. Patent SCA origins. Bilateral PCA branches are within normal limits. Anterior circulation: Both ICA siphons are patent. Left siphon cavernous segment calcified plaque is moderate with mild to moderate stenosis at the anterior genu. Normal left posterior communicating artery origin. Right ICA cavernous through supraclinoid segment calcified plaque is moderate with mild to moderate supraclinoid stenosis. Normal right posterior communicating artery origin. Patent carotid termini. Patent MCA origins. Dominant right and diminutive or absent left ACA A1 segments. Anterior communicating artery and bilateral ACA branches are within normal limits. Left MCA M1 segment and bifurcation are patent without stenosis. There is mild mass effect on the left MCA branches which otherwise  appear within normal limits. Right MCA M1 segment and trifurcation are patent without stenosis. Right MCA branches are within normal limits. No CTA spot sign or abnormal vascularity associated with the left anterior superior frontal lobe intra-axial hemorrhage. Other findings: Grossly stable intracranial hemorrhage, mass effect from the head CT yesterday. Venous sinuses: Early contrast timing. Superior sagittal sinus, torcula, straight sinus, vein of Galen and internal cerebral veins, dominant appearing right transverse and sigmoid venous sinuses appear to be enhancing and patent. Anatomic variants: Fetal type bilateral PCA origins. Dominant right ACA A1. Review of the MIP images confirms the above findings IMPRESSION: 1. Negative for CTA spot sign in association with acute intracranial hemorrhage. Mild mass effect on left MCA vasculature. Negative for intracranial aneurysm, vascular malformation, or large vessel occlusion. 2. Atherosclerosis in the head and neck notable for: - 60% stenosis of the Right ICA bulb. - Moderate To Severe bilateral distal vertebral artery stenosis. - Moderate Left Vertebral Artery origin stenosis. - mild to moderate bilateral ICA siphon stenosis. 3.  Aortic Atherosclerosis (ICD10-I70.0). Electronically Signed   By: Odessa Fleming M.D.   On: 12/02/2023 06:41   DG Abd Portable 1V Result Date: 12/01/2023 CLINICAL DATA:  NG placement. EXAM: PORTABLE ABDOMEN - 1 VIEW COMPARISON:  CT abdomen pelvis dated 07/01/2013. FINDINGS: Feeding tube with tip in the left upper abdomen likely in the region of the gastric fundus. Large amount of stool throughout the colon. IMPRESSION: Feeding tube with tip in the region of the gastric fundus. Electronically Signed   By: Elgie Collard M.D.   On: 12/01/2023 18:55   CT HEAD WO CONTRAST ( ) Result Date: 12/01/2023 CLINICAL DATA:  Follow-up of intracranial hemorrhage. EXAM: CT HEAD WITHOUT CONTRAST TECHNIQUE: Contiguous axial images were obtained from the  base of the skull through the vertex without intravenous contrast. RADIATION DOSE REDUCTION: This exam was performed according to the departmental dose-optimization program which includes automated exposure control, adjustment of the mA and/or kV according to patient size and/or use of iterative reconstruction technique. COMPARISON:  None Available. FINDINGS: Brain: Redemonstrated intraparenchymal hematoma in the anterior left frontal lobe which measures 7.9 x 4.9 x 3.6 cm, previously measuring 7.8 x 5.2 x 3.4 cm when remeasured in a similar manner. Similar surrounding edema and associated mass  effect. Approximately 5 mm rightward midline shift anteriorly similar to prior. Extension of hemorrhage into the adjacent left frontal horn with similar appearance of blood products in the left lateral ventricle. Increased blood products within the atrium and occipital horn of the right lateral ventricle. Slightly decreased blood products in the third ventricle. Small focus of subdural hemorrhage along the anterior falx. Scattered areas of subarachnoid hemorrhage along the right frontal lobe and right sylvian fissure again noted. Remote infarct in the right occipital lobe. Vascular: No hyperdense vessel or unexpected calcification. Skull: Normal. Negative for fracture or focal lesion. Sinuses/Orbits: No acute finding. Other: Mastoid air cells are clear. IMPRESSION: Similar appearance of intraparenchymal hematoma in the left frontal lobe with associated mass effect and 5 mm rightward midline shift. Additional small multi compartment intracranial hemorrhage as above. Slightly increased blood products within the atrium and occipital horn of the right lateral ventricle. Electronically Signed   By: Emily Filbert M.D.   On: 12/01/2023 13:34   ECHOCARDIOGRAM COMPLETE Result Date: 11/30/2023    ECHOCARDIOGRAM REPORT   Patient Name:   William Todd Date of Exam: 11/30/2023 Medical Rec #:  [id number]        Height:       72.0 in  Accession #:    [id number]       Weight:       147.0 lb Date of Birth:  21-Nov-1950         BSA:          1.869 m Patient Age:    72 years         BP:           129/68 mmHg Patient Gender: M                HR:           77 bpm. Exam Location:  Inpatient Procedure: 2D Echo (Both Spectral and Color Flow Doppler were utilized during            procedure). Indications:    intracerebral hemorrhage  History:        Patient has prior history of Echocardiogram examinations, most                 recent 01/02/2013. Risk Factors:Hypertension, Diabetes and                 Dyslipidemia.  Sonographer:    Delcie Roch RDCS Referring Phys: [phone] WHITNEY D HARRIS IMPRESSIONS  1. Left ventricular ejection fraction, by estimation, is 60 to 65%. The left ventricle has normal function. The left ventricle has no regional wall motion abnormalities. Left ventricular diastolic parameters were normal.  2. Right ventricular systolic function is normal. The right ventricular size is normal. There is mildly elevated pulmonary artery systolic pressure.  3. The mitral valve is degenerative. Mild mitral valve regurgitation. No evidence of mitral stenosis.  4. The aortic valve is tricuspid. There is mild calcification of the aortic valve. Aortic valve regurgitation is not visualized. Aortic valve sclerosis is present, with no evidence of aortic valve stenosis.  5. The inferior vena cava is normal in size with greater than 50% respiratory variability, suggesting right atrial pressure of 3 mmHg. FINDINGS  Left Ventricle: Left ventricular ejection fraction, by estimation, is 60 to 65%. The left ventricle has normal function. The left ventricle has no regional wall motion abnormalities. Strain imaging was not performed. The left ventricular internal cavity  size was normal in size. There is no  left ventricular hypertrophy. Left ventricular diastolic parameters were normal. Right Ventricle: The right ventricular size is normal. No increase in right  ventricular wall thickness. Right ventricular systolic function is normal. There is mildly elevated pulmonary artery systolic pressure. The tricuspid regurgitant velocity is 2.89  m/s, and with an assumed right atrial pressure of 3 mmHg, the estimated right ventricular systolic pressure is 36.4 mmHg. Left Atrium: Left atrial size was normal in size. Right Atrium: Right atrial size was normal in size. Pericardium: There is no evidence of pericardial effusion. Mitral Valve: The mitral valve is degenerative in appearance. There is mild calcification of the anterior mitral valve leaflet(s). Mild mitral valve regurgitation. No evidence of mitral valve stenosis. Tricuspid Valve: The tricuspid valve is grossly normal. Tricuspid valve regurgitation is mild . No evidence of tricuspid stenosis. Aortic Valve: The aortic valve is tricuspid. There is mild calcification of the aortic valve. Aortic valve regurgitation is not visualized. Aortic valve sclerosis is present, with no evidence of aortic valve stenosis. Pulmonic Valve: The pulmonic valve was grossly normal. Pulmonic valve regurgitation is not visualized. No evidence of pulmonic stenosis. Aorta: The aortic root and ascending aorta are structurally normal, with no evidence of dilitation. Venous: The inferior vena cava is normal in size with greater than 50% respiratory variability, suggesting right atrial pressure of 3 mmHg. IAS/Shunts: The atrial septum is grossly normal. Additional Comments: 3D imaging was not performed.  LEFT VENTRICLE PLAX 2D LVIDd:         3.20 cm   Diastology LVIDs:         1.70 cm   LV e' medial:    8.16 cm/s LV PW:         1.20 cm   LV E/e' medial:  10.7 LV IVS:        1.00 cm   LV e' lateral:   11.20 cm/s LVOT diam:     2.40 cm   LV E/e' lateral: 7.8 LV SV:         115 LV SV Index:   61 LVOT Area:     4.52 cm  RIGHT VENTRICLE             IVC RV Basal diam:  2.80 cm     IVC diam: 1.70 cm RV S prime:     12.90 cm/s TAPSE (M-mode): 2.2 cm LEFT  ATRIUM             Index        RIGHT ATRIUM           Index LA diam:        3.30 cm 1.77 cm/m   RA Area:     13.00 cm LA Vol (A2C):   45.6 ml 24.39 ml/m  RA Volume:   29.50 ml  15.78 ml/m LA Vol (A4C):   39.1 ml 20.92 ml/m LA Biplane Vol: 43.8 ml 23.43 ml/m  AORTIC VALVE LVOT Vmax:   125.00 cm/s LVOT Vmean:  83.200 cm/s LVOT VTI:    0.254 m  AORTA Ao Root diam: 3.30 cm Ao Asc diam:  3.20 cm MITRAL VALVE               TRICUSPID VALVE MV Area (PHT): 3.37 cm    TR Peak grad:   33.4 mmHg MV Decel Time: 225 msec    TR Vmax:        289.00 cm/s MR Peak grad: 113.2 mmHg MR Mean grad: 73.0 mmHg    SHUNTS MR Vmax:  532.00 cm/s  Systemic VTI:  0.25 m MR Vmean:     403.0 cm/s   Systemic Diam: 2.40 cm MV E velocity: 87.30 cm/s MV A velocity: 70.40 cm/s MV E/A ratio:  1.24 Lennie Odor MD Electronically signed by Lennie Odor MD Signature Date/Time: 11/30/2023/8:57:24 PM    Final    CT Head Wo Contrast Result Date: 11/30/2023 CLINICAL DATA:  Follow-up head trauma EXAM: CT HEAD WITHOUT CONTRAST TECHNIQUE: Contiguous axial images were obtained from the base of the skull through the vertex without intravenous contrast. RADIATION DOSE REDUCTION: This exam was performed according to the departmental dose-optimization program which includes automated exposure control, adjustment of the mA and/or kV according to patient size and/or use of iterative reconstruction technique. COMPARISON:  Head CT from the same day at 2 a.m. FINDINGS: Brain: Growing hematoma in the anterior left frontal lobe, hemorrhagic area measuring 7.3 x 5 x 3.4 cm. The hemorrhage has now decompressed into the left lateral ventricle with tracking to the level of the fourth ventricle. No hydrocephalus. Patchy subarachnoid hemorrhage along the frontal lobes and sylvian fissures which appears similar. The hemorrhage and edema causes progressive swelling with anterior midline shift measuring 5 mm. Chronic right occipital infarct. Low-density in the cerebral  white matter from chronic small vessel ischemia. Vascular: No hyperdense vessel or unexpected calcification. Skull: No acute finding Sinuses/Orbits: No evidence of injury Critical Value/emergent results were called by telephone at the time of interpretation on 11/30/2023 at 8:45 am to provider Ogbata, who verbally acknowledged these results. IMPRESSION: Progressive left frontal hematoma now measuring 60 cc with new intraventricular extension reaching the fourth ventricle. Patchy bilateral subarachnoid hemorrhage appears similar to prior. The growing hematoma and edema causes anterior midline shift by 5 mm. No hydrocephalus. Electronically Signed   By: Tiburcio Pea M.D.   On: 11/30/2023 08:46   CT Head Wo Contrast Result Date: 11/30/2023 CLINICAL DATA:  Head trauma, minor (Age >= 65y); Neck trauma (Age >= 65y) EXAM: CT HEAD WITHOUT CONTRAST CT CERVICAL SPINE WITHOUT CONTRAST TECHNIQUE: Multidetector CT imaging of the head and cervical spine was performed following the standard protocol without intravenous contrast. Multiplanar CT image reconstructions of the cervical spine were also generated. RADIATION DOSE REDUCTION: This exam was performed according to the departmental dose-optimization program which includes automated exposure control, adjustment of the mA and/or kV according to patient size and/or use of iterative reconstruction technique. COMPARISON:  MRI head November 15, 2023. FINDINGS: CT HEAD FINDINGS Brain: Acute intraparenchymal hemorrhage in the left frontal lobe measuring 2.6 cm with overlying extra-axial extension. Small volume of multifocal subarachnoid hemorrhage bilaterally along the right frontal convexity, left frontal convexity and layering within the left sylvian fissure. No midline shift. No hydrocephalus. Remote right occipital infarct. Vascular: Calcific atherosclerosis.  No dense vessel. Skull: No acute fracture. Sinuses/Orbits: Clear sinuses.  No acute orbital findings. Other: No  mastoid effusions. CT CERVICAL SPINE FINDINGS Alignment: No substantial sagittal subluxation. Skull base and vertebrae: No acute fracture. Soft tissues and spinal canal: No prevertebral fluid or swelling. No visible canal hematoma. Disc levels:  Moderate multilevel degenerative change. Upper chest: Visualized lung apices are clear. IMPRESSION: 1. Acute intraparenchymal hemorrhage in left frontal lobe with multifocal bilateral small volume subarachnoid hemorrhage. No midline shift. 2. No acute fracture or traumatic malalignment in the cervical spine. Findings discussed with Dr. Clayborne Dana via telephone at 2:19 a.m. Electronically Signed   By: Feliberto Harts M.D.   On: 11/30/2023 02:21   CT Cervical Spine Wo Contrast Result  Date: 11/30/2023 CLINICAL DATA:  Head trauma, minor (Age >= 65y); Neck trauma (Age >= 65y) EXAM: CT HEAD WITHOUT CONTRAST CT CERVICAL SPINE WITHOUT CONTRAST TECHNIQUE: Multidetector CT imaging of the head and cervical spine was performed following the standard protocol without intravenous contrast. Multiplanar CT image reconstructions of the cervical spine were also generated. RADIATION DOSE REDUCTION: This exam was performed according to the departmental dose-optimization program which includes automated exposure control, adjustment of the mA and/or kV according to patient size and/or use of iterative reconstruction technique. COMPARISON:  MRI head November 15, 2023. FINDINGS: CT HEAD FINDINGS Brain: Acute intraparenchymal hemorrhage in the left frontal lobe measuring 2.6 cm with overlying extra-axial extension. Small volume of multifocal subarachnoid hemorrhage bilaterally along the right frontal convexity, left frontal convexity and layering within the left sylvian fissure. No midline shift. No hydrocephalus. Remote right occipital infarct. Vascular: Calcific atherosclerosis.  No dense vessel. Skull: No acute fracture. Sinuses/Orbits: Clear sinuses.  No acute orbital findings. Other: No mastoid  effusions. CT CERVICAL SPINE FINDINGS Alignment: No substantial sagittal subluxation. Skull base and vertebrae: No acute fracture. Soft tissues and spinal canal: No prevertebral fluid or swelling. No visible canal hematoma. Disc levels:  Moderate multilevel degenerative change. Upper chest: Visualized lung apices are clear. IMPRESSION: 1. Acute intraparenchymal hemorrhage in left frontal lobe with multifocal bilateral small volume subarachnoid hemorrhage. No midline shift. 2. No acute fracture or traumatic malalignment in the cervical spine. Findings discussed with Dr. Clayborne Dana via telephone at 2:19 a.m. Electronically Signed   By: Feliberto Harts M.D.   On: 11/30/2023 02:21   DG Chest Portable 1 View Result Date: 11/30/2023 CLINICAL DATA:  Fall EXAM: PORTABLE CHEST 1 VIEW COMPARISON:  03/05/2012 FINDINGS: There is asymmetric airspace infiltrate within the right mid lung zone, possibly infectious or posttraumatic given the history of recent trauma. No pneumothorax or pleural effusion. Cardiac size within normal limits. Pulmonary vascularity is normal. No acute bone abnormality. IMPRESSION: 1. Asymmetric airspace infiltrate within the right mid lung zone, possibly infectious or posttraumatic given the history of recent trauma. Electronically Signed   By: Helyn Numbers M.D.   On: 11/30/2023 01:18   DG Pelvis Portable Result Date: 11/30/2023 CLINICAL DATA:  Fall EXAM: PORTABLE PELVIS 1-2 VIEWS COMPARISON:  None Available. FINDINGS: Left total hip arthroplasty has been performed. No acute fracture or dislocation. Mild right hip degenerative arthritis. Vascular calcifications noted. Soft tissues are otherwise unremarkable. IMPRESSION: 1. Left total hip arthroplasty. No acute fracture or dislocation. Electronically Signed   By: Helyn Numbers M.D.   On: 11/30/2023 01:17   DG HIP UNILAT W OR W/O PELVIS 2-3 VIEWS LEFT Result Date: 11/16/2023 CLINICAL DATA:  [id number] History of left hip hemiarthroplasty [id number] EXAM:  DG HIP (WITH OR WITHOUT PELVIS) 2-3V LEFT COMPARISON:  11/15/2023 FINDINGS: Interval left hip arthroplasty, components projecting in expected location. No fracture or dislocation. Surgical clips project over the pubic bones. Patchy iliofemoral arterial calcifications. IMPRESSION: Left hip arthroplasty, without apparent complication. Electronically Signed   By: Corlis Leak M.D.   On: 11/16/2023 16:11   MR BRAIN WO CONTRAST Result Date: 11/15/2023 CLINICAL DATA:  Abnl CT Head, evaluate for pontine infarct EXAM: MRI HEAD WITHOUT CONTRAST TECHNIQUE: Multiplanar, multiecho pulse sequences of the brain and surrounding structures were obtained without intravenous contrast. COMPARISON:  CT head from today. FINDINGS: Brain: No acute infarction, hemorrhage, hydrocephalus, extra-axial collection or mass lesion. Remote right PCA territory infarct. Moderate T2/FLAIR hyperintensities in the white matter, compatible with chronic microvascular ischemic disease. Vascular:  Major arterial flow voids are maintained at the skull base. Skull and upper cervical spine: Normal marrow signal. Sinuses/Orbits: Negative. Other: No sizable mastoid effusions. IMPRESSION: 1. No evidence of acute intracranial abnormality. 2. Remote right PCA territory infarct. Electronically Signed   By: Feliberto Harts M.D.   On: 11/15/2023 22:38   CT Head Wo Contrast Result Date: 11/15/2023 CLINICAL DATA:  Altered mental status, fall EXAM: CT HEAD WITHOUT CONTRAST TECHNIQUE: Contiguous axial images were obtained from the base of the skull through the vertex without intravenous contrast. RADIATION DOSE REDUCTION: This exam was performed according to the departmental dose-optimization program which includes automated exposure control, adjustment of the mA and/or kV according to patient size and/or use of iterative reconstruction technique. COMPARISON:  None Available. FINDINGS: Brain: No acute territorial infarction, hemorrhage or intracranial mass.  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 Atrophy and advanced chronic small vessel ischemic changes of the white matter. Hypodensity at the pons, series 2, image 11 and sagittal series 6 image 26. Ventricles are nonenlarged Vascular: No hyperdense vessels. Carotid and vertebral calcifications. Skull: Normal. Negative for fracture or focal lesion. Sinuses/Orbits: No acute finding. Other: None IMPRESSION: 1. Negative for acute intracranial hemorrhage or mass. 2. Hypodensity at the pons, though some of this may be related to artifact, the finding is rounded with some sparing of peripheral tissue and acute or subacute pontine infarct cannot be excluded. MRI is recommended for further evaluation. 3. Atrophy and chronic small vessel ischemic changes of the white matter Electronically Signed   By: Jasmine Pang M.D.   On: 11/15/2023 18:51   DG Ankle Complete Left Result Date: 11/15/2023 CLINICAL DATA:  Fall with ankle pain EXAM: LEFT ANKLE COMPLETE - 3+ VIEW COMPARISON:  01/30/2012 FINDINGS: Lateral plate and multiple fixating screws with additional medial malleolar fixating screws. Chronic postsurgical, posttraumatic and post infectious changes involving the distal tibia, distal fibula and tibiotalar joint. Fused appearance across the tibiotalar joint and probable subtalar fusion as well. No definite acute fracture is seen. IMPRESSION: Chronic postsurgical, posttraumatic and post infectious changes involving the distal tibia, distal fibula and tibiotalar joint. No definite acute osseous abnormality. Electronically Signed   By: Jasmine Pang M.D.   On: 11/15/2023 18:09   DG Foot Complete Left Result Date: 11/15/2023 CLINICAL DATA:  Larey Seat 2 weeks ago with left foot pain. EXAM: LEFT FOOT - COMPLETE 3+ VIEW COMPARISON:  Left ankle 01/30/2012 FINDINGS: Diffuse bone demineralization. Degenerative changes in the interphalangeal joints, metatarsal-phalangeal, tarsometatarsal, and  interphalangeal joints. Postoperative fusion of the ankle joint with plate and screw fixation. No evidence of acute fracture or dislocation. Soft tissues are unremarkable. IMPRESSION: 1. No acute bony abnormalities. 2. Degenerative changes in the left foot. 3. Postoperative changes with surgical fusion of the left ankle including plate and screw fixation Electronically Signed   By: Burman Nieves M.D.   On: 11/15/2023 18:03   DG Hip Unilat W or Wo Pelvis 2-3 Views Left Result Date: 11/15/2023 CLINICAL DATA:  Fall 2 weeks ago. Dizziness with fall forward. Left foot pain. EXAM: DG HIP (WITH OR WITHOUT PELVIS) 2-3V LEFT COMPARISON:  None Available. FINDINGS: Transverse subcapital fracture of the left femoral neck with varus angulation of the fracture fragments. No inter trochanteric involvement is identified. No dislocation at the hip joint. Pelvis appears intact. SI joints and symphysis pubis are not displaced. No destructive or expansile bone lesions. Degenerative changes in the lower lumbar spine and both hips. Vascular calcifications. IMPRESSION: Acute transverse fracture  of the left femoral neck with varus angulation of the fracture fragments. Degenerative changes. Electronically Signed   By: Burman Nieves M.D.   On: 11/15/2023 18:02     PHYSICAL EXAM  Temp:  [98.4 F (36.9 C)-100.7 F (38.2 C)] 99.8 F (37.7 C) (02/23 1200) Pulse Rate:  [75-111] 95 (02/23 1000) Resp:  [14-34] 21 (02/23 1000) BP: (97-132)/(50-79) 131/63 (02/23 1000) SpO2:  [99 %-100 %] 100 % (02/23 1000) Weight:  [59.3 kg] 59.3 kg (02/23 0428)  General - well nourished, well developed, in no apparent distress.     Ophthalmologic - fundi not visualized due to noncooperation.     Cardiovascular - regular rhythm and rate   Neuro - eyes not open on voice, but resisted forced opening, nonverbal and not following commands. Not able to check eye gaze or pupil today due to resisting forced eye opening. Mild right facial droop.  Tongue protrusion or mouth open not cooperative. Withdraw to pain on all extremities but more so on the left. Sensation, coordination and gait not tested. He does have retrains and mittens    ASSESSMENT/PLAN William Todd is a 73 y.o. male with PMH of DM, HTN, HLD, prostate cancer, left foot fracture with prosthesis, dementia and recent fall with acute transverse fracture of the left femoral neck s/p hemiarthroplasty on 2/2. Post op delirium/confusion but MRI at that time no acute finding, no stroke or hemorrhage. No sign of CAA. He was put on ASA 81 bid for 4 weeks for DVT prophylaxis. He was discharged to SNF on 11/20/23. Readmitted on 2/15 with fall at SNF and left eyebrow laceration. CT head showed left frontal small ICH with bilateral small SAH, concerning for traumatic ICH with contusion. However repeat CT 2/16 showed significant enlarged hematoma on the left frontal area with multi-lobe configuration and IVH and SAH but no hydro with 5mm MLS.     ICH:  left frontal large ICH, etiology unclear, traumatic ICH vs. Spontaneous ICH. Less likely for hemorrhagic infarct CT head 2/16 left frontal small ICH with bilateral small SAH, concerning for traumatic ICH with contusion.  Repeat CT 2/16 showed significant enlarged hematoma on the left frontal area with multi-lobe configuration and IVH and SAH but no hydro with 5mm MLS.    Repeat CT 2/17 Similar appearance of intraparenchymal hematoma in the left frontal lobe with associated mass effect and 5 mm rightward midline shift. CTA head and neck - no spot sign. 60% R ICA bulb, b/l distal VA mod to severe stenosis,  mod left VA origin stenosis and mild to mod b/l ICA siphon CT repeat 2/18 large left frontal ICH 108cc vs. 2/17 105cc but with MLS of 7-8 mm.  MRI with and without unchanged size of large left frontal intraparenchymal hematoma with 4 mm of anterior rightward midline shift. Blood layering within both lateral ventricle. Subarachnoid blood over  both hemisphere.  CT repeat 2/20 stable hematoma and MLS 2D Echo  EF 60-65% EEG left frontal sharp waves,  moderate diffuse encephalopathy, no seizure LDL 57 HgbA1c 6.1 SCDs for VTE prophylaxis aspirin 81 mg daily bid prior to admission, now on No antithrombotic.  Keppra 500 -> 750 mg bid Ongoing aggressive stroke risk factor management Therapy recommendations:  SNF Disposition:  pending  Cerebral edema Repeat CT 2/17 5 mm rightward midline shift. CT repeat 2/18 MLS of 7-8 mm.  CT repeat 2/20 stable hematoma and midline shift MRI Brain 2/22, MLS of 4 mm Na 140-134-147-151-154-152-157-158 - 154 - 155  Put on 3% saline @ 50-> NS @ 50->25. 3% saline Held on 2/22 Bolus with 23.4% x 1 Na monitoring with goal 150-155  Diabetes HgbA1c 6.1 goal < 7.0 Controlled CBG monitoring SSI DM education and close PCP follow up  Hypertension Stable on the high end On norvasc 10 and metoprolol 50 bid Off IV metoprolol BP goal < 160 Long term BP goal normotensive  Hyperlipidemia Home meds:  lipitor 40  LDL 57, goal < 70 Consider to resume lipitor on discharge  Fever Tmax 100.8 CXR 2/21 No active disease and UA pending Close monitoring  AKI Creatinine 1.29--1.17--1.49--1.32--1.37 On IV fluid and tube feeding Monitoring BMP  Dysphagia Did not pass swallow Currently n.p.o. cortrak placed On tube feeding  Other Stroke Risk Factors Advanced age  Other Active Problems Anemia, hemoglobin 8.7--8.0--8.6--7.8--8.2, CBC monitoring Wife denies hx of dementia and stated that pt was functional before the fall earlier this month Recent fracture - fall with acute transverse fracture of the left femoral neck with varus angulation of the fracture fragments. Underwent hemiarthroplasty on 2/2.   Hospital day # 7  This patient is critically ill due to large frontal ICH, cerebral edema and at significant risk of neurological worsening, death form brain herniation, seizure. This patient's care  requires constant monitoring of vital signs, hemodynamics, respiratory and cardiac monitoring, review of multiple databases, neurological assessment, discussion with family, other specialists and medical decision making of high complexity. I spent 35 minutes of neurocritical care time in the care of this patient.     Windell Norfolk, MD Neurology 12/07/2023 2:41 PM    To contact Stroke Continuity provider, please refer to [email or web address]. After hours, contact General Neurology

## 2023-12-07 NOTE — Progress Notes (Signed)
 NAME:  William Todd, MRN:  [id number], DOB:  [birth year], LOS: 7 ADMISSION DATE:  11/29/2023, CONSULTATION DATE:  11/29/2023 REFERRING MD:  Dr. Clayborne Dana - EDP, CHIEF COMPLAINT:  SAH with IPH   History of Present Illness:  William Todd is a 73 y.o. male with a past medical history significant for HTN, recurrent mechanical falls, protein calorie malnutrition, type 2 diabetes, chronic kidney disease, and prostate cancer 2014 who presented to the ED via EMS from [facility] after suffering ground-level fall at [facility].  Workup on admission revealed acute intraparenchymal hemorrhage of the left frontal lobe with multiple bilateral small volume SAH doubt shift, neurosurgery was consulted with no surgical interventions recommended.  Patient was admitted initially per [facility].   While boarding in the emergency department patient had repeat CT scan to follow-up on ICH which revealed progressive left frontal hematoma with intraventricular extension into the fourth ventricle resulting in anterior midline shift of 5 mm, no hydrocephalus.  Neurosurgery reviewed imaging and recommended admission to ICU under PCCM for close monitoring.   Pertinent  Medical History  HTN, recurrent mechanical falls, protein calorie malnutrition, type 2 diabetes, chronic kidney disease, and prostate cancer 2014   Significant Hospital Events: Including procedures, antibiotic start and stop dates in addition to other pertinent events   2/15 presented after ground-level fall from SNF workup revealed ICH with bilateral [facility] 2/16 repeat CT while boarding in ED revealed progressive left frontal hematoma with intraventricular extension into the fourth ventricle resulting in anterior midline shift of 5 mm, no hydrocephalus.  Admission changed to ICU 2/18-CT angio, CT head with enlarging clot 2/20 - Stable left frontal hematoma with mild mass effect.  Interim History / Subjective:  Examination remains unchanged.   Objective   Blood pressure  131/63, pulse 95, temperature 99.8 F (37.7 C), temperature source Axillary, resp. rate (!) 21, weight 59.3 kg, SpO2 100%.        Intake/Output Summary (Last 24 hours) at 12/07/2023 1339 Last data filed at 12/07/2023 1218 Gross per 24 hour  Intake 1481.33 ml  Output 1300 ml  Net 181.33 ml   Filed Weights   12/05/23 0500 12/06/23 0500 12/07/23 0428  Weight: 61 kg 60.1 kg 59.3 kg   Examination: General: Thin man.  Lying in bed. HEENT: Moist oral mucosa,  Neuro: Responds symmetrically with normal strength, more briskly on the left side.  CV: Normal heart sounds.  Extremities warm. PULM: No respiratory distress.  Chest clear bilaterally. GI: Soft with no distention. Extremities: warm/dry, no edema  Skin: no rashes or lesions   Ancillary test personally reviewed  2/20 Large left frontal intraparenchymal hemorrhage of different densities.  Some subarachnoid bleeding.  Imaging stable from prior study. Sodium 155.  Creatinine 1.40 Glycemic control has improved.  Anemia of chronic disease 8.4 MRI shows stable hemorrhage.  KUB - SBFT in stomach, possible ileus Assessment & Plan:   Left frontal intracerebral traumatic hematoma Bilateral subarachnoid hemorrhages secondary to trauma History of mood disorder History of memory impairment CKD stage IIIb Hypertension Type 2 diabetes History of frailty with recurrent mechanical falls.  Plan:  -Exam remains unchanged. Lack of cooperation may be due to frontal lobe bleed causing abulia.  -Started on amantadine 2/22. - Increase free water and continue to follow Na. Hypernatremia may also be contributing to lethargy.  - Add prokinetic agent  - Continue current insulin dosing.  - Continue current antihypertensives - Constipation should improve with correction of hypernatremia.   Best Practice (right click  and "Reselect all SmartList Selections" daily)   Diet/type: On tube feeds via smallbore feeding tube. DVT prophylaxis SCD can  restart chemical prophylaxis given stable imaging. Pressure ulcer(s): N/A GI prophylaxis: PPI Lines: N/A Foley:  N/A Code Status:  full code Last date of multidisciplinary goals of care discussion: family updated at the bedside 2/23 - informed that road to recovery will be slow.   Lynnell Catalan, MD [facility] ICU Physician [facility] [place] [facility]  Pager: [phone] Or Epic Secure Chat After hours: [phone].  12/07/2023, 1:39 PM

## 2023-12-08 DIAGNOSIS — I619 Nontraumatic intracerebral hemorrhage, unspecified: Secondary | ICD-10-CM | POA: Diagnosis not present

## 2023-12-08 DIAGNOSIS — I609 Nontraumatic subarachnoid hemorrhage, unspecified: Secondary | ICD-10-CM | POA: Diagnosis not present

## 2023-12-08 DIAGNOSIS — J9601 Acute respiratory failure with hypoxia: Secondary | ICD-10-CM | POA: Diagnosis not present

## 2023-12-08 LAB — BASIC METABOLIC PANEL
Anion gap: 8 (ref 5–15)
BUN: 41 mg/dL — ABNORMAL HIGH (ref 8–23)
CO2: 27 mmol/L (ref 22–32)
Calcium: 8.6 mg/dL — ABNORMAL LOW (ref 8.9–10.3)
Chloride: 118 mmol/L — ABNORMAL HIGH (ref 98–111)
Creatinine, Ser: 1.79 mg/dL — ABNORMAL HIGH (ref 0.61–1.24)
GFR, Estimated: 40 mL/min — ABNORMAL LOW (ref >60)
Glucose, Bld: 202 mg/dL — ABNORMAL HIGH (ref 70–99)
Potassium: 4.1 mmol/L (ref 3.5–5.1)
Sodium: 153 mmol/L — ABNORMAL HIGH (ref 135–145)

## 2023-12-08 LAB — GLUCOSE, CAPILLARY
Glucose-Capillary: 146 mg/dL — ABNORMAL HIGH (ref 70–99)
Glucose-Capillary: 157 mg/dL — ABNORMAL HIGH (ref 70–99)
Glucose-Capillary: 157 mg/dL — ABNORMAL HIGH (ref 70–99)
Glucose-Capillary: 164 mg/dL — ABNORMAL HIGH (ref 70–99)
Glucose-Capillary: 170 mg/dL — ABNORMAL HIGH (ref 70–99)
Glucose-Capillary: 229 mg/dL — ABNORMAL HIGH (ref 70–99)

## 2023-12-08 LAB — PROCALCITONIN: Procalcitonin: 0.15 ng/mL

## 2023-12-08 MED ORDER — AMLODIPINE BESYLATE 10 MG PO TABS
10.0000 mg | ORAL_TABLET | Freq: Every day | ORAL | Status: DC
  Administered 2023-12-09: 10 mg
  Filled 2023-12-08 (×2): qty 1

## 2023-12-08 MED ORDER — ACETAMINOPHEN 500 MG PO TABS: 500.0000 mg | ORAL_TABLET | Freq: Four times a day (QID) | ORAL | Status: DC | PRN

## 2023-12-08 MED ORDER — AMLODIPINE BESYLATE 5 MG PO TABS: 5.0000 mg | ORAL_TABLET | Freq: Every day | ORAL | Status: DC

## 2023-12-08 MED ORDER — LACTATED RINGERS IV BOLUS
500.0000 mL | Freq: Once | INTRAVENOUS | Status: AC
  Administered 2023-12-08: 500 mL via INTRAVENOUS

## 2023-12-08 MED ORDER — ACETAMINOPHEN 500 MG PO TABS
500.0000 mg | ORAL_TABLET | Freq: Four times a day (QID) | ORAL | Status: AC | PRN
  Administered 2023-12-08 – 2023-12-09 (×3): 500 mg
  Filled 2023-12-08 (×3): qty 1

## 2023-12-08 NOTE — Progress Notes (Signed)
 STROKE TEAM PROGRESS NOTE   SUBJECTIVE (INTERVAL HISTORY) His RN is at the bedside. Pt no acute event overnight, neuro unchanged. No family at bedside  Stable per ICU nurse. Does not follow commands.   Na is 153.  Off hypertonic.  On Tfs.   OBJECTIVE Temp:  [98.9 F (37.2 C)-101.2 F (38.4 C)] 98.9 F (37.2 C) (02/24 0800) Pulse Rate:  [75-99] 95 (02/24 0900) Cardiac Rhythm: Normal sinus rhythm;Sinus tachycardia (02/23 2000) Resp:  [15-26] 18 (02/24 0900) BP: (85-135)/(52-76) 115/61 (02/24 0900) SpO2:  [97 %-100 %] 99 % (02/24 0900) Weight:  [58.9 kg-60 kg] 60 kg (02/24 0408)  Recent Labs  Lab 12/07/23 1509 12/07/23 1955 12/07/23 2344 12/08/23 0348 12/08/23 0734  GLUCAP 233* 149* 167* 146* 170*   Recent Labs  Lab  0000 12/01/23 1610 12/02/23 0624 12/02/23 1444 12/03/23 0456 12/03/23 0929 12/04/23 0431 12/04/23 0828 12/05/23 0822 12/05/23 1555 12/06/23 0415 12/06/23 0748 12/06/23 1946 12/07/23 0152 12/07/23 0848 12/07/23 1308 12/08/23 0525  NA  --   --  134*   < > 147*   < > 151*   < > 154*   < > 158*   < > 157* 154* 155* 155* 153*  K   < >  --  3.9  --  3.7  --  3.6  --  4.2  --  3.8  --   --   --  3.6  --  4.1  CL   < >  --  97*  --  112*  --  117*  --  121*  --  118*  --   --   --  118*  --  118*  CO2   < >  --  24  --  22  --  27  --  27  --  27  --   --   --  27  --  27  GLUCOSE   < >  --  215*  --  173*  --  362*  --  284*  --  142*  --   --   --  163*  --  202*  BUN   < >  --  19  --  16  --  18  --  25*  --  29*  --   --   --  34*  --  41*  CREATININE   < >  --  1.17  --  1.49*  --  1.32*  --  1.37*  --  1.47*  --   --   --  1.40*  --  1.79*  CALCIUM   < >  --  8.6*  --  9.2  --  8.7*  --  8.7*  --  9.5  --   --   --  9.0  --  8.6*  MG  --  1.7 1.7  --  1.8  --   --   --  2.1  --   --   --   --   --   --   --   --   PHOS  --  3.7 3.4  --  3.0  --   --   --   --   --   --   --   --   --   --   --   --    < > = values in this interval not displayed.    No results for input(s): "AST", "ALT", "  ALKPHOS", "BILITOT", "PROT", "ALBUMIN" in the last 168 hours.  Recent Labs  Lab 12/02/23 0624 12/03/23 0456 12/04/23 0431 12/05/23 0822 12/06/23 0415  WBC 8.9 7.7 6.8 9.1 9.4  HGB 8.0* 8.6* 7.8* 8.2* 8.4*  HCT 23.9* 26.1* 24.2* 25.9* 27.0*  MCV 90.9 92.2 93.4 95.9 95.1  PLT 164 156 145* 137* 137*   No results for input(s): "CKTOTAL", "CKMB", "CKMBINDEX", "TROPONINI" in the last 168 hours. No results for input(s): "LABPROT", "INR" in the last 72 hours.       Component Value Date/Time   CHOL 111 12/02/2023 0624   TRIG 63 12/02/2023 0624   HDL 41 12/02/2023 0624   CHOLHDL 2.7 12/02/2023 0624   VLDL 13 12/02/2023 0624   LDLCALC 57 12/02/2023 0624   Lab Results  Component Value Date   HGBA1C 6.1 (H) 12/02/2023    DG Abd Portable 1V Result Date: 12/06/2023 CLINICAL DATA:  Nasogastric tube placement. EXAM: PORTABLE ABDOMEN - 1 VIEW COMPARISON:  December 03, 2023. FINDINGS: Distal tip of feeding tube is seen in expected position of proximal stomach. IMPRESSION: Distal tip of feeding tube is seen in expected position of proximal stomach. Electronically Signed   By: Lupita Raider M.D.   On: 12/06/2023 09:19   DG CHEST PORT 1 VIEW Result Date: 12/05/2023 CLINICAL DATA:  Fever EXAM: PORTABLE CHEST 1 VIEW COMPARISON:  11/29/2023 FINDINGS: Enteric tube tip at the distal esophagus. Lung fields are clear. Cardiac size is normal. Aortic atherosclerosis. IMPRESSION: No active disease. Enteric tube tip at the distal esophagus, recommend advancement for more optimal positioning. Electronically Signed   By: Jasmine Pang M.D.   On: 12/05/2023 21:13   MR BRAIN W WO CONTRAST Result Date: 12/05/2023 CLINICAL DATA:  Hemorrhagic stroke EXAM: MRI HEAD WITHOUT AND WITH CONTRAST TECHNIQUE: Multiplanar, multiecho pulse sequences of the brain and surrounding structures were obtained without and with intravenous contrast. CONTRAST:  6mL GADAVIST GADOBUTROL 1  MMOL/ML IV SOLN COMPARISON:  Head CT 12/04/2023 FINDINGS: Brain: Unchanged size of large left frontal intraparenchymal hematoma. Blood layering within both lateral ventricles. Subarachnoid blood over both hemispheres. There is multifocal hyperintense T2-weighted signal within the white matter. Generalized volume loss. There is 4 mm of anterior rightward midline shift. The midline structures are normal. There is no abnormal contrast enhancement. Vascular: Normal flow voids. Skull and upper cervical spine: Normal calvarium and skull base. Visualized upper cervical spine and soft tissues are normal. Sinuses/Orbits:No paranasal sinus fluid levels or advanced mucosal thickening. No mastoid or middle ear effusion. Normal orbits. IMPRESSION: 1. Unchanged size of large left frontal intraparenchymal hematoma with 4 mm of anterior rightward midline shift. 2. Blood layering within both lateral ventricles. 3. Subarachnoid blood over both hemispheres. Electronically Signed   By: Deatra Robinson M.D.   On: 12/05/2023 19:24   CT HEAD WO CONTRAST ( ) Result Date: 12/04/2023 CLINICAL DATA:  73 year old male with multifocal intracranial hemorrhage after a fall. Subsequent encounter. EXAM: CT HEAD WITHOUT CONTRAST TECHNIQUE: Contiguous axial images were obtained from the base of the skull through the vertex without intravenous contrast. RADIATION DOSE REDUCTION: This exam was performed according to the departmental dose-optimization program which includes automated exposure control, adjustment of the mA and/or kV according to patient size and/or use of iterative reconstruction technique. COMPARISON:  12/02/2023 and earlier. FINDINGS: Brain: Left anterior frontal lobe intra-axial hemorrhage remains hyperdense and amorphous. Due to scan angulation the most direct comparison now is on sagittal images (series 6, image 39 today versus the same series and  image 2 days ago), where hemorrhage size and configuration appear unchanged.  Regional edema and mass effect also unchanged. Scattered bilateral subarachnoid blood has not significantly changed. Small volume layering intraventricular hemorrhage has not significantly changed. Stable ventricle size and configuration, no ventriculomegaly. Stable mild rightward midline shift limited to the anterior septum pellucidum. Basilar cisterns are stable. Stable gray-white matter differentiation throughout the brain. Left frontal lobe edema surrounding the hematoma and chronic right PCA territory infarct with encephalomalacia. Vascular: No suspicious intracranial vascular hyperdensity. Skull: Intact, stable. Incidental large maxillary torus exostoses incidentally noted. Sinuses/Orbits: Left nasoenteric tube now in place. Visualized paranasal sinuses and mastoids are stable and well aerated. Other: Stable orbit and scalp soft tissues. IMPRESSION: 1. Continued stability of large anterior left frontal lobar hemorrhage, associated edema. Relatively mild intracranial mass effect, scattered small volume bilateral SAH, and IVH remain stable. 2. No new intracranial abnormality. Chronic right PCA territory infarct. Electronically Signed   By: Odessa Fleming M.D.   On: 12/04/2023 06:37   EEG adult Result Date: 12/03/2023 Charlsie Quest, MD     12/03/2023  4:09 PM Patient Name: GRAIG HESSLING MRN: [id number] Epilepsy Attending: Charlsie Quest Referring Physician/Provider: Elmer Picker, NP Date: 12/03/2023 Duration: 23.45 mins Patient history: 73yo M with left frontal ICH. EEG to evaluate for seizure Level of alertness: Awake, asleep AEDs during EEG study: LEV Technical aspects: This EEG study was done with scalp electrodes positioned according to the 10-20 International system of electrode placement. Electrical activity was reviewed with band pass filter of 1-70Hz , sensitivity of 7 uV/mm, display speed of 104mm/sec with a 60Hz  notched filter applied as appropriate. EEG data were recorded continuously and digitally  stored.  Video monitoring was available and reviewed as appropriate. Description: No clear posterior dominant rhythm was seen. Sleep was characterized by sleep spindles (12 to 14 Hz), maximal frontocentral region. EEG showed continuous generalized and maximal left frontal 3 to 6 Hz theta-delta slowing. Sharp waves were noted in left frontal region,at times periodic at 1hz . Hyperventilation and photic stimulation were not performed.   ABNORMALITY - Sharp wave, left frontal region - Continuous slow, generalized and maximal left frontal region IMPRESSION: This study showed evidence of epileptogenicity and cortical dysfunction arising from left frontal region likely secondary to underlying ICH. Additionally there is moderate diffuse encephalopathy. No seizures were seen throughout the recording. If suspicion for ictal activity remains a concern, a prolonged study can be considered. Charlsie Quest   DG Abd Portable 1V Result Date: 12/03/2023 CLINICAL DATA:  73 year old male feeding tube placement. EXAM: PORTABLE ABDOMEN - 1 VIEW COMPARISON:  12/01/2023 and earlier. FINDINGS: Portable AP semi upright view at 1038 hours. Enteric feeding tube is in place within the proximal stomach, looped at the level of the gastric fundus. Negative lung bases. Nonobstructed bowel gas pattern. No acute osseous abnormality identified. IMPRESSION: Enteric feeding tube placed into the proximal stomach. Electronically Signed   By: Odessa Fleming M.D.   On: 12/03/2023 11:19   CT HEAD WO CONTRAST ( ) Result Date: 12/02/2023 CLINICAL DATA:  73 year old male with multifocal intracranial hemorrhage status post fall. Hemorrhage progression after presentation. EXAM: CT HEAD WITHOUT CONTRAST TECHNIQUE: Contiguous axial images were obtained from the base of the skull through the vertex without intravenous contrast. RADIATION DOSE REDUCTION: This exam was performed according to the departmental dose-optimization program which includes automated  exposure control, adjustment of the mA and/or kV according to patient size and/or use of iterative reconstruction technique. COMPARISON:  CTA head and  neck this morning. Head CT yesterday and earlier. FINDINGS: Brain: Large volume mixed density but mostly hyperdense intra-axial hemorrhage in the anterior left frontal lobe encompasses 78 by 44 x 63 mm (AP by transverse by CC) versus 79 x 45 x 61 mm yesterday when measured with the same technique. Volume estimated at 108 mL, versus 105 mL on 11/30/2023, and hemorrhage size and configuration appears visually stable. Superimposed scattered bilateral subarachnoid blood and a moderate volume of mostly now layering lateral intraventricular blood also appears stable. Rightward midline shift at the anterior septum pellucidum of 7-8 mm has increased from 5-6 mm on 11/30/2023. Ventricle size and configuration is stable. Stable basilar cistern patency. Superimposed chronic right PCA territory encephalomalacia and confluent bilateral cerebral white matter hypodensity. Vascular: Calcified atherosclerosis at the skull base. No suspicious intracranial vascular hyperdensity. Skull: Stable, intact. Sinuses/Orbits: Visualized paranasal sinuses and mastoids are stable and well aerated. Other: Stable, negative orbit and scalp soft tissues. IMPRESSION: 1. Large volume anterior frontal lobe intra-axial hemorrhage does not appear significantly changed in size or configuration since 11/30/2023. Using largest orthogonal dimensions, estimated blood volume is 108 mL now vs. 105 mL then. 2. Intracranial mass effect with mildly increased rightward midline shift since 11/30/2023, now 7-8 mm. Basilar cisterns remain patent. 3. Stable bilateral SAH and IVH. Stable ventricle size and configuration. 4. Chronic right PCA territory infarct. Nonspecific white matter changes. Electronically Signed   By: Odessa Fleming M.D.   On: 12/02/2023 11:47   CT ANGIO HEAD NECK W WO CM Result Date: 12/02/2023 CLINICAL  DATA:  73 year old male with multifocal intracranial hemorrhage status post fall. EXAM: CT ANGIOGRAPHY HEAD AND NECK TECHNIQUE: Multidetector CT imaging of the head and neck was performed using the standard protocol during bolus administration of intravenous contrast. Multiplanar CT image reconstructions and MIPs were obtained to evaluate the vascular anatomy. Carotid stenosis measurements (when applicable) are obtained utilizing NASCET criteria, using the distal internal carotid diameter as the denominator. RADIATION DOSE REDUCTION: This exam was performed according to the departmental dose-optimization program which includes automated exposure control, adjustment of the mA and/or kV according to patient size and/or use of iterative reconstruction technique. CONTRAST:  75mL OMNIPAQUE IOHEXOL 350 MG/ML SOLN COMPARISON:  Head CTs 12/01/2023 and earlier. Brain MRI 11/15/2023. FINDINGS: CTA NECK Skeleton: Cervical spine degeneration. 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 No acute osseous abnormality identified. Upper chest: Upper lungs are clear. Negative visible superior mediastinum. Other neck: Neck soft tissue spaces appear within normal limits. Aortic arch: Calcified aortic atherosclerosis.  3 vessel arch. Right carotid system: No significant brachiocephalic artery or or right CCA origin atherosclerosis. Soft and calcified plaque of the right CCA before the bifurcation without stenosis. Bulky calcified atherosclerosis of the proximal right ICA beginning at the origin but most pronounced at the bulb. Subsequent distal bulb stenosis is numerically estimated at 60 % with respect to the distal vessel (series 7, image 98 and series 9, image 155). Right ICA remains patent to the skull base. Left carotid system: Minimal left CCA atherosclerosis before the bifurcation. Soft and calcified plaque at the left ICA origin and more pronounced at the bulb. Up to 50 % stenosis with  respect to the distal vessel. Additional calcified plaque of the left ICA just below the skull base without stenosis. Vertebral arteries: Mild proximal right subclavian atherosclerosis without stenosis. Calcified plaque at the right vertebral artery origin with only mild stenosis on series 8, image 145. Right vertebral is patent to the skull base  with no significant plaque or stenosis. Proximal left subclavian artery soft and calcified plaque without stenosis. Mostly soft plaque at the left vertebral artery origin with up to moderate origin stenosis on series 8, image 135. Left vertebral remains patent and is fairly codominant in the neck. Mild left V3 segment calcified plaque with no additional significant stenosis to the skull base. CTA HEAD Posterior circulation: Bilateral V4 segment atherosclerosis. Bulky and severe right V4 calcified plaque and stenosis on series 5, image 145. The right vertebrobasilar junction remains patent. Contralateral moderate left V4 segment stenosis proximal to the left PICA origin which remains normal. Right PICA origin is patent. Patent left vertebrobasilar junction. Patent basilar artery without stenosis. Fetal type bilateral PCA origins. Patent SCA origins. Bilateral PCA branches are within normal limits. Anterior circulation: Both ICA siphons are patent. Left siphon cavernous segment calcified plaque is moderate with mild to moderate stenosis at the anterior genu. Normal left posterior communicating artery origin. Right ICA cavernous through supraclinoid segment calcified plaque is moderate with mild to moderate supraclinoid stenosis. Normal right posterior communicating artery origin. Patent carotid termini. Patent MCA origins. Dominant right and diminutive or absent left ACA A1 segments. Anterior communicating artery and bilateral ACA branches are within normal limits. Left MCA M1 segment and bifurcation are patent without stenosis. There is mild mass effect on the left MCA  branches which otherwise appear within normal limits. Right MCA M1 segment and trifurcation are patent without stenosis. Right MCA branches are within normal limits. No CTA spot sign or abnormal vascularity associated with the left anterior superior frontal lobe intra-axial hemorrhage. Other findings: Grossly stable intracranial hemorrhage, mass effect from the head CT yesterday. Venous sinuses: Early contrast timing. Superior sagittal sinus, torcula, straight sinus, vein of Galen and internal cerebral veins, dominant appearing right transverse and sigmoid venous sinuses appear to be enhancing and patent. Anatomic variants: Fetal type bilateral PCA origins. Dominant right ACA A1. Review of the MIP images confirms the above findings IMPRESSION: 1. Negative for CTA spot sign in association with acute intracranial hemorrhage. Mild mass effect on left MCA vasculature. Negative for intracranial aneurysm, vascular malformation, or large vessel occlusion. 2. Atherosclerosis in the head and neck notable for: - 60% stenosis of the Right ICA bulb. - Moderate To Severe bilateral distal vertebral artery stenosis. - Moderate Left Vertebral Artery origin stenosis. - mild to moderate bilateral ICA siphon stenosis. 3.  Aortic Atherosclerosis (ICD10-I70.0). Electronically Signed   By: Odessa Fleming M.D.   On: 12/02/2023 06:41   DG Abd Portable 1V Result Date: 12/01/2023 CLINICAL DATA:  NG placement. EXAM: PORTABLE ABDOMEN - 1 VIEW COMPARISON:  CT abdomen pelvis dated 07/01/2013. FINDINGS: Feeding tube with tip in the left upper abdomen likely in the region of the gastric fundus. Large amount of stool throughout the colon. IMPRESSION: Feeding tube with tip in the region of the gastric fundus. Electronically Signed   By: Elgie Collard M.D.   On: 12/01/2023 18:55   CT HEAD WO CONTRAST ( ) Result Date: 12/01/2023 CLINICAL DATA:  Follow-up of intracranial hemorrhage. EXAM: CT HEAD WITHOUT CONTRAST TECHNIQUE: Contiguous axial images  were obtained from the base of the skull through the vertex without intravenous contrast. RADIATION DOSE REDUCTION: This exam was performed according to the departmental dose-optimization program which includes automated exposure control, adjustment of the mA and/or kV according to patient size and/or use of iterative reconstruction technique. COMPARISON:  None Available. FINDINGS: Brain: Redemonstrated intraparenchymal hematoma in the anterior left frontal lobe which measures 7.9  x 4.9 x 3.6 cm, previously measuring 7.8 x 5.2 x 3.4 cm when remeasured in a similar manner. Similar surrounding edema and associated mass effect. Approximately 5 mm rightward midline shift anteriorly similar to prior. Extension of hemorrhage into the adjacent left frontal horn with similar appearance of blood products in the left lateral ventricle. Increased blood products within the atrium and occipital horn of the right lateral ventricle. Slightly decreased blood products in the third ventricle. Small focus of subdural hemorrhage along the anterior falx. Scattered areas of subarachnoid hemorrhage along the right frontal lobe and right sylvian fissure again noted. Remote infarct in the right occipital lobe. Vascular: No hyperdense vessel or unexpected calcification. Skull: Normal. Negative for fracture or focal lesion. Sinuses/Orbits: No acute finding. Other: Mastoid air cells are clear. IMPRESSION: Similar appearance of intraparenchymal hematoma in the left frontal lobe with associated mass effect and 5 mm rightward midline shift. Additional small multi compartment intracranial hemorrhage as above. Slightly increased blood products within the atrium and occipital horn of the right lateral ventricle. Electronically Signed   By: Emily Filbert M.D.   On: 12/01/2023 13:34   ECHOCARDIOGRAM COMPLETE Result Date: 11/30/2023    ECHOCARDIOGRAM REPORT   Patient Name:   William Todd Date of Exam: 11/30/2023 Medical Rec #:  [id number]         Height:       72.0 in Accession #:    [id number]       Weight:       147.0 lb Date of Birth:  1951/10/01         BSA:          1.869 m Patient Age:    72 years         BP:           129/68 mmHg Patient Gender: M                HR:           77 bpm. Exam Location:  Inpatient Procedure: 2D Echo (Both Spectral and Color Flow Doppler were utilized during            procedure). Indications:    intracerebral hemorrhage  History:        Patient has prior history of Echocardiogram examinations, most                 recent 01/02/2013. Risk Factors:Hypertension, Diabetes and                 Dyslipidemia.  Sonographer:    Delcie Roch RDCS Referring Phys: [phone] WHITNEY D HARRIS IMPRESSIONS  1. Left ventricular ejection fraction, by estimation, is 60 to 65%. The left ventricle has normal function. The left ventricle has no regional wall motion abnormalities. Left ventricular diastolic parameters were normal.  2. Right ventricular systolic function is normal. The right ventricular size is normal. There is mildly elevated pulmonary artery systolic pressure.  3. The mitral valve is degenerative. Mild mitral valve regurgitation. No evidence of mitral stenosis.  4. The aortic valve is tricuspid. There is mild calcification of the aortic valve. Aortic valve regurgitation is not visualized. Aortic valve sclerosis is present, with no evidence of aortic valve stenosis.  5. The inferior vena cava is normal in size with greater than 50% respiratory variability, suggesting right atrial pressure of 3 mmHg. FINDINGS  Left Ventricle: Left ventricular ejection fraction, by estimation, is 60 to 65%. The left ventricle has normal function. The left ventricle  has no regional wall motion abnormalities. Strain imaging was not performed. The left ventricular internal cavity  size was normal in size. There is no left ventricular hypertrophy. Left ventricular diastolic parameters were normal. Right Ventricle: The right ventricular size is normal.  No increase in right ventricular wall thickness. Right ventricular systolic function is normal. There is mildly elevated pulmonary artery systolic pressure. The tricuspid regurgitant velocity is 2.89  m/s, and with an assumed right atrial pressure of 3 mmHg, the estimated right ventricular systolic pressure is 36.4 mmHg. Left Atrium: Left atrial size was normal in size. Right Atrium: Right atrial size was normal in size. Pericardium: There is no evidence of pericardial effusion. Mitral Valve: The mitral valve is degenerative in appearance. There is mild calcification of the anterior mitral valve leaflet(s). Mild mitral valve regurgitation. No evidence of mitral valve stenosis. Tricuspid Valve: The tricuspid valve is grossly normal. Tricuspid valve regurgitation is mild . No evidence of tricuspid stenosis. Aortic Valve: The aortic valve is tricuspid. There is mild calcification of the aortic valve. Aortic valve regurgitation is not visualized. Aortic valve sclerosis is present, with no evidence of aortic valve stenosis. Pulmonic Valve: The pulmonic valve was grossly normal. Pulmonic valve regurgitation is not visualized. No evidence of pulmonic stenosis. Aorta: The aortic root and ascending aorta are structurally normal, with no evidence of dilitation. Venous: The inferior vena cava is normal in size with greater than 50% respiratory variability, suggesting right atrial pressure of 3 mmHg. IAS/Shunts: The atrial septum is grossly normal. Additional Comments: 3D imaging was not performed.  LEFT VENTRICLE PLAX 2D LVIDd:         3.20 cm   Diastology LVIDs:         1.70 cm   LV e' medial:    8.16 cm/s LV PW:         1.20 cm   LV E/e' medial:  10.7 LV IVS:        1.00 cm   LV e' lateral:   11.20 cm/s LVOT diam:     2.40 cm   LV E/e' lateral: 7.8 LV SV:         115 LV SV Index:   61 LVOT Area:     4.52 cm  RIGHT VENTRICLE             IVC RV Basal diam:  2.80 cm     IVC diam: 1.70 cm RV S prime:     12.90 cm/s TAPSE  (M-mode): 2.2 cm LEFT ATRIUM             Index        RIGHT ATRIUM           Index LA diam:        3.30 cm 1.77 cm/m   RA Area:     13.00 cm LA Vol (A2C):   45.6 ml 24.39 ml/m  RA Volume:   29.50 ml  15.78 ml/m LA Vol (A4C):   39.1 ml 20.92 ml/m LA Biplane Vol: 43.8 ml 23.43 ml/m  AORTIC VALVE LVOT Vmax:   125.00 cm/s LVOT Vmean:  83.200 cm/s LVOT VTI:    0.254 m  AORTA Ao Root diam: 3.30 cm Ao Asc diam:  3.20 cm MITRAL VALVE               TRICUSPID VALVE MV Area (PHT): 3.37 cm    TR Peak grad:   33.4 mmHg MV Decel Time: 225 msec    TR Vmax:  289.00 cm/s MR Peak grad: 113.2 mmHg MR Mean grad: 73.0 mmHg    SHUNTS MR Vmax:      532.00 cm/s  Systemic VTI:  0.25 m MR Vmean:     403.0 cm/s   Systemic Diam: 2.40 cm MV E velocity: 87.30 cm/s MV A velocity: 70.40 cm/s MV E/A ratio:  1.24 Lennie Odor MD Electronically signed by Lennie Odor MD Signature Date/Time: 11/30/2023/8:57:24 PM    Final    CT Head Wo Contrast Result Date: 11/30/2023 CLINICAL DATA:  Follow-up head trauma EXAM: CT HEAD WITHOUT CONTRAST TECHNIQUE: Contiguous axial images were obtained from the base of the skull through the vertex without intravenous contrast. RADIATION DOSE REDUCTION: This exam was performed according to the departmental dose-optimization program which includes automated exposure control, adjustment of the mA and/or kV according to patient size and/or use of iterative reconstruction technique. COMPARISON:  Head CT from the same day at 2 a.m. FINDINGS: Brain: Growing hematoma in the anterior left frontal lobe, hemorrhagic area measuring 7.3 x 5 x 3.4 cm. The hemorrhage has now decompressed into the left lateral ventricle with tracking to the level of the fourth ventricle. No hydrocephalus. Patchy subarachnoid hemorrhage along the frontal lobes and sylvian fissures which appears similar. The hemorrhage and edema causes progressive swelling with anterior midline shift measuring 5 mm. Chronic right occipital infarct.  Low-density in the cerebral white matter from chronic small vessel ischemia. Vascular: No hyperdense vessel or unexpected calcification. Skull: No acute finding Sinuses/Orbits: No evidence of injury Critical Value/emergent results were called by telephone at the time of interpretation on 11/30/2023 at 8:45 am to provider Ogbata, who verbally acknowledged these results. IMPRESSION: Progressive left frontal hematoma now measuring 60 cc with new intraventricular extension reaching the fourth ventricle. Patchy bilateral subarachnoid hemorrhage appears similar to prior. The growing hematoma and edema causes anterior midline shift by 5 mm. No hydrocephalus. Electronically Signed   By: Tiburcio Pea M.D.   On: 11/30/2023 08:46   CT Head Wo Contrast Result Date: 11/30/2023 CLINICAL DATA:  Head trauma, minor (Age >= 65y); Neck trauma (Age >= 65y) EXAM: CT HEAD WITHOUT CONTRAST CT CERVICAL SPINE WITHOUT CONTRAST TECHNIQUE: Multidetector CT imaging of the head and cervical spine was performed following the standard protocol without intravenous contrast. Multiplanar CT image reconstructions of the cervical spine were also generated. RADIATION DOSE REDUCTION: This exam was performed according to the departmental dose-optimization program which includes automated exposure control, adjustment of the mA and/or kV according to patient size and/or use of iterative reconstruction technique. COMPARISON:  MRI head November 15, 2023. FINDINGS: CT HEAD FINDINGS Brain: Acute intraparenchymal hemorrhage in the left frontal lobe measuring 2.6 cm with overlying extra-axial extension. Small volume of multifocal subarachnoid hemorrhage bilaterally along the right frontal convexity, left frontal convexity and layering within the left sylvian fissure. No midline shift. No hydrocephalus. Remote right occipital infarct. Vascular: Calcific atherosclerosis.  No dense vessel. Skull: No acute fracture. Sinuses/Orbits: Clear sinuses.  No acute  orbital findings. Other: No mastoid effusions. CT CERVICAL SPINE FINDINGS Alignment: No substantial sagittal subluxation. Skull base and vertebrae: No acute fracture. Soft tissues and spinal canal: No prevertebral fluid or swelling. No visible canal hematoma. Disc levels:  Moderate multilevel degenerative change. Upper chest: Visualized lung apices are clear. IMPRESSION: 1. Acute intraparenchymal hemorrhage in left frontal lobe with multifocal bilateral small volume subarachnoid hemorrhage. No midline shift. 2. No acute fracture or traumatic malalignment in the cervical spine. Findings discussed with Dr. Clayborne Dana via telephone at 2:19  a.m. Electronically Signed   By: Feliberto Harts M.D.   On: 11/30/2023 02:21   CT Cervical Spine Wo Contrast Result Date: 11/30/2023 CLINICAL DATA:  Head trauma, minor (Age >= 65y); Neck trauma (Age >= 65y) EXAM: CT HEAD WITHOUT CONTRAST CT CERVICAL SPINE WITHOUT CONTRAST TECHNIQUE: Multidetector CT imaging of the head and cervical spine was performed following the standard protocol without intravenous contrast. Multiplanar CT image reconstructions of the cervical spine were also generated. RADIATION DOSE REDUCTION: This exam was performed according to the departmental dose-optimization program which includes automated exposure control, adjustment of the mA and/or kV according to patient size and/or use of iterative reconstruction technique. COMPARISON:  MRI head November 15, 2023. FINDINGS: CT HEAD FINDINGS Brain: Acute intraparenchymal hemorrhage in the left frontal lobe measuring 2.6 cm with overlying extra-axial extension. Small volume of multifocal subarachnoid hemorrhage bilaterally along the right frontal convexity, left frontal convexity and layering within the left sylvian fissure. No midline shift. No hydrocephalus. Remote right occipital infarct. Vascular: Calcific atherosclerosis.  No dense vessel. Skull: No acute fracture. Sinuses/Orbits: Clear sinuses.  No acute orbital  findings. Other: No mastoid effusions. CT CERVICAL SPINE FINDINGS Alignment: No substantial sagittal subluxation. Skull base and vertebrae: No acute fracture. Soft tissues and spinal canal: No prevertebral fluid or swelling. No visible canal hematoma. Disc levels:  Moderate multilevel degenerative change. Upper chest: Visualized lung apices are clear. IMPRESSION: 1. Acute intraparenchymal hemorrhage in left frontal lobe with multifocal bilateral small volume subarachnoid hemorrhage. No midline shift. 2. No acute fracture or traumatic malalignment in the cervical spine. Findings discussed with Dr. Clayborne Dana via telephone at 2:19 a.m. Electronically Signed   By: Feliberto Harts M.D.   On: 11/30/2023 02:21   DG Chest Portable 1 View Result Date: 11/30/2023 CLINICAL DATA:  Fall EXAM: PORTABLE CHEST 1 VIEW COMPARISON:  03/05/2012 FINDINGS: There is asymmetric airspace infiltrate within the right mid lung zone, possibly infectious or posttraumatic given the history of recent trauma. No pneumothorax or pleural effusion. Cardiac size within normal limits. Pulmonary vascularity is normal. No acute bone abnormality. IMPRESSION: 1. Asymmetric airspace infiltrate within the right mid lung zone, possibly infectious or posttraumatic given the history of recent trauma. Electronically Signed   By: Helyn Numbers M.D.   On: 11/30/2023 01:18   DG Pelvis Portable Result Date: 11/30/2023 CLINICAL DATA:  Fall EXAM: PORTABLE PELVIS 1-2 VIEWS COMPARISON:  None Available. FINDINGS: Left total hip arthroplasty has been performed. No acute fracture or dislocation. Mild right hip degenerative arthritis. Vascular calcifications noted. Soft tissues are otherwise unremarkable. IMPRESSION: 1. Left total hip arthroplasty. No acute fracture or dislocation. Electronically Signed   By: Helyn Numbers M.D.   On: 11/30/2023 01:17   DG HIP UNILAT W OR W/O PELVIS 2-3 VIEWS LEFT Result Date: 11/16/2023 CLINICAL DATA:  [id number] History of left hip  hemiarthroplasty [id number] EXAM: DG HIP (WITH OR WITHOUT PELVIS) 2-3V LEFT COMPARISON:  11/15/2023 FINDINGS: Interval left hip arthroplasty, components projecting in expected location. No fracture or dislocation. Surgical clips project over the pubic bones. Patchy iliofemoral arterial calcifications. IMPRESSION: Left hip arthroplasty, without apparent complication. Electronically Signed   By: Corlis Leak M.D.   On: 11/16/2023 16:11   MR BRAIN WO CONTRAST Result Date: 11/15/2023 CLINICAL DATA:  Abnl CT Head, evaluate for pontine infarct EXAM: MRI HEAD WITHOUT CONTRAST TECHNIQUE: Multiplanar, multiecho pulse sequences of the brain and surrounding structures were obtained without intravenous contrast. COMPARISON:  CT head from today. FINDINGS: Brain: No acute infarction, hemorrhage, hydrocephalus, extra-axial  collection or mass lesion. Remote right PCA territory infarct. Moderate T2/FLAIR hyperintensities in the white matter, compatible with chronic microvascular ischemic disease. Vascular: Major arterial flow voids are maintained at the skull base. Skull and upper cervical spine: Normal marrow signal. Sinuses/Orbits: Negative. Other: No sizable mastoid effusions. IMPRESSION: 1. No evidence of acute intracranial abnormality. 2. Remote right PCA territory infarct. Electronically Signed   By: Feliberto Harts M.D.   On: 11/15/2023 22:38   CT Head Wo Contrast Result Date: 11/15/2023 CLINICAL DATA:  Altered mental status, fall EXAM: CT HEAD WITHOUT CONTRAST TECHNIQUE: Contiguous axial images were obtained from the base of the skull through the vertex without intravenous contrast. RADIATION DOSE REDUCTION: This exam was performed according to the departmental dose-optimization program which includes automated exposure control, adjustment of the mA and/or kV according to patient size and/or use of iterative reconstruction technique. COMPARISON:  None Available. FINDINGS: Brain: No acute territorial infarction, hemorrhage  or intracranial mass. 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 Atrophy and advanced chronic small vessel ischemic changes of the white matter. Hypodensity at the pons, series 2, image 11 and sagittal series 6 image 26. Ventricles are nonenlarged Vascular: No hyperdense vessels. Carotid and vertebral calcifications. Skull: Normal. Negative for fracture or focal lesion. Sinuses/Orbits: No acute finding. Other: None IMPRESSION: 1. Negative for acute intracranial hemorrhage or mass. 2. Hypodensity at the pons, though some of this may be related to artifact, the finding is rounded with some sparing of peripheral tissue and acute or subacute pontine infarct cannot be excluded. MRI is recommended for further evaluation. 3. Atrophy and chronic small vessel ischemic changes of the white matter Electronically Signed   By: Jasmine Pang M.D.   On: 11/15/2023 18:51   DG Ankle Complete Left Result Date: 11/15/2023 CLINICAL DATA:  Fall with ankle pain EXAM: LEFT ANKLE COMPLETE - 3+ VIEW COMPARISON:  01/30/2012 FINDINGS: Lateral plate and multiple fixating screws with additional medial malleolar fixating screws. Chronic postsurgical, posttraumatic and post infectious changes involving the distal tibia, distal fibula and tibiotalar joint. Fused appearance across the tibiotalar joint and probable subtalar fusion as well. No definite acute fracture is seen. IMPRESSION: Chronic postsurgical, posttraumatic and post infectious changes involving the distal tibia, distal fibula and tibiotalar joint. No definite acute osseous abnormality. Electronically Signed   By: Jasmine Pang M.D.   On: 11/15/2023 18:09   DG Foot Complete Left Result Date: 11/15/2023 CLINICAL DATA:  Larey Seat 2 weeks ago with left foot pain. EXAM: LEFT FOOT - COMPLETE 3+ VIEW COMPARISON:  Left ankle 01/30/2012 FINDINGS: Diffuse bone demineralization. Degenerative changes in the interphalangeal joints, metatarsal-phalangeal,  tarsometatarsal, and interphalangeal joints. Postoperative fusion of the ankle joint with plate and screw fixation. No evidence of acute fracture or dislocation. Soft tissues are unremarkable. IMPRESSION: 1. No acute bony abnormalities. 2. Degenerative changes in the left foot. 3. Postoperative changes with surgical fusion of the left ankle including plate and screw fixation Electronically Signed   By: Burman Nieves M.D.   On: 11/15/2023 18:03   DG Hip Unilat W or Wo Pelvis 2-3 Views Left Result Date: 11/15/2023 CLINICAL DATA:  Fall 2 weeks ago. Dizziness with fall forward. Left foot pain. EXAM: DG HIP (WITH OR WITHOUT PELVIS) 2-3V LEFT COMPARISON:  None Available. FINDINGS: Transverse subcapital fracture of the left femoral neck with varus angulation of the fracture fragments. No inter trochanteric involvement is identified. No dislocation at the hip joint. Pelvis appears intact. SI joints and symphysis pubis are not  displaced. No destructive or expansile bone lesions. Degenerative changes in the lower lumbar spine and both hips. Vascular calcifications. IMPRESSION: Acute transverse fracture of the left femoral neck with varus angulation of the fracture fragments. Degenerative changes. Electronically Signed   By: Burman Nieves M.D.   On: 11/15/2023 18:02     PHYSICAL EXAM  Temp:  [98.9 F (37.2 C)-101.2 F (38.4 C)] 98.9 F (37.2 C) (02/24 0800) Pulse Rate:  [75-99] 95 (02/24 0900) Resp:  [15-26] 18 (02/24 0900) BP: (85-135)/(52-76) 115/61 (02/24 0900) SpO2:  [97 %-100 %] 99 % (02/24 0900) Weight:  [58.9 kg-60 kg] 60 kg (02/24 0408)  General - well nourished, well developed, in no apparent distress.     Cardiovascular - regular rhythm and rate   Neuro -  Awake, calm. Does not follow commands, non verbal. CN: left pupil 3mm right pupil 2mm appears irregular but reactive. No forced gaze deviation.  NG tube in place.  Motor: right arm/leg spontanous mvt. Dense right hemiplegia. Mittens  on.  Sensory: withdraws to stimuli b/l.    ASSESSMENT/PLAN William Todd is a 73 y.o. male with PMH of DM, HTN, HLD, prostate cancer, left foot fracture with prosthesis, dementia and recent fall with acute transverse fracture of the left femoral neck s/p hemiarthroplasty on 2/2. Post op delirium/confusion but MRI at that time no acute finding, no stroke or hemorrhage. No sign of CAA. He was put on ASA 81 bid for 4 weeks for DVT prophylaxis. He was discharged to SNF on 11/20/23. Readmitted on 2/15 with fall at SNF and left eyebrow laceration. CT head showed left frontal small ICH with bilateral small SAH, concerning for traumatic ICH with contusion. However repeat CT 2/16 showed significant enlarged hematoma on the left frontal area with multi-lobe configuration and IVH and SAH but no hydro with 5mm MLS.     ICH:  left frontal large ICH, etiology unclear, traumatic ICH vs. Spontaneous ICH. Less likely for hemorrhagic infarct CT head 2/16 left frontal small ICH with bilateral small SAH, concerning for traumatic ICH with contusion.  Repeat CT 2/16 showed significant enlarged hematoma on the left frontal area with multi-lobe configuration and IVH and SAH but no hydro with 5mm MLS.    Repeat CT 2/17 Similar appearance of intraparenchymal hematoma in the left frontal lobe with associated mass effect and 5 mm rightward midline shift. CTA head and neck - no spot sign. 60% R ICA bulb, b/l distal VA mod to severe stenosis,  mod left VA origin stenosis and mild to mod b/l ICA siphon CT repeat 2/18 large left frontal ICH 108cc vs. 2/17 105cc but with MLS of 7-8 mm.  MRI with and without unchanged size of large left frontal intraparenchymal hematoma with 4 mm of anterior rightward midline shift. Blood layering within both lateral ventricle. Subarachnoid blood over both hemisphere.  CT repeat 2/20 stable hematoma and MLS 2D Echo  EF 60-65% EEG left frontal sharp waves,  moderate diffuse encephalopathy, no  seizure LDL 57 HgbA1c 6.1 SCDs for VTE prophylaxis aspirin 81 mg daily bid prior to admission, now on No antithrombotic.  Keppra 500 -> 750 mg bid>off. Ongoing aggressive stroke risk factor management Therapy recommendations:  SNF Disposition:  pending  Cerebral edema Repeat CT 2/17 5 mm rightward midline shift. CT repeat 2/18 MLS of 7-8 mm.  CT repeat 2/20 stable hematoma and midline shift MRI Brain 2/22, MLS of 4 mm Na 140-134-147-151-154-152-157-158 - 154 - 155>153 Put on 3% saline @ 50->  NS @ 50->25. 3% saline Held on 2/22 Allow Na to titrate down now that it's Day 8   Diabetes HgbA1c 6.1 goal < 7.0 Controlled CBG monitoring SSI DM education and close PCP follow up  Hypertension Stable on the high end On norvasc 10 and metoprolol 50 bid Off IV metoprolol BP goal < 160 Long term BP goal normotensive  Hyperlipidemia Home meds:  lipitor 40  LDL 57, goal < 70 Consider to resume lipitor on discharge  Fever Tmax 100.8 CXR 2/21 No active disease and UA neg. Close monitoring  AKI Creatinine 1.29--1.17--1.49--1.32--1.37-1.79(free water started) On tube feeding.  Monitoring BMP  Dysphagia Did not pass swallow Currently n.p.o. cortrak placed On tube feeding  Other Stroke Risk Factors Advanced age  Other Active Problems Anemia, hemoglobin 8.7--8.0--8.6--7.8--8.2, CBC monitoring Wife denies hx of dementia and stated that pt was functional before the fall earlier this month Recent fracture - fall with acute transverse fracture of the left femoral neck with varus angulation of the fracture fragments. Underwent hemiarthroplasty on 2/2.   Hospital day # 8  Exam is stable.  Allow Na to gradually decline. On FW 200 CC q 4hrs as Cr is elevated. Con't to monitor and can increase depending on next Na level. Follow Hg. Transfer out of ICU today.  Discussed with Dr. Denese Killings and ICU nurse.   This patient is critically ill due to ICH with edema and at significant risk  of neurological worsening, death form heart failure, respiratory failure, recurrent stroke, bleeding from [facility], seizure, sepsis. This patient's care requires constant monitoring of vital signs, hemodynamics, respiratory and cardiac monitoring, review of multiple databases, neurological assessment, discussion with family, other specialists and medical decision making of high complexity. I spent 35 minutes of neurocritical care time in the care of this patient.   Mikaylee Arseneau,MD    To contact Stroke Continuity provider, please refer to [email or web address]. After hours, contact General Neurology

## 2023-12-08 NOTE — TOC Initial Note (Signed)
 Transition of Care [facility]) - Initial/Assessment Note    Patient Details  Name: William Todd MRN: [id number] Date of Birth: 1951-07-25  [facility] CM/SW Contact:    Mearl Latin, LCSW Phone Number: 12/08/2023, 12:41 PM  Clinical Narrative:                 CSW continuing to follow for medical stability.   Expected Discharge Plan: Skilled Nursing Facility Barriers to Discharge: Continued Medical Work up   Patient Goals and CMS Choice            Expected Discharge Plan and Services In-house Referral: Clinical Social Work, Hospice / Palliative Care     Living arrangements for the past 2 months: Single Family Home, Skilled Nursing Facility                                      Prior Living Arrangements/Services Living arrangements for the past 2 months: Single Family Home, Skilled Nursing Facility Lives with:: Spouse Patient language and need for interpreter reviewed:: Yes Do you feel safe going back to the place where you live?: Yes      Need for Family Participation in Patient Care: Yes (Comment) Care giver support system in place?: Yes (comment)   Criminal Activity/Legal Involvement Pertinent to Current Situation/Hospitalization: No - Comment as needed  Activities of Daily Living   ADL Screening (condition at time of admission) Independently performs ADLs?: No Does the patient have a NEW difficulty with bathing/dressing/toileting/self-feeding that is expected to last >3 days?: Yes (Initiates electronic notice to provider for possible OT consult) Does the patient have a NEW difficulty with getting in/out of bed, walking, or climbing stairs that is expected to last >3 days?: Yes (Initiates electronic notice to provider for possible PT consult) Does the patient have a NEW difficulty with communication that is expected to last >3 days?: Yes (Initiates electronic notice to provider for possible SLP consult) Is the patient deaf or have difficulty  hearing?: No Does the patient have difficulty seeing, even when wearing glasses/contacts?: No Does the patient have difficulty concentrating, remembering, or making decisions?: Yes  Permission Sought/Granted Permission sought to share information with : Facility Medical sales representative, Family Supports Permission granted to share information with : No  Share Information with NAME: Chyrl Civatte  Permission granted to share info w AGENCY: [facility]  Permission granted to share info w Relationship: Spouse  Permission granted to share info w Contact Information: [phone]  Emotional Assessment Appearance:: Appears stated age Attitude/Demeanor/Rapport: Unable to Assess Affect (typically observed): Unable to Assess Orientation: :  (Disoriented x4) Alcohol / Substance Use: Not Applicable Psych Involvement: No (comment)  Admission diagnosis:  Subarachnoid hemorrhage (HCC) [I60.9] ICH (intracerebral hemorrhage) (HCC) [I61.9] Subarachnoid bleed (HCC) [I60.9] Intraparenchymal hemorrhage of brain (HCC) [I61.9] Fall, initial encounter [W19.XXXA] Patient Active Problem List   Diagnosis Date Noted   Protein-calorie malnutrition, severe 12/01/2023   Intraparenchymal hemorrhage of brain (HCC) 11/30/2023   Essential hypertension 11/30/2023   History of prostate cancer 11/30/2023   Non-insulin dependent type 2 diabetes mellitus (HCC) 11/30/2023   Normocytic anemia 11/30/2023   History of mood disorder 11/30/2023   HLD (hyperlipidemia) 11/30/2023   Subarachnoid hemorrhage (HCC) 11/30/2023   Acute metabolic encephalopathy 11/30/2023   ICH (intracerebral hemorrhage) (HCC) 11/30/2023   Malnutrition of moderate degree 11/19/2023   History of closed left femoral fracture s/p arthroplasty (HCC) 11/15/2023   Abnormal head  CT 11/15/2023   Long-term memory impairment 11/15/2023   Acute kidney injury superimposed on chronic kidney disease (HCC) 11/15/2023   Prostate cancer (HCC) 07/15/2013   PCP:  Knox Royalty, MD Pharmacy:   [facility] [phone] - [address] [place] - [address] [place] [address] Phone: [phone] Fax: [phone]  [id number] - [place] [facility] - [address] [place] [address] Phone: [phone] Fax: [phone]     Social Drivers of Health (SDOH) Social History: SDOH Screenings   Food Insecurity: No Food Insecurity (11/30/2023)  Housing: Low Risk  (11/30/2023)  Transportation Needs: No Transportation Needs (11/30/2023)  Utilities: Not At Risk (11/30/2023)  Social Connections: Moderately Integrated (11/30/2023)  Tobacco Use: Low Risk  (11/30/2023)   SDOH Interventions:     Readmission Risk Interventions    12/08/2023   12:40 PM  Readmission Risk Prevention Plan  Transportation Screening Complete  Medication Review (RN Care Manager) Complete  PCP or Specialist appointment within 3-5 days of discharge Complete  HRI or Home Care Consult Complete  SW Recovery Care/Counseling Consult Complete  Palliative Care Screening Complete  Skilled Nursing Facility Complete

## 2023-12-08 NOTE — Plan of Care (Signed)
  Problem: Activity: Goal: Risk for activity intolerance will decrease 12/08/2023 0554 by Leonia Reeves, RN Outcome: Progressing 12/08/2023 0554 by Leonia Reeves, RN Outcome: Progressing   Problem: Nutrition: Goal: Adequate nutrition will be maintained 12/08/2023 0554 by Leonia Reeves, RN Outcome: Progressing 12/08/2023 0554 by Leonia Reeves, RN Outcome: Progressing   Problem: Health Behavior/Discharge Planning: Goal: Ability to manage health-related needs will improve 12/08/2023 0554 by Leonia Reeves, RN Outcome: Progressing 12/08/2023 0554 by Leonia Reeves, RN Outcome: Progressing

## 2023-12-08 NOTE — Progress Notes (Signed)
 Occupational Therapy Discharge Patient Details Name: William Todd MRN: [id number] DOB: 06/11/1951 Today's Date: 12/08/2023 Time:  -     Patient discharged from OT services secondary to medical decline - will need to re-order OT to resume therapy services.  Please see latest therapy progress note for current level of functioning and progress toward goals.    Progress and discharge plan discussed with patient and/or caregiver: Patient unable to participate in discharge planning and no caregivers available  GO     Mateo Flow 12/08/2023, 2:06 PM

## 2023-12-08 NOTE — Progress Notes (Signed)
 eLink Physician-Brief Progress Note Patient Name: RATHANA VIVEROS DOB: 03-Apr-1951 MRN: [id number]   Date of Service  12/08/2023  HPI/Events of Note  Temp 101.2 Tylenol was discontinued. Reorder? - wbc normal. Mostly central, had yesterday also. ICB. - tylenol ordered.   BP soft throughout the night in the 90's. Now 119/58 (77). Can we have some parameters for the scheduled hydralazine ?  eICU Interventions  Hold hydralazine for once.     Intervention Category Intermediate Interventions: Other:  Ranee Gosselin 12/08/2023, 4:45 AM

## 2023-12-08 NOTE — Progress Notes (Signed)
 PT Cancellation Note  Patient Details Name: William Todd MRN: [id number] DOB: 1951/06/08   Cancelled Treatment:    Reason Eval/Treat Not Completed: PT screened, no needs identified, will sign off - Pt not following commands, withdrawals to pain only. Please reconsult if pt becomes more appropriate.    Marye Round, PT DPT Acute Rehabilitation Services Secure Chat Preferred  Office [phone]    Truddie Coco 12/08/2023, 2:00 PM

## 2023-12-08 NOTE — Progress Notes (Signed)
 NAME:  William Todd, MRN:  [id number], DOB:  08-14-1951, LOS: 8 ADMISSION DATE:  11/29/2023, CONSULTATION DATE:  11/29/2023 REFERRING MD:  Dr. Clayborne Dana - EDP, CHIEF COMPLAINT:  SAH with IPH   History of Present Illness:  William Todd is a 73 y.o. male with a past medical history significant for HTN, recurrent mechanical falls, protein calorie malnutrition, type 2 diabetes, chronic kidney disease, and prostate cancer 2014 who presented to the ED via EMS from [facility] after suffering ground-level fall at [facility].  Workup on admission revealed acute intraparenchymal hemorrhage of the left frontal lobe with multiple bilateral small volume SAH doubt shift, neurosurgery was consulted with no surgical interventions recommended.  Patient was admitted initially per [facility].   While boarding in the emergency department patient had repeat CT scan to follow-up on ICH which revealed progressive left frontal hematoma with intraventricular extension into the fourth ventricle resulting in anterior midline shift of 5 mm, no hydrocephalus.  Neurosurgery reviewed imaging and recommended admission to ICU under PCCM for close monitoring.   Pertinent  Medical History  HTN, recurrent mechanical falls, protein calorie malnutrition, type 2 diabetes, chronic kidney disease, and prostate cancer 2014   Significant Hospital Events: Including procedures, antibiotic start and stop dates in addition to other pertinent events   2/15 presented after ground-level fall from [facility] workup revealed ICH with bilateral [facility] 2/16 repeat CT while boarding in ED revealed progressive left frontal hematoma with intraventricular extension into the fourth ventricle resulting in anterior midline shift of 5 mm, no hydrocephalus.  Admission changed to ICU 2/18-CT angio, CT head with enlarging clot 2/20 - Stable left frontal hematoma with mild mass effect.  Interim History / Subjective:  NAEON  Objective   Blood pressure 109/61, pulse 84,  temperature 98.9 F (37.2 C), temperature source Axillary, resp. rate 16, height 6' (1.829 m), weight 60 kg, SpO2 100%.        Intake/Output Summary (Last 24 hours) at 12/08/2023 0922 Last data filed at 12/08/2023 0700 Gross per 24 hour  Intake 1908 ml  Output 1400 ml  Net 508 ml   Filed Weights   12/07/23 0428 12/07/23 1700 12/08/23 0408  Weight: 59.3 kg 58.9 kg 60 kg   Examination: General: Thin man. Lying in bed. In NAD HEENT: Moist oral mucosa,  Neuro: Opens eyes, does not follow any commands. Gets a bit agitated with stimulation, moves LUE aggressively. CV: RRR, no M/R/G PULM: Normal effort. CTAB GI: BS x 4, S/NT/ND Extremities: warm/dry, No edema  Skin: no rashes or lesions   Ancillary test personally reviewed  2/20 Large left frontal intraparenchymal hemorrhage of different densities.  Some subarachnoid bleeding.  Imaging stable from prior study. Sodium 155.  Creatinine 1.40 Glycemic control has improved.  Anemia of chronic disease 8.4 MRI shows stable hemorrhage.  KUB - SBFT in stomach, possible ileus Assessment & Plan:   Left frontal intracerebral traumatic hematoma. Bilateral subarachnoid hemorrhages secondary to trauma - s/p 7 day course prophylactic Keppra. History of mood disorder, memory impairment. - Exam remains unchanged. Lack of cooperation may be due to frontal lobe bleed causing abulia.  - Continue supportive care. - Continue Amantadine.  CKD stage IIIb. Hypernatremia - was on  - Add 500cc LR bolus. - Continue FWF. - Follow BMP.  Fever - ? Central. No obvious source infection currently. WBC ok. - Add PCT.  Hypertension. - Continue Amlodipine, Lopressor. - C/c Hydralazine scheduled as BP has been a bit soft. - Hydralazine PRN, Labetalol  PRN. - Goal SBP < 160.  Type 2 diabetes. - SSI.  History of frailty with recurrent mechanical falls. - PT/OT when able.  Constipation - Add tap water enema today. - Continue Senna, Miralax, Reglan, FWF  per tube.  Nutrition. - Continue TF's.  Can transfer out of ICU. Will ask [facility] to assume care in AM 2/25 with PCCM off.  Best Practice (right click and "Reselect all SmartList Selections" daily)   Diet/type: On tube feeds via smallbore feeding tube. DVT prophylaxis prophylactic heparin  Pressure ulcer(s): N/A GI prophylaxis: PPI Lines: N/A Foley:  N/A Code Status:  full code Last date of multidisciplinary goals of care discussion: family updated at the bedside 2/23 - informed that road to recovery will be slow.     Rutherford Guys, PA - C  Pulmonary & Critical Care Medicine For pager details, please see AMION or use Epic chat  After 1900, please call [facility] for cross coverage needs 12/08/2023, 10:06 AM

## 2023-12-08 NOTE — Progress Notes (Signed)
 SLP Cancellation Note  Patient Details Name: William Todd MRN: [id number] DOB: 1951-04-24   Cancelled treatment:       Reason Eval/Treat Not Completed: Medical issues which prohibited therapy. Per conversation with RN, pt still not at a level to attempt POs. Given number of consecutive cancels, discussed SLP s/o for now and medical team can reconsult when he is more appropriate. RN in agreement.     Mahala Menghini., M.A. CCC-SLP Acute Rehabilitation Services Office [phone]  Secure chat preferred  12/08/2023, 10:35 AM

## 2023-12-08 NOTE — Plan of Care (Signed)
  Problem: Education: Goal: Ability to describe self-care measures that may prevent or decrease complications (Diabetes Survival Skills Education) will improve Outcome: Not Progressing

## 2023-12-09 ENCOUNTER — Inpatient Hospital Stay (HOSPITAL_COMMUNITY): Payer: Medicare HMO

## 2023-12-09 DIAGNOSIS — I609 Nontraumatic subarachnoid hemorrhage, unspecified: Secondary | ICD-10-CM | POA: Diagnosis not present

## 2023-12-09 LAB — GLUCOSE, CAPILLARY
Glucose-Capillary: 141 mg/dL — ABNORMAL HIGH (ref 70–99)
Glucose-Capillary: 178 mg/dL — ABNORMAL HIGH (ref 70–99)
Glucose-Capillary: 187 mg/dL — ABNORMAL HIGH (ref 70–99)
Glucose-Capillary: 190 mg/dL — ABNORMAL HIGH (ref 70–99)
Glucose-Capillary: 189 mg/dL — ABNORMAL HIGH (ref 70–99)

## 2023-12-09 LAB — MAGNESIUM: Magnesium: 2.4 mg/dL (ref 1.7–2.4)

## 2023-12-09 LAB — BASIC METABOLIC PANEL
Anion gap: 4 — ABNORMAL LOW (ref 5–15)
BUN: 34 mg/dL — ABNORMAL HIGH (ref 8–23)
CO2: 28 mmol/L (ref 22–32)
Calcium: 8.3 mg/dL — ABNORMAL LOW (ref 8.9–10.3)
Chloride: 117 mmol/L — ABNORMAL HIGH (ref 98–111)
Creatinine, Ser: 1.45 mg/dL — ABNORMAL HIGH (ref 0.61–1.24)
GFR, Estimated: 51 mL/min — ABNORMAL LOW (ref >60)
Glucose, Bld: 174 mg/dL — ABNORMAL HIGH (ref 70–99)
Potassium: 4 mmol/L (ref 3.5–5.1)
Sodium: 149 mmol/L — ABNORMAL HIGH (ref 135–145)

## 2023-12-09 LAB — PHOSPHORUS: Phosphorus: 3.7 mg/dL (ref 2.5–4.6)

## 2023-12-09 LAB — URINALYSIS, ROUTINE W REFLEX MICROSCOPIC
Bilirubin Urine: NEGATIVE
Glucose, UA: NEGATIVE mg/dL
Ketones, ur: NEGATIVE mg/dL
Leukocytes,Ua: NEGATIVE
Nitrite: NEGATIVE
Protein, ur: 30 mg/dL — AB
Specific Gravity, Urine: 1.018 (ref 1.005–1.030)
pH: 7 (ref 5.0–8.0)

## 2023-12-09 LAB — CBC
HCT: 25.5 % — ABNORMAL LOW (ref 39.0–52.0)
Hemoglobin: 8.1 g/dL — ABNORMAL LOW (ref 13.0–17.0)
MCH: 30.2 pg (ref 26.0–34.0)
MCHC: 31.8 g/dL (ref 30.0–36.0)
MCV: 95.1 fL (ref 80.0–100.0)
Platelets: 131 10*3/uL — ABNORMAL LOW (ref 150–400)
RBC: 2.68 MIL/uL — ABNORMAL LOW (ref 4.22–5.81)
RDW: 15 % (ref 11.5–15.5)
WBC: 8 10*3/uL (ref 4.0–10.5)
nRBC: 0.2 % (ref 0.0–0.2)

## 2023-12-09 MED ORDER — AMLODIPINE BESYLATE 5 MG PO TABS
5.0000 mg | ORAL_TABLET | Freq: Every day | ORAL | Status: DC
  Administered 2023-12-10 – 2024-01-01 (×22): 5 mg
  Filled 2023-12-09 (×22): qty 1

## 2023-12-09 NOTE — Progress Notes (Signed)
 STROKE TEAM PROGRESS NOTE   SUBJECTIVE (INTERVAL HISTORY)    Na is 149.  Off hypertonic.  No events overnight.  On Tfs.   OBJECTIVE Temp:  [98.8 F (37.1 C)-100.6 F (38.1 C)] 100.2 F (37.9 C) (02/25 0800) Pulse Rate:  [66-95] 88 (02/25 0700) Cardiac Rhythm: Normal sinus rhythm (02/24 2000) Resp:  [13-29] 16 (02/25 0700) BP: (101-134)/(51-74) 118/54 (02/25 0700) SpO2:  [98 %-100 %] 99 % (02/25 0700) Weight:  [60.8 kg] 60.8 kg (02/25 0400)  Recent Labs  Lab 12/08/23 1542 12/08/23 1954 12/08/23 2345 12/09/23 0401 12/09/23 0754  GLUCAP 157* 229* 164* 141* 178*   Recent Labs  Lab 12/03/23 0456 12/03/23 0929 12/05/23 0822 12/05/23 1555 12/06/23 0415 12/06/23 0748 12/07/23 0152 12/07/23 0848 12/07/23 1308 12/08/23 0525 12/09/23 0451  NA 147*   < > 154*   < > 158*   < > 154* 155* 155* 153* 149*  K 3.7   < > 4.2  --  3.8  --   --  3.6  --  4.1 4.0  CL 112*   < > 121*  --  118*  --   --  118*  --  118* 117*  CO2 22   < > 27  --  27  --   --  27  --  27 28  GLUCOSE 173*   < > 284*  --  142*  --   --  163*  --  202* 174*  BUN 16   < > 25*  --  29*  --   --  34*  --  41* 34*  CREATININE 1.49*   < > 1.37*  --  1.47*  --   --  1.40*  --  1.79* 1.45*  CALCIUM 9.2   < > 8.7*  --  9.5  --   --  9.0  --  8.6* 8.3*  MG 1.8  --  2.1  --   --   --   --   --   --   --  2.4  PHOS 3.0  --   --   --   --   --   --   --   --   --  3.7   < > = values in this interval not displayed.   No results for input(s): "AST", "ALT", "ALKPHOS", "BILITOT", "PROT", "ALBUMIN" in the last 168 hours.  Recent Labs  Lab 12/03/23 0456 12/04/23 0431 12/05/23 0822 12/06/23 0415 12/09/23 0451  WBC 7.7 6.8 9.1 9.4 8.0  HGB 8.6* 7.8* 8.2* 8.4* 8.1*  HCT 26.1* 24.2* 25.9* 27.0* 25.5*  MCV 92.2 93.4 95.9 95.1 95.1  PLT 156 145* 137* 137* 131*   No results for input(s): "CKTOTAL", "CKMB", "CKMBINDEX", "TROPONINI" in the last 168 hours. No results for input(s): "LABPROT", "INR" in the last 72  hours.       Component Value Date/Time   CHOL 111 12/02/2023 0624   TRIG 63 12/02/2023 0624   HDL 41 12/02/2023 0624   CHOLHDL 2.7 12/02/2023 0624   VLDL 13 12/02/2023 0624   LDLCALC 57 12/02/2023 0624   Lab Results  Component Value Date   HGBA1C 6.1 (H) 12/02/2023    DG Abd Portable 1V Result Date: 12/06/2023 CLINICAL DATA:  Nasogastric tube placement. EXAM: PORTABLE ABDOMEN - 1 VIEW COMPARISON:  December 03, 2023. FINDINGS: Distal tip of feeding tube is seen in expected position of proximal stomach. IMPRESSION: Distal tip of feeding tube is seen in expected  position of proximal stomach. Electronically Signed   By: Lupita Raider M.D.   On: 12/06/2023 09:19   DG CHEST PORT 1 VIEW Result Date: 12/05/2023 CLINICAL DATA:  Fever EXAM: PORTABLE CHEST 1 VIEW COMPARISON:  11/29/2023 FINDINGS: Enteric tube tip at the distal esophagus. Lung fields are clear. Cardiac size is normal. Aortic atherosclerosis. IMPRESSION: No active disease. Enteric tube tip at the distal esophagus, recommend advancement for more optimal positioning. Electronically Signed   By: Jasmine Pang M.D.   On: 12/05/2023 21:13   MR BRAIN W WO CONTRAST Result Date: 12/05/2023 CLINICAL DATA:  Hemorrhagic stroke EXAM: MRI HEAD WITHOUT AND WITH CONTRAST TECHNIQUE: Multiplanar, multiecho pulse sequences of the brain and surrounding structures were obtained without and with intravenous contrast. CONTRAST:  6mL GADAVIST GADOBUTROL 1 MMOL/ML IV SOLN COMPARISON:  Head CT 12/04/2023 FINDINGS: Brain: Unchanged size of large left frontal intraparenchymal hematoma. Blood layering within both lateral ventricles. Subarachnoid blood over both hemispheres. There is multifocal hyperintense T2-weighted signal within the white matter. Generalized volume loss. There is 4 mm of anterior rightward midline shift. The midline structures are normal. There is no abnormal contrast enhancement. Vascular: Normal flow voids. Skull and upper cervical spine:  Normal calvarium and skull base. Visualized upper cervical spine and soft tissues are normal. Sinuses/Orbits:No paranasal sinus fluid levels or advanced mucosal thickening. No mastoid or middle ear effusion. Normal orbits. IMPRESSION: 1. Unchanged size of large left frontal intraparenchymal hematoma with 4 mm of anterior rightward midline shift. 2. Blood layering within both lateral ventricles. 3. Subarachnoid blood over both hemispheres. Electronically Signed   By: Deatra Robinson M.D.   On: 12/05/2023 19:24   CT HEAD WO CONTRAST ( ) Result Date: 12/04/2023 CLINICAL DATA:  73 year old male with multifocal intracranial hemorrhage after a fall. Subsequent encounter. EXAM: CT HEAD WITHOUT CONTRAST TECHNIQUE: Contiguous axial images were obtained from the base of the skull through the vertex without intravenous contrast. RADIATION DOSE REDUCTION: This exam was performed according to the departmental dose-optimization program which includes automated exposure control, adjustment of the mA and/or kV according to patient size and/or use of iterative reconstruction technique. COMPARISON:  12/02/2023 and earlier. FINDINGS: Brain: Left anterior frontal lobe intra-axial hemorrhage remains hyperdense and amorphous. Due to scan angulation the most direct comparison now is on sagittal images (series 6, image 39 today versus the same series and image 2 days ago), where hemorrhage size and configuration appear unchanged. Regional edema and mass effect also unchanged. Scattered bilateral subarachnoid blood has not significantly changed. Small volume layering intraventricular hemorrhage has not significantly changed. Stable ventricle size and configuration, no ventriculomegaly. Stable mild rightward midline shift limited to the anterior septum pellucidum. Basilar cisterns are stable. Stable gray-white matter differentiation throughout the brain. Left frontal lobe edema surrounding the hematoma and chronic right PCA territory  infarct with encephalomalacia. Vascular: No suspicious intracranial vascular hyperdensity. Skull: Intact, stable. Incidental large maxillary torus exostoses incidentally noted. Sinuses/Orbits: Left nasoenteric tube now in place. Visualized paranasal sinuses and mastoids are stable and well aerated. Other: Stable orbit and scalp soft tissues. IMPRESSION: 1. Continued stability of large anterior left frontal lobar hemorrhage, associated edema. Relatively mild intracranial mass effect, scattered small volume bilateral SAH, and IVH remain stable. 2. No new intracranial abnormality. Chronic right PCA territory infarct. Electronically Signed   By: Odessa Fleming M.D.   On: 12/04/2023 06:37   EEG adult Result Date: 12/03/2023 William Quest, MD     12/03/2023  4:09 PM Patient Name: William Todd  MRN: [id number] Epilepsy Attending: Charlsie Todd Referring Physician/Provider: Elmer Picker, NP Date: 12/03/2023 Duration: 23.45 mins Patient history: 72yo M with left frontal ICH. EEG to evaluate for seizure Level of alertness: Awake, asleep AEDs during EEG study: LEV Technical aspects: This EEG study was done with scalp electrodes positioned according to the 10-20 International system of electrode placement. Electrical activity was reviewed with band pass filter of 1-70Hz , sensitivity of 7 uV/mm, display speed of 46mm/sec with a 60Hz  notched filter applied as appropriate. EEG data were recorded continuously and digitally stored.  Video monitoring was available and reviewed as appropriate. Description: No clear posterior dominant rhythm was seen. Sleep was characterized by sleep spindles (12 to 14 Hz), maximal frontocentral region. EEG showed continuous generalized and maximal left frontal 3 to 6 Hz theta-delta slowing. Sharp waves were noted in left frontal region,at times periodic at 1hz . Hyperventilation and photic stimulation were not performed.   ABNORMALITY - Sharp wave, left frontal region - Continuous slow, generalized  and maximal left frontal region IMPRESSION: This study showed evidence of epileptogenicity and cortical dysfunction arising from left frontal region likely secondary to underlying ICH. Additionally there is moderate diffuse encephalopathy. No seizures were seen throughout the recording. If suspicion for ictal activity remains a concern, a prolonged study can be considered. William Todd   DG Abd Portable 1V Result Date: 12/03/2023 CLINICAL DATA:  73 year old male feeding tube placement. EXAM: PORTABLE ABDOMEN - 1 VIEW COMPARISON:  12/01/2023 and earlier. FINDINGS: Portable AP semi upright view at 1038 hours. Enteric feeding tube is in place within the proximal stomach, looped at the level of the gastric fundus. Negative lung bases. Nonobstructed bowel gas pattern. No acute osseous abnormality identified. IMPRESSION: Enteric feeding tube placed into the proximal stomach. Electronically Signed   By: Odessa Fleming M.D.   On: 12/03/2023 11:19   CT HEAD WO CONTRAST ( ) Result Date: 12/02/2023 CLINICAL DATA:  73 year old male with multifocal intracranial hemorrhage status post fall. Hemorrhage progression after presentation. EXAM: CT HEAD WITHOUT CONTRAST TECHNIQUE: Contiguous axial images were obtained from the base of the skull through the vertex without intravenous contrast. RADIATION DOSE REDUCTION: This exam was performed according to the departmental dose-optimization program which includes automated exposure control, adjustment of the mA and/or kV according to patient size and/or use of iterative reconstruction technique. COMPARISON:  CTA head and neck this morning. Head CT yesterday and earlier. FINDINGS: Brain: Large volume mixed density but mostly hyperdense intra-axial hemorrhage in the anterior left frontal lobe encompasses 78 by 44 x 63 mm (AP by transverse by CC) versus 79 x 45 x 61 mm yesterday when measured with the same technique. Volume estimated at 108 mL, versus 105 mL on 11/30/2023, and  hemorrhage size and configuration appears visually stable. Superimposed scattered bilateral subarachnoid blood and a moderate volume of mostly now layering lateral intraventricular blood also appears stable. Rightward midline shift at the anterior septum pellucidum of 7-8 mm has increased from 5-6 mm on 11/30/2023. Ventricle size and configuration is stable. Stable basilar cistern patency. Superimposed chronic right PCA territory encephalomalacia and confluent bilateral cerebral white matter hypodensity. Vascular: Calcified atherosclerosis at the skull base. No suspicious intracranial vascular hyperdensity. Skull: Stable, intact. Sinuses/Orbits: Visualized paranasal sinuses and mastoids are stable and well aerated. Other: Stable, negative orbit and scalp soft tissues. IMPRESSION: 1. Large volume anterior frontal lobe intra-axial hemorrhage does not appear significantly changed in size or configuration since 11/30/2023. Using largest orthogonal dimensions, estimated blood volume is 108 mL now vs.  105 mL then. 2. Intracranial mass effect with mildly increased rightward midline shift since 11/30/2023, now 7-8 mm. Basilar cisterns remain patent. 3. Stable bilateral SAH and IVH. Stable ventricle size and configuration. 4. Chronic right PCA territory infarct. Nonspecific white matter changes. Electronically Signed   By: Odessa Fleming M.D.   On: 12/02/2023 11:47   CT ANGIO HEAD NECK W WO CM Result Date: 12/02/2023 CLINICAL DATA:  73 year old male with multifocal intracranial hemorrhage status post fall. EXAM: CT ANGIOGRAPHY HEAD AND NECK TECHNIQUE: Multidetector CT imaging of the head and neck was performed using the standard protocol during bolus administration of intravenous contrast. Multiplanar CT image reconstructions and MIPs were obtained to evaluate the vascular anatomy. Carotid stenosis measurements (when applicable) are obtained utilizing NASCET criteria, using the distal internal carotid diameter as the  denominator. RADIATION DOSE REDUCTION: This exam was performed according to the departmental dose-optimization program which includes automated exposure control, adjustment of the mA and/or kV according to patient size and/or use of iterative reconstruction technique. CONTRAST:  75mL OMNIPAQUE IOHEXOL 350 MG/ML SOLN COMPARISON:  Head CTs 12/01/2023 and earlier. Brain MRI 11/15/2023. FINDINGS: CTA NECK Skeleton: Cervical spine degeneration. 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 No acute osseous abnormality identified. Upper chest: Upper lungs are clear. Negative visible superior mediastinum. Other neck: Neck soft tissue spaces appear within normal limits. Aortic arch: Calcified aortic atherosclerosis.  3 vessel arch. Right carotid system: No significant brachiocephalic artery or or right CCA origin atherosclerosis. Soft and calcified plaque of the right CCA before the bifurcation without stenosis. Bulky calcified atherosclerosis of the proximal right ICA beginning at the origin but most pronounced at the bulb. Subsequent distal bulb stenosis is numerically estimated at 60 % with respect to the distal vessel (series 7, image 98 and series 9, image 155). Right ICA remains patent to the skull base. Left carotid system: Minimal left CCA atherosclerosis before the bifurcation. Soft and calcified plaque at the left ICA origin and more pronounced at the bulb. Up to 50 % stenosis with respect to the distal vessel. Additional calcified plaque of the left ICA just below the skull base without stenosis. Vertebral arteries: Mild proximal right subclavian atherosclerosis without stenosis. Calcified plaque at the right vertebral artery origin with only mild stenosis on series 8, image 145. Right vertebral is patent to the skull base with no significant plaque or stenosis. Proximal left subclavian artery soft and calcified plaque without stenosis. Mostly soft plaque at the left  vertebral artery origin with up to moderate origin stenosis on series 8, image 135. Left vertebral remains patent and is fairly codominant in the neck. Mild left V3 segment calcified plaque with no additional significant stenosis to the skull base. CTA HEAD Posterior circulation: Bilateral V4 segment atherosclerosis. Bulky and severe right V4 calcified plaque and stenosis on series 5, image 145. The right vertebrobasilar junction remains patent. Contralateral moderate left V4 segment stenosis proximal to the left PICA origin which remains normal. Right PICA origin is patent. Patent left vertebrobasilar junction. Patent basilar artery without stenosis. Fetal type bilateral PCA origins. Patent SCA origins. Bilateral PCA branches are within normal limits. Anterior circulation: Both ICA siphons are patent. Left siphon cavernous segment calcified plaque is moderate with mild to moderate stenosis at the anterior genu. Normal left posterior communicating artery origin. Right ICA cavernous through supraclinoid segment calcified plaque is moderate with mild to moderate supraclinoid stenosis. Normal right posterior communicating artery origin. Patent carotid termini. Patent MCA origins. Dominant right and  diminutive or absent left ACA A1 segments. Anterior communicating artery and bilateral ACA branches are within normal limits. Left MCA M1 segment and bifurcation are patent without stenosis. There is mild mass effect on the left MCA branches which otherwise appear within normal limits. Right MCA M1 segment and trifurcation are patent without stenosis. Right MCA branches are within normal limits. No CTA spot sign or abnormal vascularity associated with the left anterior superior frontal lobe intra-axial hemorrhage. Other findings: Grossly stable intracranial hemorrhage, mass effect from the head CT yesterday. Venous sinuses: Early contrast timing. Superior sagittal sinus, torcula, straight sinus, vein of Galen and internal  cerebral veins, dominant appearing right transverse and sigmoid venous sinuses appear to be enhancing and patent. Anatomic variants: Fetal type bilateral PCA origins. Dominant right ACA A1. Review of the MIP images confirms the above findings IMPRESSION: 1. Negative for CTA spot sign in association with acute intracranial hemorrhage. Mild mass effect on left MCA vasculature. Negative for intracranial aneurysm, vascular malformation, or large vessel occlusion. 2. Atherosclerosis in the head and neck notable for: - 60% stenosis of the Right ICA bulb. - Moderate To Severe bilateral distal vertebral artery stenosis. - Moderate Left Vertebral Artery origin stenosis. - mild to moderate bilateral ICA siphon stenosis. 3.  Aortic Atherosclerosis (ICD10-I70.0). Electronically Signed   By: Odessa Fleming M.D.   On: 12/02/2023 06:41   DG Abd Portable 1V Result Date: 12/01/2023 CLINICAL DATA:  NG placement. EXAM: PORTABLE ABDOMEN - 1 VIEW COMPARISON:  CT abdomen pelvis dated 07/01/2013. FINDINGS: Feeding tube with tip in the left upper abdomen likely in the region of the gastric fundus. Large amount of stool throughout the colon. IMPRESSION: Feeding tube with tip in the region of the gastric fundus. Electronically Signed   By: Elgie Collard M.D.   On: 12/01/2023 18:55   CT HEAD WO CONTRAST ( ) Result Date: 12/01/2023 CLINICAL DATA:  Follow-up of intracranial hemorrhage. EXAM: CT HEAD WITHOUT CONTRAST TECHNIQUE: Contiguous axial images were obtained from the base of the skull through the vertex without intravenous contrast. RADIATION DOSE REDUCTION: This exam was performed according to the departmental dose-optimization program which includes automated exposure control, adjustment of the mA and/or kV according to patient size and/or use of iterative reconstruction technique. COMPARISON:  None Available. FINDINGS: Brain: Redemonstrated intraparenchymal hematoma in the anterior left frontal lobe which measures 7.9 x 4.9 x 3.6  cm, previously measuring 7.8 x 5.2 x 3.4 cm when remeasured in a similar manner. Similar surrounding edema and associated mass effect. Approximately 5 mm rightward midline shift anteriorly similar to prior. Extension of hemorrhage into the adjacent left frontal horn with similar appearance of blood products in the left lateral ventricle. Increased blood products within the atrium and occipital horn of the right lateral ventricle. Slightly decreased blood products in the third ventricle. Small focus of subdural hemorrhage along the anterior falx. Scattered areas of subarachnoid hemorrhage along the right frontal lobe and right sylvian fissure again noted. Remote infarct in the right occipital lobe. Vascular: No hyperdense vessel or unexpected calcification. Skull: Normal. Negative for fracture or focal lesion. Sinuses/Orbits: No acute finding. Other: Mastoid air cells are clear. IMPRESSION: Similar appearance of intraparenchymal hematoma in the left frontal lobe with associated mass effect and 5 mm rightward midline shift. Additional small multi compartment intracranial hemorrhage as above. Slightly increased blood products within the atrium and occipital horn of the right lateral ventricle. Electronically Signed   By: Emily Filbert M.D.   On: 12/01/2023 13:34  ECHOCARDIOGRAM COMPLETE Result Date: 11/30/2023    ECHOCARDIOGRAM REPORT   Patient Name:   William Todd Date of Exam: 11/30/2023 Medical Rec #:  [id number]        Height:       72.0 in Accession #:    [id number]       Weight:       147.0 lb Date of Birth:  04-21-1951         BSA:          1.869 m Patient Age:    72 years         BP:           129/68 mmHg Patient Gender: M                HR:           77 bpm. Exam Location:  Inpatient Procedure: 2D Echo (Both Spectral and Color Flow Doppler were utilized during            procedure). Indications:    intracerebral hemorrhage  History:        Patient has prior history of Echocardiogram examinations, most                  recent 01/02/2013. Risk Factors:Hypertension, Diabetes and                 Dyslipidemia.  Sonographer:    Delcie Roch RDCS Referring Phys: [phone] WHITNEY D HARRIS IMPRESSIONS  1. Left ventricular ejection fraction, by estimation, is 60 to 65%. The left ventricle has normal function. The left ventricle has no regional wall motion abnormalities. Left ventricular diastolic parameters were normal.  2. Right ventricular systolic function is normal. The right ventricular size is normal. There is mildly elevated pulmonary artery systolic pressure.  3. The mitral valve is degenerative. Mild mitral valve regurgitation. No evidence of mitral stenosis.  4. The aortic valve is tricuspid. There is mild calcification of the aortic valve. Aortic valve regurgitation is not visualized. Aortic valve sclerosis is present, with no evidence of aortic valve stenosis.  5. The inferior vena cava is normal in size with greater than 50% respiratory variability, suggesting right atrial pressure of 3 mmHg. FINDINGS  Left Ventricle: Left ventricular ejection fraction, by estimation, is 60 to 65%. The left ventricle has normal function. The left ventricle has no regional wall motion abnormalities. Strain imaging was not performed. The left ventricular internal cavity  size was normal in size. There is no left ventricular hypertrophy. Left ventricular diastolic parameters were normal. Right Ventricle: The right ventricular size is normal. No increase in right ventricular wall thickness. Right ventricular systolic function is normal. There is mildly elevated pulmonary artery systolic pressure. The tricuspid regurgitant velocity is 2.89  m/s, and with an assumed right atrial pressure of 3 mmHg, the estimated right ventricular systolic pressure is 36.4 mmHg. Left Atrium: Left atrial size was normal in size. Right Atrium: Right atrial size was normal in size. Pericardium: There is no evidence of pericardial effusion. Mitral Valve: The  mitral valve is degenerative in appearance. There is mild calcification of the anterior mitral valve leaflet(s). Mild mitral valve regurgitation. No evidence of mitral valve stenosis. Tricuspid Valve: The tricuspid valve is grossly normal. Tricuspid valve regurgitation is mild . No evidence of tricuspid stenosis. Aortic Valve: The aortic valve is tricuspid. There is mild calcification of the aortic valve. Aortic valve regurgitation is not visualized. Aortic valve sclerosis is present, with no evidence of  aortic valve stenosis. Pulmonic Valve: The pulmonic valve was grossly normal. Pulmonic valve regurgitation is not visualized. No evidence of pulmonic stenosis. Aorta: The aortic root and ascending aorta are structurally normal, with no evidence of dilitation. Venous: The inferior vena cava is normal in size with greater than 50% respiratory variability, suggesting right atrial pressure of 3 mmHg. IAS/Shunts: The atrial septum is grossly normal. Additional Comments: 3D imaging was not performed.  LEFT VENTRICLE PLAX 2D LVIDd:         3.20 cm   Diastology LVIDs:         1.70 cm   LV e' medial:    8.16 cm/s LV PW:         1.20 cm   LV E/e' medial:  10.7 LV IVS:        1.00 cm   LV e' lateral:   11.20 cm/s LVOT diam:     2.40 cm   LV E/e' lateral: 7.8 LV SV:         115 LV SV Index:   61 LVOT Area:     4.52 cm  RIGHT VENTRICLE             IVC RV Basal diam:  2.80 cm     IVC diam: 1.70 cm RV S prime:     12.90 cm/s TAPSE (M-mode): 2.2 cm LEFT ATRIUM             Index        RIGHT ATRIUM           Index LA diam:        3.30 cm 1.77 cm/m   RA Area:     13.00 cm LA Vol (A2C):   45.6 ml 24.39 ml/m  RA Volume:   29.50 ml  15.78 ml/m LA Vol (A4C):   39.1 ml 20.92 ml/m LA Biplane Vol: 43.8 ml 23.43 ml/m  AORTIC VALVE LVOT Vmax:   125.00 cm/s LVOT Vmean:  83.200 cm/s LVOT VTI:    0.254 m  AORTA Ao Root diam: 3.30 cm Ao Asc diam:  3.20 cm MITRAL VALVE               TRICUSPID VALVE MV Area (PHT): 3.37 cm    TR Peak grad:    33.4 mmHg MV Decel Time: 225 msec    TR Vmax:        289.00 cm/s MR Peak grad: 113.2 mmHg MR Mean grad: 73.0 mmHg    SHUNTS MR Vmax:      532.00 cm/s  Systemic VTI:  0.25 m MR Vmean:     403.0 cm/s   Systemic Diam: 2.40 cm MV E velocity: 87.30 cm/s MV A velocity: 70.40 cm/s MV E/A ratio:  1.24 Lennie Odor MD Electronically signed by Lennie Odor MD Signature Date/Time: 11/30/2023/8:57:24 PM    Final    CT Head Wo Contrast Result Date: 11/30/2023 CLINICAL DATA:  Follow-up head trauma EXAM: CT HEAD WITHOUT CONTRAST TECHNIQUE: Contiguous axial images were obtained from the base of the skull through the vertex without intravenous contrast. RADIATION DOSE REDUCTION: This exam was performed according to the departmental dose-optimization program which includes automated exposure control, adjustment of the mA and/or kV according to patient size and/or use of iterative reconstruction technique. COMPARISON:  Head CT from the same day at 2 a.m. FINDINGS: Brain: Growing hematoma in the anterior left frontal lobe, hemorrhagic area measuring 7.3 x 5 x 3.4 cm. The hemorrhage has now decompressed into the left  lateral ventricle with tracking to the level of the fourth ventricle. No hydrocephalus. Patchy subarachnoid hemorrhage along the frontal lobes and sylvian fissures which appears similar. The hemorrhage and edema causes progressive swelling with anterior midline shift measuring 5 mm. Chronic right occipital infarct. Low-density in the cerebral white matter from chronic small vessel ischemia. Vascular: No hyperdense vessel or unexpected calcification. Skull: No acute finding Sinuses/Orbits: No evidence of injury Critical Value/emergent results were called by telephone at the time of interpretation on 11/30/2023 at 8:45 am to provider [place], who verbally acknowledged these results. IMPRESSION: Progressive left frontal hematoma now measuring 60 cc with new intraventricular extension reaching the fourth ventricle. Patchy  bilateral subarachnoid hemorrhage appears similar to prior. The growing hematoma and edema causes anterior midline shift by 5 mm. No hydrocephalus. Electronically Signed   By: Tiburcio Pea M.D.   On: 11/30/2023 08:46   CT Head Wo Contrast Result Date: 11/30/2023 CLINICAL DATA:  Head trauma, minor (Age >= 65y); Neck trauma (Age >= 65y) EXAM: CT HEAD WITHOUT CONTRAST CT CERVICAL SPINE WITHOUT CONTRAST TECHNIQUE: Multidetector CT imaging of the head and cervical spine was performed following the standard protocol without intravenous contrast. Multiplanar CT image reconstructions of the cervical spine were also generated. RADIATION DOSE REDUCTION: This exam was performed according to the departmental dose-optimization program which includes automated exposure control, adjustment of the mA and/or kV according to patient size and/or use of iterative reconstruction technique. COMPARISON:  MRI head November 15, 2023. FINDINGS: CT HEAD FINDINGS Brain: Acute intraparenchymal hemorrhage in the left frontal lobe measuring 2.6 cm with overlying extra-axial extension. Small volume of multifocal subarachnoid hemorrhage bilaterally along the right frontal convexity, left frontal convexity and layering within the left sylvian fissure. No midline shift. No hydrocephalus. Remote right occipital infarct. Vascular: Calcific atherosclerosis.  No dense vessel. Skull: No acute fracture. Sinuses/Orbits: Clear sinuses.  No acute orbital findings. Other: No mastoid effusions. CT CERVICAL SPINE FINDINGS Alignment: No substantial sagittal subluxation. Skull base and vertebrae: No acute fracture. Soft tissues and spinal canal: No prevertebral fluid or swelling. No visible canal hematoma. Disc levels:  Moderate multilevel degenerative change. Upper chest: Visualized lung apices are clear. IMPRESSION: 1. Acute intraparenchymal hemorrhage in left frontal lobe with multifocal bilateral small volume subarachnoid hemorrhage. No midline shift. 2.  No acute fracture or traumatic malalignment in the cervical spine. Findings discussed with Dr. Clayborne Dana via telephone at 2:19 a.m. Electronically Signed   By: Feliberto Harts M.D.   On: 11/30/2023 02:21   CT Cervical Spine Wo Contrast Result Date: 11/30/2023 CLINICAL DATA:  Head trauma, minor (Age >= 65y); Neck trauma (Age >= 65y) EXAM: CT HEAD WITHOUT CONTRAST CT CERVICAL SPINE WITHOUT CONTRAST TECHNIQUE: Multidetector CT imaging of the head and cervical spine was performed following the standard protocol without intravenous contrast. Multiplanar CT image reconstructions of the cervical spine were also generated. RADIATION DOSE REDUCTION: This exam was performed according to the departmental dose-optimization program which includes automated exposure control, adjustment of the mA and/or kV according to patient size and/or use of iterative reconstruction technique. COMPARISON:  MRI head November 15, 2023. FINDINGS: CT HEAD FINDINGS Brain: Acute intraparenchymal hemorrhage in the left frontal lobe measuring 2.6 cm with overlying extra-axial extension. Small volume of multifocal subarachnoid hemorrhage bilaterally along the right frontal convexity, left frontal convexity and layering within the left sylvian fissure. No midline shift. No hydrocephalus. Remote right occipital infarct. Vascular: Calcific atherosclerosis.  No dense vessel. Skull: No acute fracture. Sinuses/Orbits: Clear sinuses.  No acute  orbital findings. Other: No mastoid effusions. CT CERVICAL SPINE FINDINGS Alignment: No substantial sagittal subluxation. Skull base and vertebrae: No acute fracture. Soft tissues and spinal canal: No prevertebral fluid or swelling. No visible canal hematoma. Disc levels:  Moderate multilevel degenerative change. Upper chest: Visualized lung apices are clear. IMPRESSION: 1. Acute intraparenchymal hemorrhage in left frontal lobe with multifocal bilateral small volume subarachnoid hemorrhage. No midline shift. 2. No  acute fracture or traumatic malalignment in the cervical spine. Findings discussed with Dr. Clayborne Dana via telephone at 2:19 a.m. Electronically Signed   By: Feliberto Harts M.D.   On: 11/30/2023 02:21   DG Chest Portable 1 View Result Date: 11/30/2023 CLINICAL DATA:  Fall EXAM: PORTABLE CHEST 1 VIEW COMPARISON:  03/05/2012 FINDINGS: There is asymmetric airspace infiltrate within the right mid lung zone, possibly infectious or posttraumatic given the history of recent trauma. No pneumothorax or pleural effusion. Cardiac size within normal limits. Pulmonary vascularity is normal. No acute bone abnormality. IMPRESSION: 1. Asymmetric airspace infiltrate within the right mid lung zone, possibly infectious or posttraumatic given the history of recent trauma. Electronically Signed   By: Helyn Numbers M.D.   On: 11/30/2023 01:18   DG Pelvis Portable Result Date: 11/30/2023 CLINICAL DATA:  Fall EXAM: PORTABLE PELVIS 1-2 VIEWS COMPARISON:  None Available. FINDINGS: Left total hip arthroplasty has been performed. No acute fracture or dislocation. Mild right hip degenerative arthritis. Vascular calcifications noted. Soft tissues are otherwise unremarkable. IMPRESSION: 1. Left total hip arthroplasty. No acute fracture or dislocation. Electronically Signed   By: Helyn Numbers M.D.   On: 11/30/2023 01:17   DG HIP UNILAT W OR W/O PELVIS 2-3 VIEWS LEFT Result Date: 11/16/2023 CLINICAL DATA:  [id number] History of left hip hemiarthroplasty [id number] EXAM: DG HIP (WITH OR WITHOUT PELVIS) 2-3V LEFT COMPARISON:  11/15/2023 FINDINGS: Interval left hip arthroplasty, components projecting in expected location. No fracture or dislocation. Surgical clips project over the pubic bones. Patchy iliofemoral arterial calcifications. IMPRESSION: Left hip arthroplasty, without apparent complication. Electronically Signed   By: Corlis Leak M.D.   On: 11/16/2023 16:11   MR BRAIN WO CONTRAST Result Date: 11/15/2023 CLINICAL DATA:  Abnl CT Head,  evaluate for pontine infarct EXAM: MRI HEAD WITHOUT CONTRAST TECHNIQUE: Multiplanar, multiecho pulse sequences of the brain and surrounding structures were obtained without intravenous contrast. COMPARISON:  CT head from today. FINDINGS: Brain: No acute infarction, hemorrhage, hydrocephalus, extra-axial collection or mass lesion. Remote right PCA territory infarct. Moderate T2/FLAIR hyperintensities in the white matter, compatible with chronic microvascular ischemic disease. Vascular: Major arterial flow voids are maintained at the skull base. Skull and upper cervical spine: Normal marrow signal. Sinuses/Orbits: Negative. Other: No sizable mastoid effusions. IMPRESSION: 1. No evidence of acute intracranial abnormality. 2. Remote right PCA territory infarct. Electronically Signed   By: Feliberto Harts M.D.   On: 11/15/2023 22:38   CT Head Wo Contrast Result Date: 11/15/2023 CLINICAL DATA:  Altered mental status, fall EXAM: CT HEAD WITHOUT CONTRAST TECHNIQUE: Contiguous axial images were obtained from the base of the skull through the vertex without intravenous contrast. RADIATION DOSE REDUCTION: This exam was performed according to the departmental dose-optimization program which includes automated exposure control, adjustment of the mA and/or kV according to patient size and/or use of iterative reconstruction technique. COMPARISON:  None Available. FINDINGS: Brain: No acute territorial infarction, hemorrhage or intracranial mass. 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 Atrophy and advanced chronic small vessel ischemic changes of the white matter. Hypodensity at the  pons, series 2, image 11 and sagittal series 6 image 26. Ventricles are nonenlarged Vascular: No hyperdense vessels. Carotid and vertebral calcifications. Skull: Normal. Negative for fracture or focal lesion. Sinuses/Orbits: No acute finding. Other: None IMPRESSION: 1. Negative for acute intracranial hemorrhage  or mass. 2. Hypodensity at the pons, though some of this may be related to artifact, the finding is rounded with some sparing of peripheral tissue and acute or subacute pontine infarct cannot be excluded. MRI is recommended for further evaluation. 3. Atrophy and chronic small vessel ischemic changes of the white matter Electronically Signed   By: Jasmine Pang M.D.   On: 11/15/2023 18:51   DG Ankle Complete Left Result Date: 11/15/2023 CLINICAL DATA:  Fall with ankle pain EXAM: LEFT ANKLE COMPLETE - 3+ VIEW COMPARISON:  01/30/2012 FINDINGS: Lateral plate and multiple fixating screws with additional medial malleolar fixating screws. Chronic postsurgical, posttraumatic and post infectious changes involving the distal tibia, distal fibula and tibiotalar joint. Fused appearance across the tibiotalar joint and probable subtalar fusion as well. No definite acute fracture is seen. IMPRESSION: Chronic postsurgical, posttraumatic and post infectious changes involving the distal tibia, distal fibula and tibiotalar joint. No definite acute osseous abnormality. Electronically Signed   By: Jasmine Pang M.D.   On: 11/15/2023 18:09   DG Foot Complete Left Result Date: 11/15/2023 CLINICAL DATA:  Larey Seat 2 weeks ago with left foot pain. EXAM: LEFT FOOT - COMPLETE 3+ VIEW COMPARISON:  Left ankle 01/30/2012 FINDINGS: Diffuse bone demineralization. Degenerative changes in the interphalangeal joints, metatarsal-phalangeal, tarsometatarsal, and interphalangeal joints. Postoperative fusion of the ankle joint with plate and screw fixation. No evidence of acute fracture or dislocation. Soft tissues are unremarkable. IMPRESSION: 1. No acute bony abnormalities. 2. Degenerative changes in the left foot. 3. Postoperative changes with surgical fusion of the left ankle including plate and screw fixation Electronically Signed   By: Burman Nieves M.D.   On: 11/15/2023 18:03   DG Hip Unilat W or Wo Pelvis 2-3 Views Left Result Date:  11/15/2023 CLINICAL DATA:  Fall 2 weeks ago. Dizziness with fall forward. Left foot pain. EXAM: DG HIP (WITH OR WITHOUT PELVIS) 2-3V LEFT COMPARISON:  None Available. FINDINGS: Transverse subcapital fracture of the left femoral neck with varus angulation of the fracture fragments. No inter trochanteric involvement is identified. No dislocation at the hip joint. Pelvis appears intact. SI joints and symphysis pubis are not displaced. No destructive or expansile bone lesions. Degenerative changes in the lower lumbar spine and both hips. Vascular calcifications. IMPRESSION: Acute transverse fracture of the left femoral neck with varus angulation of the fracture fragments. Degenerative changes. Electronically Signed   By: Burman Nieves M.D.   On: 11/15/2023 18:02     PHYSICAL EXAM  Temp:  [98.8 F (37.1 C)-100.6 F (38.1 C)] 100.2 F (37.9 C) (02/25 0800) Pulse Rate:  [66-95] 88 (02/25 0700) Resp:  [13-29] 16 (02/25 0700) BP: (101-134)/(51-74) 118/54 (02/25 0700) SpO2:  [98 %-100 %] 99 % (02/25 0700) Weight:  [60.8 kg] 60.8 kg (02/25 0400)  General - well nourished, well developed, in no apparent distress.     Cardiovascular - regular rhythm and rate   Neuro -  Awake, calm. Does not follow commands, non verbal. CN: left pupil 3mm right pupil 2mm appears irregular but reactive. No forced gaze deviation.  NG tube in place.  Motor: right arm/leg spontanous mvt. Dense right hemiplegia.  Sensory: withdraws to stimuli b/l.    ASSESSMENT/PLAN William Todd is a 73  y.o. male with PMH of DM, HTN, HLD, prostate cancer, left foot fracture with prosthesis, dementia and recent fall with acute transverse fracture of the left femoral neck s/p hemiarthroplasty on 2/2. Post op delirium/confusion but MRI at that time no acute finding, no stroke or hemorrhage. No sign of CAA. He was put on ASA 81 bid for 4 weeks for DVT prophylaxis. He was discharged to SNF on 11/20/23. Readmitted on 2/15 with fall at  SNF and left eyebrow laceration. CT head showed left frontal small ICH with bilateral small SAH, concerning for traumatic ICH with contusion. However repeat CT 2/16 showed significant enlarged hematoma on the left frontal area with multi-lobe configuration and IVH and SAH but no hydro with 5mm MLS.     ICH:  left frontal large ICH, etiology unclear, traumatic ICH vs. Spontaneous ICH. Less likely for hemorrhagic infarct CT head 2/16 left frontal small ICH with bilateral small SAH, concerning for traumatic ICH with contusion.  Repeat CT 2/16 showed significant enlarged hematoma on the left frontal area with multi-lobe configuration and IVH and SAH but no hydro with 5mm MLS.    Repeat CT 2/17 Similar appearance of intraparenchymal hematoma in the left frontal lobe with associated mass effect and 5 mm rightward midline shift. CTA head and neck - no spot sign. 60% R ICA bulb, b/l distal VA mod to severe stenosis,  mod left VA origin stenosis and mild to mod b/l ICA siphon CT repeat 2/18 large left frontal ICH 108cc vs. 2/17 105cc but with MLS of 7-8 mm.  MRI with and without unchanged size of large left frontal intraparenchymal hematoma with 4 mm of anterior rightward midline shift. Blood layering within both lateral ventricle. Subarachnoid blood over both hemisphere.  CT repeat 2/20 stable hematoma and MLS 2D Echo  EF 60-65% EEG left frontal sharp waves,  moderate diffuse encephalopathy, no seizure LDL 57 HgbA1c 6.1 SCDs for VTE prophylaxis aspirin 81 mg daily bid prior to admission, now on No antithrombotic.  Keppra 500 -> 750 mg bid>off. Ongoing aggressive stroke risk factor management Therapy recommendations:  SNF Disposition:  pending  Cerebral edema Repeat CT 2/17 5 mm rightward midline shift. CT repeat 2/18 MLS of 7-8 mm.  CT repeat 2/20 stable hematoma and midline shift MRI Brain 2/22, MLS of 4 mm Sodium titrating down gradually.  Continue to monitor.   Hypertension Goal is  normotensive   Hyperlipidemia Home meds:  lipitor 40  LDL 57, goal < 70 Consider to resume lipitor on discharge   Dysphagia Did not pass swallow Currently n.p.o. cortrak placed On tube feeding  Other Stroke Risk Factors Advanced age  Other Active Problems Recent fracture - fall with acute transverse fracture of the left femoral neck with varus angulation of the fracture fragments. Underwent hemiarthroplasty on 2/2.   Hospital day # 9  PT OT therapy.  Continue to allow sodium to trend down.  Appreciate primary service assistance.  Neurology will continue to follow   Jw Covin,MD    To contact Stroke Continuity provider, please refer to [email or web address]. After hours, contact General Neurology

## 2023-12-09 NOTE — Progress Notes (Signed)
 Nutrition Follow-up  DOCUMENTATION CODES:   Severe malnutrition in context of chronic illness  INTERVENTION:  Continue tube feeding via Cortrak: Osmolite 1.5 at 50 ml/h (1200 ml per day) Prosource TF20 60 ml daily Free Water Flushes Q4 hours   Provides 1880 kcal, 95 gm protein, free water daily    MVI w/ minerals Continue bowel regimen  Monitor SLP notes for advancement in oral diet  NUTRITION DIAGNOSIS:  Severe Malnutrition related to chronic illness as evidenced by severe fat depletion, severe muscle depletion, percent weight loss. - remains applicable  GOAL:  Patient will meet greater than or equal to 90% of their needs - meeting with tube feeds  MONITOR:  Diet advancement, TF tolerance, Weight trends, Labs, Skin  REASON FOR ASSESSMENT:  Consult Enteral/tube feeding initiation and management, Assessment of nutrition requirement/status  ASSESSMENT:   Pt recently discharged on 2/6 after mechanical fall at home resulting in L femoral neck fracture requiring L hip arthroplasty. Discharged to [facility] where he fell again resulting in ICH. PMH: CAD, HLD, T2DM, HTN, anxiety, and prostate cancer (radiation 09/29/13-11/25/13). No formal dx of dementia, however with poor short term memory.  2/6 discharged from [facility] to [facility] 2/16 admitted to [facility] w/ ICH with bilateral [facility] 2/17 Cortrak placed/TF initiated/ pt pulled out Cortrak 2/19 Cortrak replaced/TF re-initiated/EEG 2/20 hypertonic saline stopped  2/25 transferred out of ICU  Patient continues on tube feeds. Tolerating. Status progressing very slowly. Medically stable and being transferred out of ICU today. Off hypertonic and receiving free water. Consult has been placed to PMT as patient may end of requiring PEG tube and will require SNF placement, per CCM. No N/V noted.   Admit Weight: 60kg Current Weight: 60.8kg  Wt stable this admission. Continues with slow GI transit. Tap water enema ordered 2/24.  Continues on Senna, Miralax, Reglan, and free water flushes for hydration and hypernatremia.    Intake/Output Summary (Last 24 hours) at 12/09/2023 1502 Last data filed at 12/09/2023 1400 Gross per 24 hour  Intake 1950 ml  Output 1300 ml  Net 650 ml     Meds: novolog, reglan, MVI, Miralax, senna   Labs: Na+ 155>153>149 (H) CBGs 174-202 x48 hours A1c 6.1 (11/2023)   Diet Order:   Diet Order             Diet NPO time specified  Diet effective now             EDUCATION NEEDS:  Not appropriate for education at this time  Skin:  Skin Assessment: Skin Integrity Issues: Skin Integrity Issues:: Stage III, Stage II Stage II: L hip arthroplasty site Stage III: L posterior knee  Last BM:  2/24 - type 1  Height:  Ht Readings from Last 1 Encounters:  12/07/23 6' (1.829 m)   Weight:  Wt Readings from Last 1 Encounters:  12/09/23 60.8 kg   Ideal Body Weight:  83.6 kg  BMI:  Body mass index is 18.18 kg/m.  Estimated Nutritional Needs:   Kcal:  1800-2000kcal  Protein:  90-100g  Fluid:  >1.8L/day  Myrtie Cruise MS, RD, LDN Registered Dietitian Clinical Nutrition RD Inpatient Contact Info in Amion

## 2023-12-09 NOTE — Progress Notes (Signed)
 Patient ID: William Todd, male   DOB: 05/17/1951, 73 y.o.   MRN: [id number] Vital signs are stable Patient opens eyes tracks and follows some commands but remains largely nonverbal At this time his bleed is been stable for over a week I will sign off at this time If neurosurgery intervention is contemplated please reconsult [place]

## 2023-12-09 NOTE — Progress Notes (Addendum)
 PROGRESS NOTE    William Todd  ZOX:[id number] DOB: [birth year] DOA: 11/29/2023 PCP: Knox Royalty, MD   Brief Narrative: 73 year old with past medical history significant for hypertension, recurrents mechanical fall, protein caloric malnutrition, diabetes type 2, chronic kidney disease, prostate cancer 2014 who presents via EMS from [facility] after suffering ground-level fall at [facility].  Workup on admission revealed acute intraparenchymal hemorrhage of the left frontal lobe with multiple bilateral small volume SAH.  Neurosurgery was consulted with no surgical intervention recommended.  While boarding in the ED patient had repeated CT which showed ICH revealed progressive left frontal hematoma with intraventricular extension into the fourth ventricle resulting in anterior midline shift of 5 mm, no hydrocephalus.  Neurosurgery reviewed imaging and recommended admission to the ICU under CCM for close monitoring.  2/15 presented after [facility] fall from [facility] workup revealed ICH with bilateral [facility] 2/16 repeat CT while boarding in ED revealed progressive left frontal hematoma with intraventricular extension into the fourth ventricle resulting in anterior midline shift of 5 mm, no hydrocephalus.  Admission changed to ICU 2/18-CT angio, CT head with enlarging clot 2/20 - Stable left frontal hematoma with mild mass effect.   Assessment & Plan:   Principal Problem:   Subarachnoid hemorrhage (HCC) Active Problems:   Intraparenchymal hemorrhage of brain (HCC)   Acute metabolic encephalopathy   History of closed left femoral fracture s/p arthroplasty (HCC)   Long-term memory impairment   Acute kidney injury superimposed on chronic kidney disease (HCC)   Essential hypertension   History of prostate cancer   Non-insulin dependent type 2 diabetes mellitus (HCC)   Normocytic anemia   History of mood disorder   HLD (hyperlipidemia)   ICH (intracerebral hemorrhage) (HCC)   Protein-calorie  malnutrition, severe   1-Left frontal intracerebral traumatic hematoma; Bilateral subarachnoid hemorrhage secondary to trauma - Status post 7 days prophylactic Keppra -CT head 12/16 left frontal small ICH with bilateral small SAH.  Traumatic contusion -Repeated CT 2/16 significant and large hematoma of the left frontal area with multilobe configuration and IVH and SAH, 5 mm midline shift. -CTA head and neck: 60% right ICA involved bilateral VA moderate to severe stenosis, moderate left VA origin stenosis and mild to moderate bilateral ICA; -MRI with and without contrast unchanged size of large left frontal intraparenchymal hematoma with 4 mm anterior rightward midline shift.  Subarachnoid blood over both hemisphere. -EEG left frontal sharp waves, moderate diffuse encephalopathy, no seizure. -Was on aspirin prior to admission now off, no antithrombotic due to ICH.   Cerebral edema: -Repeat CT 2/17, 5 mm rightward  midline shift -CT 2/18: MLS  7/63mm -MRI: 2/22:MLS 4mm -Sodium trending down, continue to follow  AKI CKD stage IIIb: -Patient creatinine peaked to 1.7 -Trending down.  Continue to monitor  Hypernatremia: He received hypertonic.  Currently hypertonic off. Monitor On free water Trending down.  Fever: Will check chest x-ray and UA   Hypertension: Goal is normotensive. Will reduce Norvasc to 5 mg starting tomorrow blood pressure soft in the 100 range Will add hold parameters for metoprolol  Diabetes type 2: Sliding scale insulin, 5 units tube feeding coverage every 4 hours  History of frailty with recurrent mechanical falls.   Constipation: -On MiraLAX and Senokot  Nutrition:, Severe protein caloric malnutrition On tube feedings    See wound care documentation below Pressure Injury 11/30/23 Hip Left Stage 2 -  Partial thickness loss of dermis presenting as a shallow open injury with a red, pink wound bed without slough. (  Active)  11/30/23 2000  Location: Hip   Location Orientation: Left  Staging: Stage 2 -  Partial thickness loss of dermis presenting as a shallow open injury with a red, pink wound bed without slough.  Wound Description (Comments):   Present on Admission:   Dressing Type Foam - Lift dressing to assess site every shift 12/08/23 2000     Pressure Injury 12/01/23 Knee Left;Posterior Stage 3 -  Full thickness tissue loss. Subcutaneous fat may be visible but bone, tendon or muscle are NOT exposed. (Active)  12/01/23 2000  Location: Knee  Location Orientation: Left;Posterior  Staging: Stage 3 -  Full thickness tissue loss. Subcutaneous fat may be visible but bone, tendon or muscle are NOT exposed.  Wound Description (Comments):   Present on Admission:   Dressing Type Foam - Lift dressing to assess site every shift 12/08/23 2000     Nutrition Problem: Severe Malnutrition Etiology: chronic illness    Signs/Symptoms: severe fat depletion, severe muscle depletion, percent weight loss    Interventions: Tube feeding, MVI, Refer to RD note for recommendations  Estimated body mass index is 18.18 kg/m as calculated from the following:   Height as of this encounter: 6' (1.829 m).   Weight as of this encounter: 60.8 kg.   DVT prophylaxis: Heparin Code Status: Full code Family Communication: Wife who was at bedside Disposition Plan:  Status is: Inpatient Remains inpatient appropriate because: Management of intracerebral traumatic hematoma    Consultants:  Neurology CCM  Procedures:   Antimicrobials:    Subjective: She is alert, aphasic, not following commands  Objective: Vitals:   12/09/23 0300 12/09/23 0400 12/09/23 0500 12/09/23 0600  BP: 109/62 125/65 (!) 115/57 119/61  Pulse: 81 80 83 87  Resp: 16 15 14 14   Temp:  99.1 F (37.3 C)    TempSrc:  Axillary    SpO2: 100% 100% 98% 99%  Weight:  60.8 kg    Height:        Intake/Output Summary (Last 24 hours) at 12/09/2023 0732 Last data filed at 12/09/2023  0700 Gross per 24 hour  Intake 2200 ml  Output 1500 ml  Net 700 ml   Filed Weights   12/07/23 1700 12/08/23 0408 12/09/23 0400  Weight: 58.9 kg 60 kg 60.8 kg    Examination:  General exam: Appears calm and comfortable  Respiratory system: Clear to auscultation. Respiratory effort normal. Cardiovascular system: S1 & S2 heard, RRR.  Gastrointestinal system: Abdomen is nondistended, soft and nontender.  Central nervous system: Alert, not answering questions, nonverbal, not following commands Extremities: no edema    Data Reviewed: I have personally reviewed following labs and imaging studies  CBC: Recent Labs  Lab 12/03/23 0456 12/04/23 0431 12/05/23 0822 12/06/23 0415 12/09/23 0451  WBC 7.7 6.8 9.1 9.4 8.0  HGB 8.6* 7.8* 8.2* 8.4* 8.1*  HCT 26.1* 24.2* 25.9* 27.0* 25.5*  MCV 92.2 93.4 95.9 95.1 95.1  PLT 156 145* 137* 137* 131*   Basic Metabolic Panel: Recent Labs  Lab 12/03/23 0456 12/03/23 0929 12/05/23 0822 12/05/23 1555 12/06/23 0415 12/06/23 0748 12/07/23 0152 12/07/23 0848 12/07/23 1308 12/08/23 0525 12/09/23 0451  NA 147*   < > 154*   < > 158*   < > 154* 155* 155* 153* 149*  K 3.7   < > 4.2  --  3.8  --   --  3.6  --  4.1 4.0  CL 112*   < > 121*  --  118*  --   --  118*  --  118* 117*  CO2 22   < > 27  --  27  --   --  27  --  27 28  GLUCOSE 173*   < > 284*  --  142*  --   --  163*  --  202* 174*  BUN 16   < > 25*  --  29*  --   --  34*  --  41* 34*  CREATININE 1.49*   < > 1.37*  --  1.47*  --   --  1.40*  --  1.79* 1.45*  CALCIUM 9.2   < > 8.7*  --  9.5  --   --  9.0  --  8.6* 8.3*  MG 1.8  --  2.1  --   --   --   --   --   --   --  2.4  PHOS 3.0  --   --   --   --   --   --   --   --   --  3.7   < > = values in this interval not displayed.   GFR: Estimated Creatinine Clearance: 39 mL/min (A) (by C-G formula based on SCr of 1.45 mg/dL (H)). Liver Function Tests: No results for input(s): "AST", "ALT", "ALKPHOS", "BILITOT", "PROT", "ALBUMIN" in  the last 168 hours. No results for input(s): "LIPASE", "AMYLASE" in the last 168 hours. No results for input(s): "AMMONIA" in the last 168 hours. Coagulation Profile: No results for input(s): "INR", "PROTIME" in the last 168 hours. Cardiac Enzymes: No results for input(s): "CKTOTAL", "CKMB", "CKMBINDEX", "TROPONINI" in the last 168 hours. BNP (last 3 results) No results for input(s): "PROBNP" in the last 8760 hours. HbA1C: No results for input(s): "HGBA1C" in the last 72 hours. CBG: Recent Labs  Lab 12/08/23 1139 12/08/23 1542 12/08/23 1954 12/08/23 2345 12/09/23 0401  GLUCAP 157* 157* 229* 164* 141*   Lipid Profile: No results for input(s): "CHOL", "HDL", "LDLCALC", "TRIG", "CHOLHDL", "LDLDIRECT" in the last 72 hours. Thyroid Function Tests: No results for input(s): "TSH", "T4TOTAL", "FREET4", "T3FREE", "THYROIDAB" in the last 72 hours. Anemia Panel: No results for input(s): "VITAMINB12", "FOLATE", "FERRITIN", "TIBC", "IRON", "RETICCTPCT" in the last 72 hours. Sepsis Labs: Recent Labs  Lab 12/08/23 0959  PROCALCITON 0.15    Recent Results (from the past 240 hours)  MRSA Next Gen by PCR, Nasal     Status: None   Collection Time: 11/30/23 11:19 AM   Specimen: Nasal Mucosa; Nasal Swab  Result Value Ref Range Status   MRSA by PCR Next Gen NOT DETECTED NOT DETECTED Final    Comment: (NOTE) The GeneXpert MRSA Assay (FDA approved for NASAL specimens only), is one component of a comprehensive MRSA colonization surveillance program. It is not intended to diagnose MRSA infection nor to guide or monitor treatment for MRSA infections. Test performance is not FDA approved in patients less than 72 years old. Performed at [facility], [address], [place], [place] [address]          Radiology Studies: No results found.      Scheduled Meds:  amantadine  100 mg Per Tube BID   amLODipine  10 mg Per Tube Daily   Chlorhexidine Gluconate Cloth  6 each Topical  Daily   feeding supplement (PROSource TF20)  60 mL Per Tube Daily   free water  200 mL Per Tube Q4H   heparin injection (subcutaneous)  5,000 Units Subcutaneous Q12H  insulin aspart  0-15 Units Subcutaneous Q4H   insulin aspart  5 Units Subcutaneous Q4H   metoCLOPramide  10 mg Per Tube TID AC & HS   metoprolol tartrate  50 mg Per Tube BID   multivitamin with minerals  1 tablet Per Tube Daily   mouth rinse  15 mL Mouth Rinse 4 times per day   polyethylene glycol  17 g Per Tube Daily   senna  1 tablet Per Tube BID   sodium chloride flush  3 mL Intravenous Q12H   Continuous Infusions:  feeding supplement (OSMOLITE 1.5 CAL) 50 mL/hr at 12/09/23 0700     LOS: 9 days    Time spent: 35 minutes    Jennise Both A Johnnie Moten, MD [facility]   If 7PM-7AM, please contact night-coverage [email or web address]  12/09/2023, 7:32 AM

## 2023-12-10 DIAGNOSIS — I609 Nontraumatic subarachnoid hemorrhage, unspecified: Secondary | ICD-10-CM | POA: Diagnosis not present

## 2023-12-10 LAB — GLUCOSE, CAPILLARY
Glucose-Capillary: 139 mg/dL — ABNORMAL HIGH (ref 70–99)
Glucose-Capillary: 151 mg/dL — ABNORMAL HIGH (ref 70–99)
Glucose-Capillary: 157 mg/dL — ABNORMAL HIGH (ref 70–99)
Glucose-Capillary: 173 mg/dL — ABNORMAL HIGH (ref 70–99)
Glucose-Capillary: 178 mg/dL — ABNORMAL HIGH (ref 70–99)
Glucose-Capillary: 189 mg/dL — ABNORMAL HIGH (ref 70–99)
Glucose-Capillary: 197 mg/dL — ABNORMAL HIGH (ref 70–99)

## 2023-12-10 LAB — BASIC METABOLIC PANEL
Anion gap: 7 (ref 5–15)
BUN: 34 mg/dL — ABNORMAL HIGH (ref 8–23)
CO2: 28 mmol/L (ref 22–32)
Calcium: 8.7 mg/dL — ABNORMAL LOW (ref 8.9–10.3)
Chloride: 113 mmol/L — ABNORMAL HIGH (ref 98–111)
Creatinine, Ser: 1.46 mg/dL — ABNORMAL HIGH (ref 0.61–1.24)
GFR, Estimated: 50 mL/min — ABNORMAL LOW (ref >60)
Glucose, Bld: 171 mg/dL — ABNORMAL HIGH (ref 70–99)
Potassium: 4.2 mmol/L (ref 3.5–5.1)
Sodium: 148 mmol/L — ABNORMAL HIGH (ref 135–145)

## 2023-12-10 LAB — CBC
HCT: 25 % — ABNORMAL LOW (ref 39.0–52.0)
Hemoglobin: 7.9 g/dL — ABNORMAL LOW (ref 13.0–17.0)
MCH: 30.2 pg (ref 26.0–34.0)
MCHC: 31.6 g/dL (ref 30.0–36.0)
MCV: 95.4 fL (ref 80.0–100.0)
Platelets: 131 10*3/uL — ABNORMAL LOW (ref 150–400)
RBC: 2.62 MIL/uL — ABNORMAL LOW (ref 4.22–5.81)
RDW: 15.1 % (ref 11.5–15.5)
WBC: 8.8 10*3/uL (ref 4.0–10.5)
nRBC: 0 % (ref 0.0–0.2)

## 2023-12-10 LAB — MAGNESIUM: Magnesium: 2.3 mg/dL (ref 1.7–2.4)

## 2023-12-10 LAB — PHOSPHORUS: Phosphorus: 2.9 mg/dL (ref 2.5–4.6)

## 2023-12-10 NOTE — Progress Notes (Signed)
 PROGRESS NOTE    William Todd  JYN:[id number] DOB: 05/25/1951 DOA: 11/29/2023 PCP: Knox Royalty, MD   Brief Narrative:  This 73 year old Male with past medical history significant for hypertension, recurrents mechanical fall, protein caloric malnutrition, diabetes type 2, chronic kidney disease, prostate cancer 2014 who presents via EMS from [facility] after suffering ground-level fall at [facility].  Workup on admission revealed acute intraparenchymal hemorrhage of the left frontal lobe with multiple bilateral small volume SAH.  Neurosurgery was consulted with no surgical intervention recommended.  While boarding in the ED patient had repeat CT which showed ICH revealed progressive left frontal hematoma with intraventricular extension into the fourth ventricle resulting in anterior midline shift of 5 mm, no hydrocephalus.  Neurosurgery reviewed imaging and recommended admission to the ICU under CCM for close monitoring.   2/15 presented after ground-level fall from SNF workup revealed ICH with bilateral [facility] 2/16 repeat CT while boarding in ED revealed progressive left frontal hematoma with intraventricular extension into the fourth ventricle resulting in anterior midline shift of 5 mm, no hydrocephalus.  Admission changed to ICU 2/18-CT angio, CT head with enlarging clot 2/20 - Stable left frontal hematoma with mild mass effect. 2/26: At this time bleeding has been stable for over a week.  Neurosurgery signed off.    Assessment & Plan:   Principal Problem:   Subarachnoid hemorrhage (HCC) Active Problems:   Intraparenchymal hemorrhage of brain (HCC)   Acute metabolic encephalopathy   History of closed left femoral fracture s/p arthroplasty (HCC)   Long-term memory impairment   Acute kidney injury superimposed on chronic kidney disease (HCC)   Essential hypertension   History of prostate cancer   Non-insulin dependent type 2 diabetes mellitus (HCC)   Normocytic anemia   History of mood  disorder   HLD (hyperlipidemia)   ICH (intracerebral hemorrhage) (HCC)   Protein-calorie malnutrition, severe  Left frontal intracerebral traumatic hematoma: Patient presented with B/L SAH sec.to trauma. Status post 7 days of prophylactic Keppra. CT head 12/16 left frontal small ICH with bilateral small SAH.  Traumatic contusion Repeat CT 2/16 showed significant and large hematoma of the left frontal area with multilobe configuration and IVH and SAH, 5 mm midline shift. CTA head and neck: 60% right ICA involved bilateral VA moderate to severe stenosis, moderate left VA origin stenosis and mild to moderate bilateral ICA; MRI with and without contrast unchanged size of large left frontal intraparenchymal hematoma with 4 mm anterior rightward midline shift.  Subarachnoid blood over both hemisphere. EEG showed left frontal sharp waves, moderate diffuse encephalopathy, no seizure. Patient was on aspirin prior to admission,  now off, No antithrombotic due to ICH. At this time bleeding has been stable for over a week.  Neurosurgery signed off.   Cerebral edema: Repeat CT 2/17, 5 mm rightward  midline shift. CT 2/18: MLS  7/29mm MRI: 2/22:MLS 4mm Sodium trending down, continue to follow.   AKI CKD stage IIIb: Patient creatinine peaked to 1.7 Trending down.  Continue to monitor   Hypernatremia: He received hypertonic Saline.  Currently hypertonic off. Continue to monitor sodium  On free water  Fever: UA and chest x-ray unremarkable.   Hypertension: Goal is normotensive. Continue Norvasc and metoprolol.   Diabetes type 2: Sliding scale insulin, 5 units tube feeding coverage every 4 hours   History of frailty with recurrent mechanical falls.    Constipation: Continue MiraLAX and Senokot   Nutrition:, Severe protein caloric malnutrition On tube feedings.     See  wound care documentation below     Pressure Injury 11/30/23 Hip Left Stage 2 -  Partial thickness loss of dermis  presenting as a shallow open injury with a red, pink wound bed without slough. (Active)  11/30/23 2000  Location: Hip  Location Orientation: Left  Staging: Stage 2 -  Partial thickness loss of dermis presenting as a shallow open injury with a red, pink wound bed without slough.  Wound Description (Comments):   Present on Admission:   Dressing Type Foam - Lift dressing to assess site every shift 12/08/23 2000     Pressure Injury 12/01/23 Knee Left;Posterior Stage 3 -  Full thickness tissue loss. Subcutaneous fat may be visible but bone, tendon or muscle are NOT exposed. (Active)  12/01/23 2000  Location: Knee  Location Orientation: Left;Posterior  Staging: Stage 3 -  Full thickness tissue loss. Subcutaneous fat may be visible but bone, tendon or muscle are NOT exposed.  Wound Description (Comments):   Present on Admission:   Dressing Type Foam - Lift dressing to assess site every shift 12/08/23 2000        Nutrition Problem: Severe Malnutrition Etiology: chronic illness       Signs/Symptoms: severe fat depletion, severe muscle depletion, percent weight loss       Interventions: Tube feeding, MVI, Refer to RD note for recommendations   Estimated body mass index is 18.18 kg/m as calculated from the following:   Height as of this encounter: 6' (1.829 m).   Weight as of this encounter: 60.8 kg.   DVT prophylaxis: Heparin Code Status:Full code Family Communication: No family at bed side. Disposition Plan:    Status is: Inpatient Remains inpatient appropriate because: Severity of illness    Consultants:  Neurology Neurosurgery  Procedures:  MRI , EEG Antimicrobials:  Anti-infectives (From admission, onward)    None      Subjective: Patient was seen and examined at bedside. Overnight events noted. Patient has cortrek tube for feeding. Patient is following partial commands. He was agitated and has partial mittens.  Objective: Vitals:   12/10/23 0007 12/10/23  0404 12/10/23 0803 12/10/23 1051  BP: 119/63 111/64 (!) 129/59 132/72  Pulse: 84 80 88 85  Resp: 16 17 19 17   Temp: 97.9 F (36.6 C) (!) 97.5 F (36.4 C) 99.7 F (37.6 C) 98.6 F (37 C)  TempSrc: Oral Axillary Oral Oral  SpO2: 98% 98% 98% 99%  Weight:      Height:        Intake/Output Summary (Last 24 hours) at 12/10/2023 1416 Last data filed at 12/10/2023 0740 Gross per 24 hour  Intake --  Output 1150 ml  Net -1150 ml   Filed Weights   12/07/23 1700 12/08/23 0408 12/09/23 0400  Weight: 58.9 kg 60 kg 60.8 kg    Examination:  General exam: Appears calm and comfortable, deconditioned, not in any acute distress. Respiratory system: Clear to auscultation. Respiratory effort normal.  RR 15 Cardiovascular system: S1 & S2 heard, RRR. No JVD, murmurs, rubs, gallops or clicks.  Gastrointestinal system: Abdomen is nondistended, soft and nontender.  Normal bowel sounds heard. Central nervous system: Alert and oriented X 1. No focal neurological deficits. Extremities: No edema, no cyanosis, no clubbing Skin: No rashes, lesions or ulcers Psychiatry: Mood & affect appropriate.     Data Reviewed: I have personally reviewed following labs and imaging studies  CBC: Recent Labs  Lab 12/04/23 0431 12/05/23 0822 12/06/23 0415 12/09/23 0451 12/10/23 0627  WBC 6.8 9.1  9.4 8.0 8.8  HGB 7.8* 8.2* 8.4* 8.1* 7.9*  HCT 24.2* 25.9* 27.0* 25.5* 25.0*  MCV 93.4 95.9 95.1 95.1 95.4  PLT 145* 137* 137* 131* 131*   Basic Metabolic Panel: Recent Labs  Lab 12/05/23 0822 12/05/23 1555 12/06/23 0415 12/06/23 0748 12/07/23 0848 12/07/23 1308 12/08/23 0525 12/09/23 0451 12/10/23 0627  NA 154*   < > 158*   < > 155* 155* 153* 149* 148*  K 4.2  --  3.8  --  3.6  --  4.1 4.0 4.2  CL 121*  --  118*  --  118*  --  118* 117* 113*  CO2 27  --  27  --  27  --  27 28 28   GLUCOSE 284*  --  142*  --  163*  --  202* 174* 171*  BUN 25*  --  29*  --  34*  --  41* 34* 34*  CREATININE 1.37*  --   1.47*  --  1.40*  --  1.79* 1.45* 1.46*  CALCIUM 8.7*  --  9.5  --  9.0  --  8.6* 8.3* 8.7*  MG 2.1  --   --   --   --   --   --  2.4 2.3  PHOS  --   --   --   --   --   --   --  3.7 2.9   < > = values in this interval not displayed.   GFR: Estimated Creatinine Clearance: 38.8 mL/min (A) (by C-G formula based on SCr of 1.46 mg/dL (H)). Liver Function Tests: No results for input(s): "AST", "ALT", "ALKPHOS", "BILITOT", "PROT", "ALBUMIN" in the last 168 hours. No results for input(s): "LIPASE", "AMYLASE" in the last 168 hours. No results for input(s): "AMMONIA" in the last 168 hours. Coagulation Profile: No results for input(s): "INR", "PROTIME" in the last 168 hours. Cardiac Enzymes: No results for input(s): "CKTOTAL", "CKMB", "CKMBINDEX", "TROPONINI" in the last 168 hours. BNP (last 3 results) No results for input(s): "PROBNP" in the last 8760 hours. HbA1C: No results for input(s): "HGBA1C" in the last 72 hours. CBG: Recent Labs  Lab 12/09/23 1944 12/10/23 0011 12/10/23 0402 12/10/23 0804 12/10/23 1100  GLUCAP 189* 178* 151* 139* 173*   Lipid Profile: No results for input(s): "CHOL", "HDL", "LDLCALC", "TRIG", "CHOLHDL", "LDLDIRECT" in the last 72 hours. Thyroid Function Tests: No results for input(s): "TSH", "T4TOTAL", "FREET4", "T3FREE", "THYROIDAB" in the last 72 hours. Anemia Panel: No results for input(s): "VITAMINB12", "FOLATE", "FERRITIN", "TIBC", "IRON", "RETICCTPCT" in the last 72 hours. Sepsis Labs: Recent Labs  Lab 12/08/23 0959  PROCALCITON 0.15    No results found for this or any previous visit (from the past 240 hours).   Radiology Studies: DG CHEST PORT 1 VIEW Result Date: 12/09/2023 CLINICAL DATA:  Fever EXAM: PORTABLE CHEST 1 VIEW COMPARISON:  12/05/2023 FINDINGS: Two frontal views of the chest and upper abdomen are obtained. Enteric catheter passes below diaphragm tip projecting over the gastric fundus. Cardiac silhouette is unremarkable. No airspace  disease, effusion, or pneumothorax. No acute bony abnormalities. IMPRESSION: 1. Enteric catheter tip projecting over gastric fundus. 2. No acute intrathoracic process. Electronically Signed   By: Sharlet Salina M.D.   On: 12/09/2023 15:49   Scheduled Meds:  amantadine  100 mg Per Tube BID   amLODipine  5 mg Per Tube Daily   feeding supplement (PROSource TF20)  60 mL Per Tube Daily   free water  200 mL Per Tube  Q4H   heparin injection (subcutaneous)  5,000 Units Subcutaneous Q12H   insulin aspart  0-15 Units Subcutaneous Q4H   insulin aspart  5 Units Subcutaneous Q4H   metoCLOPramide  10 mg Per Tube TID AC & HS   metoprolol tartrate  50 mg Per Tube BID   multivitamin with minerals  1 tablet Per Tube Daily   mouth rinse  15 mL Mouth Rinse 4 times per day   polyethylene glycol  17 g Per Tube Daily   senna  1 tablet Per Tube BID   sodium chloride flush  3 mL Intravenous Q12H   Continuous Infusions:  feeding supplement (OSMOLITE 1.5 CAL) 1,000 mL (12/10/23 0014)     LOS: 10 days    Time spent: 50 mins    Willeen Niece, MD [facility]   If 7PM-7AM, please contact night-coverage

## 2023-12-10 NOTE — Progress Notes (Addendum)
 STROKE TEAM PROGRESS NOTE   SUBJECTIVE (INTERVAL HISTORY)  Wife is at the bedside No new neurological events overnight Neurological exam is unchanged  Still not swallowing.  OBJECTIVE Temp:  [97.5 F (36.4 C)-99.7 F (37.6 C)] 98.6 F (37 C) (02/26 1051) Pulse Rate:  [72-92] 85 (02/26 1051) Cardiac Rhythm: Normal sinus rhythm (02/26 0700) Resp:  [14-19] 17 (02/26 1051) BP: (89-132)/(54-72) 132/72 (02/26 1051) SpO2:  [98 %-100 %] 99 % (02/26 1051)  Recent Labs  Lab 12/09/23 1944 12/10/23 0011 12/10/23 0402 12/10/23 0804 12/10/23 1100  GLUCAP 189* 178* 151* 139* 173*   Recent Labs  Lab 12/05/23 7253 12/05/23 1555 12/06/23 0415 12/06/23 0748 12/07/23 0848 12/07/23 1308 12/08/23 0525 12/09/23 0451 12/10/23 0627  NA 154*   < > 158*   < > 155* 155* 153* 149* 148*  K 4.2  --  3.8  --  3.6  --  4.1 4.0 4.2  CL 121*  --  118*  --  118*  --  118* 117* 113*  CO2 27  --  27  --  27  --  27 28 28   GLUCOSE 284*  --  142*  --  163*  --  202* 174* 171*  BUN 25*  --  29*  --  34*  --  41* 34* 34*  CREATININE 1.37*  --  1.47*  --  1.40*  --  1.79* 1.45* 1.46*  CALCIUM 8.7*  --  9.5  --  9.0  --  8.6* 8.3* 8.7*  MG 2.1  --   --   --   --   --   --  2.4 2.3  PHOS  --   --   --   --   --   --   --  3.7 2.9   < > = values in this interval not displayed.   No results for input(s): "AST", "ALT", "ALKPHOS", "BILITOT", "PROT", "ALBUMIN" in the last 168 hours.  Recent Labs  Lab 12/04/23 0431 12/05/23 0822 12/06/23 0415 12/09/23 0451 12/10/23 0627  WBC 6.8 9.1 9.4 8.0 8.8  HGB 7.8* 8.2* 8.4* 8.1* 7.9*  HCT 24.2* 25.9* 27.0* 25.5* 25.0*  MCV 93.4 95.9 95.1 95.1 95.4  PLT 145* 137* 137* 131* 131*   No results for input(s): "CKTOTAL", "CKMB", "CKMBINDEX", "TROPONINI" in the last 168 hours. No results for input(s): "LABPROT", "INR" in the last 72 hours.       Component Value Date/Time   CHOL 111 12/02/2023 0624   TRIG 63 12/02/2023 0624   HDL 41 12/02/2023 0624   CHOLHDL  2.7 12/02/2023 0624   VLDL 13 12/02/2023 0624   LDLCALC 57 12/02/2023 0624   Lab Results  Component Value Date   HGBA1C 6.1 (H) 12/02/2023    DG CHEST PORT 1 VIEW Result Date: 12/09/2023 CLINICAL DATA:  Fever EXAM: PORTABLE CHEST 1 VIEW COMPARISON:  12/05/2023 FINDINGS: Two frontal views of the chest and upper abdomen are obtained. Enteric catheter passes below diaphragm tip projecting over the gastric fundus. Cardiac silhouette is unremarkable. No airspace disease, effusion, or pneumothorax. No acute bony abnormalities. IMPRESSION: 1. Enteric catheter tip projecting over gastric fundus. 2. No acute intrathoracic process. Electronically Signed   By: Sharlet Salina M.D.   On: 12/09/2023 15:49   DG Abd Portable 1V Result Date: 12/06/2023 CLINICAL DATA:  Nasogastric tube placement. EXAM: PORTABLE ABDOMEN - 1 VIEW COMPARISON:  December 03, 2023. FINDINGS: Distal tip of feeding tube is seen in expected position of proximal  stomach. IMPRESSION: Distal tip of feeding tube is seen in expected position of proximal stomach. Electronically Signed   By: Lupita Raider M.D.   On: 12/06/2023 09:19   DG CHEST PORT 1 VIEW Result Date: 12/05/2023 CLINICAL DATA:  Fever EXAM: PORTABLE CHEST 1 VIEW COMPARISON:  11/29/2023 FINDINGS: Enteric tube tip at the distal esophagus. Lung fields are clear. Cardiac size is normal. Aortic atherosclerosis. IMPRESSION: No active disease. Enteric tube tip at the distal esophagus, recommend advancement for more optimal positioning. Electronically Signed   By: Jasmine Pang M.D.   On: 12/05/2023 21:13   MR BRAIN W WO CONTRAST Result Date: 12/05/2023 CLINICAL DATA:  Hemorrhagic stroke EXAM: MRI HEAD WITHOUT AND WITH CONTRAST TECHNIQUE: Multiplanar, multiecho pulse sequences of the brain and surrounding structures were obtained without and with intravenous contrast. CONTRAST:  6mL GADAVIST GADOBUTROL 1 MMOL/ML IV SOLN COMPARISON:  Head CT 12/04/2023 FINDINGS: Brain: Unchanged size of  large left frontal intraparenchymal hematoma. Blood layering within both lateral ventricles. Subarachnoid blood over both hemispheres. There is multifocal hyperintense T2-weighted signal within the white matter. Generalized volume loss. There is 4 mm of anterior rightward midline shift. The midline structures are normal. There is no abnormal contrast enhancement. Vascular: Normal flow voids. Skull and upper cervical spine: Normal calvarium and skull base. Visualized upper cervical spine and soft tissues are normal. Sinuses/Orbits:No paranasal sinus fluid levels or advanced mucosal thickening. No mastoid or middle ear effusion. Normal orbits. IMPRESSION: 1. Unchanged size of large left frontal intraparenchymal hematoma with 4 mm of anterior rightward midline shift. 2. Blood layering within both lateral ventricles. 3. Subarachnoid blood over both hemispheres. Electronically Signed   By: Deatra Robinson M.D.   On: 12/05/2023 19:24   CT HEAD WO CONTRAST ( ) Result Date: 12/04/2023 CLINICAL DATA:  73 year old male with multifocal intracranial hemorrhage after a fall. Subsequent encounter. EXAM: CT HEAD WITHOUT CONTRAST TECHNIQUE: Contiguous axial images were obtained from the base of the skull through the vertex without intravenous contrast. RADIATION DOSE REDUCTION: This exam was performed according to the departmental dose-optimization program which includes automated exposure control, adjustment of the mA and/or kV according to patient size and/or use of iterative reconstruction technique. COMPARISON:  12/02/2023 and earlier. FINDINGS: Brain: Left anterior frontal lobe intra-axial hemorrhage remains hyperdense and amorphous. Due to scan angulation the most direct comparison now is on sagittal images (series 6, image 39 today versus the same series and image 2 days ago), where hemorrhage size and configuration appear unchanged. Regional edema and mass effect also unchanged. Scattered bilateral subarachnoid blood has  not significantly changed. Small volume layering intraventricular hemorrhage has not significantly changed. Stable ventricle size and configuration, no ventriculomegaly. Stable mild rightward midline shift limited to the anterior septum pellucidum. Basilar cisterns are stable. Stable gray-white matter differentiation throughout the brain. Left frontal lobe edema surrounding the hematoma and chronic right PCA territory infarct with encephalomalacia. Vascular: No suspicious intracranial vascular hyperdensity. Skull: Intact, stable. Incidental large maxillary torus exostoses incidentally noted. Sinuses/Orbits: Left nasoenteric tube now in place. Visualized paranasal sinuses and mastoids are stable and well aerated. Other: Stable orbit and scalp soft tissues. IMPRESSION: 1. Continued stability of large anterior left frontal lobar hemorrhage, associated edema. Relatively mild intracranial mass effect, scattered small volume bilateral SAH, and IVH remain stable. 2. No new intracranial abnormality. Chronic right PCA territory infarct. Electronically Signed   By: Odessa Fleming M.D.   On: 12/04/2023 06:37   EEG adult Result Date: 12/03/2023 Charlsie Quest, MD  12/03/2023  4:09 PM Patient Name: CHANDAN FLY MRN: [id number] Epilepsy Attending: Charlsie Quest Referring Physician/Provider: Elmer Picker, NP Date: 12/03/2023 Duration: 23.45 mins Patient history: 73yo M with left frontal ICH. EEG to evaluate for seizure Level of alertness: Awake, asleep AEDs during EEG study: LEV Technical aspects: This EEG study was done with scalp electrodes positioned according to the 10-20 International system of electrode placement. Electrical activity was reviewed with band pass filter of 1-70Hz , sensitivity of 7 uV/mm, display speed of 28mm/sec with a 60Hz  notched filter applied as appropriate. EEG data were recorded continuously and digitally stored.  Video monitoring was available and reviewed as appropriate. Description: No clear  posterior dominant rhythm was seen. Sleep was characterized by sleep spindles (12 to 14 Hz), maximal frontocentral region. EEG showed continuous generalized and maximal left frontal 3 to 6 Hz theta-delta slowing. Sharp waves were noted in left frontal region,at times periodic at 1hz . Hyperventilation and photic stimulation were not performed.   ABNORMALITY - Sharp wave, left frontal region - Continuous slow, generalized and maximal left frontal region IMPRESSION: This study showed evidence of epileptogenicity and cortical dysfunction arising from left frontal region likely secondary to underlying ICH. Additionally there is moderate diffuse encephalopathy. No seizures were seen throughout the recording. If suspicion for ictal activity remains a concern, a prolonged study can be considered. Charlsie Quest   DG Abd Portable 1V Result Date: 12/03/2023 CLINICAL DATA:  73 year old male feeding tube placement. EXAM: PORTABLE ABDOMEN - 1 VIEW COMPARISON:  12/01/2023 and earlier. FINDINGS: Portable AP semi upright view at 1038 hours. Enteric feeding tube is in place within the proximal stomach, looped at the level of the gastric fundus. Negative lung bases. Nonobstructed bowel gas pattern. No acute osseous abnormality identified. IMPRESSION: Enteric feeding tube placed into the proximal stomach. Electronically Signed   By: Odessa Fleming M.D.   On: 12/03/2023 11:19   CT HEAD WO CONTRAST ( ) Result Date: 12/02/2023 CLINICAL DATA:  73 year old male with multifocal intracranial hemorrhage status post fall. Hemorrhage progression after presentation. EXAM: CT HEAD WITHOUT CONTRAST TECHNIQUE: Contiguous axial images were obtained from the base of the skull through the vertex without intravenous contrast. RADIATION DOSE REDUCTION: This exam was performed according to the departmental dose-optimization program which includes automated exposure control, adjustment of the mA and/or kV according to patient size and/or use of  iterative reconstruction technique. COMPARISON:  CTA head and neck this morning. Head CT yesterday and earlier. FINDINGS: Brain: Large volume mixed density but mostly hyperdense intra-axial hemorrhage in the anterior left frontal lobe encompasses 78 by 44 x 63 mm (AP by transverse by CC) versus 79 x 45 x 61 mm yesterday when measured with the same technique. Volume estimated at 108 mL, versus 105 mL on 11/30/2023, and hemorrhage size and configuration appears visually stable. Superimposed scattered bilateral subarachnoid blood and a moderate volume of mostly now layering lateral intraventricular blood also appears stable. Rightward midline shift at the anterior septum pellucidum of 7-8 mm has increased from 5-6 mm on 11/30/2023. Ventricle size and configuration is stable. Stable basilar cistern patency. Superimposed chronic right PCA territory encephalomalacia and confluent bilateral cerebral white matter hypodensity. Vascular: Calcified atherosclerosis at the skull base. No suspicious intracranial vascular hyperdensity. Skull: Stable, intact. Sinuses/Orbits: Visualized paranasal sinuses and mastoids are stable and well aerated. Other: Stable, negative orbit and scalp soft tissues. IMPRESSION: 1. Large volume anterior frontal lobe intra-axial hemorrhage does not appear significantly changed in size or configuration since 11/30/2023. Using largest orthogonal  dimensions, estimated blood volume is 108 mL now vs. 105 mL then. 2. Intracranial mass effect with mildly increased rightward midline shift since 11/30/2023, now 7-8 mm. Basilar cisterns remain patent. 3. Stable bilateral SAH and IVH. Stable ventricle size and configuration. 4. Chronic right PCA territory infarct. Nonspecific white matter changes. Electronically Signed   By: Odessa Fleming M.D.   On: 12/02/2023 11:47   CT ANGIO HEAD NECK W WO CM Result Date: 12/02/2023 CLINICAL DATA:  73 year old male with multifocal intracranial hemorrhage status post fall. EXAM: CT  ANGIOGRAPHY HEAD AND NECK TECHNIQUE: Multidetector CT imaging of the head and neck was performed using the standard protocol during bolus administration of intravenous contrast. Multiplanar CT image reconstructions and MIPs were obtained to evaluate the vascular anatomy. Carotid stenosis measurements (when applicable) are obtained utilizing NASCET criteria, using the distal internal carotid diameter as the denominator. RADIATION DOSE REDUCTION: This exam was performed according to the departmental dose-optimization program which includes automated exposure control, adjustment of the mA and/or kV according to patient size and/or use of iterative reconstruction technique. CONTRAST:  75mL OMNIPAQUE IOHEXOL 350 MG/ML SOLN COMPARISON:  Head CTs 12/01/2023 and earlier. Brain MRI 11/15/2023. FINDINGS: CTA NECK Skeleton: Cervical spine degeneration. 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 No acute osseous abnormality identified. Upper chest: Upper lungs are clear. Negative visible superior mediastinum. Other neck: Neck soft tissue spaces appear within normal limits. Aortic arch: Calcified aortic atherosclerosis.  3 vessel arch. Right carotid system: No significant brachiocephalic artery or or right CCA origin atherosclerosis. Soft and calcified plaque of the right CCA before the bifurcation without stenosis. Bulky calcified atherosclerosis of the proximal right ICA beginning at the origin but most pronounced at the bulb. Subsequent distal bulb stenosis is numerically estimated at 60 % with respect to the distal vessel (series 7, image 98 and series 9, image 155). Right ICA remains patent to the skull base. Left carotid system: Minimal left CCA atherosclerosis before the bifurcation. Soft and calcified plaque at the left ICA origin and more pronounced at the bulb. Up to 50 % stenosis with respect to the distal vessel. Additional calcified plaque of the left ICA just below the  skull base without stenosis. Vertebral arteries: Mild proximal right subclavian atherosclerosis without stenosis. Calcified plaque at the right vertebral artery origin with only mild stenosis on series 8, image 145. Right vertebral is patent to the skull base with no significant plaque or stenosis. Proximal left subclavian artery soft and calcified plaque without stenosis. Mostly soft plaque at the left vertebral artery origin with up to moderate origin stenosis on series 8, image 135. Left vertebral remains patent and is fairly codominant in the neck. Mild left V3 segment calcified plaque with no additional significant stenosis to the skull base. CTA HEAD Posterior circulation: Bilateral V4 segment atherosclerosis. Bulky and severe right V4 calcified plaque and stenosis on series 5, image 145. The right vertebrobasilar junction remains patent. Contralateral moderate left V4 segment stenosis proximal to the left PICA origin which remains normal. Right PICA origin is patent. Patent left vertebrobasilar junction. Patent basilar artery without stenosis. Fetal type bilateral PCA origins. Patent SCA origins. Bilateral PCA branches are within normal limits. Anterior circulation: Both ICA siphons are patent. Left siphon cavernous segment calcified plaque is moderate with mild to moderate stenosis at the anterior genu. Normal left posterior communicating artery origin. Right ICA cavernous through supraclinoid segment calcified plaque is moderate with mild to moderate supraclinoid stenosis. Normal right posterior communicating artery origin.  Patent carotid termini. Patent MCA origins. Dominant right and diminutive or absent left ACA A1 segments. Anterior communicating artery and bilateral ACA branches are within normal limits. Left MCA M1 segment and bifurcation are patent without stenosis. There is mild mass effect on the left MCA branches which otherwise appear within normal limits. Right MCA M1 segment and trifurcation  are patent without stenosis. Right MCA branches are within normal limits. No CTA spot sign or abnormal vascularity associated with the left anterior superior frontal lobe intra-axial hemorrhage. Other findings: Grossly stable intracranial hemorrhage, mass effect from the head CT yesterday. Venous sinuses: Early contrast timing. Superior sagittal sinus, torcula, straight sinus, vein of Galen and internal cerebral veins, dominant appearing right transverse and sigmoid venous sinuses appear to be enhancing and patent. Anatomic variants: Fetal type bilateral PCA origins. Dominant right ACA A1. Review of the MIP images confirms the above findings IMPRESSION: 1. Negative for CTA spot sign in association with acute intracranial hemorrhage. Mild mass effect on left MCA vasculature. Negative for intracranial aneurysm, vascular malformation, or large vessel occlusion. 2. Atherosclerosis in the head and neck notable for: - 60% stenosis of the Right ICA bulb. - Moderate To Severe bilateral distal vertebral artery stenosis. - Moderate Left Vertebral Artery origin stenosis. - mild to moderate bilateral ICA siphon stenosis. 3.  Aortic Atherosclerosis (ICD10-I70.0). Electronically Signed   By: Odessa Fleming M.D.   On: 12/02/2023 06:41   DG Abd Portable 1V Result Date: 12/01/2023 CLINICAL DATA:  NG placement. EXAM: PORTABLE ABDOMEN - 1 VIEW COMPARISON:  CT abdomen pelvis dated 07/01/2013. FINDINGS: Feeding tube with tip in the left upper abdomen likely in the region of the gastric fundus. Large amount of stool throughout the colon. IMPRESSION: Feeding tube with tip in the region of the gastric fundus. Electronically Signed   By: Elgie Collard M.D.   On: 12/01/2023 18:55   CT HEAD WO CONTRAST ( ) Result Date: 12/01/2023 CLINICAL DATA:  Follow-up of intracranial hemorrhage. EXAM: CT HEAD WITHOUT CONTRAST TECHNIQUE: Contiguous axial images were obtained from the base of the skull through the vertex without intravenous contrast.  RADIATION DOSE REDUCTION: This exam was performed according to the departmental dose-optimization program which includes automated exposure control, adjustment of the mA and/or kV according to patient size and/or use of iterative reconstruction technique. COMPARISON:  None Available. FINDINGS: Brain: Redemonstrated intraparenchymal hematoma in the anterior left frontal lobe which measures 7.9 x 4.9 x 3.6 cm, previously measuring 7.8 x 5.2 x 3.4 cm when remeasured in a similar manner. Similar surrounding edema and associated mass effect. Approximately 5 mm rightward midline shift anteriorly similar to prior. Extension of hemorrhage into the adjacent left frontal horn with similar appearance of blood products in the left lateral ventricle. Increased blood products within the atrium and occipital horn of the right lateral ventricle. Slightly decreased blood products in the third ventricle. Small focus of subdural hemorrhage along the anterior falx. Scattered areas of subarachnoid hemorrhage along the right frontal lobe and right sylvian fissure again noted. Remote infarct in the right occipital lobe. Vascular: No hyperdense vessel or unexpected calcification. Skull: Normal. Negative for fracture or focal lesion. Sinuses/Orbits: No acute finding. Other: Mastoid air cells are clear. IMPRESSION: Similar appearance of intraparenchymal hematoma in the left frontal lobe with associated mass effect and 5 mm rightward midline shift. Additional small multi compartment intracranial hemorrhage as above. Slightly increased blood products within the atrium and occipital horn of the right lateral ventricle. Electronically Signed   By: Molli Hazard  Hunt M.D.   On: 12/01/2023 13:34   ECHOCARDIOGRAM COMPLETE Result Date: 11/30/2023    ECHOCARDIOGRAM REPORT   Patient Name:   NENG ALBEE Date of Exam: 11/30/2023 Medical Rec #:  [id number]        Height:       72.0 in Accession #:    [id number]       Weight:       147.0 lb Date of  Birth:  [birth year]         BSA:          1.869 m Patient Age:    72 years         BP:           129/68 mmHg Patient Gender: M                HR:           77 bpm. Exam Location:  Inpatient Procedure: 2D Echo (Both Spectral and Color Flow Doppler were utilized during            procedure). Indications:    intracerebral hemorrhage  History:        Patient has prior history of Echocardiogram examinations, most                 recent 01/02/2013. Risk Factors:Hypertension, Diabetes and                 Dyslipidemia.  Sonographer:    Delcie Roch RDCS Referring Phys: [phone] WHITNEY D HARRIS IMPRESSIONS  1. Left ventricular ejection fraction, by estimation, is 60 to 65%. The left ventricle has normal function. The left ventricle has no regional wall motion abnormalities. Left ventricular diastolic parameters were normal.  2. Right ventricular systolic function is normal. The right ventricular size is normal. There is mildly elevated pulmonary artery systolic pressure.  3. The mitral valve is degenerative. Mild mitral valve regurgitation. No evidence of mitral stenosis.  4. The aortic valve is tricuspid. There is mild calcification of the aortic valve. Aortic valve regurgitation is not visualized. Aortic valve sclerosis is present, with no evidence of aortic valve stenosis.  5. The inferior vena cava is normal in size with greater than 50% respiratory variability, suggesting right atrial pressure of 3 mmHg. FINDINGS  Left Ventricle: Left ventricular ejection fraction, by estimation, is 60 to 65%. The left ventricle has normal function. The left ventricle has no regional wall motion abnormalities. Strain imaging was not performed. The left ventricular internal cavity  size was normal in size. There is no left ventricular hypertrophy. Left ventricular diastolic parameters were normal. Right Ventricle: The right ventricular size is normal. No increase in right ventricular wall thickness. Right ventricular systolic function is  normal. There is mildly elevated pulmonary artery systolic pressure. The tricuspid regurgitant velocity is 2.89  m/s, and with an assumed right atrial pressure of 3 mmHg, the estimated right ventricular systolic pressure is 36.4 mmHg. Left Atrium: Left atrial size was normal in size. Right Atrium: Right atrial size was normal in size. Pericardium: There is no evidence of pericardial effusion. Mitral Valve: The mitral valve is degenerative in appearance. There is mild calcification of the anterior mitral valve leaflet(s). Mild mitral valve regurgitation. No evidence of mitral valve stenosis. Tricuspid Valve: The tricuspid valve is grossly normal. Tricuspid valve regurgitation is mild . No evidence of tricuspid stenosis. Aortic Valve: The aortic valve is tricuspid. There is mild calcification of the aortic valve. Aortic valve regurgitation is not visualized.  Aortic valve sclerosis is present, with no evidence of aortic valve stenosis. Pulmonic Valve: The pulmonic valve was grossly normal. Pulmonic valve regurgitation is not visualized. No evidence of pulmonic stenosis. Aorta: The aortic root and ascending aorta are structurally normal, with no evidence of dilitation. Venous: The inferior vena cava is normal in size with greater than 50% respiratory variability, suggesting right atrial pressure of 3 mmHg. IAS/Shunts: The atrial septum is grossly normal. Additional Comments: 3D imaging was not performed.  LEFT VENTRICLE PLAX 2D LVIDd:         3.20 cm   Diastology LVIDs:         1.70 cm   LV e' medial:    8.16 cm/s LV PW:         1.20 cm   LV E/e' medial:  10.7 LV IVS:        1.00 cm   LV e' lateral:   11.20 cm/s LVOT diam:     2.40 cm   LV E/e' lateral: 7.8 LV SV:         115 LV SV Index:   61 LVOT Area:     4.52 cm  RIGHT VENTRICLE             IVC RV Basal diam:  2.80 cm     IVC diam: 1.70 cm RV S prime:     12.90 cm/s TAPSE (M-mode): 2.2 cm LEFT ATRIUM             Index        RIGHT ATRIUM           Index LA diam:         3.30 cm 1.77 cm/m   RA Area:     13.00 cm LA Vol (A2C):   45.6 ml 24.39 ml/m  RA Volume:   29.50 ml  15.78 ml/m LA Vol (A4C):   39.1 ml 20.92 ml/m LA Biplane Vol: 43.8 ml 23.43 ml/m  AORTIC VALVE LVOT Vmax:   125.00 cm/s LVOT Vmean:  83.200 cm/s LVOT VTI:    0.254 m  AORTA Ao Root diam: 3.30 cm Ao Asc diam:  3.20 cm MITRAL VALVE               TRICUSPID VALVE MV Area (PHT): 3.37 cm    TR Peak grad:   33.4 mmHg MV Decel Time: 225 msec    TR Vmax:        289.00 cm/s MR Peak grad: 113.2 mmHg MR Mean grad: 73.0 mmHg    SHUNTS MR Vmax:      532.00 cm/s  Systemic VTI:  0.25 m MR Vmean:     403.0 cm/s   Systemic Diam: 2.40 cm MV E velocity: 87.30 cm/s MV A velocity: 70.40 cm/s MV E/A ratio:  1.24 Lennie Odor MD Electronically signed by Lennie Odor MD Signature Date/Time: 11/30/2023/8:57:24 PM    Final    CT Head Wo Contrast Result Date: 11/30/2023 CLINICAL DATA:  Follow-up head trauma EXAM: CT HEAD WITHOUT CONTRAST TECHNIQUE: Contiguous axial images were obtained from the base of the skull through the vertex without intravenous contrast. RADIATION DOSE REDUCTION: This exam was performed according to the departmental dose-optimization program which includes automated exposure control, adjustment of the mA and/or kV according to patient size and/or use of iterative reconstruction technique. COMPARISON:  Head CT from the same day at 2 a.m. FINDINGS: Brain: Growing hematoma in the anterior left frontal lobe, hemorrhagic area measuring 7.3 x 5 x 3.4  cm. The hemorrhage has now decompressed into the left lateral ventricle with tracking to the level of the fourth ventricle. No hydrocephalus. Patchy subarachnoid hemorrhage along the frontal lobes and sylvian fissures which appears similar. The hemorrhage and edema causes progressive swelling with anterior midline shift measuring 5 mm. Chronic right occipital infarct. Low-density in the cerebral white matter from chronic small vessel ischemia. Vascular: No hyperdense  vessel or unexpected calcification. Skull: No acute finding Sinuses/Orbits: No evidence of injury Critical Value/emergent results were called by telephone at the time of interpretation on 11/30/2023 at 8:45 am to provider Ogb[place], who verbally acknowledged these results. IMPRESSION: Progressive left frontal hematoma now measuring 60 cc with new intraventricular extension reaching the fourth ventricle. Patchy bilateral subarachnoid hemorrhage appears similar to prior. The growing hematoma and edema causes anterior midline shift by 5 mm. No hydrocephalus. Electronically Signed   By: Tiburcio Pea M.D.   On: 11/30/2023 08:46   CT Head Wo Contrast Result Date: 11/30/2023 CLINICAL DATA:  Head trauma, minor (Age >= 65y); Neck trauma (Age >= 65y) EXAM: CT HEAD WITHOUT CONTRAST CT CERVICAL SPINE WITHOUT CONTRAST TECHNIQUE: Multidetector CT imaging of the head and cervical spine was performed following the standard protocol without intravenous contrast. Multiplanar CT image reconstructions of the cervical spine were also generated. RADIATION DOSE REDUCTION: This exam was performed according to the departmental dose-optimization program which includes automated exposure control, adjustment of the mA and/or kV according to patient size and/or use of iterative reconstruction technique. COMPARISON:  MRI head November 15, 2023. FINDINGS: CT HEAD FINDINGS Brain: Acute intraparenchymal hemorrhage in the left frontal lobe measuring 2.6 cm with overlying extra-axial extension. Small volume of multifocal subarachnoid hemorrhage bilaterally along the right frontal convexity, left frontal convexity and layering within the left sylvian fissure. No midline shift. No hydrocephalus. Remote right occipital infarct. Vascular: Calcific atherosclerosis.  No dense vessel. Skull: No acute fracture. Sinuses/Orbits: Clear sinuses.  No acute orbital findings. Other: No mastoid effusions. CT CERVICAL SPINE FINDINGS Alignment: No substantial  sagittal subluxation. Skull base and vertebrae: No acute fracture. Soft tissues and spinal canal: No prevertebral fluid or swelling. No visible canal hematoma. Disc levels:  Moderate multilevel degenerative change. Upper chest: Visualized lung apices are clear. IMPRESSION: 1. Acute intraparenchymal hemorrhage in left frontal lobe with multifocal bilateral small volume subarachnoid hemorrhage. No midline shift. 2. No acute fracture or traumatic malalignment in the cervical spine. Findings discussed with Dr. Clayborne Dana via telephone at 2:19 a.m. Electronically Signed   By: Feliberto Harts M.D.   On: 11/30/2023 02:21   CT Cervical Spine Wo Contrast Result Date: 11/30/2023 CLINICAL DATA:  Head trauma, minor (Age >= 65y); Neck trauma (Age >= 65y) EXAM: CT HEAD WITHOUT CONTRAST CT CERVICAL SPINE WITHOUT CONTRAST TECHNIQUE: Multidetector CT imaging of the head and cervical spine was performed following the standard protocol without intravenous contrast. Multiplanar CT image reconstructions of the cervical spine were also generated. RADIATION DOSE REDUCTION: This exam was performed according to the departmental dose-optimization program which includes automated exposure control, adjustment of the mA and/or kV according to patient size and/or use of iterative reconstruction technique. COMPARISON:  MRI head November 15, 2023. FINDINGS: CT HEAD FINDINGS Brain: Acute intraparenchymal hemorrhage in the left frontal lobe measuring 2.6 cm with overlying extra-axial extension. Small volume of multifocal subarachnoid hemorrhage bilaterally along the right frontal convexity, left frontal convexity and layering within the left sylvian fissure. No midline shift. No hydrocephalus. Remote right occipital infarct. Vascular: Calcific atherosclerosis.  No dense vessel. Skull:  No acute fracture. Sinuses/Orbits: Clear sinuses.  No acute orbital findings. Other: No mastoid effusions. CT CERVICAL SPINE FINDINGS Alignment: No substantial  sagittal subluxation. Skull base and vertebrae: No acute fracture. Soft tissues and spinal canal: No prevertebral fluid or swelling. No visible canal hematoma. Disc levels:  Moderate multilevel degenerative change. Upper chest: Visualized lung apices are clear. IMPRESSION: 1. Acute intraparenchymal hemorrhage in left frontal lobe with multifocal bilateral small volume subarachnoid hemorrhage. No midline shift. 2. No acute fracture or traumatic malalignment in the cervical spine. Findings discussed with Dr. Clayborne Dana via telephone at 2:19 a.m. Electronically Signed   By: Feliberto Harts M.D.   On: 11/30/2023 02:21   DG Chest Portable 1 View Result Date: 11/30/2023 CLINICAL DATA:  Fall EXAM: PORTABLE CHEST 1 VIEW COMPARISON:  03/05/2012 FINDINGS: There is asymmetric airspace infiltrate within the right mid lung zone, possibly infectious or posttraumatic given the history of recent trauma. No pneumothorax or pleural effusion. Cardiac size within normal limits. Pulmonary vascularity is normal. No acute bone abnormality. IMPRESSION: 1. Asymmetric airspace infiltrate within the right mid lung zone, possibly infectious or posttraumatic given the history of recent trauma. Electronically Signed   By: Helyn Numbers M.D.   On: 11/30/2023 01:18   DG Pelvis Portable Result Date: 11/30/2023 CLINICAL DATA:  Fall EXAM: PORTABLE PELVIS 1-2 VIEWS COMPARISON:  None Available. FINDINGS: Left total hip arthroplasty has been performed. No acute fracture or dislocation. Mild right hip degenerative arthritis. Vascular calcifications noted. Soft tissues are otherwise unremarkable. IMPRESSION: 1. Left total hip arthroplasty. No acute fracture or dislocation. Electronically Signed   By: Helyn Numbers M.D.   On: 11/30/2023 01:17   DG HIP UNILAT W OR W/O PELVIS 2-3 VIEWS LEFT Result Date: 11/16/2023 CLINICAL DATA:  [id number] History of left hip hemiarthroplasty [id number] EXAM: DG HIP (WITH OR WITHOUT PELVIS) 2-3V LEFT COMPARISON:   11/15/2023 FINDINGS: Interval left hip arthroplasty, components projecting in expected location. No fracture or dislocation. Surgical clips project over the pubic bones. Patchy iliofemoral arterial calcifications. IMPRESSION: Left hip arthroplasty, without apparent complication. Electronically Signed   By: Corlis Leak M.D.   On: 11/16/2023 16:11   MR BRAIN WO CONTRAST Result Date: 11/15/2023 CLINICAL DATA:  Abnl CT Head, evaluate for pontine infarct EXAM: MRI HEAD WITHOUT CONTRAST TECHNIQUE: Multiplanar, multiecho pulse sequences of the brain and surrounding structures were obtained without intravenous contrast. COMPARISON:  CT head from today. FINDINGS: Brain: No acute infarction, hemorrhage, hydrocephalus, extra-axial collection or mass lesion. Remote right PCA territory infarct. Moderate T2/FLAIR hyperintensities in the white matter, compatible with chronic microvascular ischemic disease. Vascular: Major arterial flow voids are maintained at the skull base. Skull and upper cervical spine: Normal marrow signal. Sinuses/Orbits: Negative. Other: No sizable mastoid effusions. IMPRESSION: 1. No evidence of acute intracranial abnormality. 2. Remote right PCA territory infarct. Electronically Signed   By: Feliberto Harts M.D.   On: 11/15/2023 22:38   CT Head Wo Contrast Result Date: 11/15/2023 CLINICAL DATA:  Altered mental status, fall EXAM: CT HEAD WITHOUT CONTRAST TECHNIQUE: Contiguous axial images were obtained from the base of the skull through the vertex without intravenous contrast. RADIATION DOSE REDUCTION: This exam was performed according to the departmental dose-optimization program which includes automated exposure control, adjustment of the mA and/or kV according to patient size and/or use of iterative reconstruction technique. COMPARISON:  None Available. FINDINGS: Brain: No acute territorial infarction, hemorrhage or intracranial mass. Encephalomalacia at the right occipital lobe consistent with  chronic infarct. Atrophy and advanced chronic small  vessel ischemic changes of the white matter. Hypodensity at the pons, series 2, image 11 and sagittal series 6 image 26. Ventricles are nonenlarged Vascular: No hyperdense vessels. Carotid and vertebral calcifications. Skull: Normal. Negative for fracture or focal lesion. Sinuses/Orbits: No acute finding. Other: None IMPRESSION: 1. Negative for acute intracranial hemorrhage or mass. 2. Hypodensity at the pons, though some of this may be related to artifact, the finding is rounded with some sparing of peripheral tissue and acute or subacute pontine infarct cannot be excluded. MRI is recommended for further evaluation. 3. Atrophy and chronic small vessel ischemic changes of the white matter Electronically Signed   By: Jasmine Pang M.D.   On: 11/15/2023 18:51   DG Ankle Complete Left Result Date: 11/15/2023 CLINICAL DATA:  Fall with ankle pain EXAM: LEFT ANKLE COMPLETE - 3+ VIEW COMPARISON:  01/30/2012 FINDINGS: Lateral plate and multiple fixating screws with additional medial malleolar fixating screws. Chronic postsurgical, posttraumatic and post infectious changes involving the distal tibia, distal fibula and tibiotalar joint. Fused appearance across the tibiotalar joint and probable subtalar fusion as well. No definite acute fracture is seen. IMPRESSION: Chronic postsurgical, posttraumatic and post infectious changes involving the distal tibia, distal fibula and tibiotalar joint. No definite acute osseous abnormality. Electronically Signed   By: Jasmine Pang M.D.   On: 11/15/2023 18:09   DG Foot Complete Left Result Date: 11/15/2023 CLINICAL DATA:  Larey Seat 2 weeks ago with left foot pain. EXAM: LEFT FOOT - COMPLETE 3+ VIEW COMPARISON:  Left ankle 01/30/2012 FINDINGS: Diffuse bone demineralization. Degenerative changes in the interphalangeal joints, metatarsal-phalangeal, tarsometatarsal, and interphalangeal joints. Postoperative fusion of the ankle joint with  plate and screw fixation. No evidence of acute fracture or dislocation. Soft tissues are unremarkable. IMPRESSION: 1. No acute bony abnormalities. 2. Degenerative changes in the left foot. 3. Postoperative changes with surgical fusion of the left ankle including plate and screw fixation Electronically Signed   By: Burman Nieves M.D.   On: 11/15/2023 18:03   DG Hip Unilat W or Wo Pelvis 2-3 Views Left Result Date: 11/15/2023 CLINICAL DATA:  Fall 2 weeks ago. Dizziness with fall forward. Left foot pain. EXAM: DG HIP (WITH OR WITHOUT PELVIS) 2-3V LEFT COMPARISON:  None Available. FINDINGS: Transverse subcapital fracture of the left femoral neck with varus angulation of the fracture fragments. No inter trochanteric involvement is identified. No dislocation at the hip joint. Pelvis appears intact. SI joints and symphysis pubis are not displaced. No destructive or expansile bone lesions. Degenerative changes in the lower lumbar spine and both hips. Vascular calcifications. IMPRESSION: Acute transverse fracture of the left femoral neck with varus angulation of the fracture fragments. Degenerative changes. Electronically Signed   By: Burman Nieves M.D.   On: 11/15/2023 18:02     PHYSICAL EXAM  Temp:  [97.5 F (36.4 C)-99.7 F (37.6 C)] 98.6 F (37 C) (02/26 1051) Pulse Rate:  [72-92] 85 (02/26 1051) Resp:  [14-19] 17 (02/26 1051) BP: (89-132)/(54-72) 132/72 (02/26 1051) SpO2:  [98 %-100 %] 99 % (02/26 1051)  General - well nourished, well developed, in no apparent distress.     Cardiovascular - regular rhythm and rate   Neuro -  Awake, calm.  Eyes are closed, resists eye opening does not follow commands, non verbal. CN: left pupil 3mm right pupil 2mm appears irregular but reactive. No forced gaze deviation.  NG tube in place.  Motor: Bilateral upper arms are crossed to chest, left arm with stiffness to passive  movement, right  arm able to passively move, all extremities withdrawal to pain.   Mittens on bilaterally. Sensory: withdraws to stimuli b/l.    ASSESSMENT/PLAN Mr. William Todd is a 73 y.o. male with PMH of DM, HTN, HLD, prostate cancer, left foot fracture with prosthesis, dementia and recent fall with acute transverse fracture of the left femoral neck s/p hemiarthroplasty on 2/2. Post op delirium/confusion but MRI at that time no acute finding, no stroke or hemorrhage. No sign of CAA. He was put on ASA 81 bid for 4 weeks for DVT prophylaxis. He was discharged to SNF on 11/20/23. Readmitted on 2/15 with fall at SNF and left eyebrow laceration. CT head showed left frontal small ICH with bilateral small SAH, concerning for traumatic ICH with contusion. However repeat CT 2/16 showed significant enlarged hematoma on the left frontal area with multi-lobe configuration and IVH and SAH but no hydro with 5mm MLS.     ICH:  left frontal large ICH, etiology unclear, traumatic ICH vs. Spontaneous ICH. Less likely for hemorrhagic infarct CT head 2/16 left frontal small ICH with bilateral small SAH, concerning for traumatic ICH with contusion.  Repeat CT 2/16 showed significant enlarged hematoma on the left frontal area with multi-lobe configuration and IVH and SAH but no hydro with 5mm MLS.    Repeat CT 2/17 Similar appearance of intraparenchymal hematoma in the left frontal lobe with associated mass effect and 5 mm rightward midline shift. CTA head and neck - no spot sign. 60% R ICA bulb, b/l distal VA mod to severe stenosis,  mod left VA origin stenosis and mild to mod b/l ICA siphon CT repeat 2/18 large left frontal ICH 108cc vs. 2/17 105cc but with MLS of 7-8 mm.  MRI with and without unchanged size of large left frontal intraparenchymal hematoma with 4 mm of anterior rightward midline shift. Blood layering within both lateral ventricle. Subarachnoid blood over both hemisphere.  CT repeat 2/20 stable hematoma and MLS 2D Echo  EF 60-65% EEG left frontal sharp waves,  moderate diffuse  encephalopathy, no seizure LDL 57 HgbA1c 6.1 SCDs for VTE prophylaxis aspirin 81 mg daily bid prior to admission, now on No antithrombotic.  Keppra 500 -> 750 mg bid>off. Ongoing aggressive stroke risk factor management Therapy recommendations:  SNF Disposition:  pending  Cerebral edema Repeat CT 2/17 5 mm rightward midline shift. CT repeat 2/18 MLS of 7-8 mm.  CT repeat 2/20 stable hematoma and midline shift MRI Brain 2/22, MLS of 4 mm Sodium titrating down gradually.  Continue to monitor.   Hypertension Goal is normotensive   Hyperlipidemia Home meds:  lipitor 40  LDL 57, goal < 70 Consider to resume lipitor on discharge   Dysphagia Did not pass swallow Currently n.p.o. cortrak placed On tube feeding  Other Stroke Risk Factors Advanced age  Other Active Problems Recent fracture - fall with acute transverse fracture of the left femoral neck with varus angulation of the fracture fragments. Underwent hemiarthroplasty on 2/2.   Hospital day # 10   Gevena Mart DNP, ACNPC-AG  [facility]  73 year old with large left frontal intracranial hemorrhage.  He is minimally interactive.  Nonverbal and does not follow commands.  Core track is in place.  He is not swallowing.  Will have speech come to reevaluate.  Discussed PEG with patient's wife she is in agreement if he failed swallow evaluation.  Neurosurgery has signed off.  ATTENDING ATTESTATION:  Dr. Viviann Spare evaluated pt independently, reviewed imaging, chart, labs. Discussed and formulated plan  with the Resident/APP. Changes were made to the note where appropriate. Please see APP/resident note above for details.   Total 36 minutes spent on counseling patient and coordinating care, writing notes and reviewing chart.   Tulip Meharg,MD     To contact Stroke Continuity provider, please refer to [email or web address]. After hours, contact General Neurology

## 2023-12-10 NOTE — Care Management Important Message (Signed)
 Important Message  Patient Details  Name: William Todd MRN: [id number] Date of Birth: 1951/09/24   Important Message Given:  Yes - Medicare IM     [name] 12/10/2023, 2:53 PM

## 2023-12-11 DIAGNOSIS — I609 Nontraumatic subarachnoid hemorrhage, unspecified: Secondary | ICD-10-CM | POA: Diagnosis not present

## 2023-12-11 LAB — CBC
HCT: 26.5 % — ABNORMAL LOW (ref 39.0–52.0)
Hemoglobin: 8.3 g/dL — ABNORMAL LOW (ref 13.0–17.0)
MCH: 30.2 pg (ref 26.0–34.0)
MCHC: 31.3 g/dL (ref 30.0–36.0)
MCV: 96.4 fL (ref 80.0–100.0)
Platelets: 142 10*3/uL — ABNORMAL LOW (ref 150–400)
RBC: 2.75 MIL/uL — ABNORMAL LOW (ref 4.22–5.81)
RDW: 15.7 % — ABNORMAL HIGH (ref 11.5–15.5)
WBC: 10.8 10*3/uL — ABNORMAL HIGH (ref 4.0–10.5)
nRBC: 0.3 % — ABNORMAL HIGH (ref 0.0–0.2)

## 2023-12-11 LAB — BASIC METABOLIC PANEL
Anion gap: 10 (ref 5–15)
BUN: 36 mg/dL — ABNORMAL HIGH (ref 8–23)
CO2: 27 mmol/L (ref 22–32)
Calcium: 8.8 mg/dL — ABNORMAL LOW (ref 8.9–10.3)
Chloride: 109 mmol/L (ref 98–111)
Creatinine, Ser: 1.43 mg/dL — ABNORMAL HIGH (ref 0.61–1.24)
GFR, Estimated: 52 mL/min — ABNORMAL LOW (ref >60)
Glucose, Bld: 154 mg/dL — ABNORMAL HIGH (ref 70–99)
Potassium: 4.3 mmol/L (ref 3.5–5.1)
Sodium: 146 mmol/L — ABNORMAL HIGH (ref 135–145)

## 2023-12-11 LAB — MAGNESIUM: Magnesium: 2.4 mg/dL (ref 1.7–2.4)

## 2023-12-11 LAB — GLUCOSE, CAPILLARY
Glucose-Capillary: 150 mg/dL — ABNORMAL HIGH (ref 70–99)
Glucose-Capillary: 199 mg/dL — ABNORMAL HIGH (ref 70–99)
Glucose-Capillary: 168 mg/dL — ABNORMAL HIGH (ref 70–99)
Glucose-Capillary: 165 mg/dL — ABNORMAL HIGH (ref 70–99)
Glucose-Capillary: 156 mg/dL — ABNORMAL HIGH (ref 70–99)

## 2023-12-11 LAB — PHOSPHORUS: Phosphorus: 3.5 mg/dL (ref 2.5–4.6)

## 2023-12-11 MED ORDER — ATORVASTATIN CALCIUM 40 MG PO TABS
40.0000 mg | ORAL_TABLET | Freq: Every day | ORAL | Status: DC
  Administered 2023-12-12 – 2024-01-01 (×20): 40 mg
  Filled 2023-12-11 (×20): qty 1

## 2023-12-11 NOTE — Progress Notes (Addendum)
 STROKE TEAM PROGRESS NOTE   SUBJECTIVE (INTERVAL HISTORY)  Wife is at the bedside.  Wife states that he said good morning and yes today. No new neurological events overnight Neurological exam is unchanged   OBJECTIVE Temp:  [98 F (36.7 C)-99.5 F (37.5 C)] 98.8 F (37.1 C) (02/27 1143) Pulse Rate:  [84-94] 94 (02/27 1143) Cardiac Rhythm: Normal sinus rhythm (02/27 0725) Resp:  [16-18] 16 (02/27 1143) BP: (110-132)/(53-73) 119/64 (02/27 1143) SpO2:  [98 %-100 %] 99 % (02/27 0346) Weight:  [60.3 kg] 60.3 kg (02/27 0500)  Recent Labs  Lab 12/10/23 2024 12/10/23 2346 12/11/23 0345 12/11/23 0811 12/11/23 1217  GLUCAP 197* 157* 150* 199* 168*   Recent Labs  Lab 12/05/23 4098 12/05/23 1555 12/07/23 0848 12/07/23 1308 12/08/23 0525 12/09/23 0451 12/10/23 0627 12/11/23 0657  NA 154*   < > 155* 155* 153* 149* 148* 146*  K 4.2   < > 3.6  --  4.1 4.0 4.2 4.3  CL 121*   < > 118*  --  118* 117* 113* 109  CO2 27   < > 27  --  27 28 28 27   GLUCOSE 284*   < > 163*  --  202* 174* 171* 154*  BUN 25*   < > 34*  --  41* 34* 34* 36*  CREATININE 1.37*   < > 1.40*  --  1.79* 1.45* 1.46* 1.43*  CALCIUM 8.7*   < > 9.0  --  8.6* 8.3* 8.7* 8.8*  MG 2.1  --   --   --   --  2.4 2.3 2.4  PHOS  --   --   --   --   --  3.7 2.9 3.5   < > = values in this interval not displayed.   No results for input(s): "AST", "ALT", "ALKPHOS", "BILITOT", "PROT", "ALBUMIN" in the last 168 hours.  Recent Labs  Lab 12/05/23 0822 12/06/23 0415 12/09/23 0451 12/10/23 0627 12/11/23 0657  WBC 9.1 9.4 8.0 8.8 10.8*  HGB 8.2* 8.4* 8.1* 7.9* 8.3*  HCT 25.9* 27.0* 25.5* 25.0* 26.5*  MCV 95.9 95.1 95.1 95.4 96.4  PLT 137* 137* 131* 131* 142*   No results for input(s): "CKTOTAL", "CKMB", "CKMBINDEX", "TROPONINI" in the last 168 hours. No results for input(s): "LABPROT", "INR" in the last 72 hours.       Component Value Date/Time   CHOL 111 12/02/2023 0624   TRIG 63 12/02/2023 0624   HDL 41 12/02/2023  0624   CHOLHDL 2.7 12/02/2023 0624   VLDL 13 12/02/2023 0624   LDLCALC 57 12/02/2023 0624   Lab Results  Component Value Date   HGBA1C 6.1 (H) 12/02/2023    DG CHEST PORT 1 VIEW Result Date: 12/09/2023 CLINICAL DATA:  Fever EXAM: PORTABLE CHEST 1 VIEW COMPARISON:  12/05/2023 FINDINGS: Two frontal views of the chest and upper abdomen are obtained. Enteric catheter passes below diaphragm tip projecting over the gastric fundus. Cardiac silhouette is unremarkable. No airspace disease, effusion, or pneumothorax. No acute bony abnormalities. IMPRESSION: 1. Enteric catheter tip projecting over gastric fundus. 2. No acute intrathoracic process. Electronically Signed   By: Sharlet Salina M.D.   On: 12/09/2023 15:49   DG Abd Portable 1V Result Date: 12/06/2023 CLINICAL DATA:  Nasogastric tube placement. EXAM: PORTABLE ABDOMEN - 1 VIEW COMPARISON:  December 03, 2023. FINDINGS: Distal tip of feeding tube is seen in expected position of proximal stomach. IMPRESSION: Distal tip of feeding tube is seen in expected position of proximal  stomach. Electronically Signed   By: Lupita Raider M.D.   On: 12/06/2023 09:19   DG CHEST PORT 1 VIEW Result Date: 12/05/2023 CLINICAL DATA:  Fever EXAM: PORTABLE CHEST 1 VIEW COMPARISON:  11/29/2023 FINDINGS: Enteric tube tip at the distal esophagus. Lung fields are clear. Cardiac size is normal. Aortic atherosclerosis. IMPRESSION: No active disease. Enteric tube tip at the distal esophagus, recommend advancement for more optimal positioning. Electronically Signed   By: Jasmine Pang M.D.   On: 12/05/2023 21:13   MR BRAIN W WO CONTRAST Result Date: 12/05/2023 CLINICAL DATA:  Hemorrhagic stroke EXAM: MRI HEAD WITHOUT AND WITH CONTRAST TECHNIQUE: Multiplanar, multiecho pulse sequences of the brain and surrounding structures were obtained without and with intravenous contrast. CONTRAST:  6mL GADAVIST GADOBUTROL 1 MMOL/ML IV SOLN COMPARISON:  Head CT 12/04/2023 FINDINGS: Brain:  Unchanged size of large left frontal intraparenchymal hematoma. Blood layering within both lateral ventricles. Subarachnoid blood over both hemispheres. There is multifocal hyperintense T2-weighted signal within the white matter. Generalized volume loss. There is 4 mm of anterior rightward midline shift. The midline structures are normal. There is no abnormal contrast enhancement. Vascular: Normal flow voids. Skull and upper cervical spine: Normal calvarium and skull base. Visualized upper cervical spine and soft tissues are normal. Sinuses/Orbits:No paranasal sinus fluid levels or advanced mucosal thickening. No mastoid or middle ear effusion. Normal orbits. IMPRESSION: 1. Unchanged size of large left frontal intraparenchymal hematoma with 4 mm of anterior rightward midline shift. 2. Blood layering within both lateral ventricles. 3. Subarachnoid blood over both hemispheres. Electronically Signed   By: Deatra Robinson M.D.   On: 12/05/2023 19:24   CT HEAD WO CONTRAST ( ) Result Date: 12/04/2023 CLINICAL DATA:  73 year old male with multifocal intracranial hemorrhage after a fall. Subsequent encounter. EXAM: CT HEAD WITHOUT CONTRAST TECHNIQUE: Contiguous axial images were obtained from the base of the skull through the vertex without intravenous contrast. RADIATION DOSE REDUCTION: This exam was performed according to the departmental dose-optimization program which includes automated exposure control, adjustment of the mA and/or kV according to patient size and/or use of iterative reconstruction technique. COMPARISON:  12/02/2023 and earlier. FINDINGS: Brain: Left anterior frontal lobe intra-axial hemorrhage remains hyperdense and amorphous. Due to scan angulation the most direct comparison now is on sagittal images (series 6, image 39 today versus the same series and image 2 days ago), where hemorrhage size and configuration appear unchanged. Regional edema and mass effect also unchanged. Scattered bilateral  subarachnoid blood has not significantly changed. Small volume layering intraventricular hemorrhage has not significantly changed. Stable ventricle size and configuration, no ventriculomegaly. Stable mild rightward midline shift limited to the anterior septum pellucidum. Basilar cisterns are stable. Stable gray-white matter differentiation throughout the brain. Left frontal lobe edema surrounding the hematoma and chronic right PCA territory infarct with encephalomalacia. Vascular: No suspicious intracranial vascular hyperdensity. Skull: Intact, stable. Incidental large maxillary torus exostoses incidentally noted. Sinuses/Orbits: Left nasoenteric tube now in place. Visualized paranasal sinuses and mastoids are stable and well aerated. Other: Stable orbit and scalp soft tissues. IMPRESSION: 1. Continued stability of large anterior left frontal lobar hemorrhage, associated edema. Relatively mild intracranial mass effect, scattered small volume bilateral SAH, and IVH remain stable. 2. No new intracranial abnormality. Chronic right PCA territory infarct. Electronically Signed   By: Odessa Fleming M.D.   On: 12/04/2023 06:37   EEG adult Result Date: 12/03/2023 Charlsie Quest, MD     12/03/2023  4:09 PM Patient Name: ANGUS AMINI MRN: [id number] Epilepsy  Attending: Charlsie Quest Referring Physician/Provider: Elmer Picker, NP Date: 12/03/2023 Duration: 23.45 mins Patient history: 73yo M with left frontal ICH. EEG to evaluate for seizure Level of alertness: Awake, asleep AEDs during EEG study: LEV Technical aspects: This EEG study was done with scalp electrodes positioned according to the 10-20 International system of electrode placement. Electrical activity was reviewed with band pass filter of 1-70Hz , sensitivity of 7 uV/mm, display speed of 24mm/sec with a 60Hz  notched filter applied as appropriate. EEG data were recorded continuously and digitally stored.  Video monitoring was available and reviewed as appropriate.  Description: No clear posterior dominant rhythm was seen. Sleep was characterized by sleep spindles (12 to 14 Hz), maximal frontocentral region. EEG showed continuous generalized and maximal left frontal 3 to 6 Hz theta-delta slowing. Sharp waves were noted in left frontal region,at times periodic at 1hz . Hyperventilation and photic stimulation were not performed.   ABNORMALITY - Sharp wave, left frontal region - Continuous slow, generalized and maximal left frontal region IMPRESSION: This study showed evidence of epileptogenicity and cortical dysfunction arising from left frontal region likely secondary to underlying ICH. Additionally there is moderate diffuse encephalopathy. No seizures were seen throughout the recording. If suspicion for ictal activity remains a concern, a prolonged study can be considered. Charlsie Quest   DG Abd Portable 1V Result Date: 12/03/2023 CLINICAL DATA:  73 year old male feeding tube placement. EXAM: PORTABLE ABDOMEN - 1 VIEW COMPARISON:  12/01/2023 and earlier. FINDINGS: Portable AP semi upright view at 1038 hours. Enteric feeding tube is in place within the proximal stomach, looped at the level of the gastric fundus. Negative lung bases. Nonobstructed bowel gas pattern. No acute osseous abnormality identified. IMPRESSION: Enteric feeding tube placed into the proximal stomach. Electronically Signed   By: Odessa Fleming M.D.   On: 12/03/2023 11:19   CT HEAD WO CONTRAST ( ) Result Date: 12/02/2023 CLINICAL DATA:  73 year old male with multifocal intracranial hemorrhage status post fall. Hemorrhage progression after presentation. EXAM: CT HEAD WITHOUT CONTRAST TECHNIQUE: Contiguous axial images were obtained from the base of the skull through the vertex without intravenous contrast. RADIATION DOSE REDUCTION: This exam was performed according to the departmental dose-optimization program which includes automated exposure control, adjustment of the mA and/or kV according to patient size  and/or use of iterative reconstruction technique. COMPARISON:  CTA head and neck this morning. Head CT yesterday and earlier. FINDINGS: Brain: Large volume mixed density but mostly hyperdense intra-axial hemorrhage in the anterior left frontal lobe encompasses 78 by 44 x 63 mm (AP by transverse by CC) versus 79 x 45 x 61 mm yesterday when measured with the same technique. Volume estimated at 108 mL, versus 105 mL on 11/30/2023, and hemorrhage size and configuration appears visually stable. Superimposed scattered bilateral subarachnoid blood and a moderate volume of mostly now layering lateral intraventricular blood also appears stable. Rightward midline shift at the anterior septum pellucidum of 7-8 mm has increased from 5-6 mm on 11/30/2023. Ventricle size and configuration is stable. Stable basilar cistern patency. Superimposed chronic right PCA territory encephalomalacia and confluent bilateral cerebral white matter hypodensity. Vascular: Calcified atherosclerosis at the skull base. No suspicious intracranial vascular hyperdensity. Skull: Stable, intact. Sinuses/Orbits: Visualized paranasal sinuses and mastoids are stable and well aerated. Other: Stable, negative orbit and scalp soft tissues. IMPRESSION: 1. Large volume anterior frontal lobe intra-axial hemorrhage does not appear significantly changed in size or configuration since 11/30/2023. Using largest orthogonal dimensions, estimated blood volume is 108 mL now vs. 105 mL then.  2. Intracranial mass effect with mildly increased rightward midline shift since 11/30/2023, now 7-8 mm. Basilar cisterns remain patent. 3. Stable bilateral SAH and IVH. Stable ventricle size and configuration. 4. Chronic right PCA territory infarct. Nonspecific white matter changes. Electronically Signed   By: Odessa Fleming M.D.   On: 12/02/2023 11:47   CT ANGIO HEAD NECK W WO CM Result Date: 12/02/2023 CLINICAL DATA:  73 year old male with multifocal intracranial hemorrhage status post  fall. EXAM: CT ANGIOGRAPHY HEAD AND NECK TECHNIQUE: Multidetector CT imaging of the head and neck was performed using the standard protocol during bolus administration of intravenous contrast. Multiplanar CT image reconstructions and MIPs were obtained to evaluate the vascular anatomy. Carotid stenosis measurements (when applicable) are obtained utilizing NASCET criteria, using the distal internal carotid diameter as the denominator. RADIATION DOSE REDUCTION: This exam was performed according to the departmental dose-optimization program which includes automated exposure control, adjustment of the mA and/or kV according to patient size and/or use of iterative reconstruction technique. CONTRAST:  75mL OMNIPAQUE IOHEXOL 350 MG/ML SOLN COMPARISON:  Head CTs 12/01/2023 and earlier. Brain MRI 11/15/2023. FINDINGS: CTA NECK Skeleton: Cervical spine degeneration. 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 No acute osseous abnormality identified. Upper chest: Upper lungs are clear. Negative visible superior mediastinum. Other neck: Neck soft tissue spaces appear within normal limits. Aortic arch: Calcified aortic atherosclerosis.  3 vessel arch. Right carotid system: No significant brachiocephalic artery or or right CCA origin atherosclerosis. Soft and calcified plaque of the right CCA before the bifurcation without stenosis. Bulky calcified atherosclerosis of the proximal right ICA beginning at the origin but most pronounced at the bulb. Subsequent distal bulb stenosis is numerically estimated at 60 % with respect to the distal vessel (series 7, image 98 and series 9, image 155). Right ICA remains patent to the skull base. Left carotid system: Minimal left CCA atherosclerosis before the bifurcation. Soft and calcified plaque at the left ICA origin and more pronounced at the bulb. Up to 50 % stenosis with respect to the distal vessel. Additional calcified plaque of the left ICA  just below the skull base without stenosis. Vertebral arteries: Mild proximal right subclavian atherosclerosis without stenosis. Calcified plaque at the right vertebral artery origin with only mild stenosis on series 8, image 145. Right vertebral is patent to the skull base with no significant plaque or stenosis. Proximal left subclavian artery soft and calcified plaque without stenosis. Mostly soft plaque at the left vertebral artery origin with up to moderate origin stenosis on series 8, image 135. Left vertebral remains patent and is fairly codominant in the neck. Mild left V3 segment calcified plaque with no additional significant stenosis to the skull base. CTA HEAD Posterior circulation: Bilateral V4 segment atherosclerosis. Bulky and severe right V4 calcified plaque and stenosis on series 5, image 145. The right vertebrobasilar junction remains patent. Contralateral moderate left V4 segment stenosis proximal to the left PICA origin which remains normal. Right PICA origin is patent. Patent left vertebrobasilar junction. Patent basilar artery without stenosis. Fetal type bilateral PCA origins. Patent SCA origins. Bilateral PCA branches are within normal limits. Anterior circulation: Both ICA siphons are patent. Left siphon cavernous segment calcified plaque is moderate with mild to moderate stenosis at the anterior genu. Normal left posterior communicating artery origin. Right ICA cavernous through supraclinoid segment calcified plaque is moderate with mild to moderate supraclinoid stenosis. Normal right posterior communicating artery origin. Patent carotid termini. Patent MCA origins. Dominant right and diminutive or absent  left ACA A1 segments. Anterior communicating artery and bilateral ACA branches are within normal limits. Left MCA M1 segment and bifurcation are patent without stenosis. There is mild mass effect on the left MCA branches which otherwise appear within normal limits. Right MCA M1 segment and  trifurcation are patent without stenosis. Right MCA branches are within normal limits. No CTA spot sign or abnormal vascularity associated with the left anterior superior frontal lobe intra-axial hemorrhage. Other findings: Grossly stable intracranial hemorrhage, mass effect from the head CT yesterday. Venous sinuses: Early contrast timing. Superior sagittal sinus, torcula, straight sinus, vein of Galen and internal cerebral veins, dominant appearing right transverse and sigmoid venous sinuses appear to be enhancing and patent. Anatomic variants: Fetal type bilateral PCA origins. Dominant right ACA A1. Review of the MIP images confirms the above findings IMPRESSION: 1. Negative for CTA spot sign in association with acute intracranial hemorrhage. Mild mass effect on left MCA vasculature. Negative for intracranial aneurysm, vascular malformation, or large vessel occlusion. 2. Atherosclerosis in the head and neck notable for: - 60% stenosis of the Right ICA bulb. - Moderate To Severe bilateral distal vertebral artery stenosis. - Moderate Left Vertebral Artery origin stenosis. - mild to moderate bilateral ICA siphon stenosis. 3.  Aortic Atherosclerosis (ICD10-I70.0). Electronically Signed   By: Odessa Fleming M.D.   On: 12/02/2023 06:41   DG Abd Portable 1V Result Date: 12/01/2023 CLINICAL DATA:  NG placement. EXAM: PORTABLE ABDOMEN - 1 VIEW COMPARISON:  CT abdomen pelvis dated 07/01/2013. FINDINGS: Feeding tube with tip in the left upper abdomen likely in the region of the gastric fundus. Large amount of stool throughout the colon. IMPRESSION: Feeding tube with tip in the region of the gastric fundus. Electronically Signed   By: Elgie Collard M.D.   On: 12/01/2023 18:55   CT HEAD WO CONTRAST ( ) Result Date: 12/01/2023 CLINICAL DATA:  Follow-up of intracranial hemorrhage. EXAM: CT HEAD WITHOUT CONTRAST TECHNIQUE: Contiguous axial images were obtained from the base of the skull through the vertex without intravenous  contrast. RADIATION DOSE REDUCTION: This exam was performed according to the departmental dose-optimization program which includes automated exposure control, adjustment of the mA and/or kV according to patient size and/or use of iterative reconstruction technique. COMPARISON:  None Available. FINDINGS: Brain: Redemonstrated intraparenchymal hematoma in the anterior left frontal lobe which measures 7.9 x 4.9 x 3.6 cm, previously measuring 7.8 x 5.2 x 3.4 cm when remeasured in a similar manner. Similar surrounding edema and associated mass effect. Approximately 5 mm rightward midline shift anteriorly similar to prior. Extension of hemorrhage into the adjacent left frontal horn with similar appearance of blood products in the left lateral ventricle. Increased blood products within the atrium and occipital horn of the right lateral ventricle. Slightly decreased blood products in the third ventricle. Small focus of subdural hemorrhage along the anterior falx. Scattered areas of subarachnoid hemorrhage along the right frontal lobe and right sylvian fissure again noted. Remote infarct in the right occipital lobe. Vascular: No hyperdense vessel or unexpected calcification. Skull: Normal. Negative for fracture or focal lesion. Sinuses/Orbits: No acute finding. Other: Mastoid air cells are clear. IMPRESSION: Similar appearance of intraparenchymal hematoma in the left frontal lobe with associated mass effect and 5 mm rightward midline shift. Additional small multi compartment intracranial hemorrhage as above. Slightly increased blood products within the atrium and occipital horn of the right lateral ventricle. Electronically Signed   By: Emily Filbert M.D.   On: 12/01/2023 13:34   ECHOCARDIOGRAM COMPLETE Result  Date: 11/30/2023    ECHOCARDIOGRAM REPORT   Patient Name:   COURT GRACIA Date of Exam: 11/30/2023 Medical Rec #:  [id number]        Height:       72.0 in Accession #:    [id number]       Weight:       147.0 lb  Date of Birth:  06-10-1951         BSA:          1.869 m Patient Age:    72 years         BP:           129/68 mmHg Patient Gender: M                HR:           77 bpm. Exam Location:  Inpatient Procedure: 2D Echo (Both Spectral and Color Flow Doppler were utilized during            procedure). Indications:    intracerebral hemorrhage  History:        Patient has prior history of Echocardiogram examinations, most                 recent 01/02/2013. Risk Factors:Hypertension, Diabetes and                 Dyslipidemia.  Sonographer:    Delcie Roch RDCS Referring Phys: [phone] WHITNEY D HARRIS IMPRESSIONS  1. Left ventricular ejection fraction, by estimation, is 60 to 65%. The left ventricle has normal function. The left ventricle has no regional wall motion abnormalities. Left ventricular diastolic parameters were normal.  2. Right ventricular systolic function is normal. The right ventricular size is normal. There is mildly elevated pulmonary artery systolic pressure.  3. The mitral valve is degenerative. Mild mitral valve regurgitation. No evidence of mitral stenosis.  4. The aortic valve is tricuspid. There is mild calcification of the aortic valve. Aortic valve regurgitation is not visualized. Aortic valve sclerosis is present, with no evidence of aortic valve stenosis.  5. The inferior vena cava is normal in size with greater than 50% respiratory variability, suggesting right atrial pressure of 3 mmHg. FINDINGS  Left Ventricle: Left ventricular ejection fraction, by estimation, is 60 to 65%. The left ventricle has normal function. The left ventricle has no regional wall motion abnormalities. Strain imaging was not performed. The left ventricular internal cavity  size was normal in size. There is no left ventricular hypertrophy. Left ventricular diastolic parameters were normal. Right Ventricle: The right ventricular size is normal. No increase in right ventricular wall thickness. Right ventricular systolic  function is normal. There is mildly elevated pulmonary artery systolic pressure. The tricuspid regurgitant velocity is 2.89  m/s, and with an assumed right atrial pressure of 3 mmHg, the estimated right ventricular systolic pressure is 36.4 mmHg. Left Atrium: Left atrial size was normal in size. Right Atrium: Right atrial size was normal in size. Pericardium: There is no evidence of pericardial effusion. Mitral Valve: The mitral valve is degenerative in appearance. There is mild calcification of the anterior mitral valve leaflet(s). Mild mitral valve regurgitation. No evidence of mitral valve stenosis. Tricuspid Valve: The tricuspid valve is grossly normal. Tricuspid valve regurgitation is mild . No evidence of tricuspid stenosis. Aortic Valve: The aortic valve is tricuspid. There is mild calcification of the aortic valve. Aortic valve regurgitation is not visualized. Aortic valve sclerosis is present, with no evidence of aortic valve stenosis.  Pulmonic Valve: The pulmonic valve was grossly normal. Pulmonic valve regurgitation is not visualized. No evidence of pulmonic stenosis. Aorta: The aortic root and ascending aorta are structurally normal, with no evidence of dilitation. Venous: The inferior vena cava is normal in size with greater than 50% respiratory variability, suggesting right atrial pressure of 3 mmHg. IAS/Shunts: The atrial septum is grossly normal. Additional Comments: 3D imaging was not performed.  LEFT VENTRICLE PLAX 2D LVIDd:         3.20 cm   Diastology LVIDs:         1.70 cm   LV e' medial:    8.16 cm/s LV PW:         1.20 cm   LV E/e' medial:  10.7 LV IVS:        1.00 cm   LV e' lateral:   11.20 cm/s LVOT diam:     2.40 cm   LV E/e' lateral: 7.8 LV SV:         115 LV SV Index:   61 LVOT Area:     4.52 cm  RIGHT VENTRICLE             IVC RV Basal diam:  2.80 cm     IVC diam: 1.70 cm RV S prime:     12.90 cm/s TAPSE (M-mode): 2.2 cm LEFT ATRIUM             Index        RIGHT ATRIUM           Index  LA diam:        3.30 cm 1.77 cm/m   RA Area:     13.00 cm LA Vol (A2C):   45.6 ml 24.39 ml/m  RA Volume:   29.50 ml  15.78 ml/m LA Vol (A4C):   39.1 ml 20.92 ml/m LA Biplane Vol: 43.8 ml 23.43 ml/m  AORTIC VALVE LVOT Vmax:   125.00 cm/s LVOT Vmean:  83.200 cm/s LVOT VTI:    0.254 m  AORTA Ao Root diam: 3.30 cm Ao Asc diam:  3.20 cm MITRAL VALVE               TRICUSPID VALVE MV Area (PHT): 3.37 cm    TR Peak grad:   33.4 mmHg MV Decel Time: 225 msec    TR Vmax:        289.00 cm/s MR Peak grad: 113.2 mmHg MR Mean grad: 73.0 mmHg    SHUNTS MR Vmax:      532.00 cm/s  Systemic VTI:  0.25 m MR Vmean:     403.0 cm/s   Systemic Diam: 2.40 cm MV E velocity: 87.30 cm/s MV A velocity: 70.40 cm/s MV E/A ratio:  1.24 Lennie Odor MD Electronically signed by Lennie Odor MD Signature Date/Time: 11/30/2023/8:57:24 PM    Final    CT Head Wo Contrast Result Date: 11/30/2023 CLINICAL DATA:  Follow-up head trauma EXAM: CT HEAD WITHOUT CONTRAST TECHNIQUE: Contiguous axial images were obtained from the base of the skull through the vertex without intravenous contrast. RADIATION DOSE REDUCTION: This exam was performed according to the departmental dose-optimization program which includes automated exposure control, adjustment of the mA and/or kV according to patient size and/or use of iterative reconstruction technique. COMPARISON:  Head CT from the same day at 2 a.m. FINDINGS: Brain: Growing hematoma in the anterior left frontal lobe, hemorrhagic area measuring 7.3 x 5 x 3.4 cm. The hemorrhage has now decompressed into the left lateral ventricle with  tracking to the level of the fourth ventricle. No hydrocephalus. Patchy subarachnoid hemorrhage along the frontal lobes and sylvian fissures which appears similar. The hemorrhage and edema causes progressive swelling with anterior midline shift measuring 5 mm. Chronic right occipital infarct. Low-density in the cerebral white matter from chronic small vessel ischemia. Vascular:  No hyperdense vessel or unexpected calcification. Skull: No acute finding Sinuses/Orbits: No evidence of injury Critical Value/emergent results were called by telephone at the time of interpretation on 11/30/2023 at 8:45 am to provider Ogbata, who verbally acknowledged these results. IMPRESSION: Progressive left frontal hematoma now measuring 60 cc with new intraventricular extension reaching the fourth ventricle. Patchy bilateral subarachnoid hemorrhage appears similar to prior. The growing hematoma and edema causes anterior midline shift by 5 mm. No hydrocephalus. Electronically Signed   By: Tiburcio Pea M.D.   On: 11/30/2023 08:46   CT Head Wo Contrast Result Date: 11/30/2023 CLINICAL DATA:  Head trauma, minor (Age >= 65y); Neck trauma (Age >= 65y) EXAM: CT HEAD WITHOUT CONTRAST CT CERVICAL SPINE WITHOUT CONTRAST TECHNIQUE: Multidetector CT imaging of the head and cervical spine was performed following the standard protocol without intravenous contrast. Multiplanar CT image reconstructions of the cervical spine were also generated. RADIATION DOSE REDUCTION: This exam was performed according to the departmental dose-optimization program which includes automated exposure control, adjustment of the mA and/or kV according to patient size and/or use of iterative reconstruction technique. COMPARISON:  MRI head November 15, 2023. FINDINGS: CT HEAD FINDINGS Brain: Acute intraparenchymal hemorrhage in the left frontal lobe measuring 2.6 cm with overlying extra-axial extension. Small volume of multifocal subarachnoid hemorrhage bilaterally along the right frontal convexity, left frontal convexity and layering within the left sylvian fissure. No midline shift. No hydrocephalus. Remote right occipital infarct. Vascular: Calcific atherosclerosis.  No dense vessel. Skull: No acute fracture. Sinuses/Orbits: Clear sinuses.  No acute orbital findings. Other: No mastoid effusions. CT CERVICAL SPINE FINDINGS Alignment: No  substantial sagittal subluxation. Skull base and vertebrae: No acute fracture. Soft tissues and spinal canal: No prevertebral fluid or swelling. No visible canal hematoma. Disc levels:  Moderate multilevel degenerative change. Upper chest: Visualized lung apices are clear. IMPRESSION: 1. Acute intraparenchymal hemorrhage in left frontal lobe with multifocal bilateral small volume subarachnoid hemorrhage. No midline shift. 2. No acute fracture or traumatic malalignment in the cervical spine. Findings discussed with Dr. Clayborne Dana via telephone at 2:19 a.m. Electronically Signed   By: Feliberto Harts M.D.   On: 11/30/2023 02:21   CT Cervical Spine Wo Contrast Result Date: 11/30/2023 CLINICAL DATA:  Head trauma, minor (Age >= 65y); Neck trauma (Age >= 65y) EXAM: CT HEAD WITHOUT CONTRAST CT CERVICAL SPINE WITHOUT CONTRAST TECHNIQUE: Multidetector CT imaging of the head and cervical spine was performed following the standard protocol without intravenous contrast. Multiplanar CT image reconstructions of the cervical spine were also generated. RADIATION DOSE REDUCTION: This exam was performed according to the departmental dose-optimization program which includes automated exposure control, adjustment of the mA and/or kV according to patient size and/or use of iterative reconstruction technique. COMPARISON:  MRI head November 15, 2023. FINDINGS: CT HEAD FINDINGS Brain: Acute intraparenchymal hemorrhage in the left frontal lobe measuring 2.6 cm with overlying extra-axial extension. Small volume of multifocal subarachnoid hemorrhage bilaterally along the right frontal convexity, left frontal convexity and layering within the left sylvian fissure. No midline shift. No hydrocephalus. Remote right occipital infarct. Vascular: Calcific atherosclerosis.  No dense vessel. Skull: No acute fracture. Sinuses/Orbits: Clear sinuses.  No acute orbital findings. Other:  No mastoid effusions. CT CERVICAL SPINE FINDINGS Alignment: No  substantial sagittal subluxation. Skull base and vertebrae: No acute fracture. Soft tissues and spinal canal: No prevertebral fluid or swelling. No visible canal hematoma. Disc levels:  Moderate multilevel degenerative change. Upper chest: Visualized lung apices are clear. IMPRESSION: 1. Acute intraparenchymal hemorrhage in left frontal lobe with multifocal bilateral small volume subarachnoid hemorrhage. No midline shift. 2. No acute fracture or traumatic malalignment in the cervical spine. Findings discussed with Dr. Clayborne Dana via telephone at 2:19 a.m. Electronically Signed   By: Feliberto Harts M.D.   On: 11/30/2023 02:21   DG Chest Portable 1 View Result Date: 11/30/2023 CLINICAL DATA:  Fall EXAM: PORTABLE CHEST 1 VIEW COMPARISON:  03/05/2012 FINDINGS: There is asymmetric airspace infiltrate within the right mid lung zone, possibly infectious or posttraumatic given the history of recent trauma. No pneumothorax or pleural effusion. Cardiac size within normal limits. Pulmonary vascularity is normal. No acute bone abnormality. IMPRESSION: 1. Asymmetric airspace infiltrate within the right mid lung zone, possibly infectious or posttraumatic given the history of recent trauma. Electronically Signed   By: Helyn Numbers M.D.   On: 11/30/2023 01:18   DG Pelvis Portable Result Date: 11/30/2023 CLINICAL DATA:  Fall EXAM: PORTABLE PELVIS 1-2 VIEWS COMPARISON:  None Available. FINDINGS: Left total hip arthroplasty has been performed. No acute fracture or dislocation. Mild right hip degenerative arthritis. Vascular calcifications noted. Soft tissues are otherwise unremarkable. IMPRESSION: 1. Left total hip arthroplasty. No acute fracture or dislocation. Electronically Signed   By: Helyn Numbers M.D.   On: 11/30/2023 01:17   DG HIP UNILAT W OR W/O PELVIS 2-3 VIEWS LEFT Result Date: 11/16/2023 CLINICAL DATA:  [id number] History of left hip hemiarthroplasty [id number] EXAM: DG HIP (WITH OR WITHOUT PELVIS) 2-3V LEFT  COMPARISON:  11/15/2023 FINDINGS: Interval left hip arthroplasty, components projecting in expected location. No fracture or dislocation. Surgical clips project over the pubic bones. Patchy iliofemoral arterial calcifications. IMPRESSION: Left hip arthroplasty, without apparent complication. Electronically Signed   By: Corlis Leak M.D.   On: 11/16/2023 16:11   MR BRAIN WO CONTRAST Result Date: 11/15/2023 CLINICAL DATA:  Abnl CT Head, evaluate for pontine infarct EXAM: MRI HEAD WITHOUT CONTRAST TECHNIQUE: Multiplanar, multiecho pulse sequences of the brain and surrounding structures were obtained without intravenous contrast. COMPARISON:  CT head from today. FINDINGS: Brain: No acute infarction, hemorrhage, hydrocephalus, extra-axial collection or mass lesion. Remote right PCA territory infarct. Moderate T2/FLAIR hyperintensities in the white matter, compatible with chronic microvascular ischemic disease. Vascular: Major arterial flow voids are maintained at the skull base. Skull and upper cervical spine: Normal marrow signal. Sinuses/Orbits: Negative. Other: No sizable mastoid effusions. IMPRESSION: 1. No evidence of acute intracranial abnormality. 2. Remote right PCA territory infarct. Electronically Signed   By: Feliberto Harts M.D.   On: 11/15/2023 22:38   CT Head Wo Contrast Result Date: 11/15/2023 CLINICAL DATA:  Altered mental status, fall EXAM: CT HEAD WITHOUT CONTRAST TECHNIQUE: Contiguous axial images were obtained from the base of the skull through the vertex without intravenous contrast. RADIATION DOSE REDUCTION: This exam was performed according to the departmental dose-optimization program which includes automated exposure control, adjustment of the mA and/or kV according to patient size and/or use of iterative reconstruction technique. COMPARISON:  None Available. FINDINGS: Brain: No acute territorial infarction, hemorrhage or intracranial mass. Encephalomalacia at the right occipital lobe  consistent with chronic infarct. Atrophy and advanced chronic small vessel ischemic changes of the white matter. Hypodensity at the pons, series  2, image 11 and sagittal series 6 image 26. Ventricles are nonenlarged Vascular: No hyperdense vessels. Carotid and vertebral calcifications. Skull: Normal. Negative for fracture or focal lesion. Sinuses/Orbits: No acute finding. Other: None IMPRESSION: 1. Negative for acute intracranial hemorrhage or mass. 2. Hypodensity at the pons, though some of this may be related to artifact, the finding is rounded with some sparing of peripheral tissue and acute or subacute pontine infarct cannot be excluded. MRI is recommended for further evaluation. 3. Atrophy and chronic small vessel ischemic changes of the white matter Electronically Signed   By: Jasmine Pang M.D.   On: 11/15/2023 18:51   DG Ankle Complete Left Result Date: 11/15/2023 CLINICAL DATA:  Fall with ankle pain EXAM: LEFT ANKLE COMPLETE - 3+ VIEW COMPARISON:  01/30/2012 FINDINGS: Lateral plate and multiple fixating screws with additional medial malleolar fixating screws. Chronic postsurgical, posttraumatic and post infectious changes involving the distal tibia, distal fibula and tibiotalar joint. Fused appearance across the tibiotalar joint and probable subtalar fusion as well. No definite acute fracture is seen. IMPRESSION: Chronic postsurgical, posttraumatic and post infectious changes involving the distal tibia, distal fibula and tibiotalar joint. No definite acute osseous abnormality. Electronically Signed   By: Jasmine Pang M.D.   On: 11/15/2023 18:09   DG Foot Complete Left Result Date: 11/15/2023 CLINICAL DATA:  Larey Seat 2 weeks ago with left foot pain. EXAM: LEFT FOOT - COMPLETE 3+ VIEW COMPARISON:  Left ankle 01/30/2012 FINDINGS: Diffuse bone demineralization. Degenerative changes in the interphalangeal joints, metatarsal-phalangeal, tarsometatarsal, and interphalangeal joints. Postoperative fusion of the  ankle joint with plate and screw fixation. No evidence of acute fracture or dislocation. Soft tissues are unremarkable. IMPRESSION: 1. No acute bony abnormalities. 2. Degenerative changes in the left foot. 3. Postoperative changes with surgical fusion of the left ankle including plate and screw fixation Electronically Signed   By: Burman Nieves M.D.   On: 11/15/2023 18:03   DG Hip Unilat W or Wo Pelvis 2-3 Views Left Result Date: 11/15/2023 CLINICAL DATA:  Fall 2 weeks ago. Dizziness with fall forward. Left foot pain. EXAM: DG HIP (WITH OR WITHOUT PELVIS) 2-3V LEFT COMPARISON:  None Available. FINDINGS: Transverse subcapital fracture of the left femoral neck with varus angulation of the fracture fragments. No inter trochanteric involvement is identified. No dislocation at the hip joint. Pelvis appears intact. SI joints and symphysis pubis are not displaced. No destructive or expansile bone lesions. Degenerative changes in the lower lumbar spine and both hips. Vascular calcifications. IMPRESSION: Acute transverse fracture of the left femoral neck with varus angulation of the fracture fragments. Degenerative changes. Electronically Signed   By: Burman Nieves M.D.   On: 11/15/2023 18:02     PHYSICAL EXAM  Temp:  [98 F (36.7 C)-99.5 F (37.5 C)] 98.8 F (37.1 C) (02/27 1143) Pulse Rate:  [84-94] 94 (02/27 1143) Resp:  [16-18] 16 (02/27 1143) BP: (110-132)/(53-73) 119/64 (02/27 1143) SpO2:  [98 %-100 %] 99 % (02/27 0346) Weight:  [60.3 kg] 60.3 kg (02/27 0500)  General - well nourished, well developed, in no apparent distress.     Cardiovascular - regular rhythm and rate   Neuro -  Awake, calm.  Eyes are open, he is tracking this morning does not follow commands, non verbal. CN: left pupil 3mm right pupil 2mm appears irregular but reactive. No forced gaze deviation.  NG tube in place.  Motor: Right upper arm with no movement he does withdraw and localize in the left arm, withdraws to pain  bilaterally in lower extremities right side is weaker than left mittens on bilaterally. Sensory: withdraws to stimuli b/l.    ASSESSMENT/PLAN Mr. ROBBIE RIDEAUX is a 73 y.o. male with PMH of DM, HTN, HLD, prostate cancer, left foot fracture with prosthesis, dementia and recent fall with acute transverse fracture of the left femoral neck s/p hemiarthroplasty on 2/2. Post op delirium/confusion but MRI at that time no acute finding, no stroke or hemorrhage. No sign of CAA. He was put on ASA 81 bid for 4 weeks for DVT prophylaxis. He was discharged to SNF on 11/20/23. Readmitted on 2/15 with fall at SNF and left eyebrow laceration. CT head showed left frontal small ICH with bilateral small SAH, concerning for traumatic ICH with contusion. However repeat CT 2/16 showed significant enlarged hematoma on the left frontal area with multi-lobe configuration and IVH and SAH but no hydro with 5mm MLS.     ICH:  left frontal large ICH, etiology unclear, traumatic ICH vs. Spontaneous ICH. Less likely for hemorrhagic infarct CT head 2/16 left frontal small ICH with bilateral small SAH, concerning for traumatic ICH with contusion.  Repeat CT 2/16 showed significant enlarged hematoma on the left frontal area with multi-lobe configuration and IVH and SAH but no hydro with 5mm MLS.    Repeat CT 2/17 Similar appearance of intraparenchymal hematoma in the left frontal lobe with associated mass effect and 5 mm rightward midline shift. CTA head and neck - no spot sign. 60% R ICA bulb, b/l distal VA mod to severe stenosis,  mod left VA origin stenosis and mild to mod b/l ICA siphon CT repeat 2/18 large left frontal ICH 108cc vs. 2/17 105cc but with MLS of 7-8 mm.  MRI with and without unchanged size of large left frontal ICH with 4 mm MLS. Blood layering within both lateral ventricle. Subarachnoid blood over both hemisphere.  CT repeat 2/20 stable hematoma and MLS 2D Echo  EF 60-65% EEG left frontal sharp waves,  moderate  diffuse encephalopathy, no seizure LDL 57 HgbA1c 6.1 SCDs for VTE prophylaxis aspirin 81 mg daily bid prior to admission, now on No antithrombotic.  Keppra 500 -> 750 mg bid- >off On amantadine for arousal Ongoing aggressive stroke risk factor management Therapy recommendations:  SNF Disposition:  pending  Cerebral edema Repeat CT 2/17 5 mm rightward midline shift. CT repeat 2/18 MLS of 7-8 mm.  CT repeat 2/20 stable hematoma and midline shift MRI Brain 2/22, MLS of 4 mm Allow sodium trending down gradually Na 153--149--148--146  Hypertension BP stable On Norvasc 5, metoprolol 25 BP goal less than 160 Long-term BP goal normotensive  Hyperlipidemia Home meds:  lipitor 40  LDL 57, goal < 70 resume lipitor 40 Continue statin on discharge  AKI Creatinine 1.2--2.15--1.68--1.49--1.37--1.46--1.43 On tube feeding and free water BMP monitoring  Dysphagia Did not pass swallow Currently n.p.o. cortrak placed On tube feeding and free water 200 Q4  Other Stroke Risk Factors Advanced age  Other Active Problems Recent fracture - fall with acute transverse fracture of the left femoral neck with varus angulation of the fracture fragments. Underwent hemiarthroplasty on 2/2.  Anemia, hemoglobin 8.1--7.9--8.3 Leukocytosis WBC 8.8--10.8  Hospital day # 11  Gevena Mart DNP, ACNPC-AG  [facility]  ATTENDING NOTE: I reviewed above note and agree with the assessment and plan. Pt was seen and examined.   Wife is at the bedside. Pt eyes open spontaneously, able to move lips seems to say "good morning" to me, but not other language  output. With eyes open, he squinting right eye which wife stated it was normal for him. Not actively tracking but seems able to move eyes to sound on both sides. Eyes midline, pupils equal size. Mild right facial droop. Tongue protrusion not cooperative. RUE flaccid, LUE and LLE strong withdraw to pain and against gravity with localization to  pain. RLE mild withdraw to pain. Sensation, coordination not cooperative and gait not tested.   Patient mental status seems improved some, able to mouth some two words today.  Sodium gradually trending down, creatinine, WBC and hemoglobin improving.  BP goal less than 160, continue BP meds.  Continue amantadine for arousal, off Keppra.  Continue tube feeding.  PT and OT recommend SNF.  Will follow  For detailed assessment and plan, please refer to above/below as I have made changes wherever appropriate.   Marvel Plan, MD PhD Stroke Neurology 12/11/2023 6:53 PM     To contact Stroke Continuity provider, please refer to [email or web address]. After hours, contact General Neurology

## 2023-12-11 NOTE — Plan of Care (Incomplete)
 Wife is at the bedside. Pt eyes open spontaneously, able to move lips seems to say "good morning" to me, but not other language output. With eyes open, he squinting right eye which wife stated it was normal for him. Not actively tracking but seems able to move eyes to sound on both sides. Eyes midline, pupils equal size. Mild right facial droop. Tongue protrusion not cooperative. RUE flaccid, LUE and LLE strong withdraw to pain and against gravity with localization to pain. RLE mild withdraw to pain. Sensation, coordination not cooperative and gait not tested.

## 2023-12-11 NOTE — Plan of Care (Signed)
 No acute changes, conts on TF via cotrak.   Problem: Nutritional: Goal: Maintenance of adequate nutrition will improve 12/11/2023 1742 by Ileana Roup, RN Outcome: Not Met (add Reason) 12/11/2023 1742 by Ileana Roup, RN Outcome: Not Met (add Reason) Goal: Progress toward achieving an optimal weight will improve 12/11/2023 1742 by Ileana Roup, RN Outcome: Not Met (add Reason) 12/11/2023 1742 by Ileana Roup, RN Outcome: Not Met (add Reason)   Problem: Skin Integrity: Goal: Risk for impaired skin integrity will decrease 12/11/2023 1742 by Ileana Roup, RN Outcome: Not Met (add Reason) 12/11/2023 1742 by Ileana Roup, RN Outcome: Not Met (add Reason)   Problem: Education: Goal: Knowledge of General Education information will improve Description: Including pain rating scale, medication(s)/side effects and non-pharmacologic comfort measures 12/11/2023 1742 by Ileana Roup, RN Outcome: Not Met (add Reason) 12/11/2023 1742 by Ileana Roup, RN Outcome: Not Met (add Reason)   Problem: Activity: Goal: Risk for activity intolerance will decrease 12/11/2023 1742 by Ileana Roup, RN Outcome: Not Met (add Reason) 12/11/2023 1742 by Ileana Roup, RN Outcome: Not Met (add Reason)    Problem: Safety: Goal: Ability to remain free from injury will improve 12/11/2023 1742 by Ileana Roup, RN Outcome: Not Met (add Reason) 12/11/2023 1742 by Ileana Roup, RN Outcome: Not Met (add Reason)

## 2023-12-11 NOTE — TOC Progression Note (Signed)
 Transition of Care [facility]) - Progression Note    Patient Details  Name: William Todd MRN: [id number] Date of Birth: 1951/07/23  [facility] CM/SW Contact  Baldemar Lenis, [place] Phone Number: 12/11/2023, 12:40 PM  Clinical Narrative:   CSW noting per chart review that therapies signed off for patient not participating, patient would need long term care. CSW contacted [facility], patient had been there for rehab, to discuss long term care. [facility] not able to offer a bed for patient for long term care.  CSW met with patient's spouse at bedside to discuss long term placement and need to find another SNF for LTC. Spouse didn't want patient to return to [facility] anyway, would prefer that patient go home. CSW discussed with spouse about providing assistance with DME as well as education and supplies for the feeding tube, if the patient were to need it. CSW to follow.    Expected Discharge Plan: Home w Home Health Services Barriers to Discharge: Continued Medical Work up  Expected Discharge Plan and Services In-house Referral: Clinical Social Work, Hospice / Palliative Care     Living arrangements for the past 2 months: Single Family Home, Skilled Nursing Facility                                       Social Determinants of Health (SDOH) Interventions SDOH Screenings   Food Insecurity: No Food Insecurity (11/30/2023)  Housing: Low Risk  (11/30/2023)  Transportation Needs: No Transportation Needs (11/30/2023)  Utilities: Not At Risk (11/30/2023)  Social Connections: Moderately Integrated (11/30/2023)  Tobacco Use: Low Risk  (11/30/2023)    Readmission Risk Interventions    12/08/2023   12:40 PM  Readmission Risk Prevention Plan  Transportation Screening Complete  Medication Review (RN Care Manager) Complete  PCP or Specialist appointment within 3-5 days of discharge Complete  HRI or Home Care Consult Complete  SW Recovery Care/Counseling Consult Complete   Palliative Care Screening Complete  Skilled Nursing Facility Complete

## 2023-12-11 NOTE — Progress Notes (Signed)
 PROGRESS NOTE    William Todd  ZOX:[id number] DOB: 07-23-1951 DOA: 11/29/2023 PCP: Knox Royalty, MD   Brief Narrative:  This 73 year old Male with past medical history significant for hypertension, recurrents mechanical fall, protein caloric malnutrition, diabetes type 2, chronic kidney disease, prostate cancer 2014 who presents via EMS from [facility] after suffering ground-level fall at [facility].  Workup on admission revealed acute intraparenchymal hemorrhage of the left frontal lobe with multiple bilateral small volume SAH.  Neurosurgery was consulted with no surgical intervention recommended.  While boarding in the ED patient had repeat CT which showed ICH revealed progressive left frontal hematoma with intraventricular extension into the fourth ventricle resulting in anterior midline shift of 5 mm, no hydrocephalus.  Neurosurgery reviewed imaging and recommended admission to the ICU under CCM for close monitoring.   2/15 presented after ground-level fall from [facility] workup revealed ICH with bilateral [facility] 2/16 repeat CT while boarding in ED revealed progressive left frontal hematoma with intraventricular extension into the fourth ventricle resulting in anterior midline shift of 5 mm, no hydrocephalus.  Admission changed to ICU 2/18-CT angio, CT head with enlarging clot 2/20 - Stable left frontal hematoma with mild mass effect. 2/26: At this time bleeding has been stable for over a week.  Neurosurgery signed off.    Assessment & Plan:   Principal Problem:   Subarachnoid hemorrhage (HCC) Active Problems:   Intraparenchymal hemorrhage of brain (HCC)   Acute metabolic encephalopathy   History of closed left femoral fracture s/p arthroplasty (HCC)   Long-term memory impairment   Acute kidney injury superimposed on chronic kidney disease (HCC)   Essential hypertension   History of prostate cancer   Non-insulin dependent type 2 diabetes mellitus (HCC)   Normocytic anemia   History of mood  disorder   HLD (hyperlipidemia)   ICH (intracerebral hemorrhage) (HCC)   Protein-calorie malnutrition, severe  Left frontal intracerebral traumatic hematoma: Patient presented with B/L SAH sec.to trauma. Status post 7 days of prophylactic Keppra. CT head 74/16 left frontal small ICH with bilateral small SAH.  Traumatic contusion Repeat CT 2/16 showed significant and large hematoma of the left frontal area with multilobe configuration and IVH and SAH, 5 mm midline shift. CTA head and neck: 60% right ICA involved bilateral VA moderate to severe stenosis, moderate left VA origin stenosis and mild to moderate bilateral ICA; MRI with and without contrast unchanged size of large left frontal intraparenchymal hematoma with 4 mm anterior rightward midline shift.  Subarachnoid blood over both hemisphere. EEG showed left frontal sharp waves, moderate diffuse encephalopathy, no seizure. Patient was on aspirin prior to admission,  now off, No antithrombotic due to ICH. At this time bleeding has been stable for over a week.  Neurosurgery signed off. Still not been able to swallow.  Speech and swallow evaluation. Plan for PEG tube if failed swallow evaluation.  Family is agreeable.   Cerebral edema: Repeat CT 2/17, 5 mm rightward  midline shift. CT 2/18: MLS  7/61mm MRI: 2/22:MLS 4mm Sodium trending down, continue to follow.   AKI CKD stage IIIb: Patient creatinine peaked to 1.7 Trending down.  Continue to monitor   Hypernatremia: He received hypertonic Saline.  Currently hypertonic off. Continue to monitor sodium , Improving. On free water  Fever: UA and chest x-ray unremarkable.   Hypertension: Goal is normotensive. Continue Norvasc and metoprolol.   Diabetes type 2: Sliding scale insulin, 5 units tube feeding coverage every 4 hours   History of frailty with recurrent mechanical  falls:   Constipation: Continue MiraLAX and Senokot   Nutrition:, Severe protein caloric malnutrition On  tube feedings.     See wound care documentation below     Pressure Injury 11/30/23 Hip Left Stage 2 -  Partial thickness loss of dermis presenting as a shallow open injury with a red, pink wound bed without slough. (Active)  11/30/23 2000  Location: Hip  Location Orientation: Left  Staging: Stage 2 -  Partial thickness loss of dermis presenting as a shallow open injury with a red, pink wound bed without slough.  Wound Description (Comments):   Present on Admission:   Dressing Type Foam - Lift dressing to assess site every shift 12/08/23 2000     Pressure Injury 12/01/23 Knee Left;Posterior Stage 3 -  Full thickness tissue loss. Subcutaneous fat may be visible but bone, tendon or muscle are NOT exposed. (Active)  12/01/23 2000  Location: Knee  Location Orientation: Left;Posterior  Staging: Stage 3 -  Full thickness tissue loss. Subcutaneous fat may be visible but bone, tendon or muscle are NOT exposed.  Wound Description (Comments):   Present on Admission:   Dressing Type Foam - Lift dressing to assess site every shift 12/08/23 2000        Nutrition Problem: Severe Malnutrition Etiology: chronic illness       Signs/Symptoms: severe fat depletion, severe muscle depletion, percent weight loss       Interventions: Tube feeding, MVI, Refer to RD note for recommendations   Estimated body mass index is 18.18 kg/m as calculated from the following:   Height as of this encounter: 6' (1.829 m).   Weight as of this encounter: 60.8 kg.   DVT prophylaxis: Heparin Code Status:Full code Family Communication: No family at bed side. Disposition Plan:    Status is: Inpatient Remains inpatient appropriate because: Severity of illness    Consultants:  Neurology Neurosurgery  Procedures:  MRI , EEG Antimicrobials:  Anti-infectives (From admission, onward)    None      Subjective: Patient was seen and examined at bedside. Overnight events noted. Patient is cortrek tube for  feeding.  Patient partially following commands He was agitated and has partial mittens.  Speech / swallow pending.  Objective: Vitals:   12/11/23 0346 12/11/23 0500 12/11/23 0739 12/11/23 1143  BP: 125/64  131/73 119/64  Pulse: 88  91 94  Resp: 18  16 16   Temp: 98.8 F (37.1 C)  98 F (36.7 C) 98.8 F (37.1 C)  TempSrc: Oral   Oral  SpO2: 99%     Weight:  60.3 kg    Height:        Intake/Output Summary (Last 24 hours) at 12/11/2023 1417 Last data filed at 12/11/2023 1142 Gross per 24 hour  Intake --  Output 1501 ml  Net -1501 ml   Filed Weights   12/08/23 0408 12/09/23 0400 12/11/23 0500  Weight: 60 kg 60.8 kg 60.3 kg    Examination:  General exam: Appears calm and comfortable, deconditioned, not in any acute distress. Respiratory system: CTA Bilaterally. Respiratory effort normal.  RR 15 Cardiovascular system: S1 & S2 heard, RRR. No JVD, murmurs, rubs, gallops or clicks.  Gastrointestinal system: Abdomen is nondistended, soft and nontender.  Normal bowel sounds heard. Central nervous system: Alert and oriented X 1. No focal neurological deficits. Extremities: No edema, no cyanosis, no clubbing Skin: No rashes, lesions or ulcers Psychiatry: Mood & affect appropriate.     Data Reviewed: I have personally reviewed following labs  and imaging studies  CBC: Recent Labs  Lab 12/05/23 0822 12/06/23 0415 12/09/23 0451 12/10/23 0627 12/11/23 0657  WBC 9.1 9.4 8.0 8.8 10.8*  HGB 8.2* 8.4* 8.1* 7.9* 8.3*  HCT 25.9* 27.0* 25.5* 25.0* 26.5*  MCV 95.9 95.1 95.1 95.4 96.4  PLT 137* 137* 131* 131* 142*   Basic Metabolic Panel: Recent Labs  Lab 12/05/23 0822 12/05/23 1555 12/07/23 0848 12/07/23 1308 12/08/23 0525 12/09/23 0451 12/10/23 0627 12/11/23 0657  NA 154*   < > 155* 155* 153* 149* 148* 146*  K 4.2   < > 3.6  --  4.1 4.0 4.2 4.3  CL 121*   < > 118*  --  118* 117* 113* 109  CO2 27   < > 27  --  27 28 28 27   GLUCOSE 284*   < > 163*  --  202* 174* 171* 154*   BUN 25*   < > 34*  --  41* 34* 34* 36*  CREATININE 1.37*   < > 1.40*  --  1.79* 1.45* 1.46* 1.43*  CALCIUM 8.7*   < > 9.0  --  8.6* 8.3* 8.7* 8.8*  MG 2.1  --   --   --   --  2.4 2.3 2.4  PHOS  --   --   --   --   --  3.7 2.9 3.5   < > = values in this interval not displayed.   GFR: Estimated Creatinine Clearance: 39.2 mL/min (A) (by C-G formula based on SCr of 1.43 mg/dL (H)). Liver Function Tests: No results for input(s): "AST", "ALT", "ALKPHOS", "BILITOT", "PROT", "ALBUMIN" in the last 168 hours. No results for input(s): "LIPASE", "AMYLASE" in the last 168 hours. No results for input(s): "AMMONIA" in the last 168 hours. Coagulation Profile: No results for input(s): "INR", "PROTIME" in the last 168 hours. Cardiac Enzymes: No results for input(s): "CKTOTAL", "CKMB", "CKMBINDEX", "TROPONINI" in the last 168 hours. BNP (last 3 results) No results for input(s): "PROBNP" in the last 8760 hours. HbA1C: No results for input(s): "HGBA1C" in the last 72 hours. CBG: Recent Labs  Lab 12/10/23 2024 12/10/23 2346 12/11/23 0345 12/11/23 0811 12/11/23 1217  GLUCAP 197* 157* 150* 199* 168*   Lipid Profile: No results for input(s): "CHOL", "HDL", "LDLCALC", "TRIG", "CHOLHDL", "LDLDIRECT" in the last 72 hours. Thyroid Function Tests: No results for input(s): "TSH", "T4TOTAL", "FREET4", "T3FREE", "THYROIDAB" in the last 72 hours. Anemia Panel: No results for input(s): "VITAMINB12", "FOLATE", "FERRITIN", "TIBC", "IRON", "RETICCTPCT" in the last 72 hours. Sepsis Labs: Recent Labs  Lab 12/08/23 0959  PROCALCITON 0.15    No results found for this or any previous visit (from the past 240 hours).   Radiology Studies: No results found.  Scheduled Meds:  amantadine  100 mg Per Tube BID   amLODipine  5 mg Per Tube Daily   feeding supplement (PROSource TF20)  60 mL Per Tube Daily   free water  200 mL Per Tube Q4H   heparin injection (subcutaneous)  5,000 Units Subcutaneous Q12H    insulin aspart  0-15 Units Subcutaneous Q4H   insulin aspart  5 Units Subcutaneous Q4H   metoCLOPramide  10 mg Per Tube TID AC & HS   metoprolol tartrate  50 mg Per Tube BID   multivitamin with minerals  1 tablet Per Tube Daily   mouth rinse  15 mL Mouth Rinse 4 times per day   polyethylene glycol  17 g Per Tube Daily   senna  1  tablet Per Tube BID   sodium chloride flush  3 mL Intravenous Q12H   Continuous Infusions:  feeding supplement (OSMOLITE 1.5 CAL) 1,000 mL (12/10/23 1953)     LOS: 11 days    Time spent: 35 mins    Willeen Niece, MD [facility]   If 7PM-7AM, please contact night-coverage

## 2023-12-12 ENCOUNTER — Inpatient Hospital Stay (HOSPITAL_COMMUNITY): Payer: Medicare HMO

## 2023-12-12 DIAGNOSIS — E785 Hyperlipidemia, unspecified: Secondary | ICD-10-CM | POA: Diagnosis not present

## 2023-12-12 DIAGNOSIS — G936 Cerebral edema: Secondary | ICD-10-CM

## 2023-12-12 DIAGNOSIS — I609 Nontraumatic subarachnoid hemorrhage, unspecified: Secondary | ICD-10-CM | POA: Diagnosis not present

## 2023-12-12 DIAGNOSIS — I619 Nontraumatic intracerebral hemorrhage, unspecified: Secondary | ICD-10-CM | POA: Diagnosis not present

## 2023-12-12 DIAGNOSIS — R1310 Dysphagia, unspecified: Secondary | ICD-10-CM

## 2023-12-12 DIAGNOSIS — Z9181 History of falling: Secondary | ICD-10-CM

## 2023-12-12 LAB — BASIC METABOLIC PANEL
Anion gap: 10 (ref 5–15)
BUN: 41 mg/dL — ABNORMAL HIGH (ref 8–23)
CO2: 26 mmol/L (ref 22–32)
Calcium: 8.4 mg/dL — ABNORMAL LOW (ref 8.9–10.3)
Chloride: 107 mmol/L (ref 98–111)
Creatinine, Ser: 1.4 mg/dL — ABNORMAL HIGH (ref 0.61–1.24)
GFR, Estimated: 53 mL/min — ABNORMAL LOW (ref >60)
Glucose, Bld: 142 mg/dL — ABNORMAL HIGH (ref 70–99)
Potassium: 4.3 mmol/L (ref 3.5–5.1)
Sodium: 143 mmol/L (ref 135–145)

## 2023-12-12 LAB — GLUCOSE, CAPILLARY
Glucose-Capillary: 151 mg/dL — ABNORMAL HIGH (ref 70–99)
Glucose-Capillary: 160 mg/dL — ABNORMAL HIGH (ref 70–99)
Glucose-Capillary: 182 mg/dL — ABNORMAL HIGH (ref 70–99)
Glucose-Capillary: 187 mg/dL — ABNORMAL HIGH (ref 70–99)
Glucose-Capillary: 196 mg/dL — ABNORMAL HIGH (ref 70–99)
Glucose-Capillary: 261 mg/dL — ABNORMAL HIGH (ref 70–99)
Glucose-Capillary: 264 mg/dL — ABNORMAL HIGH (ref 70–99)

## 2023-12-12 LAB — PHOSPHORUS: Phosphorus: 3.6 mg/dL (ref 2.5–4.6)

## 2023-12-12 LAB — CBC
HCT: 25.1 % — ABNORMAL LOW (ref 39.0–52.0)
Hemoglobin: 7.8 g/dL — ABNORMAL LOW (ref 13.0–17.0)
MCH: 29.9 pg (ref 26.0–34.0)
MCHC: 31.1 g/dL (ref 30.0–36.0)
MCV: 96.2 fL (ref 80.0–100.0)
Platelets: 163 10*3/uL (ref 150–400)
RBC: 2.61 MIL/uL — ABNORMAL LOW (ref 4.22–5.81)
RDW: 16.1 % — ABNORMAL HIGH (ref 11.5–15.5)
WBC: 11.4 10*3/uL — ABNORMAL HIGH (ref 4.0–10.5)
nRBC: 0.3 % — ABNORMAL HIGH (ref 0.0–0.2)

## 2023-12-12 LAB — MAGNESIUM: Magnesium: 2.5 mg/dL — ABNORMAL HIGH (ref 1.7–2.4)

## 2023-12-12 MED ORDER — PANTOPRAZOLE SODIUM 40 MG IV SOLR
40.0000 mg | Freq: Two times a day (BID) | INTRAVENOUS | Status: DC
  Administered 2023-12-12 – 2023-12-30 (×38): 40 mg via INTRAVENOUS
  Filled 2023-12-12 (×38): qty 10

## 2023-12-12 MED ORDER — GABAPENTIN 250 MG/5ML PO SOLN
100.0000 mg | Freq: Once | ORAL | Status: AC
  Administered 2023-12-12: 100 mg
  Filled 2023-12-12: qty 2

## 2023-12-12 MED ORDER — SODIUM CHLORIDE 0.9 % IV SOLN
12.5000 mg | Freq: Four times a day (QID) | INTRAVENOUS | Status: DC | PRN
  Administered 2023-12-12 – 2023-12-13 (×2): 12.5 mg via INTRAVENOUS
  Filled 2023-12-12 (×2): qty 0.5

## 2023-12-12 NOTE — Plan of Care (Signed)
 No acute changes. Awaiting swallow evaluation by speech. Conts on cotrack and TF w/flushes.   Problem: Fluid Volume: Goal: Ability to maintain a balanced intake and output will improve Outcome: Not Met (add Reason)   Problem: Skin Integrity: Goal: Risk for impaired skin integrity will decrease Outcome: Not Met (add Reason)   Problem: Tissue Perfusion: Goal: Adequacy of tissue perfusion will improve Outcome: Not Met (add Reason)   Problem: Pain Managment: Goal: General experience of comfort will improve and/or be controlled Outcome: Not Met (add Reason)   Problem: Nutrition: Goal: Adequate nutrition will be maintained Outcome: Not Met (add Reason)   Problem: Intracerebral Hemorrhage Tissue Perfusion: Goal: Complications of Intracerebral Hemorrhage will be minimized Outcome: Not Met (add Reason)

## 2023-12-12 NOTE — Progress Notes (Signed)
 STROKE TEAM PROGRESS NOTE   SUBJECTIVE (INTERVAL HISTORY) No family at bedside.  Patient eyes closed on my arrival, however arousable with stimulation, neuro unchanged.   OBJECTIVE Temp:  [98.3 F (36.8 C)-99.9 F (37.7 C)] 99.4 F (37.4 C) (02/28 1615) Pulse Rate:  [83-109] 96 (02/28 1615) Cardiac Rhythm: Normal sinus rhythm (02/28 0700) Resp:  [16-18] 17 (02/28 1615) BP: (115-134)/(64-84) 134/84 (02/28 1615) SpO2:  [98 %-100 %] 98 % (02/28 1615) Weight:  [60.5 kg] 60.5 kg (02/28 0500)  Recent Labs  Lab 12/12/23 0002 12/12/23 0430 12/12/23 0801 12/12/23 1057 12/12/23 1615  GLUCAP 182* 151* 187* 160* 264*   Recent Labs  Lab 12/08/23 0525 12/09/23 0451 12/10/23 0627 12/11/23 0657 12/12/23 1028  NA 153* 149* 148* 146* 143  K 4.1 4.0 4.2 4.3 4.3  CL 118* 117* 113* 109 107  CO2 27 28 28 27 26   GLUCOSE 202* 174* 171* 154* 142*  BUN 41* 34* 34* 36* 41*  CREATININE 1.79* 1.45* 1.46* 1.43* 1.40*  CALCIUM 8.6* 8.3* 8.7* 8.8* 8.4*  MG  --  2.4 2.3 2.4 2.5*  PHOS  --  3.7 2.9 3.5 3.6   No results for input(s): "AST", "ALT", "ALKPHOS", "BILITOT", "PROT", "ALBUMIN" in the last 168 hours.  Recent Labs  Lab 12/06/23 0415 12/09/23 0451 12/10/23 0627 12/11/23 0657 12/12/23 1028  WBC 9.4 8.0 8.8 10.8* 11.4*  HGB 8.4* 8.1* 7.9* 8.3* 7.8*  HCT 27.0* 25.5* 25.0* 26.5* 25.1*  MCV 95.1 95.1 95.4 96.4 96.2  PLT 137* 131* 131* 142* 163   No results for input(s): "CKTOTAL", "CKMB", "CKMBINDEX", "TROPONINI" in the last 168 hours. No results for input(s): "LABPROT", "INR" in the last 72 hours.       Component Value Date/Time   CHOL 111 12/02/2023 0624   TRIG 63 12/02/2023 0624   HDL 41 12/02/2023 0624   CHOLHDL 2.7 12/02/2023 0624   VLDL 13 12/02/2023 0624   LDLCALC 57 12/02/2023 0624   Lab Results  Component Value Date   HGBA1C 6.1 (H) 12/02/2023    CT HEAD WO CONTRAST ( ) Result Date: 12/12/2023 CLINICAL DATA:  Sudden hiccups in the setting of ICH, evaluate for  worsening bleed EXAM: CT HEAD WITHOUT CONTRAST TECHNIQUE: Contiguous axial images were obtained from the base of the skull through the vertex without intravenous contrast. RADIATION DOSE REDUCTION: This exam was performed according to the departmental dose-optimization program which includes automated exposure control, adjustment of the mA and/or kV according to patient size and/or use of iterative reconstruction technique. COMPARISON:  12/04/2023 FINDINGS: Brain: The patient's large left frontal hematoma has hazy margination, expected evolution. Regional edema is mildly increased, also expected. The hemorrhagic area and lobulated shape is unchanged, hemorrhagic area measuring up to 7.7 cm anterior to posterior and 4.3 cm craniocaudal. Similar degree of local mass effect and anterior midline shift measuring 7 mm at the anterior septum pellucidum. Layering blood clot in the lateral ventricles. Subarachnoid hemorrhage scattered along the convexities without interval increase. 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 No hydrocephalus or evidence of acute infarct. Vascular: No hyperdense vessel or unexpected calcification. Skull: Normal. Negative for fracture or focal lesion. Sinuses/Orbits: No acute finding. IMPRESSION: Expected evolution of large left frontal hematoma with intraventricular and subarachnoid extension. Anterior midline shift is similar to prior at 7 mm. No new abnormality. Electronically Signed   By: Tiburcio Pea M.D.   On: 12/12/2023 05:14   DG CHEST PORT 1 VIEW Result Date: 12/09/2023 CLINICAL DATA:  Fever  EXAM: PORTABLE CHEST 1 VIEW COMPARISON:  12/05/2023 FINDINGS: Two frontal views of the chest and upper abdomen are obtained. Enteric catheter passes below diaphragm tip projecting over the gastric fundus. Cardiac silhouette is unremarkable. No airspace disease, effusion, or pneumothorax. No acute bony abnormalities. IMPRESSION: 1. Enteric catheter tip projecting over  gastric fundus. 2. No acute intrathoracic process. Electronically Signed   By: Sharlet Salina M.D.   On: 12/09/2023 15:49   DG Abd Portable 1V Result Date: 12/06/2023 CLINICAL DATA:  Nasogastric tube placement. EXAM: PORTABLE ABDOMEN - 1 VIEW COMPARISON:  December 03, 2023. FINDINGS: Distal tip of feeding tube is seen in expected position of proximal stomach. IMPRESSION: Distal tip of feeding tube is seen in expected position of proximal stomach. Electronically Signed   By: Lupita Raider M.D.   On: 12/06/2023 09:19   DG CHEST PORT 1 VIEW Result Date: 12/05/2023 CLINICAL DATA:  Fever EXAM: PORTABLE CHEST 1 VIEW COMPARISON:  11/29/2023 FINDINGS: Enteric tube tip at the distal esophagus. Lung fields are clear. Cardiac size is normal. Aortic atherosclerosis. IMPRESSION: No active disease. Enteric tube tip at the distal esophagus, recommend advancement for more optimal positioning. Electronically Signed   By: Jasmine Pang M.D.   On: 12/05/2023 21:13   MR BRAIN W WO CONTRAST Result Date: 12/05/2023 CLINICAL DATA:  Hemorrhagic stroke EXAM: MRI HEAD WITHOUT AND WITH CONTRAST TECHNIQUE: Multiplanar, multiecho pulse sequences of the brain and surrounding structures were obtained without and with intravenous contrast. CONTRAST:  6mL GADAVIST GADOBUTROL 1 MMOL/ML IV SOLN COMPARISON:  Head CT 12/04/2023 FINDINGS: Brain: Unchanged size of large left frontal intraparenchymal hematoma. Blood layering within both lateral ventricles. Subarachnoid blood over both hemispheres. There is multifocal hyperintense T2-weighted signal within the white matter. Generalized volume loss. There is 4 mm of anterior rightward midline shift. The midline structures are normal. There is no abnormal contrast enhancement. Vascular: Normal flow voids. Skull and upper cervical spine: Normal calvarium and skull base. Visualized upper cervical spine and soft tissues are normal. Sinuses/Orbits:No paranasal sinus fluid levels or advanced mucosal  thickening. No mastoid or middle ear effusion. Normal orbits. IMPRESSION: 1. Unchanged size of large left frontal intraparenchymal hematoma with 4 mm of anterior rightward midline shift. 2. Blood layering within both lateral ventricles. 3. Subarachnoid blood over both hemispheres. Electronically Signed   By: Deatra Robinson M.D.   On: 12/05/2023 19:24   CT HEAD WO CONTRAST ( ) Result Date: 12/04/2023 CLINICAL DATA:  73 year old male with multifocal intracranial hemorrhage after a fall. Subsequent encounter. EXAM: CT HEAD WITHOUT CONTRAST TECHNIQUE: Contiguous axial images were obtained from the base of the skull through the vertex without intravenous contrast. RADIATION DOSE REDUCTION: This exam was performed according to the departmental dose-optimization program which includes automated exposure control, adjustment of the mA and/or kV according to patient size and/or use of iterative reconstruction technique. COMPARISON:  12/02/2023 and earlier. FINDINGS: Brain: Left anterior frontal lobe intra-axial hemorrhage remains hyperdense and amorphous. Due to scan angulation the most direct comparison now is on sagittal images (series 6, image 39 today versus the same series and image 2 days ago), where hemorrhage size and configuration appear unchanged. Regional edema and mass effect also unchanged. Scattered bilateral subarachnoid blood has not significantly changed. Small volume layering intraventricular hemorrhage has not significantly changed. Stable ventricle size and configuration, no ventriculomegaly. Stable mild rightward midline shift limited to the anterior septum pellucidum. Basilar cisterns are stable. Stable gray-white matter differentiation throughout the brain. Left frontal lobe edema surrounding the hematoma  and chronic right PCA territory infarct with encephalomalacia. Vascular: No suspicious intracranial vascular hyperdensity. Skull: Intact, stable. Incidental large maxillary torus exostoses  incidentally noted. Sinuses/Orbits: Left nasoenteric tube now in place. Visualized paranasal sinuses and mastoids are stable and well aerated. Other: Stable orbit and scalp soft tissues. IMPRESSION: 1. Continued stability of large anterior left frontal lobar hemorrhage, associated edema. Relatively mild intracranial mass effect, scattered small volume bilateral SAH, and IVH remain stable. 2. No new intracranial abnormality. Chronic right PCA territory infarct. Electronically Signed   By: Odessa Fleming M.D.   On: 12/04/2023 06:37   EEG adult Result Date: 12/03/2023 Charlsie Quest, MD     12/03/2023  4:09 PM Patient Name: William Todd MRN: [id number] Epilepsy Attending: Charlsie Quest Referring Physician/Provider: Elmer Picker, NP Date: 12/03/2023 Duration: 23.45 mins Patient history: 73yo M with left frontal ICH. EEG to evaluate for seizure Level of alertness: Awake, asleep AEDs during EEG study: LEV Technical aspects: This EEG study was done with scalp electrodes positioned according to the 10-20 International system of electrode placement. Electrical activity was reviewed with band pass filter of 1-70Hz , sensitivity of 7 uV/mm, display speed of 77mm/sec with a 60Hz  notched filter applied as appropriate. EEG data were recorded continuously and digitally stored.  Video monitoring was available and reviewed as appropriate. Description: No clear posterior dominant rhythm was seen. Sleep was characterized by sleep spindles (12 to 14 Hz), maximal frontocentral region. EEG showed continuous generalized and maximal left frontal 3 to 6 Hz theta-delta slowing. Sharp waves were noted in left frontal region,at times periodic at 1hz . Hyperventilation and photic stimulation were not performed.   ABNORMALITY - Sharp wave, left frontal region - Continuous slow, generalized and maximal left frontal region IMPRESSION: This study showed evidence of epileptogenicity and cortical dysfunction arising from left frontal region likely  secondary to underlying ICH. Additionally there is moderate diffuse encephalopathy. No seizures were seen throughout the recording. If suspicion for ictal activity remains a concern, a prolonged study can be considered. Charlsie Quest   DG Abd Portable 1V Result Date: 12/03/2023 CLINICAL DATA:  73 year old male feeding tube placement. EXAM: PORTABLE ABDOMEN - 1 VIEW COMPARISON:  12/01/2023 and earlier. FINDINGS: Portable AP semi upright view at 1038 hours. Enteric feeding tube is in place within the proximal stomach, looped at the level of the gastric fundus. Negative lung bases. Nonobstructed bowel gas pattern. No acute osseous abnormality identified. IMPRESSION: Enteric feeding tube placed into the proximal stomach. Electronically Signed   By: Odessa Fleming M.D.   On: 12/03/2023 11:19   CT HEAD WO CONTRAST ( ) Result Date: 12/02/2023 CLINICAL DATA:  73 year old male with multifocal intracranial hemorrhage status post fall. Hemorrhage progression after presentation. EXAM: CT HEAD WITHOUT CONTRAST TECHNIQUE: Contiguous axial images were obtained from the base of the skull through the vertex without intravenous contrast. RADIATION DOSE REDUCTION: This exam was performed according to the departmental dose-optimization program which includes automated exposure control, adjustment of the mA and/or kV according to patient size and/or use of iterative reconstruction technique. COMPARISON:  CTA head and neck this morning. Head CT yesterday and earlier. FINDINGS: Brain: Large volume mixed density but mostly hyperdense intra-axial hemorrhage in the anterior left frontal lobe encompasses 78 by 44 x 63 mm (AP by transverse by CC) versus 79 x 45 x 61 mm yesterday when measured with the same technique. Volume estimated at 108 mL, versus 105 mL on 11/30/2023, and hemorrhage size and configuration appears visually stable. Superimposed scattered bilateral  subarachnoid blood and a moderate volume of mostly now layering lateral  intraventricular blood also appears stable. Rightward midline shift at the anterior septum pellucidum of 7-8 mm has increased from 5-6 mm on 11/30/2023. Ventricle size and configuration is stable. Stable basilar cistern patency. Superimposed chronic right PCA territory encephalomalacia and confluent bilateral cerebral white matter hypodensity. Vascular: Calcified atherosclerosis at the skull base. No suspicious intracranial vascular hyperdensity. Skull: Stable, intact. Sinuses/Orbits: Visualized paranasal sinuses and mastoids are stable and well aerated. Other: Stable, negative orbit and scalp soft tissues. IMPRESSION: 1. Large volume anterior frontal lobe intra-axial hemorrhage does not appear significantly changed in size or configuration since 11/30/2023. Using largest orthogonal dimensions, estimated blood volume is 108 mL now vs. 105 mL then. 2. Intracranial mass effect with mildly increased rightward midline shift since 11/30/2023, now 7-8 mm. Basilar cisterns remain patent. 3. Stable bilateral SAH and IVH. Stable ventricle size and configuration. 4. Chronic right PCA territory infarct. Nonspecific white matter changes. Electronically Signed   By: Odessa Fleming M.D.   On: 12/02/2023 11:47   CT ANGIO HEAD NECK W WO CM Result Date: 12/02/2023 CLINICAL DATA:  73 year old male with multifocal intracranial hemorrhage status post fall. EXAM: CT ANGIOGRAPHY HEAD AND NECK TECHNIQUE: Multidetector CT imaging of the head and neck was performed using the standard protocol during bolus administration of intravenous contrast. Multiplanar CT image reconstructions and MIPs were obtained to evaluate the vascular anatomy. Carotid stenosis measurements (when applicable) are obtained utilizing NASCET criteria, using the distal internal carotid diameter as the denominator. RADIATION DOSE REDUCTION: This exam was performed according to the departmental dose-optimization program which includes automated exposure control, adjustment of  the mA and/or kV according to patient size and/or use of iterative reconstruction technique. CONTRAST:  75mL OMNIPAQUE IOHEXOL 350 MG/ML SOLN COMPARISON:  Head CTs 12/01/2023 and earlier. Brain MRI 11/15/2023. FINDINGS: CTA NECK Skeleton: Cervical spine degeneration. 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 No acute osseous abnormality identified. Upper chest: Upper lungs are clear. Negative visible superior mediastinum. Other neck: Neck soft tissue spaces appear within normal limits. Aortic arch: Calcified aortic atherosclerosis.  3 vessel arch. Right carotid system: No significant brachiocephalic artery or or right CCA origin atherosclerosis. Soft and calcified plaque of the right CCA before the bifurcation without stenosis. Bulky calcified atherosclerosis of the proximal right ICA beginning at the origin but most pronounced at the bulb. Subsequent distal bulb stenosis is numerically estimated at 60 % with respect to the distal vessel (series 7, image 98 and series 9, image 155). Right ICA remains patent to the skull base. Left carotid system: Minimal left CCA atherosclerosis before the bifurcation. Soft and calcified plaque at the left ICA origin and more pronounced at the bulb. Up to 50 % stenosis with respect to the distal vessel. Additional calcified plaque of the left ICA just below the skull base without stenosis. Vertebral arteries: Mild proximal right subclavian atherosclerosis without stenosis. Calcified plaque at the right vertebral artery origin with only mild stenosis on series 8, image 145. Right vertebral is patent to the skull base with no significant plaque or stenosis. Proximal left subclavian artery soft and calcified plaque without stenosis. Mostly soft plaque at the left vertebral artery origin with up to moderate origin stenosis on series 8, image 135. Left vertebral remains patent and is fairly codominant in the neck. Mild left V3 segment  calcified plaque with no additional significant stenosis to the skull base. CTA HEAD Posterior circulation: Bilateral V4 segment atherosclerosis. Bulky  and severe right V4 calcified plaque and stenosis on series 5, image 145. The right vertebrobasilar junction remains patent. Contralateral moderate left V4 segment stenosis proximal to the left PICA origin which remains normal. Right PICA origin is patent. Patent left vertebrobasilar junction. Patent basilar artery without stenosis. Fetal type bilateral PCA origins. Patent SCA origins. Bilateral PCA branches are within normal limits. Anterior circulation: Both ICA siphons are patent. Left siphon cavernous segment calcified plaque is moderate with mild to moderate stenosis at the anterior genu. Normal left posterior communicating artery origin. Right ICA cavernous through supraclinoid segment calcified plaque is moderate with mild to moderate supraclinoid stenosis. Normal right posterior communicating artery origin. Patent carotid termini. Patent MCA origins. Dominant right and diminutive or absent left ACA A1 segments. Anterior communicating artery and bilateral ACA branches are within normal limits. Left MCA M1 segment and bifurcation are patent without stenosis. There is mild mass effect on the left MCA branches which otherwise appear within normal limits. Right MCA M1 segment and trifurcation are patent without stenosis. Right MCA branches are within normal limits. No CTA spot sign or abnormal vascularity associated with the left anterior superior frontal lobe intra-axial hemorrhage. Other findings: Grossly stable intracranial hemorrhage, mass effect from the head CT yesterday. Venous sinuses: Early contrast timing. Superior sagittal sinus, torcula, straight sinus, vein of Galen and internal cerebral veins, dominant appearing right transverse and sigmoid venous sinuses appear to be enhancing and patent. Anatomic variants: Fetal type bilateral PCA origins. Dominant  right ACA A1. Review of the MIP images confirms the above findings IMPRESSION: 1. Negative for CTA spot sign in association with acute intracranial hemorrhage. Mild mass effect on left MCA vasculature. Negative for intracranial aneurysm, vascular malformation, or large vessel occlusion. 2. Atherosclerosis in the head and neck notable for: - 60% stenosis of the Right ICA bulb. - Moderate To Severe bilateral distal vertebral artery stenosis. - Moderate Left Vertebral Artery origin stenosis. - mild to moderate bilateral ICA siphon stenosis. 3.  Aortic Atherosclerosis (ICD10-I70.0). Electronically Signed   By: Odessa Fleming M.D.   On: 12/02/2023 06:41   DG Abd Portable 1V Result Date: 12/01/2023 CLINICAL DATA:  NG placement. EXAM: PORTABLE ABDOMEN - 1 VIEW COMPARISON:  CT abdomen pelvis dated 07/01/2013. FINDINGS: Feeding tube with tip in the left upper abdomen likely in the region of the gastric fundus. Large amount of stool throughout the colon. IMPRESSION: Feeding tube with tip in the region of the gastric fundus. Electronically Signed   By: Elgie Collard M.D.   On: 12/01/2023 18:55   CT HEAD WO CONTRAST ( ) Result Date: 12/01/2023 CLINICAL DATA:  Follow-up of intracranial hemorrhage. EXAM: CT HEAD WITHOUT CONTRAST TECHNIQUE: Contiguous axial images were obtained from the base of the skull through the vertex without intravenous contrast. RADIATION DOSE REDUCTION: This exam was performed according to the departmental dose-optimization program which includes automated exposure control, adjustment of the mA and/or kV according to patient size and/or use of iterative reconstruction technique. COMPARISON:  None Available. FINDINGS: Brain: Redemonstrated intraparenchymal hematoma in the anterior left frontal lobe which measures 7.9 x 4.9 x 3.6 cm, previously measuring 7.8 x 5.2 x 3.4 cm when remeasured in a similar manner. Similar surrounding edema and associated mass effect. Approximately 5 mm rightward midline shift  anteriorly similar to prior. Extension of hemorrhage into the adjacent left frontal horn with similar appearance of blood products in the left lateral ventricle. Increased blood products within the atrium and occipital horn of the right lateral ventricle. Slightly  decreased blood products in the third ventricle. Small focus of subdural hemorrhage along the anterior falx. Scattered areas of subarachnoid hemorrhage along the right frontal lobe and right sylvian fissure again noted. Remote infarct in the right occipital lobe. Vascular: No hyperdense vessel or unexpected calcification. Skull: Normal. Negative for fracture or focal lesion. Sinuses/Orbits: No acute finding. Other: Mastoid air cells are clear. IMPRESSION: Similar appearance of intraparenchymal hematoma in the left frontal lobe with associated mass effect and 5 mm rightward midline shift. Additional small multi compartment intracranial hemorrhage as above. Slightly increased blood products within the atrium and occipital horn of the right lateral ventricle. Electronically Signed   By: Emily Filbert M.D.   On: 12/01/2023 13:34   ECHOCARDIOGRAM COMPLETE Result Date: 11/30/2023    ECHOCARDIOGRAM REPORT   Patient Name:   MARLOS CARMEN Date of Exam: 11/30/2023 Medical Rec #:  [id number]        Height:       72.0 in Accession #:    [id number]       Weight:       147.0 lb Date of Birth:  [birth year]         BSA:          1.869 m Patient Age:    72 years         BP:           129/68 mmHg Patient Gender: M                HR:           77 bpm. Exam Location:  Inpatient Procedure: 2D Echo (Both Spectral and Color Flow Doppler were utilized during            procedure). Indications:    intracerebral hemorrhage  History:        Patient has prior history of Echocardiogram examinations, most                 recent 01/02/2013. Risk Factors:Hypertension, Diabetes and                 Dyslipidemia.  Sonographer:    Delcie Roch RDCS Referring Phys: [phone] WHITNEY D  HARRIS IMPRESSIONS  1. Left ventricular ejection fraction, by estimation, is 60 to 65%. The left ventricle has normal function. The left ventricle has no regional wall motion abnormalities. Left ventricular diastolic parameters were normal.  2. Right ventricular systolic function is normal. The right ventricular size is normal. There is mildly elevated pulmonary artery systolic pressure.  3. The mitral valve is degenerative. Mild mitral valve regurgitation. No evidence of mitral stenosis.  4. The aortic valve is tricuspid. There is mild calcification of the aortic valve. Aortic valve regurgitation is not visualized. Aortic valve sclerosis is present, with no evidence of aortic valve stenosis.  5. The inferior vena cava is normal in size with greater than 50% respiratory variability, suggesting right atrial pressure of 3 mmHg. FINDINGS  Left Ventricle: Left ventricular ejection fraction, by estimation, is 60 to 65%. The left ventricle has normal function. The left ventricle has no regional wall motion abnormalities. Strain imaging was not performed. The left ventricular internal cavity  size was normal in size. There is no left ventricular hypertrophy. Left ventricular diastolic parameters were normal. Right Ventricle: The right ventricular size is normal. No increase in right ventricular wall thickness. Right ventricular systolic function is normal. There is mildly elevated pulmonary artery systolic pressure. The tricuspid regurgitant velocity is 2.89  m/s,  and with an assumed right atrial pressure of 3 mmHg, the estimated right ventricular systolic pressure is 36.4 mmHg. Left Atrium: Left atrial size was normal in size. Right Atrium: Right atrial size was normal in size. Pericardium: There is no evidence of pericardial effusion. Mitral Valve: The mitral valve is degenerative in appearance. There is mild calcification of the anterior mitral valve leaflet(s). Mild mitral valve regurgitation. No evidence of mitral  valve stenosis. Tricuspid Valve: The tricuspid valve is grossly normal. Tricuspid valve regurgitation is mild . No evidence of tricuspid stenosis. Aortic Valve: The aortic valve is tricuspid. There is mild calcification of the aortic valve. Aortic valve regurgitation is not visualized. Aortic valve sclerosis is present, with no evidence of aortic valve stenosis. Pulmonic Valve: The pulmonic valve was grossly normal. Pulmonic valve regurgitation is not visualized. No evidence of pulmonic stenosis. Aorta: The aortic root and ascending aorta are structurally normal, with no evidence of dilitation. Venous: The inferior vena cava is normal in size with greater than 50% respiratory variability, suggesting right atrial pressure of 3 mmHg. IAS/Shunts: The atrial septum is grossly normal. Additional Comments: 3D imaging was not performed.  LEFT VENTRICLE PLAX 2D LVIDd:         3.20 cm   Diastology LVIDs:         1.70 cm   LV e' medial:    8.16 cm/s LV PW:         1.20 cm   LV E/e' medial:  10.7 LV IVS:        1.00 cm   LV e' lateral:   11.20 cm/s LVOT diam:     2.40 cm   LV E/e' lateral: 7.8 LV SV:         115 LV SV Index:   61 LVOT Area:     4.52 cm  RIGHT VENTRICLE             IVC RV Basal diam:  2.80 cm     IVC diam: 1.70 cm RV S prime:     12.90 cm/s TAPSE (M-mode): 2.2 cm LEFT ATRIUM             Index        RIGHT ATRIUM           Index LA diam:        3.30 cm 1.77 cm/m   RA Area:     13.00 cm LA Vol (A2C):   45.6 ml 24.39 ml/m  RA Volume:   29.50 ml  15.78 ml/m LA Vol (A4C):   39.1 ml 20.92 ml/m LA Biplane Vol: 43.8 ml 23.43 ml/m  AORTIC VALVE LVOT Vmax:   125.00 cm/s LVOT Vmean:  83.200 cm/s LVOT VTI:    0.254 m  AORTA Ao Root diam: 3.30 cm Ao Asc diam:  3.20 cm MITRAL VALVE               TRICUSPID VALVE MV Area (PHT): 3.37 cm    TR Peak grad:   33.4 mmHg MV Decel Time: 225 msec    TR Vmax:        289.00 cm/s MR Peak grad: 113.2 mmHg MR Mean grad: 73.0 mmHg    SHUNTS MR Vmax:      532.00 cm/s  Systemic VTI:   0.25 m MR Vmean:     403.0 cm/s   Systemic Diam: 2.40 cm MV E velocity: 87.30 cm/s MV A velocity: 70.40 cm/s MV E/A ratio:  1.24 Lennie Odor MD Electronically  signed by Lennie Odor MD Signature Date/Time: 11/30/2023/8:57:24 PM    Final    CT Head Wo Contrast Result Date: 11/30/2023 CLINICAL DATA:  Follow-up head trauma EXAM: CT HEAD WITHOUT CONTRAST TECHNIQUE: Contiguous axial images were obtained from the base of the skull through the vertex without intravenous contrast. RADIATION DOSE REDUCTION: This exam was performed according to the departmental dose-optimization program which includes automated exposure control, adjustment of the mA and/or kV according to patient size and/or use of iterative reconstruction technique. COMPARISON:  Head CT from the same day at 2 a.m. FINDINGS: Brain: Growing hematoma in the anterior left frontal lobe, hemorrhagic area measuring 7.3 x 5 x 3.4 cm. The hemorrhage has now decompressed into the left lateral ventricle with tracking to the level of the fourth ventricle. No hydrocephalus. Patchy subarachnoid hemorrhage along the frontal lobes and sylvian fissures which appears similar. The hemorrhage and edema causes progressive swelling with anterior midline shift measuring 5 mm. Chronic right occipital infarct. Low-density in the cerebral white matter from chronic small vessel ischemia. Vascular: No hyperdense vessel or unexpected calcification. Skull: No acute finding Sinuses/Orbits: No evidence of injury Critical Value/emergent results were called by telephone at the time of interpretation on 11/30/2023 at 8:45 am to provider O[place], who verbally acknowledged these results. IMPRESSION: Progressive left frontal hematoma now measuring 60 cc with new intraventricular extension reaching the fourth ventricle. Patchy bilateral subarachnoid hemorrhage appears similar to prior. The growing hematoma and edema causes anterior midline shift by 5 mm. No hydrocephalus. Electronically Signed    By: Tiburcio Pea M.D.   On: 11/30/2023 08:46   CT Head Wo Contrast Result Date: 11/30/2023 CLINICAL DATA:  Head trauma, minor (Age >= 65y); Neck trauma (Age >= 65y) EXAM: CT HEAD WITHOUT CONTRAST CT CERVICAL SPINE WITHOUT CONTRAST TECHNIQUE: Multidetector CT imaging of the head and cervical spine was performed following the standard protocol without intravenous contrast. Multiplanar CT image reconstructions of the cervical spine were also generated. RADIATION DOSE REDUCTION: This exam was performed according to the departmental dose-optimization program which includes automated exposure control, adjustment of the mA and/or kV according to patient size and/or use of iterative reconstruction technique. COMPARISON:  MRI head November 15, 2023. FINDINGS: CT HEAD FINDINGS Brain: Acute intraparenchymal hemorrhage in the left frontal lobe measuring 2.6 cm with overlying extra-axial extension. Small volume of multifocal subarachnoid hemorrhage bilaterally along the right frontal convexity, left frontal convexity and layering within the left sylvian fissure. No midline shift. No hydrocephalus. Remote right occipital infarct. Vascular: Calcific atherosclerosis.  No dense vessel. Skull: No acute fracture. Sinuses/Orbits: Clear sinuses.  No acute orbital findings. Other: No mastoid effusions. CT CERVICAL SPINE FINDINGS Alignment: No substantial sagittal subluxation. Skull base and vertebrae: No acute fracture. Soft tissues and spinal canal: No prevertebral fluid or swelling. No visible canal hematoma. Disc levels:  Moderate multilevel degenerative change. Upper chest: Visualized lung apices are clear. IMPRESSION: 1. Acute intraparenchymal hemorrhage in left frontal lobe with multifocal bilateral small volume subarachnoid hemorrhage. No midline shift. 2. No acute fracture or traumatic malalignment in the cervical spine. Findings discussed with Dr. Clayborne Dana via telephone at 2:19 a.m. Electronically Signed   By: Feliberto Harts M.D.   On: 11/30/2023 02:21   CT Cervical Spine Wo Contrast Result Date: 11/30/2023 CLINICAL DATA:  Head trauma, minor (Age >= 65y); Neck trauma (Age >= 65y) EXAM: CT HEAD WITHOUT CONTRAST CT CERVICAL SPINE WITHOUT CONTRAST TECHNIQUE: Multidetector CT imaging of the head and cervical spine was performed following the standard  protocol without intravenous contrast. Multiplanar CT image reconstructions of the cervical spine were also generated. RADIATION DOSE REDUCTION: This exam was performed according to the departmental dose-optimization program which includes automated exposure control, adjustment of the mA and/or kV according to patient size and/or use of iterative reconstruction technique. COMPARISON:  MRI head November 15, 2023. FINDINGS: CT HEAD FINDINGS Brain: Acute intraparenchymal hemorrhage in the left frontal lobe measuring 2.6 cm with overlying extra-axial extension. Small volume of multifocal subarachnoid hemorrhage bilaterally along the right frontal convexity, left frontal convexity and layering within the left sylvian fissure. No midline shift. No hydrocephalus. Remote right occipital infarct. Vascular: Calcific atherosclerosis.  No dense vessel. Skull: No acute fracture. Sinuses/Orbits: Clear sinuses.  No acute orbital findings. Other: No mastoid effusions. CT CERVICAL SPINE FINDINGS Alignment: No substantial sagittal subluxation. Skull base and vertebrae: No acute fracture. Soft tissues and spinal canal: No prevertebral fluid or swelling. No visible canal hematoma. Disc levels:  Moderate multilevel degenerative change. Upper chest: Visualized lung apices are clear. IMPRESSION: 1. Acute intraparenchymal hemorrhage in left frontal lobe with multifocal bilateral small volume subarachnoid hemorrhage. No midline shift. 2. No acute fracture or traumatic malalignment in the cervical spine. Findings discussed with Dr. Clayborne Dana via telephone at 2:19 a.m. Electronically Signed   By: Feliberto Harts  M.D.   On: 11/30/2023 02:21   DG Chest Portable 1 View Result Date: 11/30/2023 CLINICAL DATA:  Fall EXAM: PORTABLE CHEST 1 VIEW COMPARISON:  03/05/2012 FINDINGS: There is asymmetric airspace infiltrate within the right mid lung zone, possibly infectious or posttraumatic given the history of recent trauma. No pneumothorax or pleural effusion. Cardiac size within normal limits. Pulmonary vascularity is normal. No acute bone abnormality. IMPRESSION: 1. Asymmetric airspace infiltrate within the right mid lung zone, possibly infectious or posttraumatic given the history of recent trauma. Electronically Signed   By: Helyn Numbers M.D.   On: 11/30/2023 01:18   DG Pelvis Portable Result Date: 11/30/2023 CLINICAL DATA:  Fall EXAM: PORTABLE PELVIS 1-2 VIEWS COMPARISON:  None Available. FINDINGS: Left total hip arthroplasty has been performed. No acute fracture or dislocation. Mild right hip degenerative arthritis. Vascular calcifications noted. Soft tissues are otherwise unremarkable. IMPRESSION: 1. Left total hip arthroplasty. No acute fracture or dislocation. Electronically Signed   By: Helyn Numbers M.D.   On: 11/30/2023 01:17   DG HIP UNILAT W OR W/O PELVIS 2-3 VIEWS LEFT Result Date: 11/16/2023 CLINICAL DATA:  [id number] History of left hip hemiarthroplasty [id number] EXAM: DG HIP (WITH OR WITHOUT PELVIS) 2-3V LEFT COMPARISON:  11/15/2023 FINDINGS: Interval left hip arthroplasty, components projecting in expected location. No fracture or dislocation. Surgical clips project over the pubic bones. Patchy iliofemoral arterial calcifications. IMPRESSION: Left hip arthroplasty, without apparent complication. Electronically Signed   By: Corlis Leak M.D.   On: 11/16/2023 16:11   MR BRAIN WO CONTRAST Result Date: 11/15/2023 CLINICAL DATA:  Abnl CT Head, evaluate for pontine infarct EXAM: MRI HEAD WITHOUT CONTRAST TECHNIQUE: Multiplanar, multiecho pulse sequences of the brain and surrounding structures were obtained without  intravenous contrast. COMPARISON:  CT head from today. FINDINGS: Brain: No acute infarction, hemorrhage, hydrocephalus, extra-axial collection or mass lesion. Remote right PCA territory infarct. Moderate T2/FLAIR hyperintensities in the white matter, compatible with chronic microvascular ischemic disease. Vascular: Major arterial flow voids are maintained at the skull base. Skull and upper cervical spine: Normal marrow signal. Sinuses/Orbits: Negative. Other: No sizable mastoid effusions. IMPRESSION: 1. No evidence of acute intracranial abnormality. 2. Remote right PCA territory infarct. Electronically Signed  By: Feliberto Harts M.D.   On: 11/15/2023 22:38   CT Head Wo Contrast Result Date: 11/15/2023 CLINICAL DATA:  Altered mental status, fall EXAM: CT HEAD WITHOUT CONTRAST TECHNIQUE: Contiguous axial images were obtained from the base of the skull through the vertex without intravenous contrast. RADIATION DOSE REDUCTION: This exam was performed according to the departmental dose-optimization program which includes automated exposure control, adjustment of the mA and/or kV according to patient size and/or use of iterative reconstruction technique. COMPARISON:  None Available. FINDINGS: Brain: No acute territorial infarction, hemorrhage or intracranial mass. 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 Atrophy and advanced chronic small vessel ischemic changes of the white matter. Hypodensity at the pons, series 2, image 11 and sagittal series 6 image 26. Ventricles are nonenlarged Vascular: No hyperdense vessels. Carotid and vertebral calcifications. Skull: Normal. Negative for fracture or focal lesion. Sinuses/Orbits: No acute finding. Other: None IMPRESSION: 1. Negative for acute intracranial hemorrhage or mass. 2. Hypodensity at the pons, though some of this may be related to artifact, the finding is rounded with some sparing of peripheral tissue and acute or subacute  pontine infarct cannot be excluded. MRI is recommended for further evaluation. 3. Atrophy and chronic small vessel ischemic changes of the white matter Electronically Signed   By: Jasmine Pang M.D.   On: 11/15/2023 18:51   DG Ankle Complete Left Result Date: 11/15/2023 CLINICAL DATA:  Fall with ankle pain EXAM: LEFT ANKLE COMPLETE - 3+ VIEW COMPARISON:  01/30/2012 FINDINGS: Lateral plate and multiple fixating screws with additional medial malleolar fixating screws. Chronic postsurgical, posttraumatic and post infectious changes involving the distal tibia, distal fibula and tibiotalar joint. Fused appearance across the tibiotalar joint and probable subtalar fusion as well. No definite acute fracture is seen. IMPRESSION: Chronic postsurgical, posttraumatic and post infectious changes involving the distal tibia, distal fibula and tibiotalar joint. No definite acute osseous abnormality. Electronically Signed   By: Jasmine Pang M.D.   On: 11/15/2023 18:09   DG Foot Complete Left Result Date: 11/15/2023 CLINICAL DATA:  Larey Seat 2 weeks ago with left foot pain. EXAM: LEFT FOOT - COMPLETE 3+ VIEW COMPARISON:  Left ankle 01/30/2012 FINDINGS: Diffuse bone demineralization. Degenerative changes in the interphalangeal joints, metatarsal-phalangeal, tarsometatarsal, and interphalangeal joints. Postoperative fusion of the ankle joint with plate and screw fixation. No evidence of acute fracture or dislocation. Soft tissues are unremarkable. IMPRESSION: 1. No acute bony abnormalities. 2. Degenerative changes in the left foot. 3. Postoperative changes with surgical fusion of the left ankle including plate and screw fixation Electronically Signed   By: Burman Nieves M.D.   On: 11/15/2023 18:03   DG Hip Unilat W or Wo Pelvis 2-3 Views Left Result Date: 11/15/2023 CLINICAL DATA:  Fall 2 weeks ago. Dizziness with fall forward. Left foot pain. EXAM: DG HIP (WITH OR WITHOUT PELVIS) 2-3V LEFT COMPARISON:  None Available. FINDINGS:  Transverse subcapital fracture of the left femoral neck with varus angulation of the fracture fragments. No inter trochanteric involvement is identified. No dislocation at the hip joint. Pelvis appears intact. SI joints and symphysis pubis are not displaced. No destructive or expansile bone lesions. Degenerative changes in the lower lumbar spine and both hips. Vascular calcifications. IMPRESSION: Acute transverse fracture of the left femoral neck with varus angulation of the fracture fragments. Degenerative changes. Electronically Signed   By: Burman Nieves M.D.   On: 11/15/2023 18:02     PHYSICAL EXAM  Temp:  [98.3 F (36.8 C)-99.9 F (37.7  C)] 99.4 F (37.4 C) (02/28 1615) Pulse Rate:  [83-109] 96 (02/28 1615) Resp:  [16-18] 17 (02/28 1615) BP: (115-134)/(64-84) 134/84 (02/28 1615) SpO2:  [98 %-100 %] 98 % (02/28 1615) Weight:  [60.5 kg] 60.5 kg (02/28 0500)  General - well nourished, well developed, in no apparent distress.     Cardiovascular - regular rhythm and rate   Neuro - Pt eyes open on voice, able to move lips seems to say "good afternoon" after I said good afternoon to him, not able to answer other questions but mumbles. Not actively tracking but seems able to move eyes to sound on both sides. Eyes midline, pupils equal size. Mild right facial droop. Tongue protrusion not cooperative. RUE flaccid, LUE and LLE strong withdraw to pain and against gravity with localization to pain. RLE mild withdraw to pain. Sensation, coordination not cooperative and gait not tested.   ASSESSMENT/PLAN William Todd is a 73 y.o. male with PMH of DM, HTN, HLD, prostate cancer, left foot fracture with prosthesis, dementia and recent fall with acute transverse fracture of the left femoral neck s/p hemiarthroplasty on 2/2. Post op delirium/confusion but MRI at that time no acute finding, no stroke or hemorrhage. No sign of CAA. He was put on ASA 81 bid for 4 weeks for DVT prophylaxis. He was  discharged to SNF on 11/20/23. Readmitted on 2/15 with fall at SNF and left eyebrow laceration. CT head showed left frontal small ICH with bilateral small SAH, concerning for traumatic ICH with contusion. However repeat CT 2/16 showed significant enlarged hematoma on the left frontal area with multi-lobe configuration and IVH and SAH but no hydro with 5mm MLS.     ICH:  left frontal large ICH, etiology unclear, traumatic ICH vs. Spontaneous ICH. Less likely for hemorrhagic infarct CT head 2/16 left frontal small ICH with bilateral small SAH, concerning for traumatic ICH with contusion.  Repeat CT 2/16 showed significant enlarged hematoma on the left frontal area with multi-lobe configuration and IVH and SAH but no hydro with 5mm MLS.    Repeat CT 2/17 Similar appearance of intraparenchymal hematoma in the left frontal lobe with associated mass effect and 5 mm rightward midline shift. CTA head and neck - no spot sign. 60% R ICA bulb, b/l distal VA mod to severe stenosis,  mod left VA origin stenosis and mild to mod b/l ICA siphon CT repeat 2/18 large left frontal ICH 108cc vs. 2/17 105cc but with MLS of 7-8 mm.  MRI with and without unchanged size of large left frontal ICH with 4 mm MLS. Blood layering within both lateral ventricle. Subarachnoid blood over both hemisphere.  CT repeat 2/20 stable hematoma and MLS 2D Echo  EF 60-65% EEG left frontal sharp waves,  moderate diffuse encephalopathy, no seizure LDL 57 HgbA1c 6.1 SCDs for VTE prophylaxis aspirin 81 mg daily bid prior to admission, now on No antithrombotic.  Keppra 500 -> 750 mg bid- >off On amantadine for arousal Ongoing aggressive stroke risk factor management Therapy recommendations:  SNF Disposition:  pending  Cerebral edema Repeat CT 2/17 5 mm rightward midline shift. CT repeat 2/18 MLS of 7-8 mm.  CT repeat 2/20 stable hematoma and midline shift MRI Brain 2/22, MLS of 4 mm Allow sodium trending down gradually Na  153--149--148--146--143  Hypertension BP stable On Norvasc 5, metoprolol 25 BP goal less than 160 Long-term BP goal normotensive  Hyperlipidemia Home meds:  lipitor 40  LDL 57, goal < 70 resume lipitor 40  Continue statin on discharge  AKI Creatinine 1.2--2.15--1.68--1.49--1.37--1.46--1.43--1.40 On tube feeding and free water BMP monitoring  Dysphagia Did not pass swallow Currently n.p.o. cortrak placed On tube feeding and free water 200 Q4  Other Stroke Risk Factors Advanced age  Other Active Problems Recent fracture - fall with acute transverse fracture of the left femoral neck with varus angulation of the fracture fragments. Underwent hemiarthroplasty on 2/2.  Anemia, hemoglobin 8.1--7.9--8.3--7.8 Leukocytosis WBC 8.8--10.8--11.4  Hospital day # 12  Neurology will sign off. Please call with questions. Pt will follow up with stroke clinic NP at [facility] in about 4 weeks. Thanks for the consult.   Marvel Plan, MD PhD Stroke Neurology 12/12/2023 6:06 PM     To contact Stroke Continuity provider, please refer to [email or web address]. After hours, contact General Neurology

## 2023-12-12 NOTE — Plan of Care (Signed)
  Problem: Education: Goal: Ability to describe self-care measures that may prevent or decrease complications (Diabetes Survival Skills Education) will improve Outcome: Not Progressing Goal: Individualized Educational Video(s) Outcome: Not Progressing   Problem: Coping: Goal: Ability to adjust to condition or change in health will improve Outcome: Not Progressing   Problem: Fluid Volume: Goal: Ability to maintain a balanced intake and output will improve Outcome: Not Progressing   Problem: Health Behavior/Discharge Planning: Goal: Ability to identify and utilize available resources and services will improve Outcome: Not Progressing Goal: Ability to manage health-related needs will improve Outcome: Not Progressing   Problem: Metabolic: Goal: Ability to maintain appropriate glucose levels will improve Outcome: Not Progressing   Problem: Nutritional: Goal: Maintenance of adequate nutrition will improve Outcome: Not Progressing Goal: Progress toward achieving an optimal weight will improve Outcome: Not Progressing   Problem: Skin Integrity: Goal: Risk for impaired skin integrity will decrease Outcome: Not Progressing   Problem: Tissue Perfusion: Goal: Adequacy of tissue perfusion will improve Outcome: Not Progressing   Problem: Education: Goal: Knowledge of General Education information will improve Description: Including pain rating scale, medication(s)/side effects and non-pharmacologic comfort measures Outcome: Not Progressing   Problem: Health Behavior/Discharge Planning: Goal: Ability to manage health-related needs will improve Outcome: Not Progressing   Problem: Clinical Measurements: Goal: Ability to maintain clinical measurements within normal limits will improve Outcome: Not Progressing Goal: Will remain free from infection Outcome: Not Progressing Goal: Diagnostic test results will improve Outcome: Not Progressing Goal: Respiratory complications will  improve Outcome: Not Progressing Goal: Cardiovascular complication will be avoided Outcome: Not Progressing   Problem: Activity: Goal: Risk for activity intolerance will decrease Outcome: Not Progressing   Problem: Nutrition: Goal: Adequate nutrition will be maintained Outcome: Not Progressing   Problem: Coping: Goal: Level of anxiety will decrease Outcome: Not Progressing   Problem: Elimination: Goal: Will not experience complications related to bowel motility Outcome: Not Progressing Goal: Will not experience complications related to urinary retention Outcome: Not Progressing   Problem: Pain Managment: Goal: General experience of comfort will improve and/or be controlled Outcome: Not Progressing   Problem: Safety: Goal: Ability to remain free from injury will improve Outcome: Not Progressing   Problem: Skin Integrity: Goal: Risk for impaired skin integrity will decrease Outcome: Not Progressing   Problem: Education: Goal: Knowledge of disease or condition will improve Outcome: Not Progressing Goal: Knowledge of secondary prevention will improve (MUST DOCUMENT ALL) Outcome: Not Progressing Goal: Knowledge of patient specific risk factors will improve (DELETE if not current risk factor) Outcome: Not Progressing   Problem: Intracerebral Hemorrhage Tissue Perfusion: Goal: Complications of Intracerebral Hemorrhage will be minimized Outcome: Not Progressing   Problem: Coping: Goal: Will verbalize positive feelings about self Outcome: Not Progressing Goal: Will identify appropriate support needs Outcome: Not Progressing   Problem: Health Behavior/Discharge Planning: Goal: Ability to manage health-related needs will improve Outcome: Not Progressing Goal: Goals will be collaboratively established with patient/family Outcome: Not Progressing   Problem: Self-Care: Goal: Ability to participate in self-care as condition permits will improve Outcome: Not  Progressing Goal: Verbalization of feelings and concerns over difficulty with self-care will improve Outcome: Not Progressing Goal: Ability to communicate needs accurately will improve Outcome: Not Progressing   Problem: Nutrition: Goal: Risk of aspiration will decrease Outcome: Not Progressing Goal: Dietary intake will improve Outcome: Not Progressing   Problem: Safety: Goal: Non-violent Restraint(s) Outcome: Not Progressing

## 2023-12-12 NOTE — Progress Notes (Signed)
 PROGRESS NOTE    William Todd  ZOX:[id number] DOB: May 06, 1951 DOA: 11/29/2023 PCP: Knox Royalty, MD   Brief Narrative:  This 73 year old Male with past medical history significant for hypertension, recurrents mechanical fall, protein caloric malnutrition, diabetes type 2, chronic kidney disease, prostate cancer 2014 who presents via EMS from [facility] after suffering ground-level fall at [facility].  Workup on admission revealed acute intraparenchymal hemorrhage of the left frontal lobe with multiple bilateral small volume SAH.  Neurosurgery was consulted with no surgical intervention recommended.  While boarding in the ED patient had repeat CT which showed ICH revealed progressive left frontal hematoma with intraventricular extension into the fourth ventricle resulting in anterior midline shift of 5 mm, no hydrocephalus.  Neurosurgery reviewed imaging and recommended admission to the ICU under CCM for close monitoring.   2/15 presented after ground-level fall from SNF workup revealed ICH with bilateral [facility] 2/16 repeat CT while boarding in ED revealed progressive left frontal hematoma with intraventricular extension into the fourth ventricle resulting in anterior midline shift of 5 mm, no hydrocephalus.  Admission changed to ICU 2/18-CT angio, CT head with enlarging clot 2/20 - Stable left frontal hematoma with mild mass effect. 2/26: At this time bleeding has been stable for over a week.  Neurosurgery signed off.    Assessment & Plan:   Principal Problem:   Subarachnoid hemorrhage (HCC) Active Problems:   Intraparenchymal hemorrhage of brain (HCC)   Acute metabolic encephalopathy   History of closed left femoral fracture s/p arthroplasty (HCC)   Long-term memory impairment   Acute kidney injury superimposed on chronic kidney disease (HCC)   Essential hypertension   History of prostate cancer   Non-insulin dependent type 2 diabetes mellitus (HCC)   Normocytic anemia   History of mood  disorder   HLD (hyperlipidemia)   ICH (intracerebral hemorrhage) (HCC)   Protein-calorie malnutrition, severe  Left frontal intracerebral traumatic hematoma: Patient presented with B/L SAH sec.to trauma. Status post 7 days of prophylactic Keppra. CT head 12/16 left frontal small ICH with bilateral small SAH.  Traumatic contusion Repeat CT 2/16 showed significant and large hematoma of the left frontal area with multilobe configuration and IVH and SAH, 5 mm midline shift. CTA head and neck: 60% right ICA involved bilateral VA moderate to severe stenosis, moderate left VA origin stenosis and mild to moderate bilateral ICA; MRI with and without contrast unchanged size of large left frontal intraparenchymal hematoma with 4 mm anterior rightward midline shift.  Subarachnoid blood over both hemisphere. EEG showed left frontal sharp waves, moderate diffuse encephalopathy, no seizure. Patient was on aspirin prior to admission,  now off, No antithrombotic due to ICH. At this time bleeding has been stable for over a week.  Neurosurgery signed off. Still not been able to swallow.  Speech and swallow evaluation. Plan is for PEG tube if failed swallow evaluation.  Family is agreeable.   Cerebral edema: Repeat CT 2/17, 5 mm rightward  midline shift. CT 2/18: MLS  7/65mm MRI: 2/22:MLS 4mm Sodium trending down, continue to follow.   AKI CKD stage IIIb: Patient creatinine peaked to 1.7 Trending down  1.43 today .  Continue to monitor   Hypernatremia: He received hypertonic Saline.  Currently hypertonic off. Continue to monitor sodium , Improving. On free water  Fever: UA and chest x-ray unremarkable. He remains fever free for last few days.   Hypertension: Goal is normotensive. Continue Norvasc and metoprolol.   Diabetes type 2: Sliding scale insulin, 5 units tube  feeding coverage every 4 hours   History of frailty with recurrent mechanical falls: PT/OT > SNF   Constipation: Continue  MiraLAX and Senokot   Nutrition:, Severe protein caloric malnutrition On tube feedings.     See wound care documentation below     Pressure Injury 11/30/23 Hip Left Stage 2 -  Partial thickness loss of dermis presenting as a shallow open injury with a red, pink wound bed without slough. (Active)  11/30/23 2000  Location: Hip  Location Orientation: Left  Staging: Stage 2 -  Partial thickness loss of dermis presenting as a shallow open injury with a red, pink wound bed without slough.  Wound Description (Comments):   Present on Admission:   Dressing Type Foam - Lift dressing to assess site every shift 12/08/23 2000     Pressure Injury 12/01/23 Knee Left;Posterior Stage 3 -  Full thickness tissue loss. Subcutaneous fat may be visible but bone, tendon or muscle are NOT exposed. (Active)  12/01/23 2000  Location: Knee  Location Orientation: Left;Posterior  Staging: Stage 3 -  Full thickness tissue loss. Subcutaneous fat may be visible but bone, tendon or muscle are NOT exposed.  Wound Description (Comments):   Present on Admission:   Dressing Type Foam - Lift dressing to assess site every shift 12/08/23 2000        Nutrition Problem: Severe Malnutrition Etiology: chronic illness       Signs/Symptoms: severe fat depletion, severe muscle depletion, percent weight loss       Interventions: Tube feeding, MVI, Refer to RD note for recommendations   Estimated body mass index is 18.18 kg/m as calculated from the following:   Height as of this encounter: 6' (1.829 m).   Weight as of this encounter: 60.8 kg.   DVT prophylaxis: Heparin Code Status:Full code Family Communication: No family at bed side. Disposition Plan:    Status is: Inpatient Remains inpatient appropriate because: Severity of illness    Consultants:  Neurology Neurosurgery  Procedures:  MRI , EEG Antimicrobials:  Anti-infectives (From admission, onward)    None      Subjective: Patient was seen and  examined at bedside. Overnight events noted. Patient has core track tube for feeding.  Patient partially following commands. He was agitated and has partial mittens.  Speech / swallow pending.  Objective: Vitals:   12/12/23 0426 12/12/23 0500 12/12/23 0801 12/12/23 1056  BP: 115/64  121/71 119/69  Pulse: 96  99 83  Resp: 17  17 18   Temp: 99.3 F (37.4 C)  98.3 F (36.8 C) 98.3 F (36.8 C)  TempSrc: Oral  Oral Oral  SpO2: 100%  99% 99%  Weight:  60.5 kg    Height:        Intake/Output Summary (Last 24 hours) at 12/12/2023 1109 Last data filed at 12/12/2023 4259 Gross per 24 hour  Intake 3069.8 ml  Output 900 ml  Net 2169.8 ml   Filed Weights   12/09/23 0400 12/11/23 0500 12/12/23 0500  Weight: 60.8 kg 60.3 kg 60.5 kg    Examination:  General exam: Appears comfortable, deconditioned, not in any acute distress. Respiratory system: CTA Bilaterally. Respiratory effort normal.  RR 14 Cardiovascular system: S1 & S2 heard, RRR. No JVD, murmurs, rubs, gallops or clicks.  Gastrointestinal system: Abdomen is nondistended, soft and nontender.  Normal bowel sounds heard. Central nervous system: Alert and oriented X 1. No focal neurological deficits. Extremities: No edema, no cyanosis, no clubbing Skin: No rashes, lesions or ulcers Psychiatry:  Mood & affect appropriate.     Data Reviewed: I have personally reviewed following labs and imaging studies  CBC: Recent Labs  Lab 12/06/23 0415 12/09/23 0451 12/10/23 0627 12/11/23 0657  WBC 9.4 8.0 8.8 10.8*  HGB 8.4* 8.1* 7.9* 8.3*  HCT 27.0* 25.5* 25.0* 26.5*  MCV 95.1 95.1 95.4 96.4  PLT 137* 131* 131* 142*   Basic Metabolic Panel: Recent Labs  Lab 12/07/23 0848 12/07/23 1308 12/08/23 0525 12/09/23 0451 12/10/23 0627 12/11/23 0657  NA 155* 155* 153* 149* 148* 146*  K 3.6  --  4.1 4.0 4.2 4.3  CL 118*  --  118* 117* 113* 109  CO2 27  --  27 28 28 27   GLUCOSE 163*  --  202* 174* 171* 154*  BUN 34*  --  41* 34* 34*  36*  CREATININE 1.40*  --  1.79* 1.45* 1.46* 1.43*  CALCIUM 9.0  --  8.6* 8.3* 8.7* 8.8*  MG  --   --   --  2.4 2.3 2.4  PHOS  --   --   --  3.7 2.9 3.5   GFR: Estimated Creatinine Clearance: 39.4 mL/min (A) (by C-G formula based on SCr of 1.43 mg/dL (H)). Liver Function Tests: No results for input(s): "AST", "ALT", "ALKPHOS", "BILITOT", "PROT", "ALBUMIN" in the last 168 hours. No results for input(s): "LIPASE", "AMYLASE" in the last 168 hours. No results for input(s): "AMMONIA" in the last 168 hours. Coagulation Profile: No results for input(s): "INR", "PROTIME" in the last 168 hours. Cardiac Enzymes: No results for input(s): "CKTOTAL", "CKMB", "CKMBINDEX", "TROPONINI" in the last 168 hours. BNP (last 3 results) No results for input(s): "PROBNP" in the last 8760 hours. HbA1C: No results for input(s): "HGBA1C" in the last 72 hours. CBG: Recent Labs  Lab 12/11/23 2001 12/12/23 0002 12/12/23 0430 12/12/23 0801 12/12/23 1057  GLUCAP 156* 182* 151* 187* 160*   Lipid Profile: No results for input(s): "CHOL", "HDL", "LDLCALC", "TRIG", "CHOLHDL", "LDLDIRECT" in the last 72 hours. Thyroid Function Tests: No results for input(s): "TSH", "T4TOTAL", "FREET4", "T3FREE", "THYROIDAB" in the last 72 hours. Anemia Panel: No results for input(s): "VITAMINB12", "FOLATE", "FERRITIN", "TIBC", "IRON", "RETICCTPCT" in the last 72 hours. Sepsis Labs: Recent Labs  Lab 12/08/23 0959  PROCALCITON 0.15    No results found for this or any previous visit (from the past 240 hours).   Radiology Studies: CT HEAD WO CONTRAST ( ) Result Date: 12/12/2023 CLINICAL DATA:  Sudden hiccups in the setting of ICH, evaluate for worsening bleed EXAM: CT HEAD WITHOUT CONTRAST TECHNIQUE: Contiguous axial images were obtained from the base of the skull through the vertex without intravenous contrast. RADIATION DOSE REDUCTION: This exam was performed according to the departmental dose-optimization program which  includes automated exposure control, adjustment of the mA and/or kV according to patient size and/or use of iterative reconstruction technique. COMPARISON:  12/04/2023 FINDINGS: Brain: The patient's large left frontal hematoma has hazy margination, expected evolution. Regional edema is mildly increased, also expected. The hemorrhagic area and lobulated shape is unchanged, hemorrhagic area measuring up to 7.7 cm anterior to posterior and 4.3 cm craniocaudal. Similar degree of local mass effect and anterior midline shift measuring 7 mm at the anterior septum pellucidum. Layering blood clot in the lateral ventricles. Subarachnoid hemorrhage scattered along the convexities without interval increase. 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 No hydrocephalus or evidence of acute infarct. Vascular: No hyperdense vessel or unexpected calcification. Skull: Normal. Negative for fracture or focal lesion. Sinuses/Orbits: No  acute finding. IMPRESSION: Expected evolution of large left frontal hematoma with intraventricular and subarachnoid extension. Anterior midline shift is similar to prior at 7 mm. No new abnormality. Electronically Signed   By: Tiburcio Pea M.D.   On: 12/12/2023 05:14    Scheduled Meds:  amantadine  100 mg Per Tube BID   amLODipine  5 mg Per Tube Daily   atorvastatin  40 mg Per Tube Daily   feeding supplement (PROSource TF20)  60 mL Per Tube Daily   free water  200 mL Per Tube Q4H   heparin injection (subcutaneous)  5,000 Units Subcutaneous Q12H   insulin aspart  0-15 Units Subcutaneous Q4H   insulin aspart  5 Units Subcutaneous Q4H   metoCLOPramide  10 mg Per Tube TID AC & HS   metoprolol tartrate  50 mg Per Tube BID   multivitamin with minerals  1 tablet Per Tube Daily   mouth rinse  15 mL Mouth Rinse 4 times per day   pantoprazole (PROTONIX) IV  40 mg Intravenous Q12H   polyethylene glycol  17 g Per Tube Daily   senna  1 tablet Per Tube BID   sodium chloride  flush  3 mL Intravenous Q12H   Continuous Infusions:  chlorproMAZINE (THORAZINE) 12.5 mg in sodium chloride 0.9 % 25 mL IVPB 50 mL/hr at 12/12/23 0404   feeding supplement (OSMOLITE 1.5 CAL) 50 mL/hr at 12/12/23 0404     LOS: 12 days    Time spent: 35 mins    Willeen Niece, MD [facility]   If 7PM-7AM, please contact night-coverage

## 2023-12-12 NOTE — Progress Notes (Signed)
 Hiccups noted and reported to Dr. Dolly Rias.

## 2023-12-13 DIAGNOSIS — I609 Nontraumatic subarachnoid hemorrhage, unspecified: Secondary | ICD-10-CM | POA: Diagnosis not present

## 2023-12-13 LAB — BASIC METABOLIC PANEL
Anion gap: 11 (ref 5–15)
BUN: 36 mg/dL — ABNORMAL HIGH (ref 8–23)
CO2: 25 mmol/L (ref 22–32)
Calcium: 8.2 mg/dL — ABNORMAL LOW (ref 8.9–10.3)
Chloride: 104 mmol/L (ref 98–111)
Creatinine, Ser: 1.41 mg/dL — ABNORMAL HIGH (ref 0.61–1.24)
GFR, Estimated: 53 mL/min — ABNORMAL LOW (ref >60)
Glucose, Bld: 158 mg/dL — ABNORMAL HIGH (ref 70–99)
Potassium: 4.2 mmol/L (ref 3.5–5.1)
Sodium: 140 mmol/L (ref 135–145)

## 2023-12-13 LAB — CBC
HCT: 25.3 % — ABNORMAL LOW (ref 39.0–52.0)
Hemoglobin: 8 g/dL — ABNORMAL LOW (ref 13.0–17.0)
MCH: 29.9 pg (ref 26.0–34.0)
MCHC: 31.6 g/dL (ref 30.0–36.0)
MCV: 94.4 fL (ref 80.0–100.0)
Platelets: 167 10*3/uL (ref 150–400)
RBC: 2.68 MIL/uL — ABNORMAL LOW (ref 4.22–5.81)
RDW: 16.1 % — ABNORMAL HIGH (ref 11.5–15.5)
WBC: 11.6 10*3/uL — ABNORMAL HIGH (ref 4.0–10.5)
nRBC: 0.2 % (ref 0.0–0.2)

## 2023-12-13 LAB — MAGNESIUM: Magnesium: 2.3 mg/dL (ref 1.7–2.4)

## 2023-12-13 LAB — GLUCOSE, CAPILLARY
Glucose-Capillary: 208 mg/dL — ABNORMAL HIGH (ref 70–99)
Glucose-Capillary: 163 mg/dL — ABNORMAL HIGH (ref 70–99)
Glucose-Capillary: 133 mg/dL — ABNORMAL HIGH (ref 70–99)
Glucose-Capillary: 216 mg/dL — ABNORMAL HIGH (ref 70–99)
Glucose-Capillary: 163 mg/dL — ABNORMAL HIGH (ref 70–99)
Glucose-Capillary: 224 mg/dL — ABNORMAL HIGH (ref 70–99)

## 2023-12-13 LAB — PHOSPHORUS: Phosphorus: 3 mg/dL (ref 2.5–4.6)

## 2023-12-13 MED ORDER — ACETAMINOPHEN 325 MG PO TABS
650.0000 mg | ORAL_TABLET | Freq: Four times a day (QID) | ORAL | Status: DC | PRN
  Administered 2023-12-13 (×3): 650 mg via ORAL
  Filled 2023-12-13 (×3): qty 2

## 2023-12-13 NOTE — Plan of Care (Signed)
  Problem: Education: Goal: Ability to describe self-care measures that may prevent or decrease complications (Diabetes Survival Skills Education) will improve Outcome: Progressing Goal: Individualized Educational Video(s) Outcome: Progressing   Problem: Coping: Goal: Ability to adjust to condition or change in health will improve Outcome: Progressing   Problem: Fluid Volume: Goal: Ability to maintain a balanced intake and output will improve Outcome: Progressing   Problem: Health Behavior/Discharge Planning: Goal: Ability to identify and utilize available resources and services will improve Outcome: Progressing Goal: Ability to manage health-related needs will improve Outcome: Progressing   Problem: Metabolic: Goal: Ability to maintain appropriate glucose levels will improve Outcome: Progressing   Problem: Nutritional: Goal: Maintenance of adequate nutrition will improve Outcome: Progressing Goal: Progress toward achieving an optimal weight will improve Outcome: Progressing   Problem: Skin Integrity: Goal: Risk for impaired skin integrity will decrease Outcome: Progressing   Problem: Tissue Perfusion: Goal: Adequacy of tissue perfusion will improve Outcome: Progressing   Problem: Education: Goal: Knowledge of General Education information will improve Description: Including pain rating scale, medication(s)/side effects and non-pharmacologic comfort measures Outcome: Progressing   Problem: Health Behavior/Discharge Planning: Goal: Ability to manage health-related needs will improve Outcome: Progressing   Problem: Clinical Measurements: Goal: Ability to maintain clinical measurements within normal limits will improve Outcome: Progressing Goal: Will remain free from infection Outcome: Progressing Goal: Diagnostic test results will improve Outcome: Progressing Goal: Respiratory complications will improve Outcome: Progressing Goal: Cardiovascular complication will  be avoided Outcome: Progressing   Problem: Activity: Goal: Risk for activity intolerance will decrease Outcome: Progressing   Problem: Nutrition: Goal: Adequate nutrition will be maintained Outcome: Progressing   Problem: Coping: Goal: Level of anxiety will decrease Outcome: Progressing   Problem: Elimination: Goal: Will not experience complications related to bowel motility Outcome: Progressing Goal: Will not experience complications related to urinary retention Outcome: Progressing   Problem: Pain Managment: Goal: General experience of comfort will improve and/or be controlled Outcome: Progressing   Problem: Safety: Goal: Ability to remain free from injury will improve Outcome: Progressing   Problem: Skin Integrity: Goal: Risk for impaired skin integrity will decrease Outcome: Progressing   Problem: Education: Goal: Knowledge of disease or condition will improve Outcome: Progressing Goal: Knowledge of secondary prevention will improve (MUST DOCUMENT ALL) Outcome: Progressing Goal: Knowledge of patient specific risk factors will improve (DELETE if not current risk factor) Outcome: Progressing   Problem: Intracerebral Hemorrhage Tissue Perfusion: Goal: Complications of Intracerebral Hemorrhage will be minimized Outcome: Progressing   Problem: Coping: Goal: Will verbalize positive feelings about self Outcome: Progressing Goal: Will identify appropriate support needs Outcome: Progressing   Problem: Health Behavior/Discharge Planning: Goal: Ability to manage health-related needs will improve Outcome: Progressing Goal: Goals will be collaboratively established with patient/family Outcome: Progressing   Problem: Self-Care: Goal: Ability to participate in self-care as condition permits will improve Outcome: Progressing Goal: Verbalization of feelings and concerns over difficulty with self-care will improve Outcome: Progressing Goal: Ability to communicate needs  accurately will improve Outcome: Progressing   Problem: Nutrition: Goal: Risk of aspiration will decrease Outcome: Progressing Goal: Dietary intake will improve Outcome: Progressing   Problem: Safety: Goal: Non-violent Restraint(s) Outcome: Progressing

## 2023-12-13 NOTE — Progress Notes (Signed)
 PROGRESS NOTE    DEANDRA GADSON  ZOX:[id number] DOB: 03/30/1951 DOA: 11/29/2023 PCP: Knox Royalty, MD   Brief Narrative:  This 73 year old Male with past medical history significant for hypertension, recurrents mechanical fall, protein caloric malnutrition, diabetes type 2, chronic kidney disease, prostate cancer 2014 who presents via EMS from [facility] after suffering ground-level fall at [facility].  Workup on admission revealed acute intraparenchymal hemorrhage of the left frontal lobe with multiple bilateral small volume SAH.  Neurosurgery was consulted with no surgical intervention recommended.  While boarding in the ED patient had repeat CT which showed ICH revealed progressive left frontal hematoma with intraventricular extension into the fourth ventricle resulting in anterior midline shift of 5 mm, no hydrocephalus.  Neurosurgery reviewed imaging and recommended admission to the ICU under CCM for close monitoring.   2/15 presented after ground-level fall from SNF workup revealed ICH with bilateral [facility] 2/16 repeat CT while boarding in ED revealed progressive left frontal hematoma with intraventricular extension into the fourth ventricle resulting in anterior midline shift of 5 mm, no hydrocephalus.  Admission changed to ICU 2/18-CT angio, CT head with enlarging clot 2/20 - Stable left frontal hematoma with mild mass effect. 2/26: At this time bleeding has been stable for over a week.  Neurosurgery signed off.   Assessment & Plan:   Principal Problem:   Subarachnoid hemorrhage (HCC) Active Problems:   Intraparenchymal hemorrhage of brain (HCC)   Acute metabolic encephalopathy   History of closed left femoral fracture s/p arthroplasty (HCC)   Long-term memory impairment   Acute kidney injury superimposed on chronic kidney disease (HCC)   Essential hypertension   History of prostate cancer   Non-insulin dependent type 2 diabetes mellitus (HCC)   Normocytic anemia   History of mood  disorder   HLD (hyperlipidemia)   ICH (intracerebral hemorrhage) (HCC)   Protein-calorie malnutrition, severe  Left frontal intracerebral traumatic hematoma: Patient presented with B/L SAH sec.to trauma. Status post 7 days of prophylactic Keppra. CT head 12/16 left frontal small ICH with bilateral small SAH.  Traumatic contusion Repeat CT 2/16 showed significant and large hematoma of the left frontal area with multilobe configuration and IVH and SAH, 5 mm midline shift. CTA head and neck: 60% right ICA involved bilateral VA moderate to severe stenosis, moderate left VA origin stenosis and mild to moderate bilateral ICA; MRI with and without contrast unchanged size of large left frontal intraparenchymal hematoma with 4 mm anterior rightward midline shift.  Subarachnoid blood over both hemisphere. EEG showed left frontal sharp waves, moderate diffuse encephalopathy, no seizure. Patient was on aspirin prior to admission,  now off, No antithrombotic due to ICH. At this time bleeding has been stable for over a week.  Neurosurgery signed off. Still not been able to swallow.  Speech and swallow evaluation. Plan is for PEG tube if failed swallow evaluation.  Family is agreeable.   Cerebral edema: Repeat CT 2/17, 5 mm rightward  midline shift. CT 2/18: MLS  7/58mm MRI: 2/22:MLS 4mm Sodium trending down, continue to follow.   AKI CKD stage IIIb: Patient creatinine peaked to 1.7 Trending down  1.41 today .  Continue to monitor   Hypernatremia: He received hypertonic Saline.  Currently hypertonic off. Continue to monitor sodium , Improved. On free water  Fever: UA and chest x-ray unremarkable. He remains fever free for last few days. Patient has fever early morning today.   Hypertension: Goal is normotensive. Continue Norvasc and metoprolol.   Diabetes type 2: Sliding  scale insulin, 5 units tube feeding coverage every 4 hours   History of frailty with recurrent mechanical falls: PT/OT  > SNF   Constipation: Continue MiraLAX and Senokot   Nutrition:, Severe protein caloric malnutrition On tube feedings.     See wound care documentation below     Pressure Injury 11/30/23 Hip Left Stage 2 -  Partial thickness loss of dermis presenting as a shallow open injury with a red, pink wound bed without slough. (Active)  11/30/23 2000  Location: Hip  Location Orientation: Left  Staging: Stage 2 -  Partial thickness loss of dermis presenting as a shallow open injury with a red, pink wound bed without slough.  Wound Description (Comments):   Present on Admission:   Dressing Type Foam - Lift dressing to assess site every shift 12/08/23 2000     Pressure Injury 12/01/23 Knee Left;Posterior Stage 3 -  Full thickness tissue loss. Subcutaneous fat may be visible but bone, tendon or muscle are NOT exposed. (Active)  12/01/23 2000  Location: Knee  Location Orientation: Left;Posterior  Staging: Stage 3 -  Full thickness tissue loss. Subcutaneous fat may be visible but bone, tendon or muscle are NOT exposed.  Wound Description (Comments):   Present on Admission:   Dressing Type Foam - Lift dressing to assess site every shift 12/08/23 2000        Nutrition Problem: Severe Malnutrition Etiology: chronic illness       Signs/Symptoms: severe fat depletion, severe muscle depletion, percent weight loss       Interventions: Tube feeding, MVI, Refer to RD note for recommendations   Estimated body mass index is 18.18 kg/m as calculated from the following:   Height as of this encounter: 6' (1.829 m).   Weight as of this encounter: 60.8 kg.   DVT prophylaxis: Heparin Code Status:Full code Family Communication: No family at bed side. Disposition Plan:    Status is: Inpatient Remains inpatient appropriate because: Severity of illness    Consultants:  Neurology Neurosurgery  Procedures:  MRI , EEG Antimicrobials:  Anti-infectives (From admission, onward)    None       Subjective: Patient was seen and examined at bedside. Overnight events noted. Patient has core track tube for feeding. He was agitated and has partial mittens.  Patient was very lethargic in the morning, later in the day started following partial commands  Objective: Vitals:   12/13/23 0400 12/13/23 0456 12/13/23 0748 12/13/23 1113  BP:   137/72 98/60  Pulse:   91 96  Resp:   19 17  Temp: 99.3 F (37.4 C)  (!) 100.6 F (38.1 C) 99.9 F (37.7 C)  TempSrc: Oral  Oral Oral  SpO2:   100% 100%  Weight:  60.7 kg    Height:        Intake/Output Summary (Last 24 hours) at 12/13/2023 1332 Last data filed at 12/13/2023 0800 Gross per 24 hour  Intake 1009 ml  Output 2500 ml  Net -1491 ml   Filed Weights   12/11/23 0500 12/12/23 0500 12/13/23 0456  Weight: 60.3 kg 60.5 kg 60.7 kg    Examination:  General exam: Appears comfortable, deconditioned, not in any acute distress. Respiratory system: CTA Bilaterally. Respiratory effort normal.  RR 13 Cardiovascular system: S1 & S2 heard, RRR. No JVD, murmurs, rubs, gallops or clicks.  Gastrointestinal system: Abdomen is non distended, soft and non tender.  Normal bowel sounds heard. Central nervous system: Alert and oriented X 1. No focal neurological deficits.  Extremities: No edema, no cyanosis, no clubbing Skin: No rashes, lesions or ulcers Psychiatry: Mood & affect appropriate.     Data Reviewed: I have personally reviewed following labs and imaging studies  CBC: Recent Labs  Lab 12/09/23 0451 12/10/23 0627 12/11/23 0657 12/12/23 1028 12/13/23 0655  WBC 8.0 8.8 10.8* 11.4* 11.6*  HGB 8.1* 7.9* 8.3* 7.8* 8.0*  HCT 25.5* 25.0* 26.5* 25.1* 25.3*  MCV 95.1 95.4 96.4 96.2 94.4  PLT 131* 131* 142* 163 167   Basic Metabolic Panel: Recent Labs  Lab 12/09/23 0451 12/10/23 0627 12/11/23 0657 12/12/23 1028 12/13/23 0655  NA 149* 148* 146* 143 140  K 4.0 4.2 4.3 4.3 4.2  CL 117* 113* 109 107 104  CO2 28 28 27 26 25    GLUCOSE 174* 171* 154* 142* 158*  BUN 34* 34* 36* 41* 36*  CREATININE 1.45* 1.46* 1.43* 1.40* 1.41*  CALCIUM 8.3* 8.7* 8.8* 8.4* 8.2*  MG 2.4 2.3 2.4 2.5* 2.3  PHOS 3.7 2.9 3.5 3.6 3.0   GFR: Estimated Creatinine Clearance: 40.1 mL/min (A) (by C-G formula based on SCr of 1.41 mg/dL (H)). Liver Function Tests: No results for input(s): "AST", "ALT", "ALKPHOS", "BILITOT", "PROT", "ALBUMIN" in the last 168 hours. No results for input(s): "LIPASE", "AMYLASE" in the last 168 hours. No results for input(s): "AMMONIA" in the last 168 hours. Coagulation Profile: No results for input(s): "INR", "PROTIME" in the last 168 hours. Cardiac Enzymes: No results for input(s): "CKTOTAL", "CKMB", "CKMBINDEX", "TROPONINI" in the last 168 hours. BNP (last 3 results) No results for input(s): "PROBNP" in the last 8760 hours. HbA1C: No results for input(s): "HGBA1C" in the last 72 hours. CBG: Recent Labs  Lab 12/12/23 1923 12/12/23 2357 12/13/23 0350 12/13/23 0748 12/13/23 1114  GLUCAP 196* 261* 208* 163* 133*   Lipid Profile: No results for input(s): "CHOL", "HDL", "LDLCALC", "TRIG", "CHOLHDL", "LDLDIRECT" in the last 72 hours. Thyroid Function Tests: No results for input(s): "TSH", "T4TOTAL", "FREET4", "T3FREE", "THYROIDAB" in the last 72 hours. Anemia Panel: No results for input(s): "VITAMINB12", "FOLATE", "FERRITIN", "TIBC", "IRON", "RETICCTPCT" in the last 72 hours. Sepsis Labs: Recent Labs  Lab 12/08/23 0959  PROCALCITON 0.15    No results found for this or any previous visit (from the past 240 hours).   Radiology Studies: CT HEAD WO CONTRAST ( ) Result Date: 12/12/2023 CLINICAL DATA:  Sudden hiccups in the setting of ICH, evaluate for worsening bleed EXAM: CT HEAD WITHOUT CONTRAST TECHNIQUE: Contiguous axial images were obtained from the base of the skull through the vertex without intravenous contrast. RADIATION DOSE REDUCTION: This exam was performed according to the departmental  dose-optimization program which includes automated exposure control, adjustment of the mA and/or kV according to patient size and/or use of iterative reconstruction technique. COMPARISON:  12/04/2023 FINDINGS: Brain: The patient's large left frontal hematoma has hazy margination, expected evolution. Regional edema is mildly increased, also expected. The hemorrhagic area and lobulated shape is unchanged, hemorrhagic area measuring up to 7.7 cm anterior to posterior and 4.3 cm craniocaudal. Similar degree of local mass effect and anterior midline shift measuring 7 mm at the anterior septum pellucidum. Layering blood clot in the lateral ventricles. Subarachnoid hemorrhage scattered along the convexities without interval increase. 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 No hydrocephalus or evidence of acute infarct. Vascular: No hyperdense vessel or unexpected calcification. Skull: Normal. Negative for fracture or focal lesion. Sinuses/Orbits: No acute finding. IMPRESSION: Expected evolution of large left frontal hematoma with intraventricular and subarachnoid extension. Anterior midline  shift is similar to prior at 7 mm. No new abnormality. Electronically Signed   By: Tiburcio Pea M.D.   On: 12/12/2023 05:14    Scheduled Meds:  amantadine  100 mg Per Tube BID   amLODipine  5 mg Per Tube Daily   atorvastatin  40 mg Per Tube Daily   feeding supplement (PROSource TF20)  60 mL Per Tube Daily   free water  200 mL Per Tube Q4H   heparin injection (subcutaneous)  5,000 Units Subcutaneous Q12H   insulin aspart  0-15 Units Subcutaneous Q4H   insulin aspart  5 Units Subcutaneous Q4H   metoCLOPramide  10 mg Per Tube TID AC & HS   metoprolol tartrate  50 mg Per Tube BID   multivitamin with minerals  1 tablet Per Tube Daily   mouth rinse  15 mL Mouth Rinse 4 times per day   pantoprazole (PROTONIX) IV  40 mg Intravenous Q12H   polyethylene glycol  17 g Per Tube Daily   senna  1 tablet  Per Tube BID   sodium chloride flush  3 mL Intravenous Q12H   Continuous Infusions:  chlorproMAZINE (THORAZINE) 12.5 mg in sodium chloride 0.9 % 25 mL IVPB 12.5 mg (12/13/23 0824)   feeding supplement (OSMOLITE 1.5 CAL) 50 mL/hr at 12/13/23 0052     LOS: 13 days    Time spent: 35 mins    Willeen Niece, MD [facility]   If 7PM-7AM, please contact night-coverage

## 2023-12-13 NOTE — Plan of Care (Signed)
 Pt sleepy this shift. IV is patent and saline locked. Continent of bowel and bladder. Temperature elevated and provider notified. Q2 turns. Wife at bedside this shift. Will continue with current plan of care.  Problem: Education: Goal: Ability to describe self-care measures that may prevent or decrease complications (Diabetes Survival Skills Education) will improve Outcome: Progressing Goal: Individualized Educational Video(s) Outcome: Progressing   Problem: Coping: Goal: Ability to adjust to condition or change in health will improve Outcome: Progressing   Problem: Fluid Volume: Goal: Ability to maintain a balanced intake and output will improve Outcome: Progressing   Problem: Health Behavior/Discharge Planning: Goal: Ability to identify and utilize available resources and services will improve Outcome: Progressing Goal: Ability to manage health-related needs will improve Outcome: Progressing   Problem: Metabolic: Goal: Ability to maintain appropriate glucose levels will improve Outcome: Progressing   Problem: Nutritional: Goal: Maintenance of adequate nutrition will improve Outcome: Progressing Goal: Progress toward achieving an optimal weight will improve Outcome: Progressing   Problem: Skin Integrity: Goal: Risk for impaired skin integrity will decrease Outcome: Progressing   Problem: Tissue Perfusion: Goal: Adequacy of tissue perfusion will improve Outcome: Progressing   Problem: Education: Goal: Knowledge of General Education information will improve Description: Including pain rating scale, medication(s)/side effects and non-pharmacologic comfort measures Outcome: Progressing   Problem: Health Behavior/Discharge Planning: Goal: Ability to manage health-related needs will improve Outcome: Progressing   Problem: Clinical Measurements: Goal: Ability to maintain clinical measurements within normal limits will improve Outcome: Progressing Goal: Will remain free  from infection Outcome: Progressing Goal: Diagnostic test results will improve Outcome: Progressing Goal: Respiratory complications will improve Outcome: Progressing Goal: Cardiovascular complication will be avoided Outcome: Progressing   Problem: Activity: Goal: Risk for activity intolerance will decrease Outcome: Progressing   Problem: Nutrition: Goal: Adequate nutrition will be maintained Outcome: Progressing   Problem: Coping: Goal: Level of anxiety will decrease Outcome: Progressing   Problem: Elimination: Goal: Will not experience complications related to bowel motility Outcome: Progressing Goal: Will not experience complications related to urinary retention Outcome: Progressing   Problem: Pain Managment: Goal: General experience of comfort will improve and/or be controlled Outcome: Progressing   Problem: Safety: Goal: Ability to remain free from injury will improve Outcome: Progressing   Problem: Skin Integrity: Goal: Risk for impaired skin integrity will decrease Outcome: Progressing   Problem: Education: Goal: Knowledge of disease or condition will improve Outcome: Progressing Goal: Knowledge of secondary prevention will improve (MUST DOCUMENT ALL) Outcome: Progressing Goal: Knowledge of patient specific risk factors will improve (DELETE if not current risk factor) Outcome: Progressing   Problem: Intracerebral Hemorrhage Tissue Perfusion: Goal: Complications of Intracerebral Hemorrhage will be minimized Outcome: Progressing   Problem: Coping: Goal: Will verbalize positive feelings about self Outcome: Progressing Goal: Will identify appropriate support needs Outcome: Progressing   Problem: Health Behavior/Discharge Planning: Goal: Ability to manage health-related needs will improve Outcome: Progressing Goal: Goals will be collaboratively established with patient/family Outcome: Progressing   Problem: Self-Care: Goal: Ability to participate in  self-care as condition permits will improve Outcome: Progressing Goal: Verbalization of feelings and concerns over difficulty with self-care will improve Outcome: Progressing Goal: Ability to communicate needs accurately will improve Outcome: Progressing   Problem: Nutrition: Goal: Risk of aspiration will decrease Outcome: Progressing Goal: Dietary intake will improve Outcome: Progressing   Problem: Safety: Goal: Non-violent Restraint(s) Outcome: Progressing

## 2023-12-14 ENCOUNTER — Inpatient Hospital Stay (HOSPITAL_COMMUNITY)

## 2023-12-14 DIAGNOSIS — I609 Nontraumatic subarachnoid hemorrhage, unspecified: Secondary | ICD-10-CM | POA: Diagnosis not present

## 2023-12-14 LAB — CBC
HCT: 23.8 % — ABNORMAL LOW (ref 39.0–52.0)
Hemoglobin: 7.6 g/dL — ABNORMAL LOW (ref 13.0–17.0)
MCH: 30 pg (ref 26.0–34.0)
MCHC: 31.9 g/dL (ref 30.0–36.0)
MCV: 94.1 fL (ref 80.0–100.0)
Platelets: 153 10*3/uL (ref 150–400)
RBC: 2.53 MIL/uL — ABNORMAL LOW (ref 4.22–5.81)
RDW: 16.5 % — ABNORMAL HIGH (ref 11.5–15.5)
WBC: 12.7 10*3/uL — ABNORMAL HIGH (ref 4.0–10.5)
nRBC: 0 % (ref 0.0–0.2)

## 2023-12-14 LAB — GLUCOSE, CAPILLARY
Glucose-Capillary: 119 mg/dL — ABNORMAL HIGH (ref 70–99)
Glucose-Capillary: 184 mg/dL — ABNORMAL HIGH (ref 70–99)
Glucose-Capillary: 185 mg/dL — ABNORMAL HIGH (ref 70–99)
Glucose-Capillary: 219 mg/dL — ABNORMAL HIGH (ref 70–99)
Glucose-Capillary: 239 mg/dL — ABNORMAL HIGH (ref 70–99)
Glucose-Capillary: 281 mg/dL — ABNORMAL HIGH (ref 70–99)

## 2023-12-14 LAB — URINALYSIS, ROUTINE W REFLEX MICROSCOPIC
Bilirubin Urine: NEGATIVE
Glucose, UA: 50 mg/dL — AB
Hgb urine dipstick: NEGATIVE
Ketones, ur: NEGATIVE mg/dL
Nitrite: NEGATIVE
Protein, ur: NEGATIVE mg/dL
Specific Gravity, Urine: 1.017 (ref 1.005–1.030)
pH: 5 (ref 5.0–8.0)

## 2023-12-14 MED ORDER — SODIUM CHLORIDE 0.9 % IV SOLN
3.0000 g | Freq: Four times a day (QID) | INTRAVENOUS | Status: DC
  Administered 2023-12-14 – 2023-12-15 (×3): 3 g via INTRAVENOUS
  Filled 2023-12-14 (×3): qty 8

## 2023-12-14 MED ORDER — ACETAMINOPHEN NICU ORAL SYRINGE 160 MG/5 ML: 15.0000 mg/kg | Freq: Four times a day (QID) | ORAL | Status: DC | PRN

## 2023-12-14 MED ORDER — ACETAMINOPHEN 160 MG/5ML PO SOLN
650.0000 mg | Freq: Four times a day (QID) | ORAL | Status: DC | PRN
  Administered 2023-12-14 – 2023-12-29 (×7): 650 mg
  Filled 2023-12-14 (×6): qty 20.3

## 2023-12-14 NOTE — Progress Notes (Signed)
 PROGRESS NOTE    William Todd  YQI:[id number] DOB: [birth year] DOA: 11/29/2023 PCP: Knox Royalty, MD   Brief Narrative:  This 73 year old Male with past medical history significant for hypertension, recurrents mechanical fall, protein caloric malnutrition, diabetes type 2, chronic kidney disease, prostate cancer 2014 who presents via EMS from [facility] after suffering ground-level fall at [facility].  Workup on admission revealed acute intraparenchymal hemorrhage of the left frontal lobe with multiple bilateral small volume SAH.  Neurosurgery was consulted with no surgical intervention recommended.  While boarding in the ED patient had repeat CT which showed ICH revealed progressive left frontal hematoma with intraventricular extension into the fourth ventricle resulting in anterior midline shift of 5 mm, no hydrocephalus.  Neurosurgery reviewed imaging and recommended admission to the ICU under CCM for close monitoring.   2/15 presented after ground-level fall from SNF workup revealed ICH with bilateral [facility] 2/16 repeat CT while boarding in ED revealed progressive left frontal hematoma with intraventricular extension into the fourth ventricle resulting in anterior midline shift of 5 mm, no hydrocephalus.  Admission changed to ICU 2/18-CT angio, CT head with enlarging clot 2/20 - Stable left frontal hematoma with mild mass effect. 2/26: At this time bleeding has been stable for over a week.  Neurosurgery signed off.   Assessment & Plan:   Principal Problem:   Subarachnoid hemorrhage (HCC) Active Problems:   Intraparenchymal hemorrhage of brain (HCC)   Acute metabolic encephalopathy   History of closed left femoral fracture s/p arthroplasty (HCC)   Long-term memory impairment   Acute kidney injury superimposed on chronic kidney disease (HCC)   Essential hypertension   History of prostate cancer   Non-insulin dependent type 2 diabetes mellitus (HCC)   Normocytic anemia   History of mood  disorder   HLD (hyperlipidemia)   ICH (intracerebral hemorrhage) (HCC)   Protein-calorie malnutrition, severe  Left frontal intracerebral traumatic hematoma: Patient presented with B/L SAH sec.to trauma. Status post 7 days of prophylactic Keppra. CT head 12/16 left frontal small ICH with bilateral small SAH.  Traumatic contusion Repeat CT 2/16 showed significant and large hematoma of the left frontal area with multilobe configuration and IVH and SAH, 5 mm midline shift. CTA head and neck: 60% right ICA involved bilateral VA moderate to severe stenosis, moderate left VA origin stenosis and mild to moderate bilateral ICA; MRI with and without contrast unchanged size of large left frontal intraparenchymal hematoma with 4 mm anterior rightward midline shift.  Subarachnoid blood over both hemisphere. EEG showed left frontal sharp waves, moderate diffuse encephalopathy, no seizure. Patient was on aspirin prior to admission,  now off, No antithrombotic due to ICH. At this time bleeding has been stable for over a week.  Neurosurgery signed off. Still not been able to swallow.  Speech and swallow evaluation. Plan is for PEG tube if failed swallow evaluation.  Family is agreeable.   Cerebral edema: Repeat CT 2/17, 5 mm rightward  midline shift. CT 2/18: MLS  7/21mm MRI: 2/22:MLS 4mm Sodium trending down, continue to follow.   AKI CKD stage IIIb: Patient creatinine peaked to 1.7 Trending down  1.41 today .  Continue to monitor   Hypernatremia: He received hypertonic Saline.  Currently hypertonic off. Continue to monitor sodium , Improved. On free water  Fever: UA and chest x-ray unremarkable. Started spiking fevers again. Chest x-ray concerning for aspiration pneumonia,  started on Unasyn.  Hypertension: Goal is normotensive. Continue Norvasc and metoprolol.   Diabetes type 2: Sliding scale  insulin, 5 units tube feeding coverage every 4 hours   History of frailty with recurrent  mechanical falls: PT/OT > SNF   Constipation: Continue MiraLAX and Senokot   Nutrition:, Severe protein caloric malnutrition On tube feedings.     See wound care documentation below     Pressure Injury 11/30/23 Hip Left Stage 2 -  Partial thickness loss of dermis presenting as a shallow open injury with a red, pink wound bed without slough. (Active)  11/30/23 2000  Location: Hip  Location Orientation: Left  Staging: Stage 2 -  Partial thickness loss of dermis presenting as a shallow open injury with a red, pink wound bed without slough.  Wound Description (Comments):   Present on Admission:   Dressing Type Foam - Lift dressing to assess site every shift 12/08/23 2000     Pressure Injury 12/01/23 Knee Left;Posterior Stage 3 -  Full thickness tissue loss. Subcutaneous fat may be visible but bone, tendon or muscle are NOT exposed. (Active)  12/01/23 2000  Location: Knee  Location Orientation: Left;Posterior  Staging: Stage 3 -  Full thickness tissue loss. Subcutaneous fat may be visible but bone, tendon or muscle are NOT exposed.  Wound Description (Comments):   Present on Admission:   Dressing Type Foam - Lift dressing to assess site every shift 12/08/23 2000        Nutrition Problem: Severe Malnutrition Etiology: chronic illness       Signs/Symptoms: severe fat depletion, severe muscle depletion, percent weight loss       Interventions: Tube feeding, MVI, Refer to RD note for recommendations   Estimated body mass index is 18.18 kg/m as calculated from the following:   Height as of this encounter: 6' (1.829 m).   Weight as of this encounter: 60.8 kg.   DVT prophylaxis: Heparin Code Status:Full code Family Communication: No family at bed side. Disposition Plan:    Status is: Inpatient Remains inpatient appropriate because: Severity of illness    Consultants:  Neurology Neurosurgery  Procedures:  MRI , EEG Antimicrobials:  Anti-infectives (From admission,  onward)    None      Subjective: Patient was seen and examined at bedside. Overnight events noted. Patient has core track tube for feeding. He was agitated ,He had partial [place] Patient was very lethargic in the morning, later in the day started following partial commands.  Objective: Vitals:   12/14/23 0813 12/14/23 0900 12/14/23 1132 12/14/23 1230  BP: (!) 142/69  102/84   Pulse: 97  88   Resp: 20  18   Temp: (!) 101.5 F (38.6 C) 99.4 F (37.4 C) 99.9 F (37.7 C) 99.8 F (37.7 C)  TempSrc: Oral Oral Axillary Axillary  SpO2: 96%     Weight:      Height:        Intake/Output Summary (Last 24 hours) at 12/14/2023 1349 Last data filed at 12/14/2023 0349 Gross per 24 hour  Intake --  Output 650 ml  Net -650 ml   Filed Weights   12/12/23 0500 12/13/23 0456 12/14/23 0432  Weight: 60.5 kg 60.7 kg 62.8 kg    Examination:  General exam: Appears comfortable, deconditioned, not in any acute distress. Respiratory system: CTA Bilaterally. Respiratory effort normal.  RR 13 Cardiovascular system: S1 & S2 heard, RRR. No JVD, murmurs, rubs, gallops or clicks.  Gastrointestinal system: Abdomen is non distended, soft and non tender.  Normal bowel sounds heard. Central nervous system: Alert and oriented X 1. No focal neurological deficits.  Extremities: No edema, no cyanosis, no clubbing Skin: No rashes, lesions or ulcers Psychiatry: Mood & affect appropriate.     Data Reviewed: I have personally reviewed following labs and imaging studies  CBC: Recent Labs  Lab 12/10/23 0627 12/11/23 0657 12/12/23 1028 12/13/23 0655 12/14/23 0704  WBC 8.8 10.8* 11.4* 11.6* 12.7*  HGB 7.9* 8.3* 7.8* 8.0* 7.6*  HCT 25.0* 26.5* 25.1* 25.3* 23.8*  MCV 95.4 96.4 96.2 94.4 94.1  PLT 131* 142* 163 167 153   Basic Metabolic Panel: Recent Labs  Lab 12/09/23 0451 12/10/23 0627 12/11/23 0657 12/12/23 1028 12/13/23 0655  NA 149* 148* 146* 143 140  K 4.0 4.2 4.3 4.3 4.2  CL 117* 113* 109  107 104  CO2 28 28 27 26 25   GLUCOSE 174* 171* 154* 142* 158*  BUN 34* 34* 36* 41* 36*  CREATININE 1.45* 1.46* 1.43* 1.40* 1.41*  CALCIUM 8.3* 8.7* 8.8* 8.4* 8.2*  MG 2.4 2.3 2.4 2.5* 2.3  PHOS 3.7 2.9 3.5 3.6 3.0   GFR: Estimated Creatinine Clearance: 41.4 mL/min (A) (by C-G formula based on SCr of 1.41 mg/dL (H)). Liver Function Tests: No results for input(s): "AST", "ALT", "ALKPHOS", "BILITOT", "PROT", "ALBUMIN" in the last 168 hours. No results for input(s): "LIPASE", "AMYLASE" in the last 168 hours. No results for input(s): "AMMONIA" in the last 168 hours. Coagulation Profile: No results for input(s): "INR", "PROTIME" in the last 168 hours. Cardiac Enzymes: No results for input(s): "CKTOTAL", "CKMB", "CKMBINDEX", "TROPONINI" in the last 168 hours. BNP (last 3 results) No results for input(s): "PROBNP" in the last 8760 hours. HbA1C: No results for input(s): "HGBA1C" in the last 72 hours. CBG: Recent Labs  Lab 12/13/23 1948 12/13/23 2325 12/14/23 0448 12/14/23 0811 12/14/23 1228  GLUCAP 163* 224* 281* 239* 219*   Lipid Profile: No results for input(s): "CHOL", "HDL", "LDLCALC", "TRIG", "CHOLHDL", "LDLDIRECT" in the last 72 hours. Thyroid Function Tests: No results for input(s): "TSH", "T4TOTAL", "FREET4", "T3FREE", "THYROIDAB" in the last 72 hours. Anemia Panel: No results for input(s): "VITAMINB12", "FOLATE", "FERRITIN", "TIBC", "IRON", "RETICCTPCT" in the last 72 hours. Sepsis Labs: Recent Labs  Lab 12/08/23 0959  PROCALCITON 0.15    No results found for this or any previous visit (from the past 240 hours).   Radiology Studies: DG CHEST PORT 1 VIEW Result Date: 12/14/2023 CLINICAL DATA:  Fever EXAM: PORTABLE CHEST 1 VIEW COMPARISON:  12/09/2023 FINDINGS: Frontal view of the chest demonstrates enteric catheter passing below diaphragm tip projecting over the gastric fundus. Interval development of right basilar airspace disease, which could reflect aspiration or  infection. No effusion or pneumothorax. No acute bony abnormalities. IMPRESSION: 1. New right basilar airspace disease, which could reflect sequela of aspiration or infection. Electronically Signed   By: Sharlet Salina M.D.   On: 12/14/2023 13:09    Scheduled Meds:  amantadine  100 mg Per Tube BID   amLODipine  5 mg Per Tube Daily   atorvastatin  40 mg Per Tube Daily   feeding supplement (PROSource TF20)  60 mL Per Tube Daily   free water  200 mL Per Tube Q4H   heparin injection (subcutaneous)  5,000 Units Subcutaneous Q12H   insulin aspart  0-15 Units Subcutaneous Q4H   insulin aspart  5 Units Subcutaneous Q4H   metoCLOPramide  10 mg Per Tube TID AC & HS   metoprolol tartrate  50 mg Per Tube BID   multivitamin with minerals  1 tablet Per Tube Daily   mouth rinse  15 mL Mouth Rinse 4 times per day   pantoprazole (PROTONIX) IV  40 mg Intravenous Q12H   polyethylene glycol  17 g Per Tube Daily   senna  1 tablet Per Tube BID   sodium chloride flush  3 mL Intravenous Q12H   Continuous Infusions:  chlorproMAZINE (THORAZINE) 12.5 mg in sodium chloride 0.9 % 25 mL IVPB 12.5 mg (12/13/23 0824)   feeding supplement (OSMOLITE 1.5 CAL) 1,000 mL (12/13/23 1725)     LOS: 14 days    Time spent: 35 mins    Willeen Niece, MD [facility]   If 7PM-7AM, please contact night-coverage

## 2023-12-14 NOTE — Plan of Care (Signed)
 Had fever episode, prn meds given, cold compress applied. Comfortable as of this time.  Problem: Education: Goal: Ability to describe self-care measures that may prevent or decrease complications (Diabetes Survival Skills Education) will improve Outcome: Progressing   Problem: Health Behavior/Discharge Planning: Goal: Ability to manage health-related needs will improve Outcome: Progressing   Problem: Nutritional: Goal: Maintenance of adequate nutrition will improve Outcome: Progressing   Problem: Skin Integrity: Goal: Risk for impaired skin integrity will decrease Outcome: Progressing   Problem: Clinical Measurements: Goal: Will remain free from infection Outcome: Progressing   Problem: Activity: Goal: Risk for activity intolerance will decrease Outcome: Progressing   Problem: Clinical Measurements: Goal: Respiratory complications will improve Outcome: Progressing   Problem: Safety: Goal: Ability to remain free from injury will improve Outcome: Progressing   Problem: Skin Integrity: Goal: Risk for impaired skin integrity will decrease Outcome: Progressing

## 2023-12-14 NOTE — Progress Notes (Signed)
 Pharmacy Antibiotic Note  William Todd is a 73 y.o. male with PMH of hypertension, recurrent mechanical fall, protein caloric malnutrition, diabetes type 2, chronic kidney disease, prostate cancer 2014 admitted on 11/29/2023 now with possible aspiration pneumonia.  Pharmacy has been consulted for unasyn dosing due to recent new fevering.  Scr 1.41 (3/1), stable, eCrCl 41 Tm 103.2 on 3/1, 101.3 on 3/2, WBC 11.6>12.7 increasing On room air, RR 18-20, BP 110-140s, HR 80-110s 3/2 CXR with new right basilar airspace disease-- aspiration or infection  Plan: Unasyn 3g IV every 6 hours Monitor CBC, renal function, cultures, fever curve, and signs of clinical improvement Deescalate as able  Height: 6' (182.9 cm) Weight: 62.8 kg (138 lb 7.2 oz) IBW/kg (Calculated) : 77.6  Temp (24hrs), Avg:100.6 F (38.1 C), Min:97.7 F (36.5 C), Max:103.2 F (39.6 C)  Recent Labs  Lab 12/09/23 0451 12/10/23 0627 12/11/23 0657 12/12/23 1028 12/13/23 0655 12/14/23 0704  WBC 8.0 8.8 10.8* 11.4* 11.6* 12.7*  CREATININE 1.45* 1.46* 1.43* 1.40* 1.41*  --     Estimated Creatinine Clearance: 41.4 mL/min (A) (by C-G formula based on SCr of 1.41 mg/dL (H)).    No Known Allergies  Antimicrobials this admission: 3/2 unasyn  >>    Microbiology results: 2/16 MRSA PCR: negative  Thank you for allowing pharmacy to be a part of this patient's care.  Stephenie Acres, PharmD PGY1 Pharmacy Resident 12/14/2023 2:13 PM

## 2023-12-14 NOTE — Progress Notes (Signed)
   12/14/23 2001  Provider Notification  Provider Name/Title Dr Arville Care  Date Provider Notified 12/14/23  Time Provider Notified 2006  Method of Notification Page  Notification Reason Other (Comment) (Pt's Temp is 103)  Provider response See new orders  Date of Provider Response 12/14/23  Time of Provider Response 2012

## 2023-12-14 NOTE — Progress Notes (Signed)
 Dr Idelle Leech had put in an order for stat Antibiotic to be given so I did ask if I was to wait until ua was obtained, because urine had not been sent yet. Dr said not to wait but to go ahead and give antibiotic now.  Temp is now 99.1.  Has a congested cough.

## 2023-12-14 NOTE — Plan of Care (Signed)
  Problem: Coping: Goal: Level of anxiety will decrease Outcome: Progressing   Problem: Elimination: Goal: Will not experience complications related to bowel motility Outcome: Progressing   Problem: Elimination: Goal: Will not experience complications related to urinary retention Outcome: Progressing   Problem: Intracerebral Hemorrhage Tissue Perfusion: Goal: Complications of Intracerebral Hemorrhage will be minimized Outcome: Progressing   Problem: Coping: Goal: Will verbalize positive feelings about self Outcome: Progressing   Problem: Nutrition: Goal: Risk of aspiration will decrease Outcome: Progressing

## 2023-12-15 DIAGNOSIS — I609 Nontraumatic subarachnoid hemorrhage, unspecified: Secondary | ICD-10-CM | POA: Diagnosis not present

## 2023-12-15 LAB — GLUCOSE, CAPILLARY
Glucose-Capillary: 254 mg/dL — ABNORMAL HIGH (ref 70–99)
Glucose-Capillary: 210 mg/dL — ABNORMAL HIGH (ref 70–99)
Glucose-Capillary: 265 mg/dL — ABNORMAL HIGH (ref 70–99)
Glucose-Capillary: 166 mg/dL — ABNORMAL HIGH (ref 70–99)
Glucose-Capillary: 94 mg/dL (ref 70–99)
Glucose-Capillary: 213 mg/dL — ABNORMAL HIGH (ref 70–99)

## 2023-12-15 LAB — CBC WITH DIFFERENTIAL/PLATELET
Abs Immature Granulocytes: 0.09 10*3/uL — ABNORMAL HIGH (ref 0.00–0.07)
Basophils Absolute: 0 10*3/uL (ref 0.0–0.1)
Basophils Relative: 0 %
Eosinophils Absolute: 0 10*3/uL (ref 0.0–0.5)
Eosinophils Relative: 0 %
HCT: 22.9 % — ABNORMAL LOW (ref 39.0–52.0)
Hemoglobin: 7.3 g/dL — ABNORMAL LOW (ref 13.0–17.0)
Immature Granulocytes: 1 %
Lymphocytes Relative: 3 %
Lymphs Abs: 0.4 10*3/uL — ABNORMAL LOW (ref 0.7–4.0)
MCH: 30.4 pg (ref 26.0–34.0)
MCHC: 31.9 g/dL (ref 30.0–36.0)
MCV: 95.4 fL (ref 80.0–100.0)
Monocytes Absolute: 0.3 10*3/uL (ref 0.1–1.0)
Monocytes Relative: 3 %
Neutro Abs: 10.1 10*3/uL — ABNORMAL HIGH (ref 1.7–7.7)
Neutrophils Relative %: 93 %
Platelets: 153 10*3/uL (ref 150–400)
RBC: 2.4 MIL/uL — ABNORMAL LOW (ref 4.22–5.81)
RDW: 16.9 % — ABNORMAL HIGH (ref 11.5–15.5)
WBC: 10.9 10*3/uL — ABNORMAL HIGH (ref 4.0–10.5)
nRBC: 0 % (ref 0.0–0.2)

## 2023-12-15 LAB — RESP PANEL BY RT-PCR (RSV, FLU A&B, COVID)  RVPGX2
Influenza A by PCR: NEGATIVE
Influenza B by PCR: NEGATIVE
Resp Syncytial Virus by PCR: NEGATIVE
SARS Coronavirus 2 by RT PCR: NEGATIVE

## 2023-12-15 MED ORDER — SODIUM CHLORIDE 0.9 % IV SOLN
2.0000 g | Freq: Two times a day (BID) | INTRAVENOUS | Status: DC
  Administered 2023-12-15: 2 g via INTRAVENOUS
  Filled 2023-12-15: qty 12.5

## 2023-12-15 MED ORDER — SODIUM CHLORIDE 0.9 % IV SOLN
3.0000 g | Freq: Four times a day (QID) | INTRAVENOUS | Status: AC
  Administered 2023-12-15 – 2023-12-20 (×20): 3 g via INTRAVENOUS
  Filled 2023-12-15 (×20): qty 8

## 2023-12-15 NOTE — Progress Notes (Signed)
 PROGRESS NOTE    William Todd  OZH:[id number] DOB: November 23, 1950 DOA: 11/29/2023 PCP: Knox Royalty, MD   Brief Narrative:  This 73 year old Male with past medical history significant for hypertension, recurrents mechanical fall, protein caloric malnutrition, diabetes type 2, chronic kidney disease, prostate cancer 2014 who presents via EMS from [facility] after suffering ground-level fall at [facility].  Workup on admission revealed acute intraparenchymal hemorrhage of the left frontal lobe with multiple bilateral small volume SAH.  Neurosurgery was consulted with no surgical intervention recommended.  While boarding in the ED patient had repeat CT which showed ICH revealed progressive left frontal hematoma with intraventricular extension into the fourth ventricle resulting in anterior midline shift of 5 mm, no hydrocephalus.  Neurosurgery reviewed imaging and recommended admission to the ICU under CCM for close monitoring.   2/15 presented after ground-level fall from SNF workup revealed ICH with bilateral [facility] 2/16 repeat CT while boarding in ED revealed progressive left frontal hematoma with intraventricular extension into the fourth ventricle resulting in anterior midline shift of 5 mm, no hydrocephalus.  Admission changed to ICU 2/18-CT angio, CT head with enlarging clot 2/20 - Stable left frontal hematoma with mild mass effect. 2/26: At this time bleeding has been stable for over a week.  Neurosurgery signed off. 3/1: Code status changed to DNR/DNI. Family at bed side.   Assessment & Plan:   Principal Problem:   Subarachnoid hemorrhage (HCC) Active Problems:   Intraparenchymal hemorrhage of brain (HCC)   Acute metabolic encephalopathy   History of closed left femoral fracture s/p arthroplasty (HCC)   Long-term memory impairment   Acute kidney injury superimposed on chronic kidney disease (HCC)   Essential hypertension   History of prostate cancer   Non-insulin dependent type 2 diabetes  mellitus (HCC)   Normocytic anemia   History of mood disorder   HLD (hyperlipidemia)   ICH (intracerebral hemorrhage) (HCC)   Protein-calorie malnutrition, severe  Left frontal intracerebral traumatic hematoma: Patient presented with B/L SAH sec.to trauma. Status post 7 days of prophylactic Keppra. CT head 12/16 left frontal small ICH with bilateral small SAH.  Traumatic contusion Repeat CT 2/16 showed significant and large hematoma of the left frontal area with multilobe configuration and IVH and SAH, 5 mm midline shift. CTA head and neck: 60% right ICA involved bilateral VA moderate to severe stenosis, moderate left VA origin stenosis and mild to moderate bilateral ICA; MRI with and without contrast unchanged size of large left frontal intraparenchymal hematoma with 4 mm anterior rightward midline shift.  Subarachnoid blood over both hemisphere. EEG showed left frontal sharp waves, moderate diffuse encephalopathy, no seizure. Patient was on aspirin prior to admission,  now off, No antithrombotic due to ICH. At this time bleeding has been stable for over a week.  Neurosurgery signed off. Still not been able to swallow.  Speech and swallow evaluation. Plan is for PEG tube if failed swallow evaluation.  Family is agreeable.   Cerebral edema: Repeat CT 2/17, 5 mm rightward  midline shift. CT 2/18: MLS  7/39mm MRI: 2/22:MLS 4mm Sodium trending down, continue to follow.   AKI CKD stage IIIb: Patient creatinine peaked to 1.7 Trending down  creatinine 1.41  .  Continue to monitor   Hypernatremia: He received hypertonic Saline.  Currently hypertonic off. Continue to monitor sodium , Improved. On free water  Fever:  Continued to spike fevers. Chest x-ray concerning for aspiration pneumonia,  started on Unasyn. Infectious disease consulted. Continue IV Abx. COVID, RSV, influenza  negative  Hypertension: Goal is normotensive. Continue Norvasc and metoprolol.   Diabetes type  2: Sliding scale insulin, 5 units tube feeding coverage every 4 hours   History of frailty with recurrent mechanical falls: PT/OT > SNF   Constipation: Continue MiraLAX and Senokot   Nutrition:, Severe protein caloric malnutrition On tube feedings.    Goals of care discussion: Goals of care discussed in detail with spouse.  Outcome explained. Patient has poor outcome, CODE STATUS changed to DNR/DNI   See wound care documentation below     Pressure Injury 11/30/23 Hip Left Stage 2 -  Partial thickness loss of dermis presenting as a shallow open injury with a red, pink wound bed without slough. (Active)  11/30/23 2000  Location: Hip  Location Orientation: Left  Staging: Stage 2 -  Partial thickness loss of dermis presenting as a shallow open injury with a red, pink wound bed without slough.  Wound Description (Comments):   Present on Admission:   Dressing Type Foam - Lift dressing to assess site every shift 12/08/23 2000     Pressure Injury 12/01/23 Knee Left;Posterior Stage 3 -  Full thickness tissue loss. Subcutaneous fat may be visible but bone, tendon or muscle are NOT exposed. (Active)  12/01/23 2000  Location: Knee  Location Orientation: Left;Posterior  Staging: Stage 3 -  Full thickness tissue loss. Subcutaneous fat may be visible but bone, tendon or muscle are NOT exposed.  Wound Description (Comments):   Present on Admission:   Dressing Type Foam - Lift dressing to assess site every shift 12/08/23 2000        Nutrition Problem: Severe Malnutrition Etiology: chronic illness       Signs/Symptoms: severe fat depletion, severe muscle depletion, percent weight loss       Interventions: Tube feeding, MVI, Refer to RD note for recommendations   Estimated body mass index is 18.18 kg/m as calculated from the following:   Height as of this encounter: 6' (1.829 m).   Weight as of this encounter: 60.8 kg.   DVT prophylaxis: Heparin Code Status:DNR Family  Communication: Wife at bed side Disposition Plan:    Status is: Inpatient Remains inpatient appropriate because: Severity of illness    Consultants:  Neurology Neurosurgery  Procedures:  MRI , EEG Antimicrobials:  Anti-infectives (From admission, onward)    Start     Dose/Rate Route Frequency Ordered Stop   12/15/23 1200  Ampicillin-Sulbactam (UNASYN) 3 g in sodium chloride 0.9 % 100 mL IVPB        3 g 200 mL/hr over 30 Minutes Intravenous Every 6 hours 12/15/23 1018 12/20/23 1159   12/15/23 0845  ceFEPIme (MAXIPIME) 2 g in sodium chloride 0.9 % 100 mL IVPB  Status:  Discontinued        2 g 200 mL/hr over 30 Minutes Intravenous Every 12 hours 12/15/23 0745 12/15/23 1018   12/14/23 1515  Ampicillin-Sulbactam (UNASYN) 3 g in sodium chloride 0.9 % 100 mL IVPB  Status:  Discontinued        3 g 200 mL/hr over 30 Minutes Intravenous Every 6 hours 12/14/23 1422 12/15/23 0745      Subjective: Patient was seen and examined at bedside. Overnight events noted. Patient has cortrek tube for feeding, He was agitated , requiring soft mittens.   Objective: Vitals:   12/15/23 0400 12/15/23 0735 12/15/23 0813 12/15/23 1154  BP: 111/60 119/60  (!) 98/59  Pulse: 87 87  81  Resp: 18 16  18   Temp: Marland Kitchen)  100.7 F (38.2 C) (!) 102.3 F (39.1 C) 99.3 F (37.4 C) 98.5 F (36.9 C)  TempSrc: Axillary Axillary Axillary Axillary  SpO2: 98% 100%  100%  Weight:      Height:        Intake/Output Summary (Last 24 hours) at 12/15/2023 1421 Last data filed at 12/15/2023 1610 Gross per 24 hour  Intake 2793.33 ml  Output 550 ml  Net 2243.33 ml   Filed Weights   12/12/23 0500 12/13/23 0456 12/14/23 0432  Weight: 60.5 kg 60.7 kg 62.8 kg    Examination:  General exam: Appears comfortable, deconditioned, not in any acute distress. Respiratory system: CTA Bilaterally. Respiratory effort normal.  RR 15 Cardiovascular system: S1 & S2 heard, RRR. No JVD, murmurs, rubs, gallops or clicks.   Gastrointestinal system: Abdomen is non distended, soft and non tender.  Normal bowel sounds heard. Central nervous system: Alert and oriented X 1. No focal neurological deficits. Extremities: No edema, no cyanosis, no clubbing Skin: No rashes, lesions or ulcers Psychiatry: Mood & affect appropriate.     Data Reviewed: I have personally reviewed following labs and imaging studies  CBC: Recent Labs  Lab 12/11/23 0657 12/12/23 1028 12/13/23 0655 12/14/23 0704 12/15/23 1043  WBC 10.8* 11.4* 11.6* 12.7* 10.9*  NEUTROABS  --   --   --   --  10.1*  HGB 8.3* 7.8* 8.0* 7.6* 7.3*  HCT 26.5* 25.1* 25.3* 23.8* 22.9*  MCV 96.4 96.2 94.4 94.1 95.4  PLT 142* 163 167 153 153   Basic Metabolic Panel: Recent Labs  Lab 12/09/23 0451 12/10/23 0627 12/11/23 0657 12/12/23 1028 12/13/23 0655  NA 149* 148* 146* 143 140  K 4.0 4.2 4.3 4.3 4.2  CL 117* 113* 109 107 104  CO2 28 28 27 26 25   GLUCOSE 174* 171* 154* 142* 158*  BUN 34* 34* 36* 41* 36*  CREATININE 1.45* 1.46* 1.43* 1.40* 1.41*  CALCIUM 8.3* 8.7* 8.8* 8.4* 8.2*  MG 2.4 2.3 2.4 2.5* 2.3  PHOS 3.7 2.9 3.5 3.6 3.0   GFR: Estimated Creatinine Clearance: 41.4 mL/min (A) (by C-G formula based on SCr of 1.41 mg/dL (H)). Liver Function Tests: No results for input(s): "AST", "ALT", "ALKPHOS", "BILITOT", "PROT", "ALBUMIN" in the last 168 hours. No results for input(s): "LIPASE", "AMYLASE" in the last 168 hours. No results for input(s): "AMMONIA" in the last 168 hours. Coagulation Profile: No results for input(s): "INR", "PROTIME" in the last 168 hours. Cardiac Enzymes: No results for input(s): "CKTOTAL", "CKMB", "CKMBINDEX", "TROPONINI" in the last 168 hours. BNP (last 3 results) No results for input(s): "PROBNP" in the last 8760 hours. HbA1C: No results for input(s): "HGBA1C" in the last 72 hours. CBG: Recent Labs  Lab 12/14/23 2032 12/14/23 2354 12/15/23 0431 12/15/23 0741 12/15/23 1152  GLUCAP 119* 184* 254* 210* 265*    Lipid Profile: No results for input(s): "CHOL", "HDL", "LDLCALC", "TRIG", "CHOLHDL", "LDLDIRECT" in the last 72 hours. Thyroid Function Tests: No results for input(s): "TSH", "T4TOTAL", "FREET4", "T3FREE", "THYROIDAB" in the last 72 hours. Anemia Panel: No results for input(s): "VITAMINB12", "FOLATE", "FERRITIN", "TIBC", "IRON", "RETICCTPCT" in the last 72 hours. Sepsis Labs: No results for input(s): "PROCALCITON", "LATICACIDVEN" in the last 168 hours.   Recent Results (from the past 240 hours)  Culture, blood (single) w Reflex to ID Panel     Status: None (Preliminary result)   Collection Time: 12/14/23  9:48 PM   Specimen: BLOOD LEFT ARM  Result Value Ref Range Status   Specimen Description  BLOOD LEFT ARM  Final   Special Requests   Final    BOTTLES DRAWN AEROBIC AND ANAEROBIC Blood Culture results may not be optimal due to an inadequate volume of blood received in culture bottles   Culture   Final    NO GROWTH < 12 HOURS Performed at [facility], [address], [place], [place] [address]    Report Status PENDING  Incomplete  Resp panel by RT-PCR (RSV, Flu A&B, Covid) Anterior Nasal Swab     Status: None   Collection Time: 12/15/23  8:07 AM   Specimen: Anterior Nasal Swab  Result Value Ref Range Status   SARS Coronavirus 2 by RT PCR NEGATIVE NEGATIVE Final   Influenza A by PCR NEGATIVE NEGATIVE Final   Influenza B by PCR NEGATIVE NEGATIVE Final    Comment: (NOTE) The Xpert Xpress SARS-CoV-2/FLU/RSV plus assay is intended as an aid in the diagnosis of influenza from Nasopharyngeal swab specimens and should not be used as a sole basis for treatment. Nasal washings and aspirates are unacceptable for Xpert Xpress SARS-CoV-2/FLU/RSV testing.  Fact Sheet for Patients: BloggerCourse.com  Fact Sheet for Healthcare Providers: SeriousBroker.it  This test is not yet approved or cleared by the [place] FDA and has  been authorized for detection and/or diagnosis of SARS-CoV-2 by FDA under an Emergency Use Authorization (EUA). This EUA will remain in effect (meaning this test can be used) for the duration of the COVID-19 declaration under Section 564(b)(1) of the Act, 21 U.S.C. section 360bbb-3(b)(1), unless the authorization is terminated or revoked.     Resp Syncytial Virus by PCR NEGATIVE NEGATIVE Final    Comment: (NOTE) Fact Sheet for Patients: BloggerCourse.com  Fact Sheet for Healthcare Providers: SeriousBroker.it  This test is not yet approved or cleared by the [place] FDA and has been authorized for detection and/or diagnosis of SARS-CoV-2 by FDA under an Emergency Use Authorization (EUA). This EUA will remain in effect (meaning this test can be used) for the duration of the COVID-19 declaration under Section 564(b)(1) of the Act, 21 U.S.C. section 360bbb-3(b)(1), unless the authorization is terminated or revoked.  Performed at [facility], [address], [place], [place] [address]      Radiology Studies: DG CHEST PORT 1 VIEW Result Date: 12/14/2023 CLINICAL DATA:  Fever EXAM: PORTABLE CHEST 1 VIEW COMPARISON:  12/09/2023 FINDINGS: Frontal view of the chest demonstrates enteric catheter passing below diaphragm tip projecting over the gastric fundus. Interval development of right basilar airspace disease, which could reflect aspiration or infection. No effusion or pneumothorax. No acute bony abnormalities. IMPRESSION: 1. New right basilar airspace disease, which could reflect sequela of aspiration or infection. Electronically Signed   By: Sharlet Salina M.D.   On: 12/14/2023 13:09    Scheduled Meds:  amantadine  100 mg Per Tube BID   amLODipine  5 mg Per Tube Daily   atorvastatin  40 mg Per Tube Daily   feeding supplement (PROSource TF20)  60 mL Per Tube Daily   free water  200 mL Per Tube Q4H   heparin injection  (subcutaneous)  5,000 Units Subcutaneous Q12H   insulin aspart  0-15 Units Subcutaneous Q4H   insulin aspart  5 Units Subcutaneous Q4H   metoCLOPramide  10 mg Per Tube TID AC & HS   metoprolol tartrate  50 mg Per Tube BID   multivitamin with minerals  1 tablet Per Tube Daily   mouth rinse  15 mL Mouth Rinse 4  times per day   pantoprazole (PROTONIX) IV  40 mg Intravenous Q12H   polyethylene glycol  17 g Per Tube Daily   senna  1 tablet Per Tube BID   sodium chloride flush  3 mL Intravenous Q12H   Continuous Infusions:  ampicillin-sulbactam (UNASYN) IV 3 g (12/15/23 1141)   chlorproMAZINE (THORAZINE) 12.5 mg in sodium chloride 0.9 % 25 mL IVPB 12.5 mg (12/13/23 0824)   feeding supplement (OSMOLITE 1.5 CAL) 50 mL/hr at 12/15/23 0253     LOS: 15 days    Time spent: 50 mins    Willeen Niece, MD [facility]   If 7PM-7AM, please contact night-coverage

## 2023-12-15 NOTE — Care Plan (Signed)
  Interdisciplinary Goals of Care Family Meeting   Date carried out: 12/15/2023  Location of the meeting: Bedside  Member's involved: Physician, Bedside Registered Nurse, Social Worker, and Family Member or next of kin  Durable Power of Attorney or acting medical decision maker: Wife William Todd ,[place])  Discussion: We discussed goals of care for William Todd .    Code status:   Code Status: Limited: Do not attempt resuscitation (DNR) -DNR-LIMITED -Do Not Intubate/DNI    Disposition: SNF/LTAC  Time spent for the meeting: 25 mins    Willeen Niece, MD  12/15/2023, 10:44 AM

## 2023-12-15 NOTE — Consult Note (Signed)
 [facility]    Date of Admission:  11/29/2023     Reason for Consult: fever    Referring Provider: Idelle Leech      Abx: 3/03-c amp-sulb        Assessment: 73 yo male with SAH here since 2/15, remained altered, id called for sepsis epsisode 3/1 suspected to be aspiration pna   Abx just started today, so would like to see how he responds prior to further investigation  Cxr does suggest rll opacity and given ams and recent stroke and debilitation most common explanation would be aspiration pna  Other ddx for fever less likely drug fever or nosocomial bacteremia or acalculous cholecystitis but will send screening labs   Plan: F/u 3/2 bcx result Serial (3 days lfts and differential) Continue amp-sulb Standard isolation precaution Discussed with primary team     ------------------------------------------------ Principal Problem:   Subarachnoid hemorrhage (HCC) Active Problems:   History of closed left femoral fracture s/p arthroplasty (HCC)   Long-term memory impairment   Acute kidney injury superimposed on chronic kidney disease (HCC)   Intraparenchymal hemorrhage of brain (HCC)   Essential hypertension   History of prostate cancer   Non-insulin dependent type 2 diabetes mellitus (HCC)   Normocytic anemia   History of mood disorder   HLD (hyperlipidemia)   Acute metabolic encephalopathy   ICH (intracerebral hemorrhage) (HCC)   Protein-calorie malnutrition, severe    HPI: William Todd is a 73 y.o. male htn, dm2, ckd, hx prostate cancer, bib ambulance from nursing home after a gould level fall found to have left frontal lobe iph and several multipole bilateral small volume SAH  Transferred to floor and had developed sepsis 3/1 Cxr showed rll opacity Started on abx today Febrile 2 days Bcx 3/2 ngtd 3/2 urine small LE; rare bacteria 6-10 wbc  Patient is altered in mitt restraint and couldn't give a history  Hx via chart  review   No rash, diarrhea, n/v  Not on oxygen supplement   Family History  Problem Relation Age of Onset   Prostate cancer Brother 37       seed implant    Social History   Tobacco Use   Smoking status: Never   Smokeless tobacco: Never  Substance Use Topics   Alcohol use: No    Comment: quit years  ago   Drug use: No    No Known Allergies  Review of Systems: ROS All Other ROS was negative, except mentioned above   Past Medical History:  Diagnosis Date   Anxiety    new dx   Chronic kidney disease    Diabetes mellitus    type ii   History of radiation therapy 09/29/13- 11/25/13   prostate 7800 cGy 40 sessions, seminal vesicles 5600 cGy 40 sessions   Hypertension    Prostate cancer (HCC) 06/01/2013   gleason 7       Scheduled Meds:  amantadine  100 mg Per Tube BID   amLODipine  5 mg Per Tube Daily   atorvastatin  40 mg Per Tube Daily   feeding supplement (PROSource TF20)  60 mL Per Tube Daily   free water  200 mL Per Tube Q4H   heparin injection (subcutaneous)  5,000 Units Subcutaneous Q12H   insulin aspart  0-15 Units Subcutaneous Q4H   insulin aspart  5 Units Subcutaneous Q4H   metoCLOPramide  10 mg Per Tube TID AC & HS   metoprolol  tartrate  50 mg Per Tube BID   multivitamin with minerals  1 tablet Per Tube Daily   mouth rinse  15 mL Mouth Rinse 4 times per day   pantoprazole (PROTONIX) IV  40 mg Intravenous Q12H   polyethylene glycol  17 g Per Tube Daily   senna  1 tablet Per Tube BID   sodium chloride flush  3 mL Intravenous Q12H   Continuous Infusions:  ampicillin-sulbactam (UNASYN) IV Stopped (12/15/23 1144)   chlorproMAZINE (THORAZINE) 12.5 mg in sodium chloride 0.9 % 25 mL IVPB 12.5 mg (12/13/23 0824)   feeding supplement (OSMOLITE 1.5 CAL) 50 mL/hr at 12/15/23 1601   PRN Meds:.acetaminophen (TYLENOL) oral liquid 160 mg/5 mL, acetaminophen, chlorproMAZINE (THORAZINE) 12.5 mg in sodium chloride 0.9 % 25 mL IVPB, hydrALAZINE, labetalol,  [DISCONTINUED] ondansetron **OR** ondansetron (ZOFRAN) IV, mouth rinse   OBJECTIVE: Blood pressure (!) 109/55, pulse 83, temperature 98.6 F (37 C), temperature source Oral, resp. rate 20, height 6' (1.829 m), weight 62.8 kg, SpO2 98%.  Physical Exam  General/constitutional: in restraint; eye open; some minimal interaction; nonverbal HEENT: Normocephalic, PER, Conj Clear, EOMI, Oropharynx clear Neck supple CV: rrr no mrg Lungs: clear to auscultation, normal respiratory effort Abd: Soft, Nontender Ext: no edema Skin: No Rash Neuro: generalized weakness; withdraw feet to me tickling him bilaterally; both hands fighting mitt restraint MSK: no peripheral joint swelling/tenderness/warmth  Lab Results Lab Results  Component Value Date   WBC 10.9 (H) 12/15/2023   HGB 7.3 (L) 12/15/2023   HCT 22.9 (L) 12/15/2023   MCV 95.4 12/15/2023   PLT 153 12/15/2023    Lab Results  Component Value Date   CREATININE 1.41 (H) 12/13/2023   BUN 36 (H) 12/13/2023   NA 140 12/13/2023   K 4.2 12/13/2023   CL 104 12/13/2023   CO2 25 12/13/2023    Lab Results  Component Value Date   ALT 45 (H) 11/30/2023   AST 43 (H) 11/30/2023   ALKPHOS 90 11/30/2023   BILITOT 0.6 11/30/2023      Microbiology: Recent Results (from the past 240 hours)  Culture, blood (single) w Reflex to ID Panel     Status: None (Preliminary result)   Collection Time: 12/14/23  9:48 PM   Specimen: BLOOD LEFT ARM  Result Value Ref Range Status   Specimen Description BLOOD LEFT ARM  Final   Special Requests   Final    BOTTLES DRAWN AEROBIC AND ANAEROBIC Blood Culture results may not be optimal due to an inadequate volume of blood received in culture bottles   Culture   Final    NO GROWTH < 12 HOURS Performed at [facility], [address] [place], [place] [address]    Report Status PENDING  Incomplete  Resp panel by RT-PCR (RSV, Flu A&B, Covid) Anterior Nasal Swab     Status: None   Collection Time: 12/15/23   8:07 AM   Specimen: Anterior Nasal Swab  Result Value Ref Range Status   SARS Coronavirus 2 by RT PCR NEGATIVE NEGATIVE Final   Influenza A by PCR NEGATIVE NEGATIVE Final   Influenza B by PCR NEGATIVE NEGATIVE Final    Comment: (NOTE) The Xpert Xpress SARS-CoV-2/FLU/RSV plus assay is intended as an aid in the diagnosis of influenza from Nasopharyngeal swab specimens and should not be used as a sole basis for treatment. Nasal washings and aspirates are unacceptable for Xpert Xpress SARS-CoV-2/FLU/RSV testing.  Fact Sheet for Patients: BloggerCourse.com  Fact Sheet for Healthcare Providers:  SeriousBroker.it  This test is not yet approved or cleared by the [place] and has been authorized for detection and/or diagnosis of SARS-CoV-2 by FDA under an Emergency Use Authorization (EUA). This EUA will remain in effect (meaning this test can be used) for the duration of the COVID-19 declaration under Section 564(b)(1) of the Act, 21 U.S.C. section 360bbb-3(b)(1), unless the authorization is terminated or revoked.     Resp Syncytial Virus by PCR NEGATIVE NEGATIVE Final    Comment: (NOTE) Fact Sheet for Patients: BloggerCourse.com  Fact Sheet for Healthcare Providers: SeriousBroker.it  This test is not yet approved or cleared by the [place] FDA and has been authorized for detection and/or diagnosis of SARS-CoV-2 by FDA under an Emergency Use Authorization (EUA). This EUA will remain in effect (meaning this test can be used) for the duration of the COVID-19 declaration under Section 564(b)(1) of the Act, 21 U.S.C. section 360bbb-3(b)(1), unless the authorization is terminated or revoked.  Performed at [facility], [address] [place], [place] [address]      Serology:    Imaging: If present, new imagings (plain films, ct scans, and mri) have been  personally visualized and interpreted; radiology reports have been reviewed. Decision making incorporated into the Impression / Recommendations.  3/2 cxr FINDINGS: Frontal view of the chest demonstrates enteric catheter passing below diaphragm tip projecting over the gastric fundus. Interval development of right basilar airspace disease, which could reflect aspiration or infection. No effusion or pneumothorax. No acute bony abnormalities.   IMPRESSION: 1. New right basilar airspace disease, which could reflect sequela of aspiration or infection.    2/28 head ct Expected evolution of large left frontal hematoma with intraventricular and subarachnoid extension. Anterior midline shift is similar to prior at 7 mm. No new abnormality.  Raymondo Band, MD [facility] [phone] pager    12/15/2023, 4:25 PM

## 2023-12-15 NOTE — Progress Notes (Signed)
 Nutrition Follow-up  DOCUMENTATION CODES:  Severe malnutrition in context of chronic illness  INTERVENTION:  Continue tube feeding via Cortrak: Osmolite 1.5 at 50 ml/h (1200 ml per day) Prosource TF20 60 ml daily Free Water Flushes Q4 hours   Provides 1880 kcal, 95 gm protein, free water daily    MVI w/ minerals Continue bowel regimen  Monitor SLP notes for advancement in oral diet  NUTRITION DIAGNOSIS:  Severe Malnutrition related to chronic illness as evidenced by severe fat depletion, severe muscle depletion, percent weight loss.  Still relevant  GOAL:  Patient will meet greater than or equal to 90% of their needs  Meeting with tube feeds  MONITOR:  Diet advancement, TF tolerance, Weight trends, Labs, Skin  REASON FOR ASSESSMENT:  Consult Enteral/tube feeding initiation and management, Assessment of nutrition requirement/status  ASSESSMENT:  Pt recently discharged on 2/6 after mechanical fall at home resulting in L femoral neck fracture requiring L hip arthroplasty. Discharged to [facility] where he fell again resulting in ICH. PMH: CAD, HLD, T2DM, HTN, anxiety, and prostate cancer (radiation 09/29/13-11/25/13). No formal dx of dementia, however with poor short term memory.  Spoke with RN and pt's wife. Pt very lethargic due to current fever. RN reports fever reached as high as 103 and has caused lethargy. Most recent temperature 98.5. Discussed reaching out to MD about inputting new SLP consult when pt's fever is gone and he's more awake to assess swallow since SLP has already signed off and will need to reassess before placing PEG tube.   Pt tolerating Osmolite 1.5 at goal rate and no issues reported by RN. Discussed next steps with pt's wife about the tube feeding based on how next SLP consult goes.  Admit weight: 60kg Current weight: no current wt  Last Weight  Most recent update: 12/14/2023  4:32 AM    Weight  62.8 kg (138 lb 7.2 oz)             Nutritionally Relevant Medications: Scheduled Meds:  amantadine  100 mg Per Tube BID   amLODipine  5 mg Per Tube Daily   atorvastatin  40 mg Per Tube Daily   feeding supplement (PROSource TF20)  60 mL Per Tube Daily   free water  200 mL Per Tube Q4H   heparin injection (subcutaneous)  5,000 Units Subcutaneous Q12H   insulin aspart  0-15 Units Subcutaneous Q4H   insulin aspart  5 Units Subcutaneous Q4H   multivitamin with minerals  1 tablet Per Tube Daily   pantoprazole (PROTONIX) IV  40 mg Intravenous Q12H   senna  1 tablet Per Tube BID   Continuous Infusions:  ceFEPime (MAXIPIME) IV 2 g (12/15/23 0807)   chlorproMAZINE (THORAZINE) 12.5 mg in sodium chloride 0.9 % 25 mL IVPB 12.5 mg (12/13/23 0824)   feeding supplement (OSMOLITE 1.5 CAL) 50 mL/hr at 12/15/23 0253   PRN Meds:.acetaminophen (TYLENOL) oral liquid 160 mg/5 mL, ondansetron (ZOFRAN) IV  Labs Reviewed: BUN 36 GFR 53 Hgb 7.6 CBG ranges from 119-254 mg/dL over the last 24 hours (most recent 210) HgbA1c 6.1 (2/18)   Diet Order:   Diet Order             Diet NPO time specified  Diet effective now                  EDUCATION NEEDS:  Not appropriate for education at this time  Skin:  Skin Assessment: Skin Integrity Issues: Skin Integrity Issues:: Stage III, Stage  II Stage II: L hip arthroplasty site Stage III: L posterior knee  Last BM:  2/24 - type 1  Height:  Ht Readings from Last 1 Encounters:  12/07/23 6' (1.829 m)   Weight:  Wt Readings from Last 1 Encounters:  12/14/23 62.8 kg   Ideal Body Weight:  83.6 kg  BMI:  Body mass index is 18.78 kg/m.  Estimated Nutritional Needs:   Kcal:  1800-2000kcal  Protein:  90-100g  Fluid:  >1.8L/day  Louis Meckel Dietetic Intern

## 2023-12-15 NOTE — Plan of Care (Signed)
 Wife changed status to DNR.  Dr was at side and explained to her what that means and answered all her questions.     Problem: Safety: Goal: Ability to remain free from injury will improve Outcome: Progressing   Problem: Skin Integrity: Goal: Risk for impaired skin integrity will decrease Outcome: Progressing   Problem: Coping: Goal: Will verbalize positive feelings about self Outcome: Progressing   Problem: Nutrition: Goal: Risk of aspiration will decrease Outcome: Progressing Goal: Dietary intake will improve Outcome: Progressing

## 2023-12-16 DIAGNOSIS — J690 Pneumonitis due to inhalation of food and vomit: Secondary | ICD-10-CM | POA: Diagnosis not present

## 2023-12-16 DIAGNOSIS — Z515 Encounter for palliative care: Secondary | ICD-10-CM

## 2023-12-16 DIAGNOSIS — Z7189 Other specified counseling: Secondary | ICD-10-CM

## 2023-12-16 DIAGNOSIS — I609 Nontraumatic subarachnoid hemorrhage, unspecified: Secondary | ICD-10-CM | POA: Diagnosis not present

## 2023-12-16 DIAGNOSIS — R410 Disorientation, unspecified: Secondary | ICD-10-CM

## 2023-12-16 LAB — CBC WITH DIFFERENTIAL/PLATELET
Abs Immature Granulocytes: 0.11 10*3/uL — ABNORMAL HIGH (ref 0.00–0.07)
Basophils Absolute: 0 10*3/uL (ref 0.0–0.1)
Basophils Relative: 0 %
Eosinophils Absolute: 0.1 10*3/uL (ref 0.0–0.5)
Eosinophils Relative: 1 %
HCT: 23.1 % — ABNORMAL LOW (ref 39.0–52.0)
Hemoglobin: 7.4 g/dL — ABNORMAL LOW (ref 13.0–17.0)
Immature Granulocytes: 1 %
Lymphocytes Relative: 4 %
Lymphs Abs: 0.4 10*3/uL — ABNORMAL LOW (ref 0.7–4.0)
MCH: 30.7 pg (ref 26.0–34.0)
MCHC: 32 g/dL (ref 30.0–36.0)
MCV: 95.9 fL (ref 80.0–100.0)
Monocytes Absolute: 0.4 10*3/uL (ref 0.1–1.0)
Monocytes Relative: 4 %
Neutro Abs: 9.5 10*3/uL — ABNORMAL HIGH (ref 1.7–7.7)
Neutrophils Relative %: 90 %
Platelets: 180 10*3/uL (ref 150–400)
RBC: 2.41 MIL/uL — ABNORMAL LOW (ref 4.22–5.81)
RDW: 16.8 % — ABNORMAL HIGH (ref 11.5–15.5)
WBC: 10.5 10*3/uL (ref 4.0–10.5)
nRBC: 0 % (ref 0.0–0.2)

## 2023-12-16 LAB — GLUCOSE, CAPILLARY
Glucose-Capillary: 171 mg/dL — ABNORMAL HIGH (ref 70–99)
Glucose-Capillary: 184 mg/dL — ABNORMAL HIGH (ref 70–99)
Glucose-Capillary: 203 mg/dL — ABNORMAL HIGH (ref 70–99)
Glucose-Capillary: 227 mg/dL — ABNORMAL HIGH (ref 70–99)
Glucose-Capillary: 232 mg/dL — ABNORMAL HIGH (ref 70–99)
Glucose-Capillary: 263 mg/dL — ABNORMAL HIGH (ref 70–99)

## 2023-12-16 LAB — HEPATIC FUNCTION PANEL
ALT: 100 U/L — ABNORMAL HIGH (ref 0–44)
AST: 107 U/L — ABNORMAL HIGH (ref 15–41)
Albumin: 1.9 g/dL — ABNORMAL LOW (ref 3.5–5.0)
Alkaline Phosphatase: 106 U/L (ref 38–126)
Bilirubin, Direct: 0.2 mg/dL (ref 0.0–0.2)
Indirect Bilirubin: 0.5 mg/dL (ref 0.3–0.9)
Total Bilirubin: 0.7 mg/dL (ref 0.0–1.2)
Total Protein: 5.5 g/dL — ABNORMAL LOW (ref 6.5–8.1)

## 2023-12-16 NOTE — Progress Notes (Signed)
 Diagnosis: Sah Aspiration pna Delirium  3/2 bcx negative   Clinically improving in terms of sepsis on antibiotics  -continue/finish total 5 days amp-sulbactam for aspiration pna -aspiration precaution -standard isolation precaution -id will sign off -discussed with primary team     Subjective: Wife at bedside and updated her on what's going on Afebrile Wbc improving Remains on room air    Meds:  amantadine  100 mg Per Tube BID   amLODipine  5 mg Per Tube Daily   atorvastatin  40 mg Per Tube Daily   feeding supplement (PROSource TF20)  60 mL Per Tube Daily   free water  200 mL Per Tube Q4H   heparin injection (subcutaneous)  5,000 Units Subcutaneous Q12H   insulin aspart  0-15 Units Subcutaneous Q4H   insulin aspart  5 Units Subcutaneous Q4H   metoCLOPramide  10 mg Per Tube TID AC & HS   metoprolol tartrate  50 mg Per Tube BID   multivitamin with minerals  1 tablet Per Tube Daily   mouth rinse  15 mL Mouth Rinse 4 times per day   pantoprazole (PROTONIX) IV  40 mg Intravenous Q12H   polyethylene glycol  17 g Per Tube Daily   senna  1 tablet Per Tube BID   sodium chloride flush  3 mL Intravenous Q12H    ampicillin-sulbactam (UNASYN) IV 3 g (12/16/23 1224)   chlorproMAZINE (THORAZINE) 12.5 mg in sodium chloride 0.9 % 25 mL IVPB 12.5 mg (12/13/23 0824)   feeding supplement (OSMOLITE 1.5 CAL) 50 mL/hr at 12/16/23 0418   Exam: Vitals:   12/16/23 0755 12/16/23 1117  BP: (!) 124/58 121/66  Pulse: 95 82  Resp: 18 18  Temp: 99 F (37.2 C) 98.8 F (37.1 C)  SpO2: 100% 100%   General/constitutional: no distress, confused; no significant interaction; eyes open and tracking; in restraint mitts HEENT: Normocephalic, PER, Conj Clear; ngt present CV: rrr no mrg Lungs: normal respiratory effort Abd: Soft, Nontender Ext: no edema Skin: No Rash    Labs: Lab Results  Component Value Date   WBC 10.5 12/16/2023   HGB 7.4 (L) 12/16/2023   HCT 23.1 (L) 12/16/2023    MCV 95.9 12/16/2023   PLT 180 12/16/2023   Last metabolic panel Lab Results  Component Value Date   GLUCOSE 158 (H) 12/13/2023   NA 140 12/13/2023   K 4.2 12/13/2023   CL 104 12/13/2023   CO2 25 12/13/2023   BUN 36 (H) 12/13/2023   CREATININE 1.41 (H) 12/13/2023   GFRNONAA 53 (L) 12/13/2023   CALCIUM 8.2 (L) 12/13/2023   PHOS 3.0 12/13/2023   PROT 5.5 (L) 12/16/2023   ALBUMIN 1.9 (L) 12/16/2023   BILITOT 0.7 12/16/2023   ALKPHOS 106 12/16/2023   AST 107 (H) 12/16/2023   ALT 100 (H) 12/16/2023   ANIONGAP 11 12/13/2023   Imaging: Reviewed         Raymondo Band, MD [facility] [phone]  pager   [phone] cell 12/16/2023, 1:52 PM

## 2023-12-16 NOTE — Consult Note (Signed)
 Consultation Note Date: 12/16/2023   Patient Name: William Todd  DOB: 1950/10/24  MRN: [id number]  Age / Sex: 73 y.o., male  PCP: Knox Royalty, MD Referring Physician: Willeen Niece, MD  Reason for Consultation: Establishing goals of care  HPI/Patient Profile: 73 y.o. male  with past medical history of CAD, HTN, HLD, diabetes, prostate cancer (s/p radiation therapy), cognitive impairment, fall with left hip fracture s/p surgical intervention 11/16/23 admitted on 11/29/[facility] from [facility] with fall with acute left frontal lobe large intraparenchymal hemorrhage (likely traumatic) with bilateral small SAH. Progression 2/17 with intraparenchymal hematoma in left frontal lobe with mass effect and midline shift. NPO status with concerns for ongoing aspiration. Unable to participate in therapies due to declined mental state and unable to participate.   Clinical Assessment and Goals of Care: Consult received and chart review completed. Complicated hospitalization. I met today at William Todd bedside along with his wife. I reviewed with William Todd her husband's baseline and progress. She tells me that he was fully functional at home (able to clean, fix food, care for himself) prior to his fall and fracture. She also reports that he was progressing well at [facility] rehab and they were making plans for him to return home. She talks about him sitting with the nurses and seems he may have had times of increased confusion at [facility] rehab.   We spent time reviewing his progression here. We reviewed concern for aspiration pneumonia noting there was some concern for aspiration in ICU. We spent time discussing his best days this admission and this was 2/21 when he was able to verbalize, smile, and have some interaction with family. Unfortunately he has not returned to this state. I expressed my concern for his severe swallowing deficit. I  explained the benefits of PEG tube to provide nutrition but also not preventing further aspiration which I worry will continue to be a problem for Mr. Buonocore. I expressed my concern that he is unlikely to have significant improvement from here. He is unfortunately unable to participate with therapies which is limiting his progress. Mrs. Langhorst shares that her understanding is that with time he would have some improvement. The idea that he may not have much improvement is very surprising for her. I acknowledge the difficulty in this conversation and information. We agree to continue treatment and antibiotics with hopes of improvement while we continue to discuss goals of care and risks vs benefits of PEG tube. I left her to have some time to digest our conversation today.   All questions/concerns addressed to the best of my ability. Emotional support provided. I discussed with Dr. Idelle Leech.   Primary Decision Maker NEXT OF KIN wife    SUMMARY OF RECOMMENDATIONS   - DNR in place - Ongoing goals of care conversations  Code Status/Advance Care Planning: DNR   Symptom Management:  Per attending  Prognosis:  Overall prognosis poor.   Discharge Planning: To Be Determined      Primary Diagnoses: Present on Admission:  History of  closed left femoral fracture s/p arthroplasty (HCC)  Acute kidney injury superimposed on chronic kidney disease (HCC)  Long-term memory impairment  ICH (intracerebral hemorrhage) (HCC)   I have reviewed the medical record, interviewed the patient and family, and examined the patient. The following aspects are pertinent.  Past Medical History:  Diagnosis Date   Anxiety    new dx   Chronic kidney disease    Diabetes mellitus    type ii   History of radiation therapy 09/29/13- 11/25/13   prostate 7800 cGy 40 sessions, seminal vesicles 5600 cGy 40 sessions   Hypertension    Prostate cancer (HCC) 06/01/2013   gleason 7   Social History   Socioeconomic  History   Marital status: Married    Spouse name: Not on file   Number of children: Not on file   Years of education: Not on file   Highest education level: Not on file  Occupational History   Not on file  Tobacco Use   Smoking status: Never   Smokeless tobacco: Never  Substance and Sexual Activity   Alcohol use: No    Comment: quit years  ago   Drug use: No   Sexual activity: Not Currently  Other Topics Concern   Not on file  Social History Narrative   Not on file   Social Drivers of Health   Financial Resource Strain: Not on file  Food Insecurity: No Food Insecurity (11/30/2023)   Hunger Vital Sign    Worried About Running Out of Food in the Last Year: Never true    Ran Out of Food in the Last Year: Never true  Transportation Needs: No Transportation Needs (11/30/2023)   PRAPARE - Administrator, Civil Service (Medical): No    Lack of Transportation (Non-Medical): No  Physical Activity: Not on file  Stress: Not on file  Social Connections: Moderately Integrated (11/30/2023)   Social Connection and Isolation Panel [NHANES]    Frequency of Communication with Friends and Family: More than three times a week    Frequency of Social Gatherings with Friends and Family: More than three times a week    Attends Religious Services: More than 4 times per year    Active Member of [employer] or Organizations: No    Attends Engineer, structural: Never    Marital Status: Married   Family History  Problem Relation Age of Onset   Prostate cancer Brother 73       seed implant   Scheduled Meds:  amantadine  100 mg Per Tube BID   amLODipine  5 mg Per Tube Daily   atorvastatin  40 mg Per Tube Daily   feeding supplement (PROSource TF20)  60 mL Per Tube Daily   free water  200 mL Per Tube Q4H   heparin injection (subcutaneous)  5,000 Units Subcutaneous Q12H   insulin aspart  0-15 Units Subcutaneous Q4H   insulin aspart  5 Units Subcutaneous Q4H   metoCLOPramide  10 mg  Per Tube TID AC & HS   metoprolol tartrate  50 mg Per Tube BID   multivitamin with minerals  1 tablet Per Tube Daily   mouth rinse  15 mL Mouth Rinse 4 times per day   pantoprazole (PROTONIX) IV  40 mg Intravenous Q12H   polyethylene glycol  17 g Per Tube Daily   senna  1 tablet Per Tube BID   sodium chloride flush  3 mL Intravenous Q12H   Continuous Infusions:  ampicillin-sulbactam (  UNASYN) IV 3 g (12/16/23 0558)   chlorproMAZINE (THORAZINE) 12.5 mg in sodium chloride 0.9 % 25 mL IVPB 12.5 mg (12/13/23 0824)   feeding supplement (OSMOLITE 1.5 CAL) 50 mL/hr at 12/16/23 0418   PRN Meds:.acetaminophen (TYLENOL) oral liquid 160 mg/5 mL, acetaminophen, chlorproMAZINE (THORAZINE) 12.5 mg in sodium chloride 0.9 % 25 mL IVPB, hydrALAZINE, labetalol, [DISCONTINUED] ondansetron **OR** ondansetron (ZOFRAN) IV, mouth rinse No Known Allergies Review of Systems  Unable to perform ROS: Acuity of condition    Physical Exam Vitals and nursing note reviewed.  Constitutional:      General: He is not in acute distress.    Appearance: He is ill-appearing.  Cardiovascular:     Rate and Rhythm: Normal rate.  Pulmonary:     Effort: No tachypnea, accessory muscle usage or respiratory distress.     Comments: + congestion; poor control of secretions Abdominal:     General: Abdomen is flat.  Neurological:     Mental Status: He is lethargic.     Comments: Non-verbal; does not follow commands     Vital Signs: BP (!) 124/58 (BP Location: Right Arm)   Pulse 95   Temp 99 F (37.2 C) (Oral)   Resp 18   Ht 6' (1.829 m)   Wt 66.6 kg   SpO2 100%   BMI 19.91 kg/m  Pain Scale: PAINAD POSS *See Group Information*: S-Acceptable,Sleep, easy to arouse Pain Score: 0-No pain   SpO2: SpO2: 100 % O2 Device:SpO2: 100 % O2 Flow Rate: .   IO: Intake/output summary:  Intake/Output Summary (Last 24 hours) at 12/16/2023 1002 Last data filed at 12/16/2023 0418 Gross per 24 hour  Intake 1474.18 ml  Output 1200  ml  Net 274.18 ml    LBM: Last BM Date : 12/16/23 Baseline Weight: Weight: 61.9 kg Most recent weight: Weight: 66.6 kg     Palliative Assessment/Data:    Time Total: 60 min  Greater than 50%  of this time was spent counseling and coordinating care related to the above assessment and plan.  Signed by: Yong Channel, NP Palliative Medicine Team Pager # [phone] (M-F 8a-5p) Team Phone # [phone] (Nights/Weekends)

## 2023-12-16 NOTE — Progress Notes (Signed)
 PROGRESS NOTE    William Todd  WUX:[id number] DOB: August 23, 1951 DOA: 11/29/2023 PCP: Knox Royalty, MD   Brief Narrative:  This 73 year old Male with past medical history significant for hypertension, recurrents mechanical fall, protein caloric malnutrition, diabetes type 2, chronic kidney disease, prostate cancer 2014 who presents via EMS from [facility] after suffering ground-level fall at [facility].  Workup on admission revealed acute intraparenchymal hemorrhage of the left frontal lobe with multiple bilateral small volume SAH.  Neurosurgery was consulted with no surgical intervention recommended.  While boarding in the ED patient had repeat CT which showed ICH revealed progressive left frontal hematoma with intraventricular extension into the fourth ventricle resulting in anterior midline shift of 5 mm, no hydrocephalus.  Neurosurgery reviewed imaging and recommended admission to the ICU under CCM for close monitoring.   2/15 presented after ground-level fall from SNF workup revealed ICH with bilateral [facility] 2/16 repeat CT while boarding in ED revealed progressive left frontal hematoma with intraventricular extension into the fourth ventricle resulting in anterior midline shift of 5 mm, no hydrocephalus.  Admission changed to ICU 2/18-CT angio, CT head with enlarging clot 2/20 - Stable left frontal hematoma with mild mass effect. 2/26: At this time bleeding has been stable for over a week.  Neurosurgery signed off. 3/1: Code status changed to DNR/DNI. Family at bed side.   Assessment & Plan:   Principal Problem:   Subarachnoid hemorrhage (HCC) Active Problems:   Intraparenchymal hemorrhage of brain (HCC)   Acute metabolic encephalopathy   History of closed left femoral fracture s/p arthroplasty (HCC)   Long-term memory impairment   Acute kidney injury superimposed on chronic kidney disease (HCC)   Essential hypertension   History of prostate cancer   Non-insulin dependent type 2 diabetes  mellitus (HCC)   Normocytic anemia   History of mood disorder   HLD (hyperlipidemia)   ICH (intracerebral hemorrhage) (HCC)   Protein-calorie malnutrition, severe  Left frontal intracerebral traumatic hematoma: Patient presented with B/L SAH sec.to trauma. Status post 7 days of prophylactic Keppra. CT head 12/16 left frontal small ICH with bilateral small SAH.  Traumatic contusion Repeat CT 2/16 showed significant and large hematoma of the left frontal area with multilobe configuration and IVH and SAH, 5 mm midline shift. CTA head and neck: 60% right ICA involved bilateral VA moderate to severe stenosis, moderate left VA origin stenosis and mild to moderate bilateral ICA; MRI with and without contrast unchanged size of large left frontal intraparenchymal hematoma with 4 mm anterior rightward midline shift.  Subarachnoid blood over both hemisphere. EEG showed left frontal sharp waves, moderate diffuse encephalopathy, no seizure. Patient was on aspirin prior to admission,  now off, No antithrombotic due to ICH. At this time bleeding has been stable for over a week.  Neurosurgery signed off. Still not been able to swallow.  Speech and swallow evaluation. Plan is for PEG tube if failed swallow evaluation.  Family is agreeable.   Cerebral edema: Repeat CT 2/17, 5 mm rightward  midline shift. CT 2/18: MLS  7/51mm MRI: 2/22:MLS 4mm Sodium improved.   AKI CKD stage IIIb: Patient creatinine peaked to 1.7 Trending down  creatinine 1.31.  Continue to monitor   Hypernatremia: Continue to monitor sodium , Improved. On free water  Aspiration Pneumonia:  Continued to spike fevers. Chest x-ray concerning for aspiration pneumonia,  started on Unasyn. Infectious disease consulted. Continue IV Unasyn  x 5 days. COVID, RSV, influenza negative.  Hypertension: Goal is normotensive. Continue Norvasc and  metoprolol.   Diabetes type 2: Sliding scale insulin, 5 units tube feeding coverage every 4  hours   History of frailty with recurrent mechanical falls: PT/OT > SNF   Constipation: Continue MiraLAX and Senokot   Nutrition:, Severe protein caloric malnutrition On tube feedings.    Goals of care discussion: Goals of care discussed in detail with spouse.  Outcome explained. Patient has poor outcome, CODE STATUS changed to DNR/DNI   See wound care documentation below     Pressure Injury 11/30/23 Hip Left Stage 2 -  Partial thickness loss of dermis presenting as a shallow open injury with a red, pink wound bed without slough. (Active)  11/30/23 2000  Location: Hip  Location Orientation: Left  Staging: Stage 2 -  Partial thickness loss of dermis presenting as a shallow open injury with a red, pink wound bed without slough.  Wound Description (Comments):   Present on Admission:   Dressing Type Foam - Lift dressing to assess site every shift 12/08/23 2000     Pressure Injury 12/01/23 Knee Left;Posterior Stage 3 -  Full thickness tissue loss. Subcutaneous fat may be visible but bone, tendon or muscle are NOT exposed. (Active)  12/01/23 2000  Location: Knee  Location Orientation: Left;Posterior  Staging: Stage 3 -  Full thickness tissue loss. Subcutaneous fat may be visible but bone, tendon or muscle are NOT exposed.  Wound Description (Comments):   Present on Admission:   Dressing Type Foam - Lift dressing to assess site every shift 12/08/23 2000        Nutrition Problem: Severe Malnutrition Etiology: chronic illness       Signs/Symptoms: severe fat depletion, severe muscle depletion, percent weight loss       Interventions: Tube feeding, MVI, Refer to RD note for recommendations   Estimated body mass index is 18.18 kg/m as calculated from the following:   Height as of this encounter: 6' (1.829 m).   Weight as of this encounter: 60.8 kg.   DVT prophylaxis: Heparin Code Status:DNR Family Communication: Wife at bed side Disposition Plan:    Status is:  Inpatient Remains inpatient appropriate because: Severity of illness.  Speech and swallow evaluation once complete antibiotics.    Consultants:  Neurology Neurosurgery  Procedures:  MRI , EEG Antimicrobials:  Anti-infectives (From admission, onward)    Start     Dose/Rate Route Frequency Ordered Stop   12/15/23 1200  Ampicillin-Sulbactam (UNASYN) 3 g in sodium chloride 0.9 % 100 mL IVPB        3 g 200 mL/hr over 30 Minutes Intravenous Every 6 hours 12/15/23 1018 12/20/23 1159   12/15/23 0845  ceFEPIme (MAXIPIME) 2 g in sodium chloride 0.9 % 100 mL IVPB  Status:  Discontinued        2 g 200 mL/hr over 30 Minutes Intravenous Every 12 hours 12/15/23 0745 12/15/23 1018   12/14/23 1515  Ampicillin-Sulbactam (UNASYN) 3 g in sodium chloride 0.9 % 100 mL IVPB  Status:  Discontinued        3 g 200 mL/hr over 30 Minutes Intravenous Every 6 hours 12/14/23 1422 12/15/23 0745      Subjective: Patient was seen and examined at bedside. Overnight events noted. Patient has cortrek tube for feeding, opens eyes on calling his name. Patient has no fever since yesterday.   Objective: Vitals:   12/15/23 2328 12/16/23 0402 12/16/23 0755 12/16/23 1117  BP: 117/62 (!) 118/57 (!) 124/58 121/66  Pulse: 91 93 95 82  Resp: 18 18  18 18  Temp: 99.1 F (37.3 C) (!) 100.4 F (38 C) 99 F (37.2 C) 98.8 F (37.1 C)  TempSrc: Oral Oral Oral Oral  SpO2: 100% 98% 100% 100%  Weight:  66.6 kg    Height:        Intake/Output Summary (Last 24 hours) at 12/16/2023 1405 Last data filed at 12/16/2023 1100 Gross per 24 hour  Intake 1474.18 ml  Output 1700 ml  Net -225.82 ml   Filed Weights   12/13/23 0456 12/14/23 0432 12/16/23 0402  Weight: 60.7 kg 62.8 kg 66.6 kg    Examination:  General exam: Appears comfortable, deconditioned, not in any acute distress. Respiratory system: CTA Bilaterally. Respiratory effort normal.  RR 15 Cardiovascular system: S1 & S2 heard, RRR. No JVD, murmurs, rubs, gallops  or clicks.  Gastrointestinal system: Abdomen is non distended, soft and non tender.  Normal bowel sounds heard. Central nervous system: Alert and oriented X 1. No focal neurological deficits. Extremities: No edema, no cyanosis, no clubbing Skin: No rashes, lesions or ulcers Psychiatry: Mood & affect appropriate.     Data Reviewed: I have personally reviewed following labs and imaging studies  CBC: Recent Labs  Lab 12/12/23 1028 12/13/23 0655 12/14/23 0704 12/15/23 1043 12/16/23 0627  WBC 11.4* 11.6* 12.7* 10.9* 10.5  NEUTROABS  --   --   --  10.1* 9.5*  HGB 7.8* 8.0* 7.6* 7.3* 7.4*  HCT 25.1* 25.3* 23.8* 22.9* 23.1*  MCV 96.2 94.4 94.1 95.4 95.9  PLT 163 167 153 153 180   Basic Metabolic Panel: Recent Labs  Lab 12/10/23 0627 12/11/23 0657 12/12/23 1028 12/13/23 0655  NA 148* 146* 143 140  K 4.2 4.3 4.3 4.2  CL 113* 109 107 104  CO2 28 27 26 25   GLUCOSE 171* 154* 142* 158*  BUN 34* 36* 41* 36*  CREATININE 1.46* 1.43* 1.40* 1.41*  CALCIUM 8.7* 8.8* 8.4* 8.2*  MG 2.3 2.4 2.5* 2.3  PHOS 2.9 3.5 3.6 3.0   GFR: Estimated Creatinine Clearance: 44 mL/min (A) (by C-G formula based on SCr of 1.41 mg/dL (H)). Liver Function Tests: Recent Labs  Lab 12/16/23 0627  AST 107*  ALT 100*  ALKPHOS 106  BILITOT 0.7  PROT 5.5*  ALBUMIN 1.9*   No results for input(s): "LIPASE", "AMYLASE" in the last 168 hours. No results for input(s): "AMMONIA" in the last 168 hours. Coagulation Profile: No results for input(s): "INR", "PROTIME" in the last 168 hours. Cardiac Enzymes: No results for input(s): "CKTOTAL", "CKMB", "CKMBINDEX", "TROPONINI" in the last 168 hours. BNP (last 3 results) No results for input(s): "PROBNP" in the last 8760 hours. HbA1C: No results for input(s): "HGBA1C" in the last 72 hours. CBG: Recent Labs  Lab 12/15/23 1942 12/15/23 2329 12/16/23 0403 12/16/23 0756 12/16/23 1119  GLUCAP 94 213* 227* 184* 171*   Lipid Profile: No results for input(s):  "CHOL", "HDL", "LDLCALC", "TRIG", "CHOLHDL", "LDLDIRECT" in the last 72 hours. Thyroid Function Tests: No results for input(s): "TSH", "T4TOTAL", "FREET4", "T3FREE", "THYROIDAB" in the last 72 hours. Anemia Panel: No results for input(s): "VITAMINB12", "FOLATE", "FERRITIN", "TIBC", "IRON", "RETICCTPCT" in the last 72 hours. Sepsis Labs: No results for input(s): "PROCALCITON", "LATICACIDVEN" in the last 168 hours.   Recent Results (from the past 240 hours)  Culture, blood (single) w Reflex to ID Panel     Status: None (Preliminary result)   Collection Time: 12/14/23  9:48 PM   Specimen: BLOOD LEFT ARM  Result Value Ref Range Status  Specimen Description BLOOD LEFT ARM  Final   Special Requests   Final    BOTTLES DRAWN AEROBIC AND ANAEROBIC Blood Culture results may not be optimal due to an inadequate volume of blood received in culture bottles   Culture   Final    NO GROWTH 2 DAYS Performed at [facility], [address] [place], [place] [address]    Report Status PENDING  Incomplete  Resp panel by RT-PCR (RSV, Flu A&B, Covid) Anterior Nasal Swab     Status: None   Collection Time: 12/15/23  8:07 AM   Specimen: Anterior Nasal Swab  Result Value Ref Range Status   SARS Coronavirus 2 by RT PCR NEGATIVE NEGATIVE Final   Influenza A by PCR NEGATIVE NEGATIVE Final   Influenza B by PCR NEGATIVE NEGATIVE Final    Comment: (NOTE) The Xpert Xpress SARS-CoV-2/FLU/RSV plus assay is intended as an aid in the diagnosis of influenza from Nasopharyngeal swab specimens and should not be used as a sole basis for treatment. Nasal washings and aspirates are unacceptable for Xpert Xpress SARS-CoV-2/FLU/RSV testing.  Fact Sheet for Patients: BloggerCourse.com  Fact Sheet for Healthcare Providers: SeriousBroker.it  This test is not yet approved or cleared by the [place] FDA and has been authorized for detection and/or diagnosis of  SARS-CoV-2 by FDA under an Emergency Use Authorization (EUA). This EUA will remain in effect (meaning this test can be used) for the duration of the COVID-19 declaration under Section 564(b)(1) of the Act, 21 U.S.C. section 360bbb-3(b)(1), unless the authorization is terminated or revoked.     Resp Syncytial Virus by PCR NEGATIVE NEGATIVE Final    Comment: (NOTE) Fact Sheet for Patients: BloggerCourse.com  Fact Sheet for Healthcare Providers: SeriousBroker.it  This test is not yet approved or cleared by the [place] FDA and has been authorized for detection and/or diagnosis of SARS-CoV-2 by FDA under an Emergency Use Authorization (EUA). This EUA will remain in effect (meaning this test can be used) for the duration of the COVID-19 declaration under Section 564(b)(1) of the Act, 21 U.S.C. section 360bbb-3(b)(1), unless the authorization is terminated or revoked.  Performed at [facility], [address], [place], [place] [address]      Radiology Studies: No results found.   Scheduled Meds:  amantadine  100 mg Per Tube BID   amLODipine  5 mg Per Tube Daily   atorvastatin  40 mg Per Tube Daily   feeding supplement (PROSource TF20)  60 mL Per Tube Daily   free water  200 mL Per Tube Q4H   heparin injection (subcutaneous)  5,000 Units Subcutaneous Q12H   insulin aspart  0-15 Units Subcutaneous Q4H   insulin aspart  5 Units Subcutaneous Q4H   metoCLOPramide  10 mg Per Tube TID AC & HS   metoprolol tartrate  50 mg Per Tube BID   multivitamin with minerals  1 tablet Per Tube Daily   mouth rinse  15 mL Mouth Rinse 4 times per day   pantoprazole (PROTONIX) IV  40 mg Intravenous Q12H   polyethylene glycol  17 g Per Tube Daily   senna  1 tablet Per Tube BID   sodium chloride flush  3 mL Intravenous Q12H   Continuous Infusions:  ampicillin-sulbactam (UNASYN) IV 3 g (12/16/23 1224)   chlorproMAZINE (THORAZINE) 12.5  mg in sodium chloride 0.9 % 25 mL IVPB 12.5 mg (12/13/23 0824)   feeding supplement (OSMOLITE 1.5 CAL) 50 mL/hr at 12/16/23 0418  LOS: 16 days    Time spent: 35 mins    Willeen Niece, MD [facility]   If 7PM-7AM, please contact night-coverage

## 2023-12-16 NOTE — Plan of Care (Signed)
 Had a question/answer session with wife concerning infection and antibiotics. All questions answered.   Problem: Health Behavior/Discharge Planning: Goal: Ability to identify and utilize available resources and services will improve Outcome: Progressing   Problem: Skin Integrity: Goal: Risk for impaired skin integrity will decrease Outcome: Progressing   Problem: Health Behavior/Discharge Planning: Goal: Ability to manage health-related needs will improve Outcome: Progressing  Problem: Nutrition: Goal: Adequate nutrition will be maintained Outcome: Progressing   Problem: Coping: Goal: Level of anxiety will decrease Outcome: Progressing   Problem: Elimination: Goal: Will not experience complications related to urinary retention Outcome: Progressing

## 2023-12-16 NOTE — TOC Progression Note (Signed)
 Transition of Care [facility]) - Progression Note    Patient Details  Name: William Todd MRN: [id number] Date of Birth: [birth year]  [facility] of [facility] CM/SW Contact  Kermit Balo, RN Phone Number: 12/16/2023, 10:27 AM  Clinical Narrative:     Awaiting palliative consult.   Expected Discharge Plan: Home w Home Health Services Barriers to Discharge: Continued Medical Work up  Expected Discharge Plan and Services In-house Referral: Clinical Social Work, Hospice / Palliative Care     Living arrangements for the past 2 months: Single Family Home, Skilled Nursing Facility                                       Social Determinants of Health (SDOH) Interventions SDOH Screenings   Food Insecurity: No Food Insecurity (11/30/2023)  Housing: Low Risk  (11/30/2023)  Transportation Needs: No Transportation Needs (11/30/2023)  Utilities: Not At Risk (11/30/2023)  Social Connections: Moderately Integrated (11/30/2023)  Tobacco Use: Low Risk  (11/30/2023)    Readmission Risk Interventions    12/08/2023   12:40 PM  Readmission Risk Prevention Plan  Transportation Screening Complete  Medication Review (RN Care Manager) Complete  PCP or Specialist appointment within 3-5 days of discharge Complete  HRI or Home Care Consult Complete  SW Recovery Care/Counseling Consult Complete  Palliative Care Screening Complete  Skilled Nursing Facility Complete

## 2023-12-17 DIAGNOSIS — Z7189 Other specified counseling: Secondary | ICD-10-CM | POA: Diagnosis not present

## 2023-12-17 DIAGNOSIS — I619 Nontraumatic intracerebral hemorrhage, unspecified: Secondary | ICD-10-CM | POA: Diagnosis not present

## 2023-12-17 DIAGNOSIS — E785 Hyperlipidemia, unspecified: Secondary | ICD-10-CM

## 2023-12-17 DIAGNOSIS — G9341 Metabolic encephalopathy: Secondary | ICD-10-CM | POA: Diagnosis not present

## 2023-12-17 DIAGNOSIS — E43 Unspecified severe protein-calorie malnutrition: Secondary | ICD-10-CM

## 2023-12-17 DIAGNOSIS — Z515 Encounter for palliative care: Secondary | ICD-10-CM | POA: Diagnosis not present

## 2023-12-17 DIAGNOSIS — N179 Acute kidney failure, unspecified: Secondary | ICD-10-CM | POA: Diagnosis not present

## 2023-12-17 DIAGNOSIS — I609 Nontraumatic subarachnoid hemorrhage, unspecified: Secondary | ICD-10-CM | POA: Diagnosis not present

## 2023-12-17 LAB — CBC WITH DIFFERENTIAL/PLATELET
Abs Immature Granulocytes: 0.08 10*3/uL — ABNORMAL HIGH (ref 0.00–0.07)
Basophils Absolute: 0 10*3/uL (ref 0.0–0.1)
Basophils Relative: 0 %
Eosinophils Absolute: 0.1 10*3/uL (ref 0.0–0.5)
Eosinophils Relative: 1 %
HCT: 22.6 % — ABNORMAL LOW (ref 39.0–52.0)
Hemoglobin: 7 g/dL — ABNORMAL LOW (ref 13.0–17.0)
Immature Granulocytes: 1 %
Lymphocytes Relative: 5 %
Lymphs Abs: 0.5 10*3/uL — ABNORMAL LOW (ref 0.7–4.0)
MCH: 30.3 pg (ref 26.0–34.0)
MCHC: 31 g/dL (ref 30.0–36.0)
MCV: 97.8 fL (ref 80.0–100.0)
Monocytes Absolute: 0.5 10*3/uL (ref 0.1–1.0)
Monocytes Relative: 5 %
Neutro Abs: 8.6 10*3/uL — ABNORMAL HIGH (ref 1.7–7.7)
Neutrophils Relative %: 88 %
Platelets: 187 10*3/uL (ref 150–400)
RBC: 2.31 MIL/uL — ABNORMAL LOW (ref 4.22–5.81)
RDW: 17.3 % — ABNORMAL HIGH (ref 11.5–15.5)
WBC: 9.7 10*3/uL (ref 4.0–10.5)
nRBC: 0 % (ref 0.0–0.2)

## 2023-12-17 LAB — BASIC METABOLIC PANEL
Anion gap: 7 (ref 5–15)
BUN: 62 mg/dL — ABNORMAL HIGH (ref 8–23)
CO2: 25 mmol/L (ref 22–32)
Calcium: 7.8 mg/dL — ABNORMAL LOW (ref 8.9–10.3)
Chloride: 114 mmol/L — ABNORMAL HIGH (ref 98–111)
Creatinine, Ser: 1.92 mg/dL — ABNORMAL HIGH (ref 0.61–1.24)
GFR, Estimated: 36 mL/min — ABNORMAL LOW (ref >60)
Glucose, Bld: 211 mg/dL — ABNORMAL HIGH (ref 70–99)
Potassium: 4.1 mmol/L (ref 3.5–5.1)
Sodium: 146 mmol/L — ABNORMAL HIGH (ref 135–145)

## 2023-12-17 LAB — GLUCOSE, CAPILLARY
Glucose-Capillary: 179 mg/dL — ABNORMAL HIGH (ref 70–99)
Glucose-Capillary: 179 mg/dL — ABNORMAL HIGH (ref 70–99)
Glucose-Capillary: 213 mg/dL — ABNORMAL HIGH (ref 70–99)
Glucose-Capillary: 257 mg/dL — ABNORMAL HIGH (ref 70–99)
Glucose-Capillary: 239 mg/dL — ABNORMAL HIGH (ref 70–99)
Glucose-Capillary: 276 mg/dL — ABNORMAL HIGH (ref 70–99)

## 2023-12-17 LAB — PHOSPHORUS: Phosphorus: 2.5 mg/dL (ref 2.5–4.6)

## 2023-12-17 LAB — HEPATIC FUNCTION PANEL
ALT: 143 U/L — ABNORMAL HIGH (ref 0–44)
AST: 132 U/L — ABNORMAL HIGH (ref 15–41)
Albumin: 1.8 g/dL — ABNORMAL LOW (ref 3.5–5.0)
Alkaline Phosphatase: 121 U/L (ref 38–126)
Bilirubin, Direct: 0.2 mg/dL (ref 0.0–0.2)
Indirect Bilirubin: 0.4 mg/dL (ref 0.3–0.9)
Total Bilirubin: 0.6 mg/dL (ref 0.0–1.2)
Total Protein: 5.4 g/dL — ABNORMAL LOW (ref 6.5–8.1)

## 2023-12-17 LAB — MAGNESIUM: Magnesium: 3 mg/dL — ABNORMAL HIGH (ref 1.7–2.4)

## 2023-12-17 MED ORDER — IPRATROPIUM BROMIDE 0.02 % IN SOLN
0.5000 mg | Freq: Four times a day (QID) | RESPIRATORY_TRACT | Status: DC
  Administered 2023-12-18 – 2023-12-20 (×9): 0.5 mg via RESPIRATORY_TRACT
  Filled 2023-12-17 (×9): qty 2.5

## 2023-12-17 MED ORDER — DEXTROSE 5 % IV SOLN: INTRAVENOUS | Status: AC

## 2023-12-17 MED ORDER — INSULIN ASPART 100 UNIT/ML IJ SOLN
8.0000 [IU] | INTRAMUSCULAR | Status: DC
  Administered 2023-12-18 – 2023-12-22 (×23): 8 [IU] via SUBCUTANEOUS

## 2023-12-17 MED ORDER — POLYETHYLENE GLYCOL 3350 17 G PO PACK: 17.0000 g | PACK | Freq: Every day | ORAL | Status: DC | PRN

## 2023-12-17 MED ORDER — LEVALBUTEROL HCL 0.63 MG/3ML IN NEBU
0.6300 mg | INHALATION_SOLUTION | Freq: Four times a day (QID) | RESPIRATORY_TRACT | Status: DC
  Administered 2023-12-18 – 2023-12-20 (×9): 0.63 mg via RESPIRATORY_TRACT
  Filled 2023-12-17 (×9): qty 3

## 2023-12-17 MED ORDER — SENNA 8.6 MG PO TABS: 1.0000 | ORAL_TABLET | Freq: Every evening | ORAL | Status: DC | PRN

## 2023-12-17 MED ORDER — INSULIN GLARGINE 100 UNIT/ML ~~LOC~~ SOLN
10.0000 [IU] | Freq: Every day | SUBCUTANEOUS | Status: AC
Start: 2023-12-17 — End: ?
  Administered 2023-12-17 – 2023-12-22 (×6): 10 [IU] via SUBCUTANEOUS
  Filled 2023-12-17 (×7): qty 0.1

## 2023-12-17 NOTE — Plan of Care (Addendum)
 Has been more interactive today.  Eyes open more but has been pushing caregivers away and swatting at staff when care given.    Problem: Coping: Goal: Level of anxiety will decrease Outcome: Progressing   Problem: Coping: Goal: Ability to adjust to condition or change in health will improve Outcome: Progressing    Problem: Skin Integrity: Goal: Risk for impaired skin integrity will decrease Outcome: Progressing   Problem: Tissue Perfusion: Goal: Adequacy of tissue perfusion will improve Outcome: Progressing   Problem: Clinical Measurements: Goal: Will remain free from infection Outcome: Progressing   Problem: Activity: Goal: Risk for activity intolerance will decrease Outcome: Progressing   Problem: Nutrition: Goal: Adequate nutrition will be maintained Outcome: Progressing

## 2023-12-17 NOTE — Progress Notes (Signed)
 Palliative:  HPI: 73 y.o. male  with past medical history of CAD, HTN, HLD, diabetes, prostate cancer (s/p radiation therapy), cognitive impairment, fall with left hip fracture s/p surgical intervention 11/16/23 admitted on 11/29/2023 from SNF rehab with fall with acute left frontal lobe large intraparenchymal hemorrhage (likely traumatic) with bilateral small SAH. Progression 2/17 with intraparenchymal hematoma in left frontal lobe with mass effect and midline shift. NPO status with concerns for ongoing aspiration. Unable to participate in therapies due to declined mental state and unable to participate.    I discussed with RN. RN reports diarrhea today so changing Miralax and senokot to PRN for now. I met today with William Todd and wife at bedside. I attempted to engage with wife. She reports that she has no questions and no concerns. We review that [name] does seem more alert today. He does look over in my direction and makes eye contact but does not track. Reported that he followed some commands. William Todd reports that she spoke with a doctor earlier. She is hopeful for treatment of pneumonia, recovery of infection, PEG placement, and pursuing rehab. I discussed that we can certainly get therapies back involved if he is more alert and able to interact and work with them. Continue to take one day at a time.   Exam: Alert, makes eye contact - more interactive today than yesterday. No distress. Not wet today. Breathing regular, unlabored. Not verbal to me today. Resting comfortably.   Plan: - DNR/DNI already in place - Time for outcomes - Wife expresses desire for PEG placement  25 min  Yong Channel, NP Palliative Medicine Team Pager [phone] (Please see [email or web address] for schedule) Team Phone [phone]

## 2023-12-17 NOTE — Progress Notes (Signed)
 PROGRESS NOTE    William Todd  ZOX:[id number] DOB: 09-08-1951 DOA: 11/29/2023 PCP: Knox Royalty, MD   Brief Narrative:  This 73 year old Male with past medical history significant for hypertension, recurrents mechanical fall, protein caloric malnutrition, diabetes type 2, chronic kidney disease, prostate cancer 2014 who presents via EMS from [facility] after suffering ground-level fall at [facility].  Workup on admission revealed acute intraparenchymal hemorrhage of the left frontal lobe with multiple bilateral small volume SAH.  Neurosurgery was consulted with no surgical intervention recommended.  While boarding in the ED patient had repeat CT which showed ICH revealed progressive left frontal hematoma with intraventricular extension into the fourth ventricle resulting in anterior midline shift of 5 mm, no hydrocephalus.  Neurosurgery reviewed imaging and recommended admission to the ICU under CCM for close monitoring.   2/15 presented after ground-level fall from SNF workup revealed ICH with bilateral [facility] 2/16 repeat CT while boarding in ED revealed progressive left frontal hematoma with intraventricular extension into the fourth ventricle resulting in anterior midline shift of 5 mm, no hydrocephalus.  Admission changed to ICU 2/18-CT angio, CT head with enlarging clot 2/20 - Stable left frontal hematoma with mild mass effect. 2/26: At this time bleeding has been stable for over a week.  Neurosurgery signed off. 3/1: Code status changed to DNR/DNI. Family at bed side.  Spiked a Temp and likely from Aspiration. On IV Unasyn now. Palliative Consulted and having GOC Discussions.   Assessment and Plan:  Left frontal intracerebral traumatic hematoma: Patient presented with B/L SAH sec.to trauma. Status post 7 days of prophylactic Keppra. CT head 12/16 left frontal small ICH with bilateral small SAH. Traumatic contusion. Repeat CT 2/16 showed significant and large hematoma of the left frontal area  with multilobe configuration and IVH and SAH, 5 mm midline shift. CTA head and neck: 60% right ICA involved bilateral VA moderate to severe stenosis, moderate left VA origin stenosis and mild to moderate bilateral ICA; MRI with and without contrast unchanged size of large left frontal intraparenchymal hematoma with 4 mm anterior rightward midline shift.  Subarachnoid blood over both hemisphere. EEG showed left frontal sharp waves, moderate diffuse encephalopathy, no seizure. Patient was on aspirin prior to admission,  now off, No antithrombotic due to ICH. At this time bleeding has been stable for over a week.  Neurosurgery signed off. Still not been able to swallow.  Speech and swallow evaluation. Plan is for PEG tube if failed swallow evaluation.  Family is agreeable and undergoing Palliative Care discussions   Cerebral Edema: Repeat CT 2/17, 5 mm rightward  midline shift. CT 2/18: MLS  7/63mm MRI: 2/22:MLS 4mm. Sodium improved.    Aspiration Pneumonia:  Continued to spike fevers. Chest x-ray concerning for aspiration pneumonia,  started on Unasyn. Infectious disease consulted. Continue IV Unasyn  x 5 days. COVID, RSV, influenza negative. C/w Pulmonary Toileting and add Xopenex/Atrovent. Repeat CXR in the AM   Hypertension: Goal is normotensive. Continue Amlodipine 5 mg po Daily and Metoprolol Tartrate 50 mg per Tube BID.CTM BP per protocol. Last BP reading was 121/57   Diabetes Mellitus Type 2: C/w Moderate Novolog SSI AC, Insulin Glargine 10 units sq Daily, and 5 units tube feeding coverage every 4 hours increased to 8 units. CBGs ranging from 179-257   History of frailty with recurrent mechanical falls: PT/OT > SNF  HyperNa+: Mild Na+ was 146. Started D5W @ 75 mL/hr and FW Flushes of 200 mL q4h. CTM and Trend and repeat CMP in  the AM  AKI on CKD Stage 3a: BUN/Cr worsened and is now 62/1.92. Start D5W @ 75 mL/hr. Avoid Nephrotoxic Medications, Contrast Dyes, Hypotension and Dehydration to Ensure  Adequate Renal Perfusion and will need to Renally Adjust Meds. CTM and Trend Renal Function carefully and repeat CMP in the AM   Abnormal LFTs: Worsening. AST is now 132 and ALT is now 143.  Normocytic Anemia: Trending down. H/H now 7.0/22.6. Check Anemia Panel in the AM. CTM for S/Sx of Bleeding. May need pRBC transfusion. Repeat CBC in the AM  GERD/GI Prophylaxis: C/w IV PPI with Pantoprazole 40 mg q12h  Hypoalbuminemia: Albumin Level Dropping and now 1.8. CTM and Trend and repeat CMP in the AM  Pressure Injury, poA Pressure Injury 11/30/23 Hip Left Stage 2 -  Partial thickness loss of dermis presenting as a shallow open injury with a red, pink wound bed without slough. (Active)  11/30/23 2000  Location: Hip  Location Orientation: Left  Staging: Stage 2 -  Partial thickness loss of dermis presenting as a shallow open injury with a red, pink wound bed without slough.  Wound Description (Comments):   Present on Admission:      Pressure Injury 12/01/23 Knee Left;Posterior Stage 3 -  Full thickness tissue loss. Subcutaneous fat may be visible but bone, tendon or muscle are NOT exposed. (Active)  12/01/23 2000  Location: Knee  Location Orientation: Left;Posterior  Staging: Stage 3 -  Full thickness tissue loss. Subcutaneous fat may be visible but bone, tendon or muscle are NOT exposed.  Wound Description (Comments):   Present on Admission:      Pressure Injury 12/14/23 Foot Left;Lateral Unstageable - Full thickness tissue loss in which the base of the injury is covered by slough (yellow, tan, gray, green or brown) and/or eschar (tan, brown or black) in the wound bed. left lateral foot (Active)  12/14/23 0730  Location: Foot  Location Orientation: Left;Lateral (Left Lateral Foot)  Staging: Unstageable - Full thickness tissue loss in which the base of the injury is covered by slough (yellow, tan, gray, green or brown) and/or eschar (tan, brown or black) in the wound bed.  Wound Description  (Comments): left lateral foot  Present on Admission:    Severe Malnutrition in the context of Chronic illness: Nutrition Problem: Severe Malnutrition Etiology: chronic illness Signs/Symptoms: severe fat depletion, severe muscle depletion, percent weight loss Interventions: Tube feeding, MVI, Refer to RD note for recommendations  DVT prophylaxis: heparin injection 5,000 Units Start: 12/06/23 1000 SCDs Start: 11/30/23 0334 Place TED hose Start: 11/30/23 0334    Code Status: Limited: Do not attempt resuscitation (DNR) -DNR-LIMITED -Do Not Intubate/DNI  Family Communication: No family present at bedside  Disposition Plan:  Level of care: Progressive Status is: Inpatient Remains inpatient appropriate because: Needs further clinical improvement and clearance and further GOC discussion   Consultants:  Neurosurgery Neurology ID Palliative Care Medicine  Procedures:  As delineated as above  Antimicrobials:  Anti-infectives (From admission, onward)    Start     Dose/Rate Route Frequency Ordered Stop   12/15/23 1200  Ampicillin-Sulbactam (UNASYN) 3 g in sodium chloride 0.9 % 100 mL IVPB        3 g 200 mL/hr over 30 Minutes Intravenous Every 6 hours 12/15/23 1018 12/20/23 1159   12/15/23 0845  ceFEPIme (MAXIPIME) 2 g in sodium chloride 0.9 % 100 mL IVPB  Status:  Discontinued        2 g 200 mL/hr over 30 Minutes Intravenous Every 12  hours 12/15/23 0745 12/15/23 1018   12/14/23 1515  Ampicillin-Sulbactam (UNASYN) 3 g in sodium chloride 0.9 % 100 mL IVPB  Status:  Discontinued        3 g 200 mL/hr over 30 Minutes Intravenous Every 6 hours 12/14/23 1422 12/15/23 0745       Subjective: Seen and examined at bedside and not really able to interact with me today.  Continued to have declining mental state and does not really follow commands.  No nausea or vomiting and appears very withdrawn and aphasic.  Objective: Vitals:   12/17/23 0728 12/17/23 0728 12/17/23 1110 12/17/23 1630  BP:   127/69 117/61 (!) 121/57  Pulse:  97 83 98  Resp:  18 18 19   Temp: 99.8 F (37.7 C) 99.8 F (37.7 C) 98.9 F (37.2 C) 98.4 F (36.9 C)  TempSrc: Axillary Oral Oral Oral  SpO2:  100% 99% 98%  Weight:      Height:        Intake/Output Summary (Last 24 hours) at 12/17/2023 2007 Last data filed at 12/17/2023 0800 Gross per 24 hour  Intake 896.67 ml  Output 1100 ml  Net -203.33 ml   Filed Weights   12/13/23 0456 12/14/23 0432 12/16/23 0402  Weight: 60.7 kg 62.8 kg 66.6 kg   Examination: Physical Exam:  Constitutional: Thin chronically ill-appearing African-American male who appears withdrawn Respiratory: Diminished to auscultation bilaterally with some coarse breath sounds and does have some rhonchi but no appreciable rales or crackles. Normal respiratory effort and patient is not tachypenic. No accessory muscle use.  Unlabored breathing Cardiovascular: RRR, no murmurs / rubs / gallops. S1 and S2 auscultated. No extremity edema..  Abdomen: Soft, non-tender, nondistended.  Bowel sounds positive.  GU: Deferred. Musculoskeletal: No clubbing / cyanosis of digits/nails. No joint deformity upper and lower extremities.  Skin: No rashes, lesions, ulcers on a limited skin evaluation. No induration; Warm and dry.  Neurologic: Does not follow commands and appears withdrawn and aphasic Psychiatric: Unable to assess given his current condition  Data Reviewed: I have personally reviewed following labs and imaging studies  CBC: Recent Labs  Lab 12/13/23 0655 12/14/23 0704 12/15/23 1043 12/16/23 0627 12/17/23 0644  WBC 11.6* 12.7* 10.9* 10.5 9.7  NEUTROABS  --   --  10.1* 9.5* 8.6*  HGB 8.0* 7.6* 7.3* 7.4* 7.0*  HCT 25.3* 23.8* 22.9* 23.1* 22.6*  MCV 94.4 94.1 95.4 95.9 97.8  PLT 167 153 153 180 187   Basic Metabolic Panel: Recent Labs  Lab 12/11/23 0657 12/12/23 1028 12/13/23 0655 12/17/23 0644  NA 146* 143 140 146*  K 4.3 4.3 4.2 4.1  CL 109 107 104 114*  CO2 27 26 25 25    GLUCOSE 154* 142* 158* 211*  BUN 36* 41* 36* 62*  CREATININE 1.43* 1.40* 1.41* 1.92*  CALCIUM 8.8* 8.4* 8.2* 7.8*  MG 2.4 2.5* 2.3 3.0*  PHOS 3.5 3.6 3.0 2.5   GFR: Estimated Creatinine Clearance: 32.3 mL/min (A) (by C-G formula based on SCr of 1.92 mg/dL (H)). Liver Function Tests: Recent Labs  Lab 12/16/23 0627 12/17/23 0644  AST 107* 132*  ALT 100* 143*  ALKPHOS 106 121  BILITOT 0.7 0.6  PROT 5.5* 5.4*  ALBUMIN 1.9* 1.8*   No results for input(s): "LIPASE", "AMYLASE" in the last 168 hours. No results for input(s): "AMMONIA" in the last 168 hours. Coagulation Profile: No results for input(s): "INR", "PROTIME" in the last 168 hours. Cardiac Enzymes: No results for input(s): "CKTOTAL", "CKMB", "CKMBINDEX", "TROPONINI"  in the last 168 hours. BNP (last 3 results) No results for input(s): "PROBNP" in the last 8760 hours. HbA1C: No results for input(s): "HGBA1C" in the last 72 hours. CBG: Recent Labs  Lab 12/16/23 2339 12/17/23 0345 12/17/23 0730 12/17/23 1113 12/17/23 1626  GLUCAP 203* 179* 179* 213* 257*   Lipid Profile: No results for input(s): "CHOL", "HDL", "LDLCALC", "TRIG", "CHOLHDL", "LDLDIRECT" in the last 72 hours. Thyroid Function Tests: No results for input(s): "TSH", "T4TOTAL", "FREET4", "T3FREE", "THYROIDAB" in the last 72 hours. Anemia Panel: No results for input(s): "VITAMINB12", "FOLATE", "FERRITIN", "TIBC", "IRON", "RETICCTPCT" in the last 72 hours. Sepsis Labs: No results for input(s): "PROCALCITON", "LATICACIDVEN" in the last 168 hours.  Recent Results (from the past 240 hours)  Culture, blood (single) w Reflex to ID Panel     Status: None (Preliminary result)   Collection Time: 12/14/23  9:48 PM   Specimen: BLOOD LEFT ARM  Result Value Ref Range Status   Specimen Description BLOOD LEFT ARM  Final   Special Requests   Final    BOTTLES DRAWN AEROBIC AND ANAEROBIC Blood Culture results may not be optimal due to an inadequate volume of blood  received in culture bottles   Culture   Final    NO GROWTH 3 DAYS Performed at [facility], [address], [place], [place] [address]    Report Status PENDING  Incomplete  Resp panel by RT-PCR (RSV, Flu A&B, Covid) Anterior Nasal Swab     Status: None   Collection Time: 12/15/23  8:07 AM   Specimen: Anterior Nasal Swab  Result Value Ref Range Status   SARS Coronavirus 2 by RT PCR NEGATIVE NEGATIVE Final   Influenza A by PCR NEGATIVE NEGATIVE Final   Influenza B by PCR NEGATIVE NEGATIVE Final    Comment: (NOTE) The Xpert Xpress SARS-CoV-2/FLU/RSV plus assay is intended as an aid in the diagnosis of influenza from Nasopharyngeal swab specimens and should not be used as a sole basis for treatment. Nasal washings and aspirates are unacceptable for Xpert Xpress SARS-CoV-2/FLU/RSV testing.  Fact Sheet for Patients: BloggerCourse.com  Fact Sheet for Healthcare Providers: SeriousBroker.it  This test is not yet approved or cleared by the [place] FDA and has been authorized for detection and/or diagnosis of SARS-CoV-2 by FDA under an Emergency Use Authorization (EUA). This EUA will remain in effect (meaning this test can be used) for the duration of the COVID-19 declaration under Section 564(b)(1) of the Act, 21 U.S.C. section 360bbb-3(b)(1), unless the authorization is terminated or revoked.     Resp Syncytial Virus by PCR NEGATIVE NEGATIVE Final    Comment: (NOTE) Fact Sheet for Patients: BloggerCourse.com  Fact Sheet for Healthcare Providers: SeriousBroker.it  This test is not yet approved or cleared by the [place] FDA and has been authorized for detection and/or diagnosis of SARS-CoV-2 by FDA under an Emergency Use Authorization (EUA). This EUA will remain in effect (meaning this test can be used) for the duration of the COVID-19 declaration under Section  564(b)(1) of the Act, 21 U.S.C. section 360bbb-3(b)(1), unless the authorization is terminated or revoked.  Performed at [facility], [address], [place], [place] [address]     Radiology Studies: No results found.  Scheduled Meds:  amantadine  100 mg Per Tube BID   amLODipine  5 mg Per Tube Daily   atorvastatin  40 mg Per Tube Daily   feeding supplement (PROSource TF20)  60 mL Per Tube Daily   free  water  200 mL Per Tube Q4H   heparin injection (subcutaneous)  5,000 Units Subcutaneous Q12H   insulin aspart  0-15 Units Subcutaneous Q4H   [START ON 12/18/2023] insulin aspart  8 Units Subcutaneous Q4H   insulin glargine  10 Units Subcutaneous Daily   ipratropium  0.5 mg Nebulization Q6H   levalbuterol  0.63 mg Nebulization Q6H   metoCLOPramide  10 mg Per Tube TID AC & HS   metoprolol tartrate  50 mg Per Tube BID   multivitamin with minerals  1 tablet Per Tube Daily   mouth rinse  15 mL Mouth Rinse 4 times per day   pantoprazole (PROTONIX) IV  40 mg Intravenous Q12H   sodium chloride flush  3 mL Intravenous Q12H   Continuous Infusions:  ampicillin-sulbactam (UNASYN) IV 3 g (12/17/23 1816)   chlorproMAZINE (THORAZINE) 12.5 mg in sodium chloride 0.9 % 25 mL IVPB 12.5 mg (12/13/23 0824)   dextrose 75 mL/hr at 12/17/23 1023   feeding supplement (OSMOLITE 1.5 CAL) 50 mL/hr at 12/17/23 0414    LOS: 17 days   Marguerita Merles, DO [facility] Available via Epic secure chat 7am-7pm After these hours, please refer to coverage provider listed on [email or web address] 12/17/2023, 8:07 PM

## 2023-12-17 NOTE — Inpatient Diabetes Management (Signed)
 Inpatient Diabetes Program Recommendations  AACE/ADA: New Consensus Statement on Inpatient Glycemic Control (2015)  Target Ranges:  Prepandial:   less than 140 mg/dL      Peak postprandial:   less than 180 mg/dL (1-2 hours)      Critically ill patients:  140 - 180 mg/dL   Lab Results  Component Value Date   GLUCAP 179 (H) 12/17/2023   HGBA1C 6.1 (H) 12/02/2023    Review of Glycemic Control  Latest Reference Range & Units 12/16/23 07:56 12/16/23 11:19 12/16/23 16:44 12/16/23 19:44 12/16/23 23:39 12/17/23 03:45 12/17/23 07:30  Glucose-Capillary 70 - 99 mg/dL 130 (H)  Novolog 8 units 171 (H)  Novolog 8 units 263 (H)  Novolog 13 units 232 (H)  Novolog 10 units 203 (H)  Novolog 10 units 179 (H)  Novolog 8 units 179 (H)  Novolog 8 units   Diabetes history: DM 2 Outpatient Diabetes medications: Januvia 50 mg Daily Current orders for Inpatient glycemic control:  Novolog 0-15 units Q4 hours Novolog 5 units Q4 hours Tube Feed coverage A1c 6.1 on 2/18 Osmolite 50 ml/hour  Inpatient Diabetes Program Recommendations:    -   may consider increasing Tube Feed Coverage to 8 units Q4 hours  Thanks,  Christena Deem RN, MSN, BC-ADM Inpatient Diabetes Coordinator Team Pager [phone] (8a-5p)

## 2023-12-17 NOTE — Hospital Course (Addendum)
 This 73 year old Male with past medical history significant for hypertension, recurrents mechanical fall, protein caloric malnutrition, diabetes type 2, chronic kidney disease, prostate cancer 2014 who presents via EMS from [facility] after suffering ground-level fall at [facility].  Workup on admission revealed acute intraparenchymal hemorrhage of the left frontal lobe with multiple bilateral small volume SAH.  Neurosurgery was consulted with no surgical intervention recommended.  While boarding in the ED patient had repeat CT which showed ICH revealed progressive left frontal hematoma with intraventricular extension into the fourth ventricle resulting in anterior midline shift of 5 mm, no hydrocephalus.  Neurosurgery reviewed imaging and recommended admission to the ICU under CCM for close monitoring.   2/15 presented after ground-level fall from SNF workup revealed ICH with bilateral [facility] 2/16 repeat CT while boarding in ED revealed progressive left frontal hematoma with intraventricular extension into the fourth ventricle resulting in anterior midline shift of 5 mm, no hydrocephalus.  Admission changed to ICU 2/18-CT angio, CT head with enlarging clot 2/20 - Stable left frontal hematoma with mild mass effect. 2/26: At this time bleeding has been stable for over a week.  Neurosurgery signed off. 3/1: Code status changed to DNR/DNI. Family at bed side.  Spiked a Temp and likely from Aspiration and now improved ands s/p IV Unasyn now. Palliative Consulted and having GOC Discussions. Will reconsult SLP and have IR evaluate for PEG given that he is likely not to pass his Swallow Test.  SLP reevaluated given his increased alertness but they feel that he is continuing to be unsafe for p.o. intake and recommending continuing to follow given his high risk of aspiration.  Assessment and Plan:  Left Frontal Intracerebral Traumatic Hematoma: Patient presented with B/L SAH sec.to trauma. Status post 7 days of  prophylactic Keppra. CT head 12/16 left frontal small ICH with bilateral small SAH. Traumatic contusion. Repeat CT 2/16 showed significant and large hematoma of the left frontal area with multilobe configuration and IVH and SAH, 5 mm midline shift. CTA head and neck: 60% right ICA involved bilateral VA moderate to severe stenosis, moderate left VA origin stenosis and mild to moderate bilateral ICA; MRI with and without contrast unchanged size of large left frontal intraparenchymal hematoma with 4 mm anterior rightward midline shift.  Subarachnoid blood over both hemisphere. EEG showed left frontal sharp waves, moderate diffuse encephalopathy, no seizure. Patient was on aspirin prior to admission,  now off, No antithrombotic due to ICH. At this time bleeding has been stable for over a week.  Neurosurgery signed off. Still not been able to swallow. SLP signed off but will re-consult. Plan is for PEG tube now as doubt he will be able to participate in SLP evaluation.  Family is agreeable and undergoing Palliative Care discussions. IR consulted for PEG tube placement and they have ordered a CT Abd/Pelvis w/o Contrast to evaluate anatomy for PEG placement and this was equivocal for percutaneous gastrostomy and the recommendation from the radiologist was a trial of contrast opacification of the colon and insufflation recommended. Discussed with Dr. Milford Cage on 12/19/23 who states the IR APP will evaluate this further and Order has been placed for IR Gastrostomy Tube with Moderate Sedation and pending to be done.    Cerebral Edema: Repeat CT 2/17, 5 mm rightward  midline shift. CT 2/18: MLS  7/6mm MRI: 2/22:MLS 4mm. Sodium improved.    Aspiration Pneumonia:  Continued to spike fevers but now improving. Chest x-ray concerning for aspiration pneumonia, Started on Unasyn. Infectious disease consulted.  Continued IV Unasyn x 5 days. COVID, RSV, influenza negative. C/w Pulmonary Toileting and add Xopenex/Atrovent. WBC now  9.1. Repeat CXR in the AM   Hypertension: Goal is normotensive. Continue Amlodipine 5 mg po Daily and Metoprolol Tartrate 50 mg per Tube BID.CTM BP per protocol. Last BP reading was 129/67   Diabetes Mellitus Type 2: C/w Moderate Novolog SSI AC, Insulin Glargine 10 units sq Daily, and 5 units tube feeding coverage every 4 hours increased to 8 units. CBGs ranging from 145-247 the last 5 checks   History of frailty with recurrent mechanical falls: PT/OT > SNF  HyperNa+: Mild and improved Na+ is now 144. D5W @ 75 mL/hr now stopped.C/w  FW Flushes of 200 mL q4h for now. CTM and Trend and repeat CMP in the AM  AKI on CKD Stage 3a: BUN/Cr had worsened and peaked at 62/1.92. He is s/p infusion of D5W @ 75 mL/hr and BUN/Cr has improved to 25/1.27. Avoid Nephrotoxic Medications, Contrast Dyes (if possible), Hypotension and Dehydration to Ensure Adequate Renal Perfusion and will need to Renally Adjust Meds. CTM and Trend Renal Function carefully and repeat CMP in the AM   Abnormal LFTs: Slowly improving now. AST has gone from 132 -> 100 -> 75  -> 73 and ALT has gone from 143 -> 132 -> 121 -> 114.  Continue to monitor and trend and repeat CMP in a.m. and if necessary will obtain right upper quadrant ultrasound but they are slowly trending down  Normocytic Anemia: Was Trending down. H/H now slowly improving and is 7.6/24.7 on last check. Check Anemia Panel in the AM. CTM for S/Sx of Bleeding. May need pRBC transfusion. Repeat CBC in the AM  GERD/GI Prophylaxis: C/w IV PPI with Pantoprazole 40 mg q12h  Hypoalbuminemia: Albumin Level is again 1.8. CTM and Trend and repeat CMP in the AM  Pressure Injury, poA Pressure Injury 11/30/23 Hip Left Stage 2 -  Partial thickness loss of dermis presenting as a shallow open injury with a red, pink wound bed without slough. (Active)  11/30/23 2000  Location: Hip  Location Orientation: Left  Staging: Stage 2 -  Partial thickness loss of dermis presenting as a shallow  open injury with a red, pink wound bed without slough.  Wound Description (Comments):   Present on Admission:      Pressure Injury 12/01/23 Knee Left;Posterior Stage 3 -  Full thickness tissue loss. Subcutaneous fat may be visible but bone, tendon or muscle are NOT exposed. (Active)  12/01/23 2000  Location: Knee  Location Orientation: Left;Posterior  Staging: Stage 3 -  Full thickness tissue loss. Subcutaneous fat may be visible but bone, tendon or muscle are NOT exposed.  Wound Description (Comments):   Present on Admission:      Pressure Injury 12/14/23 Foot Left;Lateral Unstageable - Full thickness tissue loss in which the base of the injury is covered by slough (yellow, tan, gray, green or brown) and/or eschar (tan, brown or black) in the wound bed. left lateral foot (Active)  12/14/23 0730  Location: Foot  Location Orientation: Left;Lateral (Left Lateral Foot)  Staging: Unstageable - Full thickness tissue loss in which the base of the injury is covered by slough (yellow, tan, gray, green or brown) and/or eschar (tan, brown or black) in the wound bed.  Wound Description (Comments): left lateral foot  Present on Admission:      Pressure Injury 12/19/23 Sacrum Bilateral Stage 2 -  Partial thickness loss of dermis presenting as a shallow open  injury with a red, pink wound bed without slough. (Active)  12/19/23 0800  Location: Sacrum  Location Orientation: Bilateral  Staging: Stage 2 -  Partial thickness loss of dermis presenting as a shallow open injury with a red, pink wound bed without slough.  Wound Description (Comments):   Present on Admission: No   Severe Malnutrition in the context of Chronic illness: Nutrition Problem: Severe Malnutrition Etiology: chronic illness Signs/Symptoms: severe fat depletion, severe muscle depletion, percent weight loss Interventions: Tube feeding, MVI, Refer to RD note for recommendations

## 2023-12-18 ENCOUNTER — Inpatient Hospital Stay (HOSPITAL_COMMUNITY)

## 2023-12-18 DIAGNOSIS — N179 Acute kidney failure, unspecified: Secondary | ICD-10-CM | POA: Diagnosis not present

## 2023-12-18 DIAGNOSIS — I619 Nontraumatic intracerebral hemorrhage, unspecified: Secondary | ICD-10-CM | POA: Diagnosis not present

## 2023-12-18 DIAGNOSIS — I609 Nontraumatic subarachnoid hemorrhage, unspecified: Secondary | ICD-10-CM | POA: Diagnosis not present

## 2023-12-18 DIAGNOSIS — G9341 Metabolic encephalopathy: Secondary | ICD-10-CM | POA: Diagnosis not present

## 2023-12-18 LAB — COMPREHENSIVE METABOLIC PANEL
ALT: 132 U/L — ABNORMAL HIGH (ref 0–44)
AST: 100 U/L — ABNORMAL HIGH (ref 15–41)
Albumin: 1.8 g/dL — ABNORMAL LOW (ref 3.5–5.0)
Alkaline Phosphatase: 122 U/L (ref 38–126)
Anion gap: 3 — ABNORMAL LOW (ref 5–15)
BUN: 45 mg/dL — ABNORMAL HIGH (ref 8–23)
CO2: 24 mmol/L (ref 22–32)
Calcium: 7.1 mg/dL — ABNORMAL LOW (ref 8.9–10.3)
Chloride: 115 mmol/L — ABNORMAL HIGH (ref 98–111)
Creatinine, Ser: 1.61 mg/dL — ABNORMAL HIGH (ref 0.61–1.24)
GFR, Estimated: 45 mL/min — ABNORMAL LOW (ref >60)
Glucose, Bld: 92 mg/dL (ref 70–99)
Potassium: 3.5 mmol/L (ref 3.5–5.1)
Sodium: 142 mmol/L (ref 135–145)
Total Bilirubin: 0.4 mg/dL (ref 0.0–1.2)
Total Protein: 5.2 g/dL — ABNORMAL LOW (ref 6.5–8.1)

## 2023-12-18 LAB — BLOOD GAS, ARTERIAL
Acid-Base Excess: 5.9 mmol/L — ABNORMAL HIGH (ref 0.0–2.0)
Bicarbonate: 29.7 mmol/L — ABNORMAL HIGH (ref 20.0–28.0)
O2 Saturation: 99.5 %
Patient temperature: 37.1
pCO2 arterial: 39 mmHg (ref 32–48)
pH, Arterial: 7.49 — ABNORMAL HIGH (ref 7.35–7.45)
pO2, Arterial: 88 mmHg (ref 83–108)

## 2023-12-18 LAB — GLUCOSE, CAPILLARY
Glucose-Capillary: 145 mg/dL — ABNORMAL HIGH (ref 70–99)
Glucose-Capillary: 133 mg/dL — ABNORMAL HIGH (ref 70–99)
Glucose-Capillary: 215 mg/dL — ABNORMAL HIGH (ref 70–99)
Glucose-Capillary: 78 mg/dL (ref 70–99)
Glucose-Capillary: 216 mg/dL — ABNORMAL HIGH (ref 70–99)
Glucose-Capillary: 206 mg/dL — ABNORMAL HIGH (ref 70–99)

## 2023-12-18 LAB — CBC WITH DIFFERENTIAL/PLATELET
Abs Immature Granulocytes: 0.07 10*3/uL (ref 0.00–0.07)
Basophils Absolute: 0 10*3/uL (ref 0.0–0.1)
Basophils Relative: 0 %
Eosinophils Absolute: 0.1 10*3/uL (ref 0.0–0.5)
Eosinophils Relative: 1 %
HCT: 22.4 % — ABNORMAL LOW (ref 39.0–52.0)
Hemoglobin: 7 g/dL — ABNORMAL LOW (ref 13.0–17.0)
Immature Granulocytes: 1 %
Lymphocytes Relative: 7 %
Lymphs Abs: 0.6 10*3/uL — ABNORMAL LOW (ref 0.7–4.0)
MCH: 30.6 pg (ref 26.0–34.0)
MCHC: 31.3 g/dL (ref 30.0–36.0)
MCV: 97.8 fL (ref 80.0–100.0)
Monocytes Absolute: 0.5 10*3/uL (ref 0.1–1.0)
Monocytes Relative: 6 %
Neutro Abs: 7.5 10*3/uL (ref 1.7–7.7)
Neutrophils Relative %: 85 %
Platelets: 188 10*3/uL (ref 150–400)
RBC: 2.29 MIL/uL — ABNORMAL LOW (ref 4.22–5.81)
RDW: 16.7 % — ABNORMAL HIGH (ref 11.5–15.5)
WBC: 8.7 10*3/uL (ref 4.0–10.5)
nRBC: 0 % (ref 0.0–0.2)

## 2023-12-18 LAB — PHOSPHORUS: Phosphorus: 2.5 mg/dL (ref 2.5–4.6)

## 2023-12-18 LAB — MAGNESIUM: Magnesium: 2.6 mg/dL — ABNORMAL HIGH (ref 1.7–2.4)

## 2023-12-18 NOTE — Progress Notes (Signed)
 PROGRESS NOTE    William Todd  ZOX:[id number] DOB: June 20, 1951 DOA: 11/29/2023 PCP: Knox Royalty, MD   Brief Narrative:  This 73 year old Male with past medical history significant for hypertension, recurrents mechanical fall, protein caloric malnutrition, diabetes type 2, chronic kidney disease, prostate cancer 2014 who presents via EMS from [facility] after suffering ground-level fall at [facility].  Workup on admission revealed acute intraparenchymal hemorrhage of the left frontal lobe with multiple bilateral small volume SAH.  Neurosurgery was consulted with no surgical intervention recommended.  While boarding in the ED patient had repeat CT which showed ICH revealed progressive left frontal hematoma with intraventricular extension into the fourth ventricle resulting in anterior midline shift of 5 mm, no hydrocephalus.  Neurosurgery reviewed imaging and recommended admission to the ICU under CCM for close monitoring.   2/15 presented after ground-level fall from SNF workup revealed ICH with bilateral [facility] 2/16 repeat CT while boarding in ED revealed progressive left frontal hematoma with intraventricular extension into the fourth ventricle resulting in anterior midline shift of 5 mm, no hydrocephalus.  Admission changed to ICU 2/18-CT angio, CT head with enlarging clot 2/20 - Stable left frontal hematoma with mild mass effect. 2/26: At this time bleeding has been stable for over a week.  Neurosurgery signed off. 3/1: Code status changed to DNR/DNI. Family at bed side.  Spiked a Temp and likely from Aspiration. On IV Unasyn now. Palliative Consulted and having GOC Discussions. Will reconsult SLP and have IR evaluate for PEG given that he is likely not to pass his Swallow Test.   Assessment and Plan:  Left Frontal Intracerebral Traumatic Hematoma: Patient presented with B/L SAH sec.to trauma. Status post 7 days of prophylactic Keppra. CT head 12/16 left frontal small ICH with bilateral small  SAH. Traumatic contusion. Repeat CT 2/16 showed significant and large hematoma of the left frontal area with multilobe configuration and IVH and SAH, 5 mm midline shift. CTA head and neck: 60% right ICA involved bilateral VA moderate to severe stenosis, moderate left VA origin stenosis and mild to moderate bilateral ICA; MRI with and without contrast unchanged size of large left frontal intraparenchymal hematoma with 4 mm anterior rightward midline shift.  Subarachnoid blood over both hemisphere. EEG showed left frontal sharp waves, moderate diffuse encephalopathy, no seizure. Patient was on aspirin prior to admission,  now off, No antithrombotic due to ICH. At this time bleeding has been stable for over a week.  Neurosurgery signed off. Still not been able to swallow. SLP signed off but will re-consult. Plan is for PEG tube now as doubt he will be able to participate in SLP evaluation.  Family is agreeable and undergoing Palliative Care discussions. IR consulted for PEG tube placement and they have ordered a CT Abd/Pelvis w/o Contrast to evaluate anatomy for PEG placement.    Cerebral Edema: Repeat CT 2/17, 5 mm rightward  midline shift. CT 2/18: MLS  7/16mm MRI: 2/22:MLS 4mm. Sodium improved.    Aspiration Pneumonia:  Continued to spike fevers but now improving. Chest x-ray concerning for aspiration pneumonia, Started on Unasyn. Infectious disease consulted. Continue IV Unasyn  x 5 days. COVID, RSV, influenza negative. C/w Pulmonary Toileting and add Xopenex/Atrovent. WBC now 8.7. Repeat CXR in the AM   Hypertension: Goal is normotensive. Continue Amlodipine 5 mg po Daily and Metoprolol Tartrate 50 mg per Tube BID.CTM BP per protocol. Last BP reading was 123/68   Diabetes Mellitus Type 2: C/w Moderate Novolog SSI AC, Insulin Glargine 10 units  sq Daily, and 5 units tube feeding coverage every 4 hours increased to 8 units. CBGs ranging from 133-276   History of frailty with recurrent mechanical  falls: PT/OT > SNF  HyperNa+: Mild and improved Na+ was 146 and is now 142 this AM. C/w D5W @ 75 mL/hr and FW Flushes of 200 mL q4h for now. CTM and Trend and repeat CMP in the AM  AKI on CKD Stage 3a: BUN/Cr had worsened and peaked at 62/1.92. Since initiation of  D5W @ 75 mL/hr BUN/Cr has improved to 45/1.61. Avoid Nephrotoxic Medications, Contrast Dyes, Hypotension and Dehydration to Ensure Adequate Renal Perfusion and will need to Renally Adjust Meds. CTM and Trend Renal Function carefully and repeat CMP in the AM   Abnormal LFTs: Slowly improving now. AST has gone from 132 -> 100 and ALT has gone from 143 -> 132.  Normocytic Anemia: Trending down. H/H now 7.0/22.4. Check Anemia Panel in the AM. CTM for S/Sx of Bleeding. May need pRBC transfusion. Repeat CBC in the AM  GERD/GI Prophylaxis: C/w IV PPI with Pantoprazole 40 mg q12h  Hypoalbuminemia: Albumin Level is again 1.8. CTM and Trend and repeat CMP in the AM  Pressure Injury, poA Pressure Injury 11/30/23 Hip Left Stage 2 -  Partial thickness loss of dermis presenting as a shallow open injury with a red, pink wound bed without slough. (Active)  11/30/23 2000  Location: Hip  Location Orientation: Left  Staging: Stage 2 -  Partial thickness loss of dermis presenting as a shallow open injury with a red, pink wound bed without slough.  Wound Description (Comments):   Present on Admission:      Pressure Injury 12/01/23 Knee Left;Posterior Stage 3 -  Full thickness tissue loss. Subcutaneous fat may be visible but bone, tendon or muscle are NOT exposed. (Active)  12/01/23 2000  Location: Knee  Location Orientation: Left;Posterior  Staging: Stage 3 -  Full thickness tissue loss. Subcutaneous fat may be visible but bone, tendon or muscle are NOT exposed.  Wound Description (Comments):   Present on Admission:      Pressure Injury 12/14/23 Foot Left;Lateral Unstageable - Full thickness tissue loss in which the base of the injury is  covered by slough (yellow, tan, gray, green or brown) and/or eschar (tan, brown or black) in the wound bed. left lateral foot (Active)  12/14/23 0730  Location: Foot  Location Orientation: Left;Lateral (Left Lateral Foot)  Staging: Unstageable - Full thickness tissue loss in which the base of the injury is covered by slough (yellow, tan, gray, green or brown) and/or eschar (tan, brown or black) in the wound bed.  Wound Description (Comments): left lateral foot  Present on Admission:    Severe Malnutrition in the context of Chronic illness: Nutrition Problem: Severe Malnutrition Etiology: chronic illness Signs/Symptoms: severe fat depletion, severe muscle depletion, percent weight loss Interventions: Tube feeding, MVI, Refer to RD note for recommendations   DVT prophylaxis: heparin injection 5,000 Units Start: 12/06/23 1000 SCDs Start: 11/30/23 0334 Place TED hose Start: 11/30/23 0334    Code Status: Limited: Do not attempt resuscitation (DNR) -DNR-LIMITED -Do Not Intubate/DNI  Family Communication: Discussed with the wife at bedside  Disposition Plan:  Level of care: Progressive Status is: Inpatient Remains inpatient appropriate because: Needs further clinical improvement and clearance and further GOC discussion; Now will likely need a PEG   Consultants:  Neurosurgery Neurology ID Palliative Care Medicine IR  Procedures:  As delineated as above  Antimicrobials:  Anti-infectives (From admission,  onward)    Start     Dose/Rate Route Frequency Ordered Stop   12/15/23 1200  Ampicillin-Sulbactam (UNASYN) 3 g in sodium chloride 0.9 % 100 mL IVPB        3 g 200 mL/hr over 30 Minutes Intravenous Every 6 hours 12/15/23 1018 12/20/23 1159   12/15/23 0845  ceFEPIme (MAXIPIME) 2 g in sodium chloride 0.9 % 100 mL IVPB  Status:  Discontinued        2 g 200 mL/hr over 30 Minutes Intravenous Every 12 hours 12/15/23 0745 12/15/23 1018   12/14/23 1515  Ampicillin-Sulbactam (UNASYN) 3 g in  sodium chloride 0.9 % 100 mL IVPB  Status:  Discontinued        3 g 200 mL/hr over 30 Minutes Intravenous Every 6 hours 12/14/23 1422 12/15/23 0745       Subjective: Seen and examined at bedside hemoglobin more interactive today and would look at me but I spoke with him and attempt to smile.  Does not really follow commands though.  No nausea or vomiting.  Continues to be aphasic.  Will have SLP reevaluate but in the interim we will also consult IR for possible PEG tube.  Discussed with the patient's wife about his likely poor prognosis and she still wants to pursue PEG.  No other concerns or complaints at this time.  Objective: Vitals:   12/18/23 0500 12/18/23 0810 12/18/23 0817 12/18/23 1126  BP:  131/66  123/68  Pulse:  92  87  Resp:  17  17  Temp:  98.7 F (37.1 C)  98.2 F (36.8 C)  TempSrc:  Oral  Oral  SpO2:  98% 96% 99%  Weight: 67.6 kg     Height:        Intake/Output Summary (Last 24 hours) at 12/18/2023 1425 Last data filed at 12/18/2023 1300 Gross per 24 hour  Intake 490 ml  Output 2150 ml  Net -1660 ml   Filed Weights   12/14/23 0432 12/16/23 0402 12/18/23 0500  Weight: 62.8 kg 66.6 kg 67.6 kg   Examination: Physical Exam:  Constitutional: Thin chronically ill-appearing African-American male who appears more interactive today and will look into an open his eyes when you speak to him and attempt to smile but does not follow commands. Respiratory: Diminished to auscultation bilaterally, no wheezing, rales, rhonchi or crackles but does have some sonorous breath sounds. Normal respiratory effort and patient is not tachypenic. No accessory muscle use.  Unlabored breathing. Cardiovascular: RRR, no murmurs / rubs / gallops. S1 and S2 auscultated. No extremity edema.  Abdomen: Soft, non-tender, non-distended. Bowel sounds positive.  GU: Deferred. Musculoskeletal: No clubbing / cyanosis of digits/nails. No joint deformity upper and lower extremities. Skin: No rashes,  lesions, ulcers on limited skin evaluation. No induration; Warm and dry.  Neurologic: Opens his eyes and does look at you when you speak to him and attempt to smile but does not follow any commands Psychiatric: Appears calm Data Reviewed: I have personally reviewed following labs and imaging studies  CBC: Recent Labs  Lab 12/14/23 0704 12/15/23 1043 12/16/23 0627 12/17/23 0644 12/18/23 0700  WBC 12.7* 10.9* 10.5 9.7 8.7  NEUTROABS  --  10.1* 9.5* 8.6* 7.5  HGB 7.6* 7.3* 7.4* 7.0* 7.0*  HCT 23.8* 22.9* 23.1* 22.6* 22.4*  MCV 94.1 95.4 95.9 97.8 97.8  PLT 153 153 180 187 188   Basic Metabolic Panel: Recent Labs  Lab 12/12/23 1028 12/13/23 0655 12/17/23 0644 12/18/23 0700  NA 143 140  146* 142  K 4.3 4.2 4.1 3.5  CL 107 104 114* 115*  CO2 26 25 25 24   GLUCOSE 142* 158* 211* 92  BUN 41* 36* 62* 45*  CREATININE 1.40* 1.41* 1.92* 1.61*  CALCIUM 8.4* 8.2* 7.8* 7.1*  MG 2.5* 2.3 3.0* 2.6*  PHOS 3.6 3.0 2.5 2.5   GFR: Estimated Creatinine Clearance: 39.1 mL/min (A) (by C-G formula based on SCr of 1.61 mg/dL (H)). Liver Function Tests: Recent Labs  Lab 12/16/23 0627 12/17/23 0644 12/18/23 0700  AST 107* 132* 100*  ALT 100* 143* 132*  ALKPHOS 106 121 122  BILITOT 0.7 0.6 0.4  PROT 5.5* 5.4* 5.2*  ALBUMIN 1.9* 1.8* 1.8*   No results for input(s): "LIPASE", "AMYLASE" in the last 168 hours. No results for input(s): "AMMONIA" in the last 168 hours. Coagulation Profile: No results for input(s): "INR", "PROTIME" in the last 168 hours. Cardiac Enzymes: No results for input(s): "CKTOTAL", "CKMB", "CKMBINDEX", "TROPONINI" in the last 168 hours. BNP (last 3 results) No results for input(s): "PROBNP" in the last 8760 hours. HbA1C: No results for input(s): "HGBA1C" in the last 72 hours. CBG: Recent Labs  Lab 12/17/23 2035 12/17/23 2317 12/18/23 0359 12/18/23 0811 12/18/23 1126  GLUCAP 239* 276* 145* 133* 215*   Lipid Profile: No results for input(s): "CHOL", "HDL",  "LDLCALC", "TRIG", "CHOLHDL", "LDLDIRECT" in the last 72 hours. Thyroid Function Tests: No results for input(s): "TSH", "T4TOTAL", "FREET4", "T3FREE", "THYROIDAB" in the last 72 hours. Anemia Panel: No results for input(s): "VITAMINB12", "FOLATE", "FERRITIN", "TIBC", "IRON", "RETICCTPCT" in the last 72 hours. Sepsis Labs: No results for input(s): "PROCALCITON", "LATICACIDVEN" in the last 168 hours.  Recent Results (from the past 240 hours)  Culture, blood (single) w Reflex to ID Panel     Status: None (Preliminary result)   Collection Time: 12/14/23  9:48 PM   Specimen: BLOOD LEFT ARM  Result Value Ref Range Status   Specimen Description BLOOD LEFT ARM  Final   Special Requests   Final    BOTTLES DRAWN AEROBIC AND ANAEROBIC Blood Culture results may not be optimal due to an inadequate volume of blood received in culture bottles   Culture   Final    NO GROWTH 4 DAYS Performed at [facility], [address] [place], [place] [address]    Report Status PENDING  Incomplete  Resp panel by RT-PCR (RSV, Flu A&B, Covid) Anterior Nasal Swab     Status: None   Collection Time: 12/15/23  8:07 AM   Specimen: Anterior Nasal Swab  Result Value Ref Range Status   SARS Coronavirus 2 by RT PCR NEGATIVE NEGATIVE Final   Influenza A by PCR NEGATIVE NEGATIVE Final   Influenza B by PCR NEGATIVE NEGATIVE Final    Comment: (NOTE) The Xpert Xpress SARS-CoV-2/FLU/RSV plus assay is intended as an aid in the diagnosis of influenza from Nasopharyngeal swab specimens and should not be used as a sole basis for treatment. Nasal washings and aspirates are unacceptable for Xpert Xpress SARS-CoV-2/FLU/RSV testing.  Fact Sheet for Patients: BloggerCourse.com  Fact Sheet for Healthcare Providers: SeriousBroker.it  This test is not yet approved or cleared by the [place] FDA and has been authorized for detection and/or diagnosis of SARS-CoV-2 by FDA  under an Emergency Use Authorization (EUA). This EUA will remain in effect (meaning this test can be used) for the duration of the COVID-19 declaration under Section 564(b)(1) of the Act, 21 U.S.C. section 360bbb-3(b)(1), unless the authorization is terminated or revoked.  Resp Syncytial Virus by PCR NEGATIVE NEGATIVE Final    Comment: (NOTE) Fact Sheet for Patients: BloggerCourse.com  Fact Sheet for Healthcare Providers: SeriousBroker.it  This test is not yet approved or cleared by the [place] FDA and has been authorized for detection and/or diagnosis of SARS-CoV-2 by FDA under an Emergency Use Authorization (EUA). This EUA will remain in effect (meaning this test can be used) for the duration of the COVID-19 declaration under Section 564(b)(1) of the Act, 21 U.S.C. section 360bbb-3(b)(1), unless the authorization is terminated or revoked.  Performed at [facility], [address], [place], [place] [address]     Radiology Studies: No results found.  Scheduled Meds:  amantadine  100 mg Per Tube BID   amLODipine  5 mg Per Tube Daily   atorvastatin  40 mg Per Tube Daily   feeding supplement (PROSource TF20)  60 mL Per Tube Daily   free water  200 mL Per Tube Q4H   heparin injection (subcutaneous)  5,000 Units Subcutaneous Q12H   insulin aspart  0-15 Units Subcutaneous Q4H   insulin aspart  8 Units Subcutaneous Q4H   insulin glargine  10 Units Subcutaneous Daily   ipratropium  0.5 mg Nebulization Q6H   levalbuterol  0.63 mg Nebulization Q6H   metoCLOPramide  10 mg Per Tube TID AC & HS   metoprolol tartrate  50 mg Per Tube BID   multivitamin with minerals  1 tablet Per Tube Daily   mouth rinse  15 mL Mouth Rinse 4 times per day   pantoprazole (PROTONIX) IV  40 mg Intravenous Q12H   sodium chloride flush  3 mL Intravenous Q12H   Continuous Infusions:  ampicillin-sulbactam (UNASYN) IV 3 g (12/18/23 1301)    chlorproMAZINE (THORAZINE) 12.5 mg in sodium chloride 0.9 % 25 mL IVPB 12.5 mg (12/13/23 0824)   feeding supplement (OSMOLITE 1.5 CAL) 50 mL/hr at 12/17/23 0414    LOS: 18 days   Marguerita Merles, DO [facility] Available via Epic secure chat 7am-7pm After these hours, please refer to coverage provider listed on [email or web address] 12/18/2023, 2:25 PM

## 2023-12-19 DIAGNOSIS — I619 Nontraumatic intracerebral hemorrhage, unspecified: Secondary | ICD-10-CM | POA: Diagnosis not present

## 2023-12-19 DIAGNOSIS — I609 Nontraumatic subarachnoid hemorrhage, unspecified: Secondary | ICD-10-CM | POA: Diagnosis not present

## 2023-12-19 DIAGNOSIS — G9341 Metabolic encephalopathy: Secondary | ICD-10-CM | POA: Diagnosis not present

## 2023-12-19 DIAGNOSIS — N179 Acute kidney failure, unspecified: Secondary | ICD-10-CM | POA: Diagnosis not present

## 2023-12-19 LAB — CULTURE, BLOOD (SINGLE): Culture: NO GROWTH

## 2023-12-19 LAB — COMPREHENSIVE METABOLIC PANEL
ALT: 121 U/L — ABNORMAL HIGH (ref 0–44)
AST: 75 U/L — ABNORMAL HIGH (ref 15–41)
Albumin: 1.8 g/dL — ABNORMAL LOW (ref 3.5–5.0)
Alkaline Phosphatase: 112 U/L (ref 38–126)
Anion gap: 4 — ABNORMAL LOW (ref 5–15)
BUN: 34 mg/dL — ABNORMAL HIGH (ref 8–23)
CO2: 26 mmol/L (ref 22–32)
Calcium: 7.7 mg/dL — ABNORMAL LOW (ref 8.9–10.3)
Chloride: 113 mmol/L — ABNORMAL HIGH (ref 98–111)
Creatinine, Ser: 1.48 mg/dL — ABNORMAL HIGH (ref 0.61–1.24)
GFR, Estimated: 50 mL/min — ABNORMAL LOW (ref >60)
Glucose, Bld: 189 mg/dL — ABNORMAL HIGH (ref 70–99)
Potassium: 3.9 mmol/L (ref 3.5–5.1)
Sodium: 143 mmol/L (ref 135–145)
Total Bilirubin: 0.4 mg/dL (ref 0.0–1.2)
Total Protein: 5.1 g/dL — ABNORMAL LOW (ref 6.5–8.1)

## 2023-12-19 LAB — CBC WITH DIFFERENTIAL/PLATELET
Abs Immature Granulocytes: 0.13 10*3/uL — ABNORMAL HIGH (ref 0.00–0.07)
Basophils Absolute: 0 10*3/uL (ref 0.0–0.1)
Basophils Relative: 0 %
Eosinophils Absolute: 0.1 10*3/uL (ref 0.0–0.5)
Eosinophils Relative: 1 %
HCT: 24.4 % — ABNORMAL LOW (ref 39.0–52.0)
Hemoglobin: 7.3 g/dL — ABNORMAL LOW (ref 13.0–17.0)
Immature Granulocytes: 1 %
Lymphocytes Relative: 6 %
Lymphs Abs: 0.5 10*3/uL — ABNORMAL LOW (ref 0.7–4.0)
MCH: 29.3 pg (ref 26.0–34.0)
MCHC: 29.9 g/dL — ABNORMAL LOW (ref 30.0–36.0)
MCV: 98 fL (ref 80.0–100.0)
Monocytes Absolute: 0.5 10*3/uL (ref 0.1–1.0)
Monocytes Relative: 6 %
Neutro Abs: 7.8 10*3/uL — ABNORMAL HIGH (ref 1.7–7.7)
Neutrophils Relative %: 86 %
Platelets: 199 10*3/uL (ref 150–400)
RBC: 2.49 MIL/uL — ABNORMAL LOW (ref 4.22–5.81)
RDW: 16.6 % — ABNORMAL HIGH (ref 11.5–15.5)
WBC: 9.1 10*3/uL (ref 4.0–10.5)
nRBC: 0 % (ref 0.0–0.2)

## 2023-12-19 LAB — GLUCOSE, CAPILLARY
Glucose-Capillary: 145 mg/dL — ABNORMAL HIGH (ref 70–99)
Glucose-Capillary: 180 mg/dL — ABNORMAL HIGH (ref 70–99)
Glucose-Capillary: 182 mg/dL — ABNORMAL HIGH (ref 70–99)
Glucose-Capillary: 185 mg/dL — ABNORMAL HIGH (ref 70–99)
Glucose-Capillary: 186 mg/dL — ABNORMAL HIGH (ref 70–99)
Glucose-Capillary: 192 mg/dL — ABNORMAL HIGH (ref 70–99)

## 2023-12-19 LAB — PHOSPHORUS: Phosphorus: 3 mg/dL (ref 2.5–4.6)

## 2023-12-19 LAB — MAGNESIUM: Magnesium: 2.4 mg/dL (ref 1.7–2.4)

## 2023-12-19 NOTE — Progress Notes (Signed)
 PROGRESS NOTE    William Todd  ZOX:[id number] DOB: 09-03-1951 DOA: 11/29/2023 PCP: Knox Royalty, MD   Brief Narrative:  This 73 year old Male with past medical history significant for hypertension, recurrents mechanical fall, protein caloric malnutrition, diabetes type 2, chronic kidney disease, prostate cancer 2014 who presents via EMS from [facility] after suffering ground-level fall at [facility].  Workup on admission revealed acute intraparenchymal hemorrhage of the left frontal lobe with multiple bilateral small volume SAH.  Neurosurgery was consulted with no surgical intervention recommended.  While boarding in the ED patient had repeat CT which showed ICH revealed progressive left frontal hematoma with intraventricular extension into the fourth ventricle resulting in anterior midline shift of 5 mm, no hydrocephalus.  Neurosurgery reviewed imaging and recommended admission to the ICU under CCM for close monitoring.   2/15 presented after ground-level fall from SNF workup revealed ICH with bilateral [facility] 2/16 repeat CT while boarding in ED revealed progressive left frontal hematoma with intraventricular extension into the fourth ventricle resulting in anterior midline shift of 5 mm, no hydrocephalus.  Admission changed to ICU 2/18-CT angio, CT head with enlarging clot 2/20 - Stable left frontal hematoma with mild mass effect. 2/26: At this time bleeding has been stable for over a week.  Neurosurgery signed off. 3/1: Code status changed to DNR/DNI. Family at bed side.  Spiked a Temp and likely from Aspiration. On IV Unasyn now. Palliative Consulted and having GOC Discussions. Will reconsult SLP and have IR evaluate for PEG given that he is likely not to pass his Swallow Test.  SLP reevaluated given his increased alertness but they feel that he is continuing to be unsafe for p.o. intake and recommending continuing to follow given his high risk of aspiration.  Assessment and Plan:  Left Frontal  Intracerebral Traumatic Hematoma: Patient presented with B/L SAH sec.to trauma. Status post 7 days of prophylactic Keppra. CT head 12/16 left frontal small ICH with bilateral small SAH. Traumatic contusion. Repeat CT 2/16 showed significant and large hematoma of the left frontal area with multilobe configuration and IVH and SAH, 5 mm midline shift. CTA head and neck: 60% right ICA involved bilateral VA moderate to severe stenosis, moderate left VA origin stenosis and mild to moderate bilateral ICA; MRI with and without contrast unchanged size of large left frontal intraparenchymal hematoma with 4 mm anterior rightward midline shift.  Subarachnoid blood over both hemisphere. EEG showed left frontal sharp waves, moderate diffuse encephalopathy, no seizure. Patient was on aspirin prior to admission,  now off, No antithrombotic due to ICH. At this time bleeding has been stable for over a week.  Neurosurgery signed off. Still not been able to swallow. SLP signed off but will re-consult. Plan is for PEG tube now as doubt he will be able to participate in SLP evaluation.  Family is agreeable and undergoing Palliative Care discussions. IR consulted for PEG tube placement and they have ordered a CT Abd/Pelvis w/o Contrast to evaluate anatomy for PEG placement and this was equivocal for percutaneous gastrostomy and the recommendation from the radiologist was a trial of contrast opacification of the colon and insufflation recommended. Discussed with Dr. Milford Cage who states the IR APP will evaluate this further.    Cerebral Edema: Repeat CT 2/17, 5 mm rightward  midline shift. CT 2/18: MLS  7/78mm MRI: 2/22:MLS 4mm. Sodium improved.    Aspiration Pneumonia:  Continued to spike fevers but now improving. Chest x-ray concerning for aspiration pneumonia, Started on Unasyn. Infectious disease consulted.  Continued IV Unasyn x 5 days. COVID, RSV, influenza negative. C/w Pulmonary Toileting and add Xopenex/Atrovent. WBC now 9.1.  Repeat CXR in the AM   Hypertension: Goal is normotensive. Continue Amlodipine 5 mg po Daily and Metoprolol Tartrate 50 mg per Tube BID.CTM BP per protocol. Last BP reading was 124/67   Diabetes Mellitus Type 2: C/w Moderate Novolog SSI AC, Insulin Glargine 10 units sq Daily, and 5 units tube feeding coverage every 4 hours increased to 8 units. CBGs ranging from 78-206 the last 5 checks   History of frailty with recurrent mechanical falls: PT/OT > SNF  HyperNa+: Mild and improved Na+ was 146 and is now 142 this AM. C/w D5W @ 75 mL/hr and FW Flushes of 200 mL q4h for now. CTM and Trend and repeat CMP in the AM  AKI on CKD Stage 3a: BUN/Cr had worsened and peaked at 62/1.92. Since initiation of  D5W @ 75 mL/hr BUN/Cr has improved to 34/1.48. Avoid Nephrotoxic Medications, Contrast Dyes (if possible), Hypotension and Dehydration to Ensure Adequate Renal Perfusion and will need to Renally Adjust Meds. CTM and Trend Renal Function carefully and repeat CMP in the AM   Abnormal LFTs: Slowly improving now. AST has gone from 132 -> 100 -> 75 and ALT has gone from 143 -> 132 -> 121.  Continue to monitor and trend and repeat CMP in a.m. and if necessary will obtain right upper quadrant ultrasound  Normocytic Anemia: Was Trending down. H/H now 7.3/24.4. Check Anemia Panel in the AM. CTM for S/Sx of Bleeding. May need pRBC transfusion. Repeat CBC in the AM  GERD/GI Prophylaxis: C/w IV PPI with Pantoprazole 40 mg q12h  Hypoalbuminemia: Albumin Level is again 1.8. CTM and Trend and repeat CMP in the AM  Pressure Injury, poA Pressure Injury 11/30/23 Hip Left Stage 2 -  Partial thickness loss of dermis presenting as a shallow open injury with a red, pink wound bed without slough. (Active)  11/30/23 2000  Location: Hip  Location Orientation: Left  Staging: Stage 2 -  Partial thickness loss of dermis presenting as a shallow open injury with a red, pink wound bed without slough.  Wound Description (Comments):    Present on Admission:      Pressure Injury 12/01/23 Knee Left;Posterior Stage 3 -  Full thickness tissue loss. Subcutaneous fat may be visible but bone, tendon or muscle are NOT exposed. (Active)  12/01/23 2000  Location: Knee  Location Orientation: Left;Posterior  Staging: Stage 3 -  Full thickness tissue loss. Subcutaneous fat may be visible but bone, tendon or muscle are NOT exposed.  Wound Description (Comments):   Present on Admission:      Pressure Injury 12/14/23 Foot Left;Lateral Unstageable - Full thickness tissue loss in which the base of the injury is covered by slough (yellow, tan, gray, green or brown) and/or eschar (tan, brown or black) in the wound bed. left lateral foot (Active)  12/14/23 0730  Location: Foot  Location Orientation: Left;Lateral (Left Lateral Foot)  Staging: Unstageable - Full thickness tissue loss in which the base of the injury is covered by slough (yellow, tan, gray, green or brown) and/or eschar (tan, brown or black) in the wound bed.  Wound Description (Comments): left lateral foot  Present on Admission:      Pressure Injury 12/19/23 Sacrum Bilateral Stage 2 -  Partial thickness loss of dermis presenting as a shallow open injury with a red, pink wound bed without slough. (Active)  12/19/23 0800  Location: Sacrum  Location Orientation: Bilateral  Staging: Stage 2 -  Partial thickness loss of dermis presenting as a shallow open injury with a red, pink wound bed without slough.  Wound Description (Comments):   Present on Admission: No   Severe Malnutrition in the context of Chronic illness: Nutrition Problem: Severe Malnutrition Etiology: chronic illness Signs/Symptoms: severe fat depletion, severe muscle depletion, percent weight loss Interventions: Tube feeding, MVI, Refer to RD note for recommendations   DVT prophylaxis: heparin injection 5,000 Units Start: 12/06/23 1000 SCDs Start: 11/30/23 0334 Place TED hose Start: 11/30/23 0334    Code  Status: Limited: Do not attempt resuscitation (DNR) -DNR-LIMITED -Do Not Intubate/DNI  Family Communication: Discussed with wife at bedside  Disposition Plan:  Level of care: Progressive Status is: Inpatient Remains inpatient appropriate because: Need to enteral access if PEG is a reasonable option and will need to continue monitor to see if his diet can be advanced   Consultants:  Neurosurgery Neurology ID Palliative Care Medicine Interventional Radiology  Procedures:  As delineated as above  Antimicrobials:  Anti-infectives (From admission, onward)    Start     Dose/Rate Route Frequency Ordered Stop   12/15/23 1200  Ampicillin-Sulbactam (UNASYN) 3 g in sodium chloride 0.9 % 100 mL IVPB        3 g 200 mL/hr over 30 Minutes Intravenous Every 6 hours 12/15/23 1018 12/20/23 1159   12/15/23 0845  ceFEPIme (MAXIPIME) 2 g in sodium chloride 0.9 % 100 mL IVPB  Status:  Discontinued        2 g 200 mL/hr over 30 Minutes Intravenous Every 12 hours 12/15/23 0745 12/15/23 1018   12/14/23 1515  Ampicillin-Sulbactam (UNASYN) 3 g in sodium chloride 0.9 % 100 mL IVPB  Status:  Discontinued        3 g 200 mL/hr over 30 Minutes Intravenous Every 6 hours 12/14/23 1422 12/15/23 0745       Subjective: Seen and examined at bedside and was little bit more interactive today compared to yesterday.  Would look my way when spoken to.  Still does not follow commands.  Wife states that he has been doing okay today.  No other concerns or complaints at this time.  Objective: Vitals:   12/19/23 0746 12/19/23 0839 12/19/23 1139 12/19/23 1552  BP: 124/67  115/73 130/66  Pulse: 90 77 81 88  Resp: 17 18 16 17   Temp: 98 F (36.7 C)  98.3 F (36.8 C) 98.6 F (37 C)  TempSrc: Oral  Oral Axillary  SpO2: 98% 99% 100% 98%  Weight:      Height:        Intake/Output Summary (Last 24 hours) at 12/19/2023 1748 Last data filed at 12/19/2023 1700 Gross per 24 hour  Intake 1451.89 ml  Output 1237 ml  Net  214.89 ml   Filed Weights   12/16/23 0402 12/18/23 0500 12/19/23 0500  Weight: 66.6 kg 67.6 kg 64.6 kg   Examination: Physical Exam:  Constitutional: Thin chronically ill-appearing African-American male who continues to be with the more interactive and does open his eyes when I speak to him and attempt to smile but does not follow commands Respiratory: Diminished to auscultation bilaterally, no wheezing, rales, rhonchi or crackles. Normal respiratory effort and patient is not tachypenic. No accessory muscle use.  Unlabored breathing Cardiovascular: RRR, no murmurs / rubs / gallops. S1 and S2 auscultated. No extremity edema.   Abdomen: Soft, non-tender, non-distended. Bowel sounds positive.  GU: Deferred. Musculoskeletal: No clubbing / cyanosis of  digits/nails. No joint deformity upper and lower extremities.  Skin: No rashes, lesions, ulcers on a limited. No induration; Warm and dry.  Neurologic: Opens his eyes and does look at you when you speak to him and attempt to smile but does not follow commands Psychiatric: Appears calm  Data Reviewed: I have personally reviewed following labs and imaging studies  CBC: Recent Labs  Lab 12/15/23 1043 12/16/23 0627 12/17/23 0644 12/18/23 0700 12/19/23 0711  WBC 10.9* 10.5 9.7 8.7 9.1  NEUTROABS 10.1* 9.5* 8.6* 7.5 7.8*  HGB 7.3* 7.4* 7.0* 7.0* 7.3*  HCT 22.9* 23.1* 22.6* 22.4* 24.4*  MCV 95.4 95.9 97.8 97.8 98.0  PLT 153 180 187 188 199   Basic Metabolic Panel: Recent Labs  Lab 12/13/23 0655 12/17/23 0644 12/18/23 0700 12/19/23 0711  NA 140 146* 142 143  K 4.2 4.1 3.5 3.9  CL 104 114* 115* 113*  CO2 25 25 24 26   GLUCOSE 158* 211* 92 189*  BUN 36* 62* 45* 34*  CREATININE 1.41* 1.92* 1.61* 1.48*  CALCIUM 8.2* 7.8* 7.1* 7.7*  MG 2.3 3.0* 2.6* 2.4  PHOS 3.0 2.5 2.5 3.0   GFR: Estimated Creatinine Clearance: 40.6 mL/min (A) (by C-G formula based on SCr of 1.48 mg/dL (H)). Liver Function Tests: Recent Labs  Lab 12/16/23 0627  12/17/23 0644 12/18/23 0700 12/19/23 0711  AST 107* 132* 100* 75*  ALT 100* 143* 132* 121*  ALKPHOS 106 121 122 112  BILITOT 0.7 0.6 0.4 0.4  PROT 5.5* 5.4* 5.2* 5.1*  ALBUMIN 1.9* 1.8* 1.8* 1.8*   No results for input(s): "LIPASE", "AMYLASE" in the last 168 hours. No results for input(s): "AMMONIA" in the last 168 hours. Coagulation Profile: No results for input(s): "INR", "PROTIME" in the last 168 hours. Cardiac Enzymes: No results for input(s): "CKTOTAL", "CKMB", "CKMBINDEX", "TROPONINI" in the last 168 hours. BNP (last 3 results) No results for input(s): "PROBNP" in the last 8760 hours. HbA1C: No results for input(s): "HGBA1C" in the last 72 hours. CBG: Recent Labs  Lab 12/18/23 2328 12/19/23 0345 12/19/23 0749 12/19/23 1142 12/19/23 1549  GLUCAP 206* 182* 185* 180* 186*   Lipid Profile: No results for input(s): "CHOL", "HDL", "LDLCALC", "TRIG", "CHOLHDL", "LDLDIRECT" in the last 72 hours. Thyroid Function Tests: No results for input(s): "TSH", "T4TOTAL", "FREET4", "T3FREE", "THYROIDAB" in the last 72 hours. Anemia Panel: No results for input(s): "VITAMINB12", "FOLATE", "FERRITIN", "TIBC", "IRON", "RETICCTPCT" in the last 72 hours. Sepsis Labs: No results for input(s): "PROCALCITON", "LATICACIDVEN" in the last 168 hours.  Recent Results (from the past 240 hours)  Culture, blood (single) w Reflex to ID Panel     Status: None   Collection Time: 12/14/23  9:48 PM   Specimen: BLOOD LEFT ARM  Result Value Ref Range Status   Specimen Description BLOOD LEFT ARM  Final   Special Requests   Final    BOTTLES DRAWN AEROBIC AND ANAEROBIC Blood Culture results may not be optimal due to an inadequate volume of blood received in culture bottles   Culture   Final    NO GROWTH 5 DAYS Performed at [facility], [address] [place], [place] [address]    Report Status 12/19/2023 FINAL  Final  Resp panel by RT-PCR (RSV, Flu A&B, Covid) Anterior Nasal Swab     Status:  None   Collection Time: 12/15/23  8:07 AM   Specimen: Anterior Nasal Swab  Result Value Ref Range Status   SARS Coronavirus 2 by RT PCR NEGATIVE NEGATIVE  Final   Influenza A by PCR NEGATIVE NEGATIVE Final   Influenza B by PCR NEGATIVE NEGATIVE Final    Comment: (NOTE) The Xpert Xpress SARS-CoV-2/FLU/RSV plus assay is intended as an aid in the diagnosis of influenza from Nasopharyngeal swab specimens and should not be used as a sole basis for treatment. Nasal washings and aspirates are unacceptable for Xpert Xpress SARS-CoV-2/FLU/RSV testing.  Fact Sheet for Patients: BloggerCourse.com  Fact Sheet for Healthcare Providers: SeriousBroker.it  This test is not yet approved or cleared by the [place] FDA and has been authorized for detection and/or diagnosis of SARS-CoV-2 by FDA under an Emergency Use Authorization (EUA). This EUA will remain in effect (meaning this test can be used) for the duration of the COVID-19 declaration under Section 564(b)(1) of the Act, 21 U.S.C. section 360bbb-3(b)(1), unless the authorization is terminated or revoked.     Resp Syncytial Virus by PCR NEGATIVE NEGATIVE Final    Comment: (NOTE) Fact Sheet for Patients: BloggerCourse.com  Fact Sheet for Healthcare Providers: SeriousBroker.it  This test is not yet approved or cleared by the [place] FDA and has been authorized for detection and/or diagnosis of SARS-CoV-2 by FDA under an Emergency Use Authorization (EUA). This EUA will remain in effect (meaning this test can be used) for the duration of the COVID-19 declaration under Section 564(b)(1) of the Act, 21 U.S.C. section 360bbb-3(b)(1), unless the authorization is terminated or revoked.  Performed at [facility], [address], [place], [place] [address]     Radiology Studies: CT ABDOMEN WO CONTRAST Result Date:  12/19/2023 CLINICAL DATA:  dysphagia EXAM: CT ABDOMEN WITHOUT CONTRAST TECHNIQUE: Multidetector CT imaging of the abdomen was performed following the standard protocol without IV contrast. RADIATION DOSE REDUCTION: This exam was performed according to the departmental dose-optimization program which includes automated exposure control, adjustment of the mA and/or kV according to patient size and/or use of iterative reconstruction technique. COMPARISON:  CT AP, 06/29/2013.  CHEST XR 12/14/2018. FINDINGS: Lower chest: Calcified coronary atherosclerosis. Minimal patchy opacities of the imaged lung bases, greater on the RIGHT. Hepatobiliary: Normal noncontrast appearance of the liver without focal abnormality. No gallstones, gallbladder wall thickening, or biliary dilatation. Pancreas: No pancreatic ductal dilatation or surrounding inflammatory changes. Spleen: Normal in size without focal abnormality. Small infrahilar accessory spleen. Adrenals/Urinary Tract: Adrenal glands are unremarkable. Small, sub-1 cm rounded renal cortical hyperdensities measuring 60 HU, incompletely characterized though likely to represent small proteinaceous cysts. Kidneys are otherwise normal, without renal calculi or hydronephrosis. Stomach/Bowel: Stomach is within normal limits. Imaged portions of the small bowel and colon are nondilated. Appendix is outside the field of view. No evidence of bowel wall thickening, distention, or inflammatory changes. Vascular/Lymphatic: Aortic atherosclerosis. No enlarged abdominal lymph nodes. Other: No abdominal wall hernia or ascites. Musculoskeletal: Chronic-appearing L2 compression deformity with 50% loss of height. No acute osseous findings. IMPRESSION: 1. No acute abdominal process. 2. Mild prominence of the LEFT hepatic lobe and interposition of distal transverse and splenic flexure of colon. Anatomy is equivocal for percutaneous gastrostomy, with a trial contrast opacification of colon and  insufflation recommended. 3. Chronic L2 compression deformity and Aortic Atherosclerosis (ICD10-I70.0). Additional incidental, chronic and senescent findings as above. Roanna Banning, MD [facility] Radiology Electronically Signed   By: Roanna Banning M.D.   On: 12/19/2023 07:00   Scheduled Meds:  amantadine  100 mg Per Tube BID   amLODipine  5 mg Per Tube Daily   atorvastatin  40  mg Per Tube Daily   feeding supplement (PROSource TF20)  60 mL Per Tube Daily   free water  200 mL Per Tube Q4H   heparin injection (subcutaneous)  5,000 Units Subcutaneous Q12H   insulin aspart  0-15 Units Subcutaneous Q4H   insulin aspart  8 Units Subcutaneous Q4H   insulin glargine  10 Units Subcutaneous Daily   ipratropium  0.5 mg Nebulization Q6H   levalbuterol  0.63 mg Nebulization Q6H   metoCLOPramide  10 mg Per Tube TID AC & HS   metoprolol tartrate  50 mg Per Tube BID   multivitamin with minerals  1 tablet Per Tube Daily   mouth rinse  15 mL Mouth Rinse 4 times per day   pantoprazole (PROTONIX) IV  40 mg Intravenous Q12H   sodium chloride flush  3 mL Intravenous Q12H   Continuous Infusions:  ampicillin-sulbactam (UNASYN) IV 3 g (12/19/23 1609)   chlorproMAZINE (THORAZINE) 12.5 mg in sodium chloride 0.9 % 25 mL IVPB 50 mL/hr at 12/19/23 1000   feeding supplement (OSMOLITE 1.5 CAL) 1,000 mL (12/19/23 1207)    LOS: 19 days   Marguerita Merles, DO [facility] Available via Epic secure chat 7am-7pm After these hours, please refer to coverage provider listed on [email or web address] 12/19/2023, 5:48 PM

## 2023-12-19 NOTE — TOC Progression Note (Signed)
 Transition of Care [facility]) - Progression Note    Patient Details  Name: QUANTE PETTRY MRN: [id number] Date of Birth: Feb 11, 1951  Transition of Care [facility]) CM/SW Contact  Baldemar [place], [place] Phone Number: 12/19/2023, 11:53 AM  Clinical Narrative:   CSW following. Patient not medically ready to discuss disposition at this time, remains NPO, family considering PEG placement, palliative following. CSW to follow to assist with disposition when medically ready.    Expected Discharge Plan: Skilled Nursing Facility Barriers to Discharge: Continued Medical Work up  Expected Discharge Plan and Services In-house Referral: Clinical Social Work, Hospice / Palliative Care     Living arrangements for the past 2 months: Single Family Home, Skilled Nursing Facility                                       Social Determinants of Health (SDOH) Interventions SDOH Screenings   Food Insecurity: No Food Insecurity (11/30/2023)  Housing: Low Risk  (11/30/2023)  Transportation Needs: No Transportation Needs (11/30/2023)  Utilities: Not At Risk (11/30/2023)  Social Connections: Moderately Integrated (11/30/2023)  Tobacco Use: Low Risk  (11/30/2023)    Readmission Risk Interventions    12/08/2023   12:40 PM  Readmission Risk Prevention Plan  Transportation Screening Complete  Medication Review (RN Care Manager) Complete  PCP or Specialist appointment within 3-5 days of discharge Complete  HRI or Home Care Consult Complete  SW Recovery Care/Counseling Consult Complete  Palliative Care Screening Complete  Skilled Nursing Facility Complete

## 2023-12-19 NOTE — Plan of Care (Signed)
  Problem: Fluid Volume: Goal: Ability to maintain a balanced intake and output will improve Outcome: Progressing   Problem: Nutritional: Goal: Maintenance of adequate nutrition will improve Outcome: Progressing   Problem: Coping: Goal: Level of anxiety will decrease Outcome: Progressing   Problem: Elimination: Goal: Will not experience complications related to bowel motility Outcome: Progressing Goal: Will not experience complications related to urinary retention Outcome: Progressing   Problem: Education: Goal: Ability to describe self-care measures that may prevent or decrease complications (Diabetes Survival Skills Education) will improve Outcome: Not Progressing   Problem: Coping: Goal: Ability to adjust to condition or change in health will improve Outcome: Not Progressing   Problem: Health Behavior/Discharge Planning: Goal: Ability to manage health-related needs will improve Outcome: Not Progressing   Problem: Education: Goal: Knowledge of disease or condition will improve Outcome: Not Progressing Goal: Knowledge of secondary prevention will improve (MUST DOCUMENT ALL) Outcome: Not Progressing Goal: Knowledge of patient specific risk factors will improve (DELETE if not current risk factor) Outcome: Not Progressing   Problem: Intracerebral Hemorrhage Tissue Perfusion: Goal: Complications of Intracerebral Hemorrhage will be minimized Outcome: Not Progressing   Problem: Coping: Goal: Will verbalize positive feelings about self Outcome: Not Progressing Goal: Will identify appropriate support needs Outcome: Not Progressing

## 2023-12-19 NOTE — Plan of Care (Signed)
  Problem: Fluid Volume: Goal: Ability to maintain a balanced intake and output will improve Outcome: Progressing   Problem: Nutritional: Goal: Maintenance of adequate nutrition will improve Outcome: Progressing   Problem: Coping: Goal: Level of anxiety will decrease Outcome: Progressing   Problem: Elimination: Goal: Will not experience complications related to bowel motility Outcome: Progressing Goal: Will not experience complications related to urinary retention Outcome: Progressing   Problem: Education: Goal: Ability to describe self-care measures that may prevent or decrease complications (Diabetes Survival Skills Education) will improve Outcome: Not Progressing   Problem: Coping: Goal: Ability to adjust to condition or change in health will improve Outcome: Not Progressing   Problem: Health Behavior/Discharge Planning: Goal: Ability to manage health-related needs will improve Outcome: Not Progressing

## 2023-12-19 NOTE — Evaluation (Addendum)
 Clinical/Bedside Swallow Evaluation Patient Details  Name: William Todd MRN: [id number] Date of Birth: 07-20-1951  Today's Date: 12/19/2023 Time: SLP Start Time (ACUTE ONLY): 0920 SLP Stop Time (ACUTE ONLY): 0950 SLP Time Calculation (min) (ACUTE ONLY): 30 min  Past Medical History:  Past Medical History:  Diagnosis Date   Anxiety    new dx   Chronic kidney disease    Diabetes mellitus    type ii   History of radiation therapy 09/29/13- 11/25/13   prostate 7800 cGy 40 sessions, seminal vesicles 5600 cGy 40 sessions   Hypertension    Prostate cancer (HCC) 06/01/2013   gleason 7   Past Surgical History:  Past Surgical History:  Procedure Laterality Date   CARDIAC CATHETERIZATION  01/01/2013   FRACTURE SURGERY     left foot    HIP ARTHROPLASTY Left 11/16/2023   Procedure: ARTHROPLASTY BIPOLAR HIP (HEMIARTHROPLASTY);  Surgeon: Sheral Apley, MD;  Location: [facility] OR;  Service: Orthopedics;  Laterality: Left;   LEFT HEART CATHETERIZATION WITH CORONARY ANGIOGRAM N/A 01/01/2013   Procedure: LEFT HEART CATHETERIZATION WITH CORONARY ANGIOGRAM;  Surgeon: Robynn Pane, MD;  Location: [facility] CATH LAB;  Service: Cardiovascular;  Laterality: N/A;   PROSTATE BIOPSY  06/01/13   HPI:  72yo male admitted from [facility] 11/29/23 after a fall. PMH: HTN, recurrent falls, malnutrition, DM2, CKD, prostate cancer (2014). Acute left frontal lobe hematoma with multiple bilateral small SAH. CXR concerning for aspiration PNA. Wife wants to pursue PEG    Assessment / Plan / Recommendation  Clinical Impression  Pt presents with improved alertness, but continued inability to follow commands. He continues unsafe for PO intake at this time.   Pt was awake in bed, no family present. Pt was nonverbal but exhibited a few low volume vocalizations during this session. Voice quality was initially clear. Oral care was completed with suction, with successful removal of thick secretions adhered to lingual and palatal  surfaces. RN informed.   Following oral care, pt accepted trials of ice chips, puree, and thin liquid via straw. Poor oral awareness and manipulation noted. Suspect delayed, disorganized swallow reflex. Multiple swallows, change in voice quality and breath sounds with delayed throat clearing and cough observed after all presentations.   At this time, pt is more alert and participatory, but continues unsafe for PO intake. Pt is at high risk of aspiration. Thorough regular oral care with suction is recommended 3-4x/day to minimize oral bacteria and infection risk.  SLP will continue to follow to assess readiness for PO intake and/or instrumental study. RN/MD informed of results and recommendations.  SLP Visit Diagnosis: Dysphagia, unspecified (R13.10)    Aspiration Risk  Severe aspiration risk    Diet Recommendation NPO    Medication Administration: Via alternative means    Other  Recommendations Oral Care Recommendations: Oral care QID Caregiver Recommendations: Have oral suction available    Recommendations for follow up therapy are one component of a multi-disciplinary discharge planning process, led by the attending physician.  Recommendations may be updated based on patient status, additional functional criteria and insurance authorization.  Follow up Recommendations Follow physician's recommendations for discharge plan and follow up therapies      Assistance Recommended at Discharge  24/7  Functional Status Assessment Patient has had a recent decline in their functional status and/or demonstrates limited ability to make significant improvements in function in a reasonable and predictable amount of time   Frequency and Duration min 1 x/week  2 weeks  Prognosis Prognosis for improved oropharyngeal function: Fair Barriers to Reach Goals: Cognitive deficits      Swallow Study   General Date of Onset: 11/29/23 HPI: 73yo male admitted from [facility] 11/29/23 after a fall.  PMH: HTN, recurrent falls, malnutrition, DM2, CKD, prostate cancer (2014). Acute left frontal lobe hematoma with multiple bilateral small SAH. CXR concerning for aspiration PNA. Wife wants to pursue PEG Type of Study: Bedside Swallow Evaluation Previous Swallow Assessment: BSE 12/01/23. SLP signed off due to AMS, inappropriateness for PO intake after multiple attempts Diet Prior to this Study: NPO Temperature Spikes Noted: No Respiratory Status: Room air History of Recent Intubation: No Behavior/Cognition: Alert;Requires cueing;Doesn't follow directions Oral Cavity Assessment: Dried secretions Oral Care Completed by SLP: Yes (successful removal of thick secretions from lingual and palatal surfaces) Oral Cavity - Dentition: Adequate natural dentition Vision:  (unable to assess) Self-Feeding Abilities: Total assist Patient Positioning: Upright in bed Baseline Vocal Quality: Aphonic Volitional Cough: Cognitively unable to elicit Volitional Swallow: Unable to elicit    Oral/Motor/Sensory Function Overall Oral Motor/Sensory Function: Other (comment) (unable to assess - pt unable to follow commands)   Ice Chips Ice chips: Impaired Presentation: Spoon Oral Phase Impairments: Poor awareness of bolus Oral Phase Functional Implications: Oral holding;Other (comment) (no oral manipulation) Pharyngeal Phase Impairments: Suspected delayed Swallow   Thin Liquid Thin Liquid: Impaired Presentation: Straw Oral Phase Impairments: Poor awareness of bolus;Reduced labial seal Oral Phase Functional Implications: Prolonged oral transit;Oral holding Pharyngeal  Phase Impairments: Suspected delayed Swallow;Multiple swallows;Throat Clearing - Immediate;Cough - Immediate;Other (comments) (audible swallow)    Nectar Thick Nectar Thick Liquid: Not tested   Honey Thick Honey Thick Liquid: (P) Not tested   Puree Puree: Impaired Presentation: Spoon Oral Phase Impairments: Poor awareness of bolus;Impaired  mastication Oral Phase Functional Implications: Prolonged oral transit Pharyngeal Phase Impairments: Suspected delayed Swallow;Cough - Immediate;Multiple swallows;Other (comments) (audible swallow, appeared disorganized)   Solid     Solid: Not tested     Kimberla Driskill B. Murvin Natal, [facility], CCC-SLP Speech Language Pathologist Office: [phone]  Leigh Aurora 12/19/2023,10:06 AM

## 2023-12-20 DIAGNOSIS — N179 Acute kidney failure, unspecified: Secondary | ICD-10-CM | POA: Diagnosis not present

## 2023-12-20 DIAGNOSIS — I619 Nontraumatic intracerebral hemorrhage, unspecified: Secondary | ICD-10-CM | POA: Diagnosis not present

## 2023-12-20 DIAGNOSIS — G9341 Metabolic encephalopathy: Secondary | ICD-10-CM | POA: Diagnosis not present

## 2023-12-20 DIAGNOSIS — I609 Nontraumatic subarachnoid hemorrhage, unspecified: Secondary | ICD-10-CM | POA: Diagnosis not present

## 2023-12-20 LAB — COMPREHENSIVE METABOLIC PANEL
ALT: 114 U/L — ABNORMAL HIGH (ref 0–44)
AST: 73 U/L — ABNORMAL HIGH (ref 15–41)
Albumin: 1.9 g/dL — ABNORMAL LOW (ref 3.5–5.0)
Alkaline Phosphatase: 117 U/L (ref 38–126)
Anion gap: 11 (ref 5–15)
BUN: 25 mg/dL — ABNORMAL HIGH (ref 8–23)
CO2: 24 mmol/L (ref 22–32)
Calcium: 8.3 mg/dL — ABNORMAL LOW (ref 8.9–10.3)
Chloride: 109 mmol/L (ref 98–111)
Creatinine, Ser: 1.27 mg/dL — ABNORMAL HIGH (ref 0.61–1.24)
GFR, Estimated: 60 mL/min — ABNORMAL LOW (ref >60)
Glucose, Bld: 240 mg/dL — ABNORMAL HIGH (ref 70–99)
Potassium: 4.4 mmol/L (ref 3.5–5.1)
Sodium: 144 mmol/L (ref 135–145)
Total Bilirubin: 0.6 mg/dL (ref 0.0–1.2)
Total Protein: 5.4 g/dL — ABNORMAL LOW (ref 6.5–8.1)

## 2023-12-20 LAB — GLUCOSE, CAPILLARY
Glucose-Capillary: 157 mg/dL — ABNORMAL HIGH (ref 70–99)
Glucose-Capillary: 170 mg/dL — ABNORMAL HIGH (ref 70–99)
Glucose-Capillary: 247 mg/dL — ABNORMAL HIGH (ref 70–99)
Glucose-Capillary: 216 mg/dL — ABNORMAL HIGH (ref 70–99)
Glucose-Capillary: 165 mg/dL — ABNORMAL HIGH (ref 70–99)
Glucose-Capillary: 103 mg/dL — ABNORMAL HIGH (ref 70–99)

## 2023-12-20 LAB — CBC WITH DIFFERENTIAL/PLATELET
Abs Immature Granulocytes: 0.1 10*3/uL — ABNORMAL HIGH (ref 0.00–0.07)
Basophils Absolute: 0 10*3/uL (ref 0.0–0.1)
Basophils Relative: 0 %
Eosinophils Absolute: 0.1 10*3/uL (ref 0.0–0.5)
Eosinophils Relative: 1 %
HCT: 24.7 % — ABNORMAL LOW (ref 39.0–52.0)
Hemoglobin: 7.6 g/dL — ABNORMAL LOW (ref 13.0–17.0)
Immature Granulocytes: 1 %
Lymphocytes Relative: 5 %
Lymphs Abs: 0.4 10*3/uL — ABNORMAL LOW (ref 0.7–4.0)
MCH: 30 pg (ref 26.0–34.0)
MCHC: 30.8 g/dL (ref 30.0–36.0)
MCV: 97.6 fL (ref 80.0–100.0)
Monocytes Absolute: 0.4 10*3/uL (ref 0.1–1.0)
Monocytes Relative: 5 %
Neutro Abs: 7.7 10*3/uL (ref 1.7–7.7)
Neutrophils Relative %: 88 %
Platelets: 197 10*3/uL (ref 150–400)
RBC: 2.53 MIL/uL — ABNORMAL LOW (ref 4.22–5.81)
RDW: 16.7 % — ABNORMAL HIGH (ref 11.5–15.5)
WBC: 8.7 10*3/uL (ref 4.0–10.5)
nRBC: 0 % (ref 0.0–0.2)

## 2023-12-20 LAB — MAGNESIUM: Magnesium: 2.1 mg/dL (ref 1.7–2.4)

## 2023-12-20 LAB — PHOSPHORUS: Phosphorus: 3.3 mg/dL (ref 2.5–4.6)

## 2023-12-20 MED ORDER — LEVALBUTEROL HCL 0.63 MG/3ML IN NEBU: 0.6300 mg | INHALATION_SOLUTION | Freq: Four times a day (QID) | RESPIRATORY_TRACT | Status: DC | PRN

## 2023-12-20 NOTE — Plan of Care (Signed)
  Problem: Coping: Goal: Ability to adjust to condition or change in health will improve Outcome: Progressing   Problem: Metabolic: Goal: Ability to maintain appropriate glucose levels will improve Outcome: Progressing   Problem: Clinical Measurements: Goal: Will remain free from infection Outcome: Progressing   Problem: Clinical Measurements: Goal: Respiratory complications will improve Outcome: Progressing   Problem: Activity: Goal: Risk for activity intolerance will decrease Outcome: Progressing   Problem: Nutrition: Goal: Adequate nutrition will be maintained Outcome: Progressing   Problem: Safety: Goal: Ability to remain free from injury will improve Outcome: Progressing   Problem: Skin Integrity: Goal: Risk for impaired skin integrity will decrease Outcome: Progressing

## 2023-12-20 NOTE — Progress Notes (Signed)
 PROGRESS NOTE    William Todd  ZOX:[id number] DOB: 04/11/1951 DOA: 11/29/2023 PCP: Knox Royalty, MD   Brief Narrative:  This 73 year old Male with past medical history significant for hypertension, recurrents mechanical fall, protein caloric malnutrition, diabetes type 2, chronic kidney disease, prostate cancer 2014 who presents via EMS from [facility] after suffering ground-level fall at [facility].  Workup on admission revealed acute intraparenchymal hemorrhage of the left frontal lobe with multiple bilateral small volume SAH.  Neurosurgery was consulted with no surgical intervention recommended.  While boarding in the ED patient had repeat CT which showed ICH revealed progressive left frontal hematoma with intraventricular extension into the fourth ventricle resulting in anterior midline shift of 5 mm, no hydrocephalus.  Neurosurgery reviewed imaging and recommended admission to the ICU under CCM for close monitoring.   2/15 presented after ground-level fall from SNF workup revealed ICH with bilateral [facility] 2/16 repeat CT while boarding in ED revealed progressive left frontal hematoma with intraventricular extension into the fourth ventricle resulting in anterior midline shift of 5 mm, no hydrocephalus.  Admission changed to ICU 2/18-CT angio, CT head with enlarging clot 2/20 - Stable left frontal hematoma with mild mass effect. 2/26: At this time bleeding has been stable for over a week.  Neurosurgery signed off. 3/1: Code status changed to DNR/DNI. Family at bed side.  Spiked a Temp and likely from Aspiration and now improved ands s/p IV Unasyn now. Palliative Consulted and having GOC Discussions. Will reconsult SLP and have IR evaluate for PEG given that he is likely not to pass his Swallow Test.  SLP reevaluated given his increased alertness but they feel that he is continuing to be unsafe for p.o. intake and recommending continuing to follow given his high risk of aspiration.  Assessment and  Plan:  Left Frontal Intracerebral Traumatic Hematoma: Patient presented with B/L SAH sec.to trauma. Status post 7 days of prophylactic Keppra. CT head 12/16 left frontal small ICH with bilateral small SAH. Traumatic contusion. Repeat CT 2/16 showed significant and large hematoma of the left frontal area with multilobe configuration and IVH and SAH, 5 mm midline shift. CTA head and neck: 60% right ICA involved bilateral VA moderate to severe stenosis, moderate left VA origin stenosis and mild to moderate bilateral ICA; MRI with and without contrast unchanged size of large left frontal intraparenchymal hematoma with 4 mm anterior rightward midline shift.  Subarachnoid blood over both hemisphere. EEG showed left frontal sharp waves, moderate diffuse encephalopathy, no seizure. Patient was on aspirin prior to admission,  now off, No antithrombotic due to ICH. At this time bleeding has been stable for over a week.  Neurosurgery signed off. Still not been able to swallow. SLP signed off but will re-consult. Plan is for PEG tube now as doubt he will be able to participate in SLP evaluation.  Family is agreeable and undergoing Palliative Care discussions. IR consulted for PEG tube placement and they have ordered a CT Abd/Pelvis w/o Contrast to evaluate anatomy for PEG placement and this was equivocal for percutaneous gastrostomy and the recommendation from the radiologist was a trial of contrast opacification of the colon and insufflation recommended. Discussed with Dr. Milford Cage on 12/19/23 who states the IR APP will evaluate this further and Order has been placed for IR Gastrostomy Tube with Moderate Sedation and pending to be done.    Cerebral Edema: Repeat CT 2/17, 5 mm rightward  midline shift. CT 2/18: MLS  7/4mm MRI: 2/22:MLS 4mm. Sodium improved.  Aspiration Pneumonia:  Continued to spike fevers but now improving. Chest x-ray concerning for aspiration pneumonia, Started on Unasyn. Infectious disease consulted.  Continued IV Unasyn x 5 days. COVID, RSV, influenza negative. C/w Pulmonary Toileting and add Xopenex/Atrovent. WBC now 9.1. Repeat CXR in the AM   Hypertension: Goal is normotensive. Continue Amlodipine 5 mg po Daily and Metoprolol Tartrate 50 mg per Tube BID.CTM BP per protocol. Last BP reading was 129/67   Diabetes Mellitus Type 2: C/w Moderate Novolog SSI AC, Insulin Glargine 10 units sq Daily, and 5 units tube feeding coverage every 4 hours increased to 8 units. CBGs ranging from 145-247 the last 5 checks   History of frailty with recurrent mechanical falls: PT/OT > SNF  HyperNa+: Mild and improved Na+ is now 144. D5W @ 75 mL/hr now stopped.C/w  FW Flushes of 200 mL q4h for now. CTM and Trend and repeat CMP in the AM  AKI on CKD Stage 3a: BUN/Cr had worsened and peaked at 62/1.92. He is s/p infusion of D5W @ 75 mL/hr and BUN/Cr has improved to 25/1.27. Avoid Nephrotoxic Medications, Contrast Dyes (if possible), Hypotension and Dehydration to Ensure Adequate Renal Perfusion and will need to Renally Adjust Meds. CTM and Trend Renal Function carefully and repeat CMP in the AM   Abnormal LFTs: Slowly improving now. AST has gone from 132 -> 100 -> 75  -> 73 and ALT has gone from 143 -> 132 -> 121 -> 114.  Continue to monitor and trend and repeat CMP in a.m. and if necessary will obtain right upper quadrant ultrasound but they are slowly trending down  Normocytic Anemia: Was Trending down. H/H now slowly improving and is 7.6/24.7 on last check. Check Anemia Panel in the AM. CTM for S/Sx of Bleeding. May need pRBC transfusion. Repeat CBC in the AM  GERD/GI Prophylaxis: C/w IV PPI with Pantoprazole 40 mg q12h  Hypoalbuminemia: Albumin Level is again 1.8. CTM and Trend and repeat CMP in the AM  Pressure Injury, poA Pressure Injury 11/30/23 Hip Left Stage 2 -  Partial thickness loss of dermis presenting as a shallow open injury with a red, pink wound bed without slough. (Active)  11/30/23 2000   Location: Hip  Location Orientation: Left  Staging: Stage 2 -  Partial thickness loss of dermis presenting as a shallow open injury with a red, pink wound bed without slough.  Wound Description (Comments):   Present on Admission:      Pressure Injury 12/01/23 Knee Left;Posterior Stage 3 -  Full thickness tissue loss. Subcutaneous fat may be visible but bone, tendon or muscle are NOT exposed. (Active)  12/01/23 2000  Location: Knee  Location Orientation: Left;Posterior  Staging: Stage 3 -  Full thickness tissue loss. Subcutaneous fat may be visible but bone, tendon or muscle are NOT exposed.  Wound Description (Comments):   Present on Admission:      Pressure Injury 12/14/23 Foot Left;Lateral Unstageable - Full thickness tissue loss in which the base of the injury is covered by slough (yellow, tan, gray, green or brown) and/or eschar (tan, brown or black) in the wound bed. left lateral foot (Active)  12/14/23 0730  Location: Foot  Location Orientation: Left;Lateral (Left Lateral Foot)  Staging: Unstageable - Full thickness tissue loss in which the base of the injury is covered by slough (yellow, tan, gray, green or brown) and/or eschar (tan, brown or black) in the wound bed.  Wound Description (Comments): left lateral foot  Present on Admission:  Pressure Injury 12/19/23 Sacrum Bilateral Stage 2 -  Partial thickness loss of dermis presenting as a shallow open injury with a red, pink wound bed without slough. (Active)  12/19/23 0800  Location: Sacrum  Location Orientation: Bilateral  Staging: Stage 2 -  Partial thickness loss of dermis presenting as a shallow open injury with a red, pink wound bed without slough.  Wound Description (Comments):   Present on Admission: No   Severe Malnutrition in the context of Chronic illness: Nutrition Problem: Severe Malnutrition Etiology: chronic illness Signs/Symptoms: severe fat depletion, severe muscle depletion, percent weight  loss Interventions: Tube feeding, MVI, Refer to RD note for recommendations   DVT prophylaxis: heparin injection 5,000 Units Start: 12/06/23 1000 SCDs Start: 11/30/23 0334 Place TED hose Start: 11/30/23 0334    Code Status: Limited: Do not attempt resuscitation (DNR) -DNR-LIMITED -Do Not Intubate/DNI  Family Communication: Discussed with wife at bedside  Disposition Plan:  Level of care: Progressive Status is: Inpatient Remains inpatient appropriate because: Further clinical improvement and evaluation by the IR team for PEG placement given that he is not able to really swallow properly and is remaining NPO.  Will go to SNF once stable   Consultants:  Neurosurgery Neurology ID Palliative Care Medicine Interventional Radiology  Procedures:  As delineated as above  Antimicrobials:  Anti-infectives (From admission, onward)    Start     Dose/Rate Route Frequency Ordered Stop   12/15/23 1200  Ampicillin-Sulbactam (UNASYN) 3 g in sodium chloride 0.9 % 100 mL IVPB        3 g 200 mL/hr over 30 Minutes Intravenous Every 6 hours 12/15/23 1018 12/20/23 0551   12/15/23 0845  ceFEPIme (MAXIPIME) 2 g in sodium chloride 0.9 % 100 mL IVPB  Status:  Discontinued        2 g 200 mL/hr over 30 Minutes Intravenous Every 12 hours 12/15/23 0745 12/15/23 1018   12/14/23 1515  Ampicillin-Sulbactam (UNASYN) 3 g in sodium chloride 0.9 % 100 mL IVPB  Status:  Discontinued        3 g 200 mL/hr over 30 Minutes Intravenous Every 6 hours 12/14/23 1422 12/15/23 0745       Subjective: Seen and examined at bedside and he is a little bit more interactive again today and continues to look bilateral spoken to.  Still does not have commands.  Wife states that has been doing okay and no concerns but was asking about if IR would come by today and had questions about PEG tube placement.  Objective: Vitals:   12/20/23 0806 12/20/23 0907 12/20/23 1109 12/20/23 1518  BP: 138/70  138/72 129/67  Pulse: 100  91 95   Resp: 18  18 18   Temp: 98.6 F (37 C)  99 F (37.2 C) 98.1 F (36.7 C)  TempSrc: Oral  Oral Oral  SpO2: 100% 100% 100% 99%  Weight:      Height:        Intake/Output Summary (Last 24 hours) at 12/20/2023 1816 Last data filed at 12/20/2023 1134 Gross per 24 hour  Intake 1871.67 ml  Output 2130 ml  Net -258.33 ml   Filed Weights   12/18/23 0500 12/19/23 0500 12/20/23 0405  Weight: 67.6 kg 64.6 kg 63 kg   Examination: Physical Exam:  Constitutional: Thin chronically ill-appearing African-American male who continues to be little bit more interactive and does open his eyes when you speak to him but not able to follow commands Respiratory: Diminished to auscultation bilaterally, no wheezing, rales, rhonchi  or crackles. Normal respiratory effort and patient is not tachypenic. No accessory muscle use.  Unlabored breathing Cardiovascular: RRR, no murmurs / rubs / gallops. S1 and S2 auscultated. No extremity edema. Abdomen: Soft, non-tender, nondistended. Bowel sounds positive.  GU: Deferred. Musculoskeletal: No clubbing / cyanosis of digits/nails. No joint deformity upper and lower extremities.  Skin: No rashes, lesions, ulcers on limited skin evaluation. No induration; Warm and dry.  Neurologic: Opens his eyes and he does look at you. Psychiatric: Appears calm.   Data Reviewed: I have personally reviewed following labs and imaging studies  CBC: Recent Labs  Lab 12/16/23 0627 12/17/23 0644 12/18/23 0700 12/19/23 0711 12/20/23 0922  WBC 10.5 9.7 8.7 9.1 8.7  NEUTROABS 9.5* 8.6* 7.5 7.8* 7.7  HGB 7.4* 7.0* 7.0* 7.3* 7.6*  HCT 23.1* 22.6* 22.4* 24.4* 24.7*  MCV 95.9 97.8 97.8 98.0 97.6  PLT 180 187 188 199 197   Basic Metabolic Panel: Recent Labs  Lab 12/17/23 0644 12/18/23 0700 12/19/23 0711 12/20/23 0922  NA 146* 142 143 144  K 4.1 3.5 3.9 4.4  CL 114* 115* 113* 109  CO2 25 24 26 24   GLUCOSE 211* 92 189* 240*  BUN 62* 45* 34* 25*  CREATININE 1.92* 1.61* 1.48*  1.27*  CALCIUM 7.8* 7.1* 7.7* 8.3*  MG 3.0* 2.6* 2.4 2.1  PHOS 2.5 2.5 3.0 3.3   GFR: Estimated Creatinine Clearance: 46.2 mL/min (A) (by C-G formula based on SCr of 1.27 mg/dL (H)). Liver Function Tests: Recent Labs  Lab 12/16/23 0627 12/17/23 0644 12/18/23 0700 12/19/23 0711 12/20/23 0922  AST 107* 132* 100* 75* 73*  ALT 100* 143* 132* 121* 114*  ALKPHOS 106 121 122 112 117  BILITOT 0.7 0.6 0.4 0.4 0.6  PROT 5.5* 5.4* 5.2* 5.1* 5.4*  ALBUMIN 1.9* 1.8* 1.8* 1.8* 1.9*   No results for input(s): "LIPASE", "AMYLASE" in the last 168 hours. No results for input(s): "AMMONIA" in the last 168 hours. Coagulation Profile: No results for input(s): "INR", "PROTIME" in the last 168 hours. Cardiac Enzymes: No results for input(s): "CKTOTAL", "CKMB", "CKMBINDEX", "TROPONINI" in the last 168 hours. BNP (last 3 results) No results for input(s): "PROBNP" in the last 8760 hours. HbA1C: No results for input(s): "HGBA1C" in the last 72 hours. CBG: Recent Labs  Lab 12/19/23 2349 12/20/23 0337 12/20/23 0806 12/20/23 1114 12/20/23 1546  GLUCAP 145* 157* 170* 247* 216*   Lipid Profile: No results for input(s): "CHOL", "HDL", "LDLCALC", "TRIG", "CHOLHDL", "LDLDIRECT" in the last 72 hours. Thyroid Function Tests: No results for input(s): "TSH", "T4TOTAL", "FREET4", "T3FREE", "THYROIDAB" in the last 72 hours. Anemia Panel: No results for input(s): "VITAMINB12", "FOLATE", "FERRITIN", "TIBC", "IRON", "RETICCTPCT" in the last 72 hours. Sepsis Labs: No results for input(s): "PROCALCITON", "LATICACIDVEN" in the last 168 hours.  Recent Results (from the past 240 hours)  Culture, blood (single) w Reflex to ID Panel     Status: None   Collection Time: 12/14/23  9:48 PM   Specimen: BLOOD LEFT ARM  Result Value Ref Range Status   Specimen Description BLOOD LEFT ARM  Final   Special Requests   Final    BOTTLES DRAWN AEROBIC AND ANAEROBIC Blood Culture results may not be optimal due to an  inadequate volume of blood received in culture bottles   Culture   Final    NO GROWTH 5 DAYS Performed at [facility], [address], [place], [place] [address]    Report Status 12/19/2023 FINAL  Final  Resp panel  by RT-PCR (RSV, Flu A&B, Covid) Anterior Nasal Swab     Status: None   Collection Time: 12/15/23  8:07 AM   Specimen: Anterior Nasal Swab  Result Value Ref Range Status   SARS Coronavirus 2 by RT PCR NEGATIVE NEGATIVE Final   Influenza A by PCR NEGATIVE NEGATIVE Final   Influenza B by PCR NEGATIVE NEGATIVE Final    Comment: (NOTE) The Xpert Xpress SARS-CoV-2/FLU/RSV plus assay is intended as an aid in the diagnosis of influenza from Nasopharyngeal swab specimens and should not be used as a sole basis for treatment. Nasal washings and aspirates are unacceptable for Xpert Xpress SARS-CoV-2/FLU/RSV testing.  Fact Sheet for Patients: BloggerCourse.com  Fact Sheet for Healthcare Providers: SeriousBroker.it  This test is not yet approved or cleared by the [place] FDA and has been authorized for detection and/or diagnosis of SARS-CoV-2 by FDA under an Emergency Use Authorization (EUA). This EUA will remain in effect (meaning this test can be used) for the duration of the COVID-19 declaration under Section 564(b)(1) of the Act, 21 U.S.C. section 360bbb-3(b)(1), unless the authorization is terminated or revoked.     Resp Syncytial Virus by PCR NEGATIVE NEGATIVE Final    Comment: (NOTE) Fact Sheet for Patients: BloggerCourse.com  Fact Sheet for Healthcare Providers: SeriousBroker.it  This test is not yet approved or cleared by the [place] FDA and has been authorized for detection and/or diagnosis of SARS-CoV-2 by FDA under an Emergency Use Authorization (EUA). This EUA will remain in effect (meaning this test can be used) for the duration of the COVID-19  declaration under Section 564(b)(1) of the Act, 21 U.S.C. section 360bbb-3(b)(1), unless the authorization is terminated or revoked.  Performed at [facility], [address], [place], [place] [address]     Radiology Studies: No results found.  Scheduled Meds:  amantadine  100 mg Per Tube BID   amLODipine  5 mg Per Tube Daily   atorvastatin  40 mg Per Tube Daily   feeding supplement (PROSource TF20)  60 mL Per Tube Daily   free water  200 mL Per Tube Q4H   heparin injection (subcutaneous)  5,000 Units Subcutaneous Q12H   insulin aspart  0-15 Units Subcutaneous Q4H   insulin aspart  8 Units Subcutaneous Q4H   insulin glargine  10 Units Subcutaneous Daily   metoCLOPramide  10 mg Per Tube TID AC & HS   metoprolol tartrate  50 mg Per Tube BID   multivitamin with minerals  1 tablet Per Tube Daily   mouth rinse  15 mL Mouth Rinse 4 times per day   pantoprazole (PROTONIX) IV  40 mg Intravenous Q12H   sodium chloride flush  3 mL Intravenous Q12H   Continuous Infusions:  chlorproMAZINE (THORAZINE) 12.5 mg in sodium chloride 0.9 % 25 mL IVPB 50 mL/hr at 12/19/23 1000   feeding supplement (OSMOLITE 1.5 CAL) 1,000 mL (12/20/23 0826)    LOS: 20 days   Marguerita Merles, DO [facility] Available via Epic secure chat 7am-7pm After these hours, please refer to coverage provider listed on [email or web address] 12/20/2023, 6:16 PM

## 2023-12-21 ENCOUNTER — Inpatient Hospital Stay (HOSPITAL_COMMUNITY)

## 2023-12-21 DIAGNOSIS — I619 Nontraumatic intracerebral hemorrhage, unspecified: Secondary | ICD-10-CM | POA: Diagnosis not present

## 2023-12-21 DIAGNOSIS — I609 Nontraumatic subarachnoid hemorrhage, unspecified: Secondary | ICD-10-CM | POA: Diagnosis not present

## 2023-12-21 DIAGNOSIS — G9341 Metabolic encephalopathy: Secondary | ICD-10-CM | POA: Diagnosis not present

## 2023-12-21 DIAGNOSIS — N179 Acute kidney failure, unspecified: Secondary | ICD-10-CM | POA: Diagnosis not present

## 2023-12-21 LAB — GLUCOSE, CAPILLARY
Glucose-Capillary: 108 mg/dL — ABNORMAL HIGH (ref 70–99)
Glucose-Capillary: 112 mg/dL — ABNORMAL HIGH (ref 70–99)
Glucose-Capillary: 133 mg/dL — ABNORMAL HIGH (ref 70–99)
Glucose-Capillary: 137 mg/dL — ABNORMAL HIGH (ref 70–99)
Glucose-Capillary: 171 mg/dL — ABNORMAL HIGH (ref 70–99)
Glucose-Capillary: 174 mg/dL — ABNORMAL HIGH (ref 70–99)
Glucose-Capillary: 42 mg/dL — CL (ref 70–99)

## 2023-12-21 LAB — PROTIME-INR
INR: 1.3 — ABNORMAL HIGH (ref 0.8–1.2)
Prothrombin Time: 16.4 s — ABNORMAL HIGH (ref 11.4–15.2)

## 2023-12-21 LAB — FERRITIN: Ferritin: 566 ng/mL — ABNORMAL HIGH (ref 24–336)

## 2023-12-21 LAB — COMPREHENSIVE METABOLIC PANEL
ALT: 110 U/L — ABNORMAL HIGH (ref 0–44)
AST: 75 U/L — ABNORMAL HIGH (ref 15–41)
Albumin: 1.9 g/dL — ABNORMAL LOW (ref 3.5–5.0)
Alkaline Phosphatase: 112 U/L (ref 38–126)
Anion gap: 6 (ref 5–15)
BUN: 26 mg/dL — ABNORMAL HIGH (ref 8–23)
CO2: 25 mmol/L (ref 22–32)
Calcium: 8.3 mg/dL — ABNORMAL LOW (ref 8.9–10.3)
Chloride: 112 mmol/L — ABNORMAL HIGH (ref 98–111)
Creatinine, Ser: 1.32 mg/dL — ABNORMAL HIGH (ref 0.61–1.24)
GFR, Estimated: 57 mL/min — ABNORMAL LOW (ref >60)
Glucose, Bld: 139 mg/dL — ABNORMAL HIGH (ref 70–99)
Potassium: 4.3 mmol/L (ref 3.5–5.1)
Sodium: 143 mmol/L (ref 135–145)
Total Bilirubin: 0.6 mg/dL (ref 0.0–1.2)
Total Protein: 5.6 g/dL — ABNORMAL LOW (ref 6.5–8.1)

## 2023-12-21 LAB — MAGNESIUM: Magnesium: 2.2 mg/dL (ref 1.7–2.4)

## 2023-12-21 LAB — CBC WITH DIFFERENTIAL/PLATELET
Abs Immature Granulocytes: 0.11 10*3/uL — ABNORMAL HIGH (ref 0.00–0.07)
Basophils Absolute: 0 10*3/uL (ref 0.0–0.1)
Basophils Relative: 0 %
Eosinophils Absolute: 0.1 10*3/uL (ref 0.0–0.5)
Eosinophils Relative: 1 %
HCT: 23.8 % — ABNORMAL LOW (ref 39.0–52.0)
Hemoglobin: 7.3 g/dL — ABNORMAL LOW (ref 13.0–17.0)
Immature Granulocytes: 1 %
Lymphocytes Relative: 6 %
Lymphs Abs: 0.6 10*3/uL — ABNORMAL LOW (ref 0.7–4.0)
MCH: 30 pg (ref 26.0–34.0)
MCHC: 30.7 g/dL (ref 30.0–36.0)
MCV: 97.9 fL (ref 80.0–100.0)
Monocytes Absolute: 0.5 10*3/uL (ref 0.1–1.0)
Monocytes Relative: 5 %
Neutro Abs: 8.1 10*3/uL — ABNORMAL HIGH (ref 1.7–7.7)
Neutrophils Relative %: 87 %
Platelets: 208 10*3/uL (ref 150–400)
RBC: 2.43 MIL/uL — ABNORMAL LOW (ref 4.22–5.81)
RDW: 16.8 % — ABNORMAL HIGH (ref 11.5–15.5)
WBC: 9.4 10*3/uL (ref 4.0–10.5)
nRBC: 0 % (ref 0.0–0.2)

## 2023-12-21 LAB — IRON AND TIBC
Iron: 32 ug/dL — ABNORMAL LOW (ref 45–182)
Saturation Ratios: 20 % (ref 17.9–39.5)
TIBC: 162 ug/dL — ABNORMAL LOW (ref 250–450)
UIBC: 130 ug/dL

## 2023-12-21 LAB — RETICULOCYTES
Immature Retic Fract: 20.6 % — ABNORMAL HIGH (ref 2.3–15.9)
RBC.: 2.44 MIL/uL — ABNORMAL LOW (ref 4.22–5.81)
Retic Count, Absolute: 102.7 10*3/uL (ref 19.0–186.0)
Retic Ct Pct: 4.2 % — ABNORMAL HIGH (ref 0.4–3.1)

## 2023-12-21 LAB — PHOSPHORUS: Phosphorus: 3 mg/dL (ref 2.5–4.6)

## 2023-12-21 LAB — VITAMIN B12: Vitamin B-12: 874 pg/mL (ref 180–914)

## 2023-12-21 LAB — FOLATE: Folate: 11.6 ng/mL (ref >5.9)

## 2023-12-21 MED ORDER — IOHEXOL 300 MG/ML  SOLN
150.0000 mL | Freq: Once | INTRAMUSCULAR | Status: AC
  Administered 2023-12-23: 150 mL via ORAL

## 2023-12-21 MED ORDER — DEXTROSE 50 % IV SOLN
INTRAVENOUS | Status: AC
  Filled 2023-12-21: qty 50

## 2023-12-21 MED ORDER — DEXTROSE IN LACTATED RINGERS 5 % IV SOLN: INTRAVENOUS | Status: AC

## 2023-12-21 MED ORDER — DEXTROSE 50 % IV SOLN
25.0000 g | INTRAVENOUS | Status: AC
Start: 2023-12-22 — End: 2023-12-21
  Administered 2023-12-21: 25 g via INTRAVENOUS

## 2023-12-21 NOTE — Plan of Care (Signed)
  Problem: Nutritional: Goal: Maintenance of adequate nutrition will improve Outcome: Progressing   Problem: Skin Integrity: Goal: Risk for impaired skin integrity will decrease Outcome: Progressing   Problem: Clinical Measurements: Goal: Will remain free from infection Outcome: Progressing   Problem: Activity: Goal: Risk for activity intolerance will decrease Outcome: Progressing   Problem: Coping: Goal: Level of anxiety will decrease Outcome: Progressing

## 2023-12-21 NOTE — Consult Note (Cosign Needed Addendum)
 Chief Complaint: Need for enteral nutrition  Referring Provider(s): Marguerita Merles  Supervising Physician: Irish Lack  Patient Status: [facility] - In-pt  History of Present Illness: William Todd is a 73 y.o. male with past medical history significant for hypertension, recurrents mechanical fall, protein caloric malnutrition, diabetes type 2, chronic kidney disease, and prostate cancer 2014.  He presented to the ED after a fall.    Workup on admission revealed acute intraparenchymal hemorrhage of the left frontal lobe with multiple bilateral small volume SAH.    Neurosurgery was consulted with no surgical intervention recommended.   While boarding in the ED patient had repeat CT which showed ICH revealed progressive left frontal hematoma with intraventricular extension into the fourth ventricle resulting in anterior midline shift of 5 mm, no hydrocephalus.  IR is asked to evaluate for Gastrostomy tube placement given that he is likely not to pass his Swallow Test.    SLP reevaluated given his increased alertness but they feel that he is continuing to be unsafe for p.o. intake and recommending NPO status due to  his high risk of aspiration.  Cortrak was placed, however it is currently NOT in proper position.    Patient is DNR  Past Medical History:  Diagnosis Date   Anxiety    new dx   Chronic kidney disease    Diabetes mellitus    type ii   History of radiation therapy 09/29/13- 11/25/13   prostate 7800 cGy 40 sessions, seminal vesicles 5600 cGy 40 sessions   Hypertension    Prostate cancer (HCC) 06/01/2013   gleason 7    Past Surgical History:  Procedure Laterality Date   CARDIAC CATHETERIZATION  01/01/2013   FRACTURE SURGERY     left foot    HIP ARTHROPLASTY Left 11/16/2023   Procedure: ARTHROPLASTY BIPOLAR HIP (HEMIARTHROPLASTY);  Surgeon: Sheral Apley, MD;  Location: [facility] OR;  Service: Orthopedics;  Laterality: Left;   LEFT HEART CATHETERIZATION WITH  CORONARY ANGIOGRAM N/A 01/01/2013   Procedure: LEFT HEART CATHETERIZATION WITH CORONARY ANGIOGRAM;  Surgeon: Robynn Pane, MD;  Location[facility] CATH LAB;  Service: Cardiovascular;  Laterality: N/A;   PROSTATE BIOPSY  06/01/13    Allergies: Patient has no known allergies.  Medications: Prior to Admission medications   Medication Sig Start Date End Date Taking? Authorizing Provider  amLODipine (NORVASC) 5 MG tablet Take 1 tablet (5 mg total) by mouth daily. 11/19/23  Yes Almon Hercules, MD  aspirin EC 81 MG tablet Take 1 tablet (81 mg total) by mouth in the morning and at bedtime for 28 days, THEN 1 tablet (81 mg total) daily. 11/19/23 03/16/24 Yes Almon Hercules, MD  atorvastatin (LIPITOR) 80 MG tablet Take 0.5 tablets (40 mg total) by mouth daily at 6 PM. 01/03/13  Yes Rinaldo Cloud, MD  famotidine (PEPCID) 20 MG tablet Take 1 tablet (20 mg total) by mouth 2 (two) times daily. Patient taking differently: Take 20 mg by mouth daily. 01/03/13  Yes Rinaldo Cloud, MD  metoprolol succinate (TOPROL-XL) 50 MG 24 hr tablet Take 1 tablet (50 mg total) by mouth daily. Take with or immediately following a meal. 01/03/13  Yes Rinaldo Cloud, MD  Nutritional Supplements (GLUCERNA CARBSTEADY) LIQD Take 237 mLs by mouth 2 (two) times daily after a meal. Patient taking differently: Take 237 mLs by mouth 3 (three) times daily with meals. 11/20/23  Yes Almon Hercules, MD  sitaGLIPtin (JANUVIA) 50 MG tablet Take 50 mg by mouth daily.  Yes [provider]  nitroGLYCERIN (NITROSTAT) 0.4 MG SL tablet Place 1 tablet (0.4 mg total) under the tongue every 5 (five) minutes x 3 doses as needed for chest pain. Patient not taking: Reported on 11/30/2023 01/03/13   Rinaldo Cloud, MD  polyethylene glycol powder (MIRALAX) 17 GM/SCOOP powder Take 17 g by mouth 2 (two) times daily as needed for moderate constipation. Patient not taking: Reported on 11/30/2023 11/19/23   Almon Hercules, MD  senna-docusate (SENOKOT-S) 8.6-50 MG tablet  Take 1 tablet by mouth 2 (two) times daily between meals as needed for mild constipation. Patient not taking: Reported on 11/30/2023 11/19/23   Almon Hercules, MD     Family History  Problem Relation Age of Onset   Prostate cancer Brother 71       seed implant    Social History   Socioeconomic History   Marital status: Married    Spouse name: Not on file   Number of children: Not on file   Years of education: Not on file   Highest education level: Not on file  Occupational History   Not on file  Tobacco Use   Smoking status: Never   Smokeless tobacco: Never  Substance and Sexual Activity   Alcohol use: No    Comment: quit years  ago   Drug use: No   Sexual activity: Not Currently  Other Topics Concern   Not on file  Social History Narrative   Not on file   Social Drivers of Health   Financial Resource Strain: Not on file  Food Insecurity: No Food Insecurity (11/30/2023)   Hunger Vital Sign    Worried About Running Out of Food in the Last Year: Never true    Ran Out of Food in the Last Year: Never true  Transportation Needs: No Transportation Needs (11/30/2023)   PRAPARE - Administrator, Civil Service (Medical): No    Lack of Transportation (Non-Medical): No  Physical Activity: Not on file  Stress: Not on file  Social Connections: Moderately Integrated (11/30/2023)   Social Connection and Isolation Panel [NHANES]    Frequency of Communication with Friends and Family: More than three times a week    Frequency of Social Gatherings with Friends and Family: More than three times a week    Attends Religious Services: More than 4 times per year    Active Member of [employer] or Organizations: No    Attends Engineer, structural: Never    Marital Status: Married    Review of Systems  Unable to perform ROS: Patient unresponsive    Vital Signs: BP 114/65 (BP Location: Right Arm)   Pulse 83   Temp 98.1 F (36.7 C) (Oral)   Resp 18   Ht 6' (1.829 m)   Wt  139 lb 8.8 oz (63.3 kg)   SpO2 100%   BMI 18.93 kg/m   Advance Care Plan: The advanced care place/surrogate decision maker was discussed at the time of visit and the patient did not wish to discuss or was not able to name a surrogate decision maker or provide an advance care plan.  Physical Exam Constitutional:      Appearance: He is ill-appearing.  HENT:     Head: Normocephalic and atraumatic.  Cardiovascular:     Rate and Rhythm: Normal rate.  Pulmonary:     Effort: Pulmonary effort is normal. No respiratory distress.  Abdominal:     Palpations: Abdomen is soft.  Tenderness: There is no abdominal tenderness.  Skin:    General: Skin is warm and dry.     Imaging:  CLINICAL DATA:  dysphagia   EXAM: CT ABDOMEN WITHOUT CONTRAST   TECHNIQUE: Multidetector CT imaging of the abdomen was performed following the standard protocol without IV contrast.   RADIATION DOSE REDUCTION: This exam was performed according to the departmental dose-optimization program which includes automated exposure control, adjustment of the mA and/or kV according to patient size and/or use of iterative reconstruction technique.   COMPARISON:  CT AP, 06/29/2013.  CHEST XR 12/14/2018.   FINDINGS: Lower chest: Calcified coronary atherosclerosis. Minimal patchy opacities of the imaged lung bases, greater on the RIGHT.   Hepatobiliary: Normal noncontrast appearance of the liver without focal abnormality. No gallstones, gallbladder wall thickening, or biliary dilatation.   Pancreas: No pancreatic ductal dilatation or surrounding inflammatory changes.   Spleen: Normal in size without focal abnormality. Small infrahilar accessory spleen.   Adrenals/Urinary Tract: Adrenal glands are unremarkable. Small, sub-1 cm rounded renal cortical hyperdensities measuring 60 HU, incompletely characterized though likely to represent small proteinaceous cysts. Kidneys are otherwise normal, without  renal calculi or hydronephrosis.   Stomach/Bowel: Stomach is within normal limits. Imaged portions of the small bowel and colon are nondilated. Appendix is outside the field of view. No evidence of bowel wall thickening, distention, or inflammatory changes.   Vascular/Lymphatic: Aortic atherosclerosis. No enlarged abdominal lymph nodes.   Other: No abdominal wall hernia or ascites.   Musculoskeletal: Chronic-appearing L2 compression deformity with 50% loss of height. No acute osseous findings.   IMPRESSION: 1. No acute abdominal process. 2. Mild prominence of the LEFT hepatic lobe and interposition of distal transverse and splenic flexure of colon. Anatomy is equivocal for percutaneous gastrostomy, with a trial contrast opacification of colon and insufflation recommended. 3. Chronic L2 compression deformity and Aortic Atherosclerosis (ICD10-I70.0). Additional incidental, chronic and senescent findings as above.   Roanna Banning, MD   [facility]   [facility]     Electronically Signed   By: Roanna Banning M.D.   On: 12/19/2023 07:00   Labs:  CBC: Recent Labs    12/18/23 0700 12/19/23 0711 12/20/23 0922 12/21/23 0729  WBC 8.7 9.1 8.7 9.4  HGB 7.0* 7.3* 7.6* 7.3*  HCT 22.4* 24.4* 24.7* 23.8*  PLT 188 199 197 208    COAGS: Recent Labs    11/16/23 0600 11/30/23 0400  INR 1.3* 1.2  APTT  --  25    BMP: Recent Labs    12/18/23 0700 12/19/23 0711 12/20/23 0922 12/21/23 0729  NA 142 143 144 143  K 3.5 3.9 4.4 4.3  CL 115* 113* 109 112*  CO2 24 26 24 25   GLUCOSE 92 189* 240* 139*  BUN 45* 34* 25* 26*  CALCIUM 7.1* 7.7* 8.3* 8.3*  CREATININE 1.61* 1.48* 1.27* 1.32*  GFRNONAA 45* 50* 60* 57*    LIVER FUNCTION TESTS: Recent Labs    12/18/23 0700 12/19/23 0711 12/20/23 0922 12/21/23 0729  BILITOT 0.4 0.4 0.6 0.6  AST 100* 75* 73* 75*  ALT 132* 121* 114* 110*  ALKPHOS 122 112 117 112  PROT 5.2* 5.1*  5.4* 5.6*  ALBUMIN 1.8* 1.8* 1.9* 1.9*    TUMOR MARKERS: No results for input(s): "AFPTM", "CEA", "CA199", "CHROMGRNA" in the last 8760 hours.  Assessment and Plan:  Dysphagia due to acute intraparenchymal hemorrhage of the left frontal lobe with multiple bilateral small volume SAH.  Will plan for gastrostomy  tube placement on Tuesday.  CT showed mild prominence of the LEFT hepatic lobe and interposition of distal transverse and splenic flexure of colon. Anatomy is equivocal for percutaneous gastrostomy, with a trial contrast opacification of colon and insufflation recommended.  Cortrak will need to be replaced/repositioned prior to giving contrast.  I have ordered contrast to be given via Cortrak on Monday evening as long as Cortrak is in correct position.  I have ordered tube feeds to be held starting at [facility] Monday night. I have held Heparin in the [facility].  Ancef in Sign and Held  Risks and benefits image guided gastrostomy tube placement was discussed with the patient's wife including, but not limited to the need for a barium enema during the procedure, bleeding, infection, peritonitis and/or damage to adjacent structures.  All questions were answered, patient is agreeable to proceed.  Consent signed and in IR.  Patient currently has DNR order in place. Discussion with the patient and family regarding wishes.  The original DNR order is maintained and prior treatment limitations are upheld during the procedure.  Thank you for allowing our service to participate in William Todd 's care.  Electronically Signed: Gwynneth Macleod, PA-C   12/21/2023, 12:57 PM      I spent a total of 40 Minutes  in face to face in clinical consultation, greater than 50% of which was counseling/coordinating care for Gtube placement.  (A copy of this note was sent to the referring provider and the time of visit.)

## 2023-12-21 NOTE — Progress Notes (Signed)
 PROGRESS NOTE    William Todd  UJW:[id number] DOB: 1951-07-09 DOA: 11/29/2023 PCP: Knox Royalty, MD   Brief Narrative:  This 73 year old Male with past medical history significant for hypertension, recurrents mechanical fall, protein caloric malnutrition, diabetes type 2, chronic kidney disease, prostate cancer 2014 who presents via EMS from [facility] after suffering ground-level fall at [facility].  Workup on admission revealed acute intraparenchymal hemorrhage of the left frontal lobe with multiple bilateral small volume SAH.  Neurosurgery was consulted with no surgical intervention recommended.  While boarding in the ED patient had repeat CT which showed ICH revealed progressive left frontal hematoma with intraventricular extension into the fourth ventricle resulting in anterior midline shift of 5 mm, no hydrocephalus.  Neurosurgery reviewed imaging and recommended admission to the ICU under CCM for close monitoring.   2/15 presented after ground-level fall from SNF workup revealed ICH with bilateral [facility] 2/16 repeat CT while boarding in ED revealed progressive left frontal hematoma with intraventricular extension into the fourth ventricle resulting in anterior midline shift of 5 mm, no hydrocephalus.  Admission changed to ICU 2/18-CT angio, CT head with enlarging clot 2/20 - Stable left frontal hematoma with mild mass effect. 2/26: At this time bleeding has been stable for over a week.  Neurosurgery signed off. 3/1: Code status changed to DNR/DNI. Family at bed side.  Spiked a Temp and likely from Aspiration and now improved ands s/p IV Unasyn now. Palliative Consulted and having GOC Discussions. Will reconsult SLP and have IR evaluate for PEG given that he is likely not to pass his Swallow Test.  SLP reevaluated given his increased alertness but they feel that he is continuing to be unsafe for p.o. intake and recommending continuing to follow given his high risk of aspiration.  Will now pursue  PEG tube and this is to be placed on Tuesday, 12/23/2023 if core track is properly positioned tomorrow and if contrast can be given through it tomorrow evening.  Assessment and Plan:  Left Frontal Intracerebral Traumatic Hematoma: Patient presented with B/L SAH sec.to trauma. Status post 7 days of prophylactic Keppra. CT head 12/16 left frontal small ICH with bilateral small SAH. Traumatic contusion. Repeat CT 2/16 showed significant and large hematoma of the left frontal area with multilobe configuration and IVH and SAH, 5 mm midline shift. CTA head and neck: 60% right ICA involved bilateral VA moderate to severe stenosis, moderate left VA origin stenosis and mild to moderate bilateral ICA; MRI with and without contrast unchanged size of large left frontal intraparenchymal hematoma with 4 mm anterior rightward midline shift.  Subarachnoid blood over both hemisphere. EEG showed left frontal sharp waves, moderate diffuse encephalopathy, no seizure. Patient was on aspirin prior to admission,  now off, No antithrombotic due to ICH. At this time bleeding has been stable for over a week.  Neurosurgery signed off. Still not been able to swallow. SLP signed off but will re-consult. Plan is for PEG tube now as doubt he will be able to participate in SLP evaluation.  Family is agreeable and undergoing Palliative Care discussions.  -IR consulted for PEG tube placement and they have ordered a CT Abd/Pelvis w/o Contrast to evaluate anatomy for PEG placement and this was equivocal for percutaneous gastrostomy and the recommendation from the radiologist was a trial of contrast opacification of the colon and insufflation recommended. Discussed with Dr. Milford Cage on 12/19/23 who states the IR APP will evaluate this further and Order has been placed for IR Gastrostomy Tube with  Moderate Sedation and pending to be done pm Tuesday as long as Cortak can be repositioned in the proper place prior to giving the contrast (Interventional  Radiology has ordered the contrast to be given via the core track on Monday evening.  The tube feedings to be held starting midnight Monday night and have held the heparin in the morning and for the patient on Ancef in the signed and held orders.)   Cerebral Edema: Repeat CT 2/17, 5 mm rightward  midline shift. CT 2/18: MLS  7/69mm MRI: 2/22:MLS 4mm. Sodium improved.    Aspiration Pneumonia:  Continued to spike fevers but now improving. Chest x-ray concerning for aspiration pneumonia, Started on Unasyn. Infectious disease consulted. Continued IV Unasyn x 5 days. COVID, RSV, influenza negative. C/w Pulmonary Toileting and add Xopenex/Atrovent. WBC now 9.1. Repeat CXR this AM done and showed improving lung opacity but did show that the core track tube had been retracted and sitting in the mid sternum and is not on the stomach anymore.   Hypertension: Goal is normotensive. Continue Amlodipine 5 mg po Daily and Metoprolol Tartrate 50 mg per Tube BID.CTM BP per protocol. Last BP reading was 128/78   Diabetes Mellitus Type 2: C/w Moderate Novolog SSI AC, Insulin Glargine 10 units sq Daily, and 5 units tube feeding coverage every 4 hours increased to 8 units. CBGs ranging from 103-174 the last 5 checks; will need to hold his sliding scale coverage given that his tube feedings have been stopped due to misplacement of his Cortrak   History of frailty with recurrent mechanical falls: PT/OT > SNF  HyperNa+: Mild and improved Na+ is now 143. D5W @ 75 mL/hr now stopped.C/w  FW Flushes of 200 mL q4h for now. CTM and Trend and repeat CMP in the AM  AKI on CKD Stage 3a: BUN/Cr had worsened and peaked at 62/1.92. He is s/p infusion of D5W @ 75 mL/hr and BUN/Cr is now 26/1.32. Avoid Nephrotoxic Medications, Contrast Dyes (if possible), Hypotension and Dehydration to Ensure Adequate Renal Perfusion and will need to Renally Adjust Meds. CTM and Trend Renal Function carefully and repeat CMP in the AM   Abnormal LFTs:  Slowly improving now. AST has gone from 132 -> 100 -> 75  -> 73 -> 75 and ALT has gone from 143 -> 132 -> 121 -> 114 -> 110.  Continue to monitor and trend and repeat CMP in a.m. and if necessary will obtain right upper quadrant ultrasound but they are slowly trending down  Normocytic Anemia: Was Trending down. H/H now slowly improving and is is now 7.3/23.8 and relatively stable.  Anemia panel was checked and showed an iron level of 32, UIBC 130, TIBC of 162, saturation ratios of 20%, ferritin 566, folate of 1.6 and a vitamin B12 874. CTM for S/Sx of Bleeding. May need pRBC transfusion. Repeat CBC in the AM  GERD/GI Prophylaxis: C/w IV PPI with Pantoprazole 40 mg q12h  Hypoalbuminemia: Albumin Level is again 1.9. CTM and Trend and repeat CMP in the AM  Pressure Injury, poA Pressure Injury 11/30/23 Hip Left Stage 2 -  Partial thickness loss of dermis presenting as a shallow open injury with a red, pink wound bed without slough. (Active)  11/30/23 2000  Location: Hip  Location Orientation: Left  Staging: Stage 2 -  Partial thickness loss of dermis presenting as a shallow open injury with a red, pink wound bed without slough.  Wound Description (Comments):   Present on Admission:  Pressure Injury 12/01/23 Knee Left;Posterior Stage 3 -  Full thickness tissue loss. Subcutaneous fat may be visible but bone, tendon or muscle are NOT exposed. (Active)  12/01/23 2000  Location: Knee  Location Orientation: Left;Posterior  Staging: Stage 3 -  Full thickness tissue loss. Subcutaneous fat may be visible but bone, tendon or muscle are NOT exposed.  Wound Description (Comments):   Present on Admission:      Pressure Injury 12/14/23 Foot Left;Lateral Unstageable - Full thickness tissue loss in which the base of the injury is covered by slough (yellow, tan, gray, green or brown) and/or eschar (tan, brown or black) in the wound bed. left lateral foot (Active)  12/14/23 0730  Location: Foot  Location  Orientation: Left;Lateral (Left Lateral Foot)  Staging: Unstageable - Full thickness tissue loss in which the base of the injury is covered by slough (yellow, tan, gray, green or brown) and/or eschar (tan, brown or black) in the wound bed.  Wound Description (Comments): left lateral foot  Present on Admission:      Pressure Injury 12/19/23 Sacrum Bilateral Stage 2 -  Partial thickness loss of dermis presenting as a shallow open injury with a red, pink wound bed without slough. (Active)  12/19/23 0800  Location: Sacrum  Location Orientation: Bilateral  Staging: Stage 2 -  Partial thickness loss of dermis presenting as a shallow open injury with a red, pink wound bed without slough.  Wound Description (Comments):   Present on Admission: No   Severe Malnutrition in the context of Chronic illness: Nutrition Problem: Severe Malnutrition Etiology: chronic illness Signs/Symptoms: severe fat depletion, severe muscle depletion, percent weight loss Interventions: Tube feeding, MVI, Refer to RD note for recommendations; Holding tube feedings for now given that his Cortrak has been dislodged   DVT prophylaxis: heparin injection 5,000 Units Start: 12/06/23 1000 SCDs Start: 11/30/23 0334 Place TED hose Start: 11/30/23 0334    Code Status: Limited: Do not attempt resuscitation (DNR) -DNR-LIMITED -Do Not Intubate/DNI  Family Communication: Discussed with wife at bedside  Disposition Plan:  Level of care: Progressive Status is: Inpatient Remains inpatient appropriate because: His further clinical improvement and clearance by the specialists and have placement of PEG tube if he is a candidate   Consultants:  Neurosurgery Neurology ID Palliative Care Medicine Interventional Radiology  Procedures:  As delineated as above  Antimicrobials:  Anti-infectives (From admission, onward)    Start     Dose/Rate Route Frequency Ordered Stop   12/15/23 1200  Ampicillin-Sulbactam (UNASYN) 3 g in sodium  chloride 0.9 % 100 mL IVPB        3 g 200 mL/hr over 30 Minutes Intravenous Every 6 hours 12/15/23 1018 12/20/23 0551   12/15/23 0845  ceFEPIme (MAXIPIME) 2 g in sodium chloride 0.9 % 100 mL IVPB  Status:  Discontinued        2 g 200 mL/hr over 30 Minutes Intravenous Every 12 hours 12/15/23 0745 12/15/23 1018   12/14/23 1515  Ampicillin-Sulbactam (UNASYN) 3 g in sodium chloride 0.9 % 100 mL IVPB  Status:  Discontinued        3 g 200 mL/hr over 30 Minutes Intravenous Every 6 hours 12/14/23 1422 12/15/23 0745       Subjective: Seen and examined at bedside and continues to reveal a bit more interactive and look at you when he is spoken to but still does not really follow commands.  Unfortunately Cortrak is not in a position and so we will hold his tube feedings.  Current plan is for anticipated PEG placement on Tuesday.  No acute issues overnight.  Objective: Vitals:   12/21/23 0458 12/21/23 0736 12/21/23 1152 12/21/23 1519  BP:  138/70 114/65 128/78  Pulse:  98 83 99  Resp:  18 18 18   Temp:  99.5 F (37.5 C) 98.1 F (36.7 C) 98.3 F (36.8 C)  TempSrc:  Oral Oral Oral  SpO2:  100% 100% 100%  Weight: 63.3 kg     Height:        Intake/Output Summary (Last 24 hours) at 12/21/2023 1727 Last data filed at 12/21/2023 1400 Gross per 24 hour  Intake 1720.83 ml  Output 1280 ml  Net 440.83 ml   Filed Weights   12/19/23 0500 12/20/23 0405 12/21/23 0458  Weight: 64.6 kg 63 kg 63.3 kg   Examination: Physical Exam:  Constitutional: Thin chronically ill-appearing African-American male who will look at you with his eyes but does not verbalize or speak and not able to follow commands Respiratory: Diminished to auscultation bilaterally, no wheezing, rales, rhonchi or crackles. Normal respiratory effort and patient is not tachypenic. No accessory muscle use.  Unlabored breathing Cardiovascular: RRR, no murmurs / rubs / gallops. S1 and S2 auscultated. No extremity edema. Abdomen: Soft,  non-tender, non-distended. Bowel sounds positive.  GU: Deferred. Musculoskeletal: No clubbing / cyanosis of digits/nails. No joint deformity upper and lower extremities.  Skin: No rashes, lesions, ulcers on a limited skin evaluation. No induration; Warm and dry.  Neurologic: Will place eyes and look at you but does not follow commands Psychiatric: Appears calm  Data Reviewed: I have personally reviewed following labs and imaging studies  CBC: Recent Labs  Lab 12/17/23 0644 12/18/23 0700 12/19/23 0711 12/20/23 0922 12/21/23 0729  WBC 9.7 8.7 9.1 8.7 9.4  NEUTROABS 8.6* 7.5 7.8* 7.7 8.1*  HGB 7.0* 7.0* 7.3* 7.6* 7.3*  HCT 22.6* 22.4* 24.4* 24.7* 23.8*  MCV 97.8 97.8 98.0 97.6 97.9  PLT 187 188 199 197 208   Basic Metabolic Panel: Recent Labs  Lab 12/17/23 0644 12/18/23 0700 12/19/23 0711 12/20/23 0922 12/21/23 0729  NA 146* 142 143 144 143  K 4.1 3.5 3.9 4.4 4.3  CL 114* 115* 113* 109 112*  CO2 25 24 26 24 25   GLUCOSE 211* 92 189* 240* 139*  BUN 62* 45* 34* 25* 26*  CREATININE 1.92* 1.61* 1.48* 1.27* 1.32*  CALCIUM 7.8* 7.1* 7.7* 8.3* 8.3*  MG 3.0* 2.6* 2.4 2.1 2.2  PHOS 2.5 2.5 3.0 3.3 3.0   GFR: Estimated Creatinine Clearance: 44.6 mL/min (A) (by C-G formula based on SCr of 1.32 mg/dL (H)). Liver Function Tests: Recent Labs  Lab 12/17/23 0644 12/18/23 0700 12/19/23 0711 12/20/23 0922 12/21/23 0729  AST 132* 100* 75* 73* 75*  ALT 143* 132* 121* 114* 110*  ALKPHOS 121 122 112 117 112  BILITOT 0.6 0.4 0.4 0.6 0.6  PROT 5.4* 5.2* 5.1* 5.4* 5.6*  ALBUMIN 1.8* 1.8* 1.8* 1.9* 1.9*   No results for input(s): "LIPASE", "AMYLASE" in the last 168 hours. No results for input(s): "AMMONIA" in the last 168 hours. Coagulation Profile: Recent Labs  Lab 12/21/23 1509  INR 1.3*   Cardiac Enzymes: No results for input(s): "CKTOTAL", "CKMB", "CKMBINDEX", "TROPONINI" in the last 168 hours. BNP (last 3 results) No results for input(s): "PROBNP" in the last 8760  hours. HbA1C: No results for input(s): "HGBA1C" in the last 72 hours. CBG: Recent Labs  Lab 12/20/23 2316 12/21/23 0408 12/21/23 0807 12/21/23 1200 12/21/23 1520  GLUCAP  103* 137* 174* 112* 108*   Lipid Profile: No results for input(s): "CHOL", "HDL", "LDLCALC", "TRIG", "CHOLHDL", "LDLDIRECT" in the last 72 hours. Thyroid Function Tests: No results for input(s): "TSH", "T4TOTAL", "FREET4", "T3FREE", "THYROIDAB" in the last 72 hours. Anemia Panel: Recent Labs    12/21/23 0729  VITAMINB12 874  FOLATE 11.6  FERRITIN 566*  TIBC 162*  IRON 32*  RETICCTPCT 4.2*   Sepsis Labs: No results for input(s): "PROCALCITON", "LATICACIDVEN" in the last 168 hours.  Recent Results (from the past 240 hours)  Culture, blood (single) w Reflex to ID Panel     Status: None   Collection Time: 12/14/23  9:48 PM   Specimen: BLOOD LEFT ARM  Result Value Ref Range Status   Specimen Description BLOOD LEFT ARM  Final   Special Requests   Final    BOTTLES DRAWN AEROBIC AND ANAEROBIC Blood Culture results may not be optimal due to an inadequate volume of blood received in culture bottles   Culture   Final    NO GROWTH 5 DAYS Performed at [facility], [address] [place], [place] [address]    Report Status 12/19/2023 FINAL  Final  Resp panel by RT-PCR (RSV, Flu A&B, Covid) Anterior Nasal Swab     Status: None   Collection Time: 12/15/23  8:07 AM   Specimen: Anterior Nasal Swab  Result Value Ref Range Status   SARS Coronavirus 2 by RT PCR NEGATIVE NEGATIVE Final   Influenza A by PCR NEGATIVE NEGATIVE Final   Influenza B by PCR NEGATIVE NEGATIVE Final    Comment: (NOTE) The Xpert Xpress SARS-CoV-2/FLU/RSV plus assay is intended as an aid in the diagnosis of influenza from Nasopharyngeal swab specimens and should not be used as a sole basis for treatment. Nasal washings and aspirates are unacceptable for Xpert Xpress SARS-CoV-2/FLU/RSV testing.  Fact Sheet for  Patients: BloggerCourse.com  Fact Sheet for Healthcare Providers: SeriousBroker.it  This test is not yet approved or cleared by the [place] FDA and has been authorized for detection and/or diagnosis of SARS-CoV-2 by FDA under an Emergency Use Authorization (EUA). This EUA will remain in effect (meaning this test can be used) for the duration of the COVID-19 declaration under Section 564(b)(1) of the Act, 21 U.S.C. section 360bbb-3(b)(1), unless the authorization is terminated or revoked.     Resp Syncytial Virus by PCR NEGATIVE NEGATIVE Final    Comment: (NOTE) Fact Sheet for Patients: BloggerCourse.com  Fact Sheet for Healthcare Providers: SeriousBroker.it  This test is not yet approved or cleared by the [place] FDA and has been authorized for detection and/or diagnosis of SARS-CoV-2 by FDA under an Emergency Use Authorization (EUA). This EUA will remain in effect (meaning this test can be used) for the duration of the COVID-19 declaration under Section 564(b)(1) of the Act, 21 U.S.C. section 360bbb-3(b)(1), unless the authorization is terminated or revoked.  Performed at [facility], [address], [place], [place] [address]     Radiology Studies: DG CHEST PORT 1 VIEW Result Date: 12/21/2023 CLINICAL DATA:  Shortness of breath EXAM: PORTABLE CHEST 1 VIEW COMPARISON:  X-ray 12/14/2023 FINDINGS: Feeding tube is seen overlying the mid mediastinum. This has been retracted. No pneumothorax, effusion or edema. Normal cardiopericardial silhouette. Calcified aorta. Improving lung base opacity. Overlapping cardiac leads. IMPRESSION: Retraction of feeding tube now with tip along the mid mediastinum. Critical Value/emergent results were called by telephone at the time of interpretation on 12/21/2023 at 9:38 am to provider  [facility] , who verbally acknowledged these  results. Electronically Signed   By: Karen Kays M.D.   On: 12/21/2023 09:44   Scheduled Meds:  amantadine  100 mg Per Tube BID   amLODipine  5 mg Per Tube Daily   atorvastatin  40 mg Per Tube Daily   feeding supplement (PROSource TF20)  60 mL Per Tube Daily   free water  200 mL Per Tube Q4H   heparin injection (subcutaneous)  5,000 Units Subcutaneous Q12H   insulin aspart  0-15 Units Subcutaneous Q4H   insulin aspart  8 Units Subcutaneous Q4H   insulin glargine  10 Units Subcutaneous Daily   [START ON 12/22/2023] iohexol  150 mL Oral Once   metoCLOPramide  10 mg Per Tube TID AC & HS   metoprolol tartrate  50 mg Per Tube BID   multivitamin with minerals  1 tablet Per Tube Daily   mouth rinse  15 mL Mouth Rinse 4 times per day   pantoprazole (PROTONIX) IV  40 mg Intravenous Q12H   sodium chloride flush  3 mL Intravenous Q12H   Continuous Infusions:  chlorproMAZINE (THORAZINE) 12.5 mg in sodium chloride 0.9 % 25 mL IVPB 50 mL/hr at 12/19/23 1000   feeding supplement (OSMOLITE 1.5 CAL) Stopped (12/21/23 1003)    LOS: 21 days   Marguerita Merles, DO [facility] Available via Epic secure chat 7am-7pm After these hours, please refer to coverage provider listed on [email or web address] 12/21/2023, 5:27 PM

## 2023-12-21 NOTE — Progress Notes (Signed)
 TF stopped  per MD request and Xray placement at this time until further instructions.

## 2023-12-22 ENCOUNTER — Inpatient Hospital Stay (HOSPITAL_COMMUNITY)

## 2023-12-22 DIAGNOSIS — I619 Nontraumatic intracerebral hemorrhage, unspecified: Secondary | ICD-10-CM | POA: Diagnosis not present

## 2023-12-22 DIAGNOSIS — G9341 Metabolic encephalopathy: Secondary | ICD-10-CM | POA: Diagnosis not present

## 2023-12-22 DIAGNOSIS — I609 Nontraumatic subarachnoid hemorrhage, unspecified: Secondary | ICD-10-CM | POA: Diagnosis not present

## 2023-12-22 DIAGNOSIS — N179 Acute kidney failure, unspecified: Secondary | ICD-10-CM | POA: Diagnosis not present

## 2023-12-22 LAB — CBC WITH DIFFERENTIAL/PLATELET
Abs Immature Granulocytes: 0.1 10*3/uL — ABNORMAL HIGH (ref 0.00–0.07)
Basophils Absolute: 0 10*3/uL (ref 0.0–0.1)
Basophils Relative: 0 %
Eosinophils Absolute: 0 10*3/uL (ref 0.0–0.5)
Eosinophils Relative: 0 %
HCT: 23.9 % — ABNORMAL LOW (ref 39.0–52.0)
Hemoglobin: 7.3 g/dL — ABNORMAL LOW (ref 13.0–17.0)
Immature Granulocytes: 1 %
Lymphocytes Relative: 5 %
Lymphs Abs: 0.5 10*3/uL — ABNORMAL LOW (ref 0.7–4.0)
MCH: 29.9 pg (ref 26.0–34.0)
MCHC: 30.5 g/dL (ref 30.0–36.0)
MCV: 98 fL (ref 80.0–100.0)
Monocytes Absolute: 0.5 10*3/uL (ref 0.1–1.0)
Monocytes Relative: 5 %
Neutro Abs: 9.5 10*3/uL — ABNORMAL HIGH (ref 1.7–7.7)
Neutrophils Relative %: 89 %
Platelets: 201 10*3/uL (ref 150–400)
RBC: 2.44 MIL/uL — ABNORMAL LOW (ref 4.22–5.81)
RDW: 16.8 % — ABNORMAL HIGH (ref 11.5–15.5)
WBC: 10.7 10*3/uL — ABNORMAL HIGH (ref 4.0–10.5)
nRBC: 0 % (ref 0.0–0.2)

## 2023-12-22 LAB — GLUCOSE, CAPILLARY
Glucose-Capillary: 138 mg/dL — ABNORMAL HIGH (ref 70–99)
Glucose-Capillary: 133 mg/dL — ABNORMAL HIGH (ref 70–99)
Glucose-Capillary: 107 mg/dL — ABNORMAL HIGH (ref 70–99)
Glucose-Capillary: 131 mg/dL — ABNORMAL HIGH (ref 70–99)
Glucose-Capillary: 200 mg/dL — ABNORMAL HIGH (ref 70–99)
Glucose-Capillary: 117 mg/dL — ABNORMAL HIGH (ref 70–99)

## 2023-12-22 LAB — COMPREHENSIVE METABOLIC PANEL
ALT: 96 U/L — ABNORMAL HIGH (ref 0–44)
AST: 57 U/L — ABNORMAL HIGH (ref 15–41)
Albumin: 1.9 g/dL — ABNORMAL LOW (ref 3.5–5.0)
Alkaline Phosphatase: 115 U/L (ref 38–126)
Anion gap: 9 (ref 5–15)
BUN: 29 mg/dL — ABNORMAL HIGH (ref 8–23)
CO2: 26 mmol/L (ref 22–32)
Calcium: 8.9 mg/dL (ref 8.9–10.3)
Chloride: 113 mmol/L — ABNORMAL HIGH (ref 98–111)
Creatinine, Ser: 1.47 mg/dL — ABNORMAL HIGH (ref 0.61–1.24)
GFR, Estimated: 50 mL/min — ABNORMAL LOW (ref >60)
Glucose, Bld: 133 mg/dL — ABNORMAL HIGH (ref 70–99)
Potassium: 4.3 mmol/L (ref 3.5–5.1)
Sodium: 148 mmol/L — ABNORMAL HIGH (ref 135–145)
Total Bilirubin: 0.8 mg/dL (ref 0.0–1.2)
Total Protein: 5.8 g/dL — ABNORMAL LOW (ref 6.5–8.1)

## 2023-12-22 LAB — MAGNESIUM: Magnesium: 2.2 mg/dL (ref 1.7–2.4)

## 2023-12-22 LAB — PHOSPHORUS: Phosphorus: 3.6 mg/dL (ref 2.5–4.6)

## 2023-12-22 MED ORDER — SODIUM CHLORIDE 0.9 % IV SOLN
3.0000 g | Freq: Four times a day (QID) | INTRAVENOUS | Status: AC
  Administered 2023-12-22 – 2023-12-26 (×17): 3 g via INTRAVENOUS
  Filled 2023-12-22 (×17): qty 8

## 2023-12-22 NOTE — Progress Notes (Signed)
 PROGRESS NOTE    William Todd  LKG:[id number] DOB: Aug 26, 1951 DOA: 11/29/2023 PCP: Knox Royalty, MD   Brief Narrative:  This 73 year old Male with past medical history significant for hypertension, recurrents mechanical fall, protein caloric malnutrition, diabetes type 2, chronic kidney disease, prostate cancer 2014 who presents via EMS from [facility] after suffering ground-level fall at [facility].  Workup on admission revealed acute intraparenchymal hemorrhage of the left frontal lobe with multiple bilateral small volume SAH.  Neurosurgery was consulted with no surgical intervention recommended.  While boarding in the ED patient had repeat CT which showed ICH revealed progressive left frontal hematoma with intraventricular extension into the fourth ventricle resulting in anterior midline shift of 5 mm, no hydrocephalus.  Neurosurgery reviewed imaging and recommended admission to the ICU under CCM for close monitoring.   2/15 presented after ground-level fall from SNF workup revealed ICH with bilateral [facility] 2/16 repeat CT while boarding in ED revealed progressive left frontal hematoma with intraventricular extension into the fourth ventricle resulting in anterior midline shift of 5 mm, no hydrocephalus.  Admission changed to ICU 2/18-CT angio, CT head with enlarging clot 2/20 - Stable left frontal hematoma with mild mass effect. 2/26: At this time bleeding has been stable for over a week.  Neurosurgery signed off. 3/1: Code status changed to DNR/DNI. Family at bed side.  Spiked a Temp and likely from Aspiration and now improved ands s/p IV Unasyn now will resume now that his Cortrak was in the L Mainstem Bronchus and malpositioned. Palliative Consulted and having GOC Discussions.   C/w SLP and have IR evaluate for PEG given that he is likely not to pass his Swallow Test.  SLP reevaluated given his increased alertness but they feel that he is continuing to be unsafe for p.o. intake and recommending  continuing to follow given his high risk of aspiration.  Will now pursue PEG tube and this is to be placed on Tuesday, 12/23/2023 since Cortrak is properly positioned and will be getting oral contrast placed today.  Assessment and Plan:  Left Frontal Intracerebral Traumatic Hematoma: Patient presented with B/L SAH sec.to trauma. Status post 7 days of prophylactic Keppra. CT head 12/16 left frontal small ICH with bilateral small SAH. Traumatic contusion. Repeat CT 2/16 showed significant and large hematoma of the left frontal area with multilobe configuration and IVH and SAH, 5 mm midline shift. CTA head and neck: 60% right ICA involved bilateral VA moderate to severe stenosis, moderate left VA origin stenosis and mild to moderate bilateral ICA; MRI with and without contrast unchanged size of large left frontal intraparenchymal hematoma with 4 mm anterior rightward midline shift.  Subarachnoid blood over both hemisphere. EEG showed left frontal sharp waves, moderate diffuse encephalopathy, no seizure. Patient was on aspirin prior to admission,  now off, No antithrombotic due to ICH. At this time bleeding has been stable for over a week.  Neurosurgery signed off. Still not been able to swallow. SLP signed off but will re-consult. Plan is for PEG tube now as doubt he will be able to participate in SLP evaluation.  Family is agreeable and undergoing Palliative Care discussions.  -IR consulted for PEG tube placement; CT Abd/Pelvis w/o Contrast to evaluate anatomy for PEG placement and this was equivocal for percutaneous gastrostomy and the recommendation from the radiologist was a trial of contrast opacification of the colon and insufflation recommended.  Interventional radiology is ordering contrast through his Cortrak Tube today now that it has been adjusted and replaced. **(  The tube feedings to be held at midnight tonight and have held the heparin in the morning and for the patient on Ancef in the signed and held  orders.) plan is for PEG tube placement   Cerebral Edema: Repeat CT 2/17, 5 mm rightward  midline shift. CT 2/18: MLS  7/38mm MRI: 2/22:MLS 4mm. Sodium improved.    Aspiration Pneumonia:  Continued to spike fevers but now improving. Initial Chest x-ray concerning for aspiration pneumonia, S/p IV Unasyn x 5 days and ID consultation but will resume IV Unasyn for another 5 Days given that Cortrak was in the Left Lung Bronchus. COVID, RSV, influenza negative. C/w Pulmonary Toileting and add Xopenex/Atrovent. WBC now 9.1. Repeat CXR this AM showed the tip of the  Cortrak was within the distal left main bronchus within the mid mediastinum.  No other acute cardiopulmonary abnormalities were noted.  WBC is slowly trending up now but will empirically treat again   Hypertension: Goal is normotensive. Continue Amlodipine 5 mg po Daily and Metoprolol Tartrate 50 mg per Tube BID.CTM BP per protocol. Last BP reading was a little elevated at 154/87   Diabetes Mellitus Type 2: C/w Moderate Novolog SSI AC, Insulin Glargine 10 units sq Daily, and 5 units tube feeding coverage every 4 hours increased to 8 units. CBGs ranging from 42-171 the last 5 checks; will need to hold his sliding scale coverage given that his tube feedings have been stopped due to misplacement of his Cortrak   History of frailty with recurrent mechanical falls: PT/OT > SNF  HyperNa+: Mild worsened in the setting of not having access and no intake. Na+ is 148. D5 in LR@40  mL/hr now resumed.C/w  FW Flushes of 200 mL q4h for now. CTM and Trend and repeat CMP in the AM  AKI on CKD Stage 3a: BUN/Cr had worsened and peaked at 62/1.92 Resume IVF with D5 in LR @ 50 mL/hr and BUN/Cr is now trending back up as it went from 26/1.32 -> 29/1.47. Avoid Nephrotoxic Medications, Contrast Dyes (if possible), Hypotension and Dehydration to Ensure Adequate Renal Perfusion and will need to Renally Adjust Meds. CTM and Trend Renal Function carefully and repeat CMP in  the AM   Abnormal LFTs: Slowly improving now. AST has gone from 132 -> 100 -> 75  -> 73 -> 75 -> 57 and ALT has gone from 143 -> 132 -> 121 -> 114 -> 110 -> 96.  Continue to monitor and trend and repeat CMP in a.m. and if necessary will obtain right upper quadrant ultrasound but they are slowly trending down  Normocytic Anemia: Was Trending down. H/H now slowly improving and is is now 7.3/23.9 and relatively stable.  Anemia panel was checked and showed an iron level of 32, UIBC 130, TIBC of 162, saturation ratios of 20%, ferritin 566, folate of 1.6 and a vitamin B12 874. CTM for S/Sx of Bleeding. May need pRBC transfusion. Repeat CBC in the AM  GERD/GI Prophylaxis: C/w IV PPI with Pantoprazole 40 mg q12h  Hypoalbuminemia: Albumin Level is again 1.9. CTM and Trend and repeat CMP in the AM  Pressure Injury, poA Pressure Injury 11/30/23 Hip Left Stage 2 -  Partial thickness loss of dermis presenting as a shallow open injury with a red, pink wound bed without slough. (Active)  11/30/23 2000  Location: Hip  Location Orientation: Left  Staging: Stage 2 -  Partial thickness loss of dermis presenting as a shallow open injury with a red, pink wound bed without slough.  Wound Description (Comments):   Present on Admission:      Pressure Injury 12/01/23 Knee Left;Posterior Stage 3 -  Full thickness tissue loss. Subcutaneous fat may be visible but bone, tendon or muscle are NOT exposed. (Active)  12/01/23 2000  Location: Knee  Location Orientation: Left;Posterior  Staging: Stage 3 -  Full thickness tissue loss. Subcutaneous fat may be visible but bone, tendon or muscle are NOT exposed.  Wound Description (Comments):   Present on Admission:      Pressure Injury 12/14/23 Foot Left;Lateral Unstageable - Full thickness tissue loss in which the base of the injury is covered by slough (yellow, tan, gray, green or brown) and/or eschar (tan, brown or black) in the wound bed. left lateral foot (Active)   12/14/23 0730  Location: Foot  Location Orientation: Left;Lateral (Left Lateral Foot)  Staging: Unstageable - Full thickness tissue loss in which the base of the injury is covered by slough (yellow, tan, gray, green or brown) and/or eschar (tan, brown or black) in the wound bed.  Wound Description (Comments): left lateral foot  Present on Admission:      Pressure Injury 12/19/23 Sacrum Bilateral Stage 2 -  Partial thickness loss of dermis presenting as a shallow open injury with a red, pink wound bed without slough. (Active)  12/19/23 0800  Location: Sacrum  Location Orientation: Bilateral  Staging: Stage 2 -  Partial thickness loss of dermis presenting as a shallow open injury with a red, pink wound bed without slough.  Wound Description (Comments):   Present on Admission: No   Severe Malnutrition in the context of Chronic illness: Nutrition Problem: Severe Malnutrition Etiology: chronic illness Signs/Symptoms: severe fat depletion, severe muscle depletion, percent weight loss Interventions: Tube feeding, MVI, Refer to RD note for recommendations; feedings were held yesterday given that the tube was not in the right place. Cortrak has been replaced   DVT prophylaxis: heparin injection 5,000 Units Start: 12/06/23 1000 SCDs Start: 11/30/23 0334 Place TED hose Start: 11/30/23 0334    Code Status: Limited: Do not attempt resuscitation (DNR) -DNR-LIMITED -Do Not Intubate/DNI  Family Communication: Discussed with wife at bedside  Disposition Plan:  Level of care: Progressive Status is: Inpatient Remains inpatient appropriate because: Need PEG and further clinical Improvement. Patient to go to SNF when medically stable   Consultants:  Neurosurgery Neurology ID Palliative Care Medicine Interventional Radiology  Procedures:  As delineated as above  Antimicrobials:  Anti-infectives (From admission, onward)    Start     Dose/Rate Route Frequency Ordered Stop   12/15/23 1200   Ampicillin-Sulbactam (UNASYN) 3 g in sodium chloride 0.9 % 100 mL IVPB        3 g 200 mL/hr over 30 Minutes Intravenous Every 6 hours 12/15/23 1018 12/20/23 0551   12/15/23 0845  ceFEPIme (MAXIPIME) 2 g in sodium chloride 0.9 % 100 mL IVPB  Status:  Discontinued        2 g 200 mL/hr over 30 Minutes Intravenous Every 12 hours 12/15/23 0745 12/15/23 1018   12/14/23 1515  Ampicillin-Sulbactam (UNASYN) 3 g in sodium chloride 0.9 % 100 mL IVPB  Status:  Discontinued        3 g 200 mL/hr over 30 Minutes Intravenous Every 6 hours 12/14/23 1422 12/15/23 0745       Subjective: Seen and examined at bedside he is not as interactive today compared to yesterday.  Today x-ray shows that the Cortrak is in the Distal L Mainstem Bronchus.  This will be  removed and replaced.  Current plan is for possible PEG placement on Tuesday, 12/23/2023.  Objective: Vitals:   12/22/23 0409 12/22/23 1222 12/22/23 1307 12/22/23 1627  BP:  (!) 145/87 (!) 145/87 (!) 145/76  Pulse:  100 100 (!) 108  Resp:  18  18  Temp:  99 F (37.2 C)  98.5 F (36.9 C)  TempSrc:  Oral  Oral  SpO2:  100%  100%  Weight: 62.9 kg     Height:        Intake/Output Summary (Last 24 hours) at 12/22/2023 1635 Last data filed at 12/22/2023 1606 Gross per 24 hour  Intake 543.43 ml  Output 1530 ml  Net -986.57 ml   Filed Weights   12/20/23 0405 12/21/23 0458 12/22/23 0409  Weight: 63 kg 63.3 kg 62.9 kg   Examination: Physical Exam:  Constitutional: Thin chronically ill-appearing African-American male who is a little bit more lethargic and does not follow commands  Respiratory: Diminished to auscultation bilaterally with some coarse and slightly rhonchorous breath sounds.  No appreciable wheezing or rales or rhonchi. Normal respiratory effort and patient is not tachypenic. No accessory muscle use.  Cardiovascular: RRR, no murmurs / rubs / gallops. S1 and S2 auscultated. No extremity edema.  Abdomen: Soft, non-tender, non-distended. No  masses palpated. No appreciable hepatosplenomegaly. Bowel sounds positive.  GU: Deferred. Musculoskeletal: No clubbing / cyanosis of digits/nails. No joint deformity upper and lower extremities. Skin: No rashes, lesions, ulcers on limited skin evaluation. No induration; Warm and dry.  Neurologic: Lethargic and not as awake or alert today Psychiatric: Appears calm and resting  Data Reviewed: I have personally reviewed following labs and imaging studies  CBC: Recent Labs  Lab 12/18/23 0700 12/19/23 0711 12/20/23 0922 12/21/23 0729 12/22/23 0714  WBC 8.7 9.1 8.7 9.4 10.7*  NEUTROABS 7.5 7.8* 7.7 8.1* 9.5*  HGB 7.0* 7.3* 7.6* 7.3* 7.3*  HCT 22.4* 24.4* 24.7* 23.8* 23.9*  MCV 97.8 98.0 97.6 97.9 98.0  PLT 188 199 197 208 201   Basic Metabolic Panel: Recent Labs  Lab 12/18/23 0700 12/19/23 0711 12/20/23 0922 12/21/23 0729 12/22/23 0714  NA 142 143 144 143 148*  K 3.5 3.9 4.4 4.3 4.3  CL 115* 113* 109 112* 113*  CO2 24 26 24 25 26   GLUCOSE 92 189* 240* 139* 133*  BUN 45* 34* 25* 26* 29*  CREATININE 1.61* 1.48* 1.27* 1.32* 1.47*  CALCIUM 7.1* 7.7* 8.3* 8.3* 8.9  MG 2.6* 2.4 2.1 2.2 2.2  PHOS 2.5 3.0 3.3 3.0 3.6   GFR: Estimated Creatinine Clearance: 39.8 mL/min (A) (by C-G formula based on SCr of 1.47 mg/dL (H)). Liver Function Tests: Recent Labs  Lab 12/18/23 0700 12/19/23 0711 12/20/23 0922 12/21/23 0729 12/22/23 0714  AST 100* 75* 73* 75* 57*  ALT 132* 121* 114* 110* 96*  ALKPHOS 122 112 117 112 115  BILITOT 0.4 0.4 0.6 0.6 0.8  PROT 5.2* 5.1* 5.4* 5.6* 5.8*  ALBUMIN 1.8* 1.8* 1.9* 1.9* 1.9*   No results for input(s): "LIPASE", "AMYLASE" in the last 168 hours. No results for input(s): "AMMONIA" in the last 168 hours. Coagulation Profile: Recent Labs  Lab 12/21/23 1509  INR 1.3*   Cardiac Enzymes: No results for input(s): "CKTOTAL", "CKMB", "CKMBINDEX", "TROPONINI" in the last 168 hours. BNP (last 3 results) No results for input(s): "PROBNP" in the  last 8760 hours. HbA1C: No results for input(s): "HGBA1C" in the last 72 hours. CBG: Recent Labs  Lab 12/21/23 2332 12/22/23 0358 12/22/23 3244 12/22/23  1224 12/22/23 1628  GLUCAP 171* 138* 133* 107* 131*   Lipid Profile: No results for input(s): "CHOL", "HDL", "LDLCALC", "TRIG", "CHOLHDL", "LDLDIRECT" in the last 72 hours. Thyroid Function Tests: No results for input(s): "TSH", "T4TOTAL", "FREET4", "T3FREE", "THYROIDAB" in the last 72 hours. Anemia Panel: Recent Labs    12/21/23 0729  VITAMINB12 874  FOLATE 11.6  FERRITIN 566*  TIBC 162*  IRON 32*  RETICCTPCT 4.2*   Sepsis Labs: No results for input(s): "PROCALCITON", "LATICACIDVEN" in the last 168 hours.  Recent Results (from the past 240 hours)  Culture, blood (single) w Reflex to ID Panel     Status: None   Collection Time: 12/14/23  9:48 PM   Specimen: BLOOD LEFT ARM  Result Value Ref Range Status   Specimen Description BLOOD LEFT ARM  Final   Special Requests   Final    BOTTLES DRAWN AEROBIC AND ANAEROBIC Blood Culture results may not be optimal due to an inadequate volume of blood received in culture bottles   Culture   Final    NO GROWTH 5 DAYS Performed at [facility], [address] [place], [place] [address]    Report Status 12/19/2023 FINAL  Final  Resp panel by RT-PCR (RSV, Flu A&B, Covid) Anterior Nasal Swab     Status: None   Collection Time: 12/15/23  8:07 AM   Specimen: Anterior Nasal Swab  Result Value Ref Range Status   SARS Coronavirus 2 by RT PCR NEGATIVE NEGATIVE Final   Influenza A by PCR NEGATIVE NEGATIVE Final   Influenza B by PCR NEGATIVE NEGATIVE Final    Comment: (NOTE) The Xpert Xpress SARS-CoV-2/FLU/RSV plus assay is intended as an aid in the diagnosis of influenza from Nasopharyngeal swab specimens and should not be used as a sole basis for treatment. Nasal washings and aspirates are unacceptable for Xpert Xpress SARS-CoV-2/FLU/RSV testing.  Fact Sheet for  Patients: BloggerCourse.com  Fact Sheet for Healthcare Providers: SeriousBroker.it  This test is not yet approved or cleared by the [place] FDA and has been authorized for detection and/or diagnosis of SARS-CoV-2 by FDA under an Emergency Use Authorization (EUA). This EUA will remain in effect (meaning this test can be used) for the duration of the COVID-19 declaration under Section 564(b)(1) of the Act, 21 U.S.C. section 360bbb-3(b)(1), unless the authorization is terminated or revoked.     Resp Syncytial Virus by PCR NEGATIVE NEGATIVE Final    Comment: (NOTE) Fact Sheet for Patients: BloggerCourse.com  Fact Sheet for Healthcare Providers: SeriousBroker.it  This test is not yet approved or cleared by the [place] FDA and has been authorized for detection and/or diagnosis of SARS-CoV-2 by FDA under an Emergency Use Authorization (EUA). This EUA will remain in effect (meaning this test can be used) for the duration of the COVID-19 declaration under Section 564(b)(1) of the Act, 21 U.S.C. section 360bbb-3(b)(1), unless the authorization is terminated or revoked.  Performed at [facility], [address], [place], [place] [address]     Radiology Studies: DG CHEST PORT 1 VIEW Addendum Date: 12/22/2023 ADDENDUM REPORT: 12/22/2023 08:57 ADDENDUM: These results were called by telephone at the time of interpretation on 12/22/2023 at 8:56 am to provider [facility] , who verbally acknowledged these results. Electronically Signed   By: Signa Kell M.D.   On: 12/22/2023 08:57   Result Date: 12/22/2023 CLINICAL DATA:  Shortness of breath EXAM: PORTABLE CHEST 1 VIEW COMPARISON:  12/21/2023 FINDINGS: The enteric tube tip is now  within the mid mediastinum in the projection of the distal left mainstem bronchus. This is similar in position to the previous exam. Heart size is  normal. Aortic atherosclerotic calcifications. No pleural fluid, interstitial edema or airspace disease. Scar versus atelectasis within the periphery of the left lower lobe. IMPRESSION: 1. Enteric tube tip is now within the mid mediastinum in the projection of the distal left mainstem bronchus. This is similar in position to the previous exam. Recommend repositioning. 2. No acute cardiopulmonary abnormalities. Electronically Signed: By: Signa Kell M.D. On: 12/22/2023 08:49   DG CHEST PORT 1 VIEW Result Date: 12/21/2023 CLINICAL DATA:  Shortness of breath EXAM: PORTABLE CHEST 1 VIEW COMPARISON:  X-ray 12/14/2023 FINDINGS: Feeding tube is seen overlying the mid mediastinum. This has been retracted. No pneumothorax, effusion or edema. Normal cardiopericardial silhouette. Calcified aorta. Improving lung base opacity. Overlapping cardiac leads. IMPRESSION: Retraction of feeding tube now with tip along the mid mediastinum. Critical Value/emergent results were called by telephone at the time of interpretation on 12/21/2023 at 9:38 am to provider [facility] , who verbally acknowledged these results. Electronically Signed   By: Karen Kays M.D.   On: 12/21/2023 09:44   Scheduled Meds:  amantadine  100 mg Per Tube BID   amLODipine  5 mg Per Tube Daily   atorvastatin  40 mg Per Tube Daily   feeding supplement (PROSource TF20)  60 mL Per Tube Daily   free water  200 mL Per Tube Q4H   heparin injection (subcutaneous)  5,000 Units Subcutaneous Q12H   insulin aspart  0-15 Units Subcutaneous Q4H   insulin aspart  8 Units Subcutaneous Q4H   insulin glargine  10 Units Subcutaneous Daily   iohexol  150 mL Oral Once   metoCLOPramide  10 mg Per Tube TID AC & HS   metoprolol tartrate  50 mg Per Tube BID   multivitamin with minerals  1 tablet Per Tube Daily   mouth rinse  15 mL Mouth Rinse 4 times per day   pantoprazole (PROTONIX) IV  40 mg Intravenous Q12H   sodium chloride flush  3 mL Intravenous Q12H    Continuous Infusions:  chlorproMAZINE (THORAZINE) 12.5 mg in sodium chloride 0.9 % 25 mL IVPB 50 mL/hr at 12/22/23 1606   dextrose 5% lactated ringers 40 mL/hr at 12/22/23 1606   feeding supplement (OSMOLITE 1.5 CAL) 1,000 mL (12/22/23 1606)    LOS: 22 days   Marguerita Merles, DO [facility] Available via Epic secure chat 7am-7pm After these hours, please refer to coverage provider listed on [email or web address] 12/22/2023, 4:35 PM

## 2023-12-22 NOTE — Procedures (Signed)
 Cortrak  Tube Type:  Cortrak - 43 inches Tube Location:  Left nare Initial Placement:  Stomach Secured by: Bridle Technique Used to Measure Tube Placement:  Marking at nare/corner of mouth Cortrak Secured At:  71 cm   Cortrak Tube Team Note:  Consult received to place a Cortrak feeding tube.   No x-ray is required. RN may begin using tube.   If the tube becomes dislodged please keep the tube and contact the Cortrak team at [email or web address] for replacement.  If after hours and replacement cannot be delayed, place a NG tube and confirm placement with an abdominal x-ray.    Betsey Holiday MS, RD, LDN If unable to be reached, please send secure chat to "RD inpatient" available from 8:00a-4:00p daily

## 2023-12-22 NOTE — Progress Notes (Signed)
 Nutrition Follow-up  DOCUMENTATION CODES:  Severe malnutrition in context of chronic illness  INTERVENTION:  Continue tube feeding via Cortrak: Osmolite 1.5 at 50 ml/h (1200 ml per day) Prosource TF20 60 ml daily Free Water Flushes Q4 hours Provides 1880 kcal, 95 gm protein, free water daily  (TF+flush)  MVI w/ minerals Continue bowel regimen  Monitor SLP notes for advancement in oral diet  NUTRITION DIAGNOSIS:  Severe Malnutrition related to chronic illness as evidenced by severe fat depletion, severe muscle depletion, percent weight loss. - Still relevant  GOAL:  Patient will meet greater than or equal to 90% of their needs - Meeting with tube feeds  MONITOR:  Diet advancement, TF tolerance, Weight trends, Labs, Skin  REASON FOR ASSESSMENT:  Consult Enteral/tube feeding initiation and management, Assessment of nutrition requirement/status  ASSESSMENT:  Pt recently discharged on 2/6 after mechanical fall at home resulting in L femoral neck fracture requiring L hip arthroplasty. Discharged to [facility] where he fell again resulting in ICH. PMH: CAD, HLD, T2DM, HTN, anxiety, and prostate cancer (radiation 09/29/13-11/25/13). No formal dx of dementia, however with poor short term memory.  2/16 admitted to [facility] w/ ICH with bilateral [facility] 2/17 Cortrak placed/TF initiated/ pt pulled out Cortrak 2/19 Cortrak replaced/TF re-initiated/EEG 2/25 transferred out of ICU  3/7 - SLP continues to recommend NPO  Pt resting in bed at the time of assessment. Just underwent cortrak replacement. Scheduled to get PEG tomorrow after contrast administration. Pt does not interact.   Noted that GOC conversations ongoing and PMT meeting with wife today.   Admit weight: 60 kg Current weight: 62.9 kg   Nutritionally Relevant Medications: Scheduled Meds:  amantadine  100 mg Per Tube BID   atorvastatin  40 mg Per Tube Daily   PROSource TF20  60 mL Per Tube Daily   free water  200 mL  Per Tube Q4H   insulin aspart  0-15 Units Subcutaneous Q4H   insulin aspart  8 Units Subcutaneous Q4H   insulin glargine  10 Units Subcutaneous Daily   metoCLOPramide  10 mg Per Tube TID AC & HS   multivitamin with minerals  1 tablet Per Tube Daily   pantoprazole IV  40 mg Intravenous Q12H   Continuous Infusions:  chlorproMAZINE (THORAZINE) 12.5 mg in sodium chloride 0.9 % 25 mL IVPB 50 mL/hr at 12/22/23 1300   dextrose 5% lactated ringers 40 mL/hr at 12/22/23 1300   feeding supplement (OSMOLITE 1.5 CAL) Stopped (12/21/23 1003)   PRN Meds: chlorproMAZINE, ondansetron  Labs Reviewed: Na 148, chloride 113 BUN 23, creatinine 1.47 CBG ranges from 42-171 mg/dL over the last 24 hours HgbA1c 6.1% (2/18)   Diet Order:   Diet Order             Diet NPO time specified  Diet effective now                  EDUCATION NEEDS:  Not appropriate for education at this time  Skin:  Skin Assessment: Skin Integrity Issues: Skin Integrity Issues:: Stage III, Stage II Stage II: L hip arthroplasty site Stage III: L posterior knee  Last BM:  3/10 - type 5  Height:  Ht Readings from Last 1 Encounters:  12/07/23 6' (1.829 m)   Weight:  Wt Readings from Last 1 Encounters:  12/22/23 62.9 kg   Ideal Body Weight:  80.9 kg  BMI:  Body mass index is 18.81 kg/m.  Estimated Nutritional Needs:  Kcal:  1800-2000kcal Protein:  90-100g Fluid:  >1.8L/day    [name], RD, LDN Registered Dietitian II Please reach out via secure chat Weekend on-call pager # available in [facility]

## 2023-12-22 NOTE — Plan of Care (Signed)
  Problem: Education: Goal: Ability to describe self-care measures that may prevent or decrease complications (Diabetes Survival Skills Education) will improve Outcome: Progressing   Problem: Coping: Goal: Ability to adjust to condition or change in health will improve Outcome: Progressing   Problem: Fluid Volume: Goal: Ability to maintain a balanced intake and output will improve Outcome: Progressing   Problem: Health Behavior/Discharge Planning: Goal: Ability to identify and utilize available resources and services will improve Outcome: Progressing Goal: Ability to manage health-related needs will improve Outcome: Progressing   Problem: Metabolic: Goal: Ability to maintain appropriate glucose levels will improve Outcome: Progressing   Problem: Nutritional: Goal: Maintenance of adequate nutrition will improve Outcome: Progressing   Problem: Skin Integrity: Goal: Risk for impaired skin integrity will decrease Outcome: Progressing   Problem: Education: Goal: Knowledge of General Education information will improve Description: Including pain rating scale, medication(s)/side effects and non-pharmacologic comfort measures Outcome: Progressing

## 2023-12-22 NOTE — Progress Notes (Signed)
 Pharmacy Antibiotic Note  AZARIAS CHIOU is a 73 y.o. male admitted on 11/29/2023 with pneumonia.  Pharmacy has been consulted for unasyn dosing.  Plan: Unasyn 3G IV q6 hours Monitor clinical progression and cultures  Height: 6' (182.9 cm) Weight: 62.9 kg (138 lb 10.7 oz) IBW/kg (Calculated) : 77.6  Temp (24hrs), Avg:98.6 F (37 C), Min:98.2 F (36.8 C), Max:99 F (37.2 C)  Recent Labs  Lab 12/18/23 0700 12/19/23 0711 12/20/23 0922 12/21/23 0729 12/22/23 0714  WBC 8.7 9.1 8.7 9.4 10.7*  CREATININE 1.61* 1.48* 1.27* 1.32* 1.47*    Estimated Creatinine Clearance: 39.8 mL/min (A) (by C-G formula based on SCr of 1.47 mg/dL (H)).    No Known Allergies   Thank you for allowing pharmacy to be a part of this patient's care.  Toniann Fail Jonni Oelkers 12/22/2023 4:51 PM

## 2023-12-22 NOTE — Plan of Care (Signed)
  Problem: Fluid Volume: Goal: Ability to maintain a balanced intake and output will improve Outcome: Progressing   Problem: Metabolic: Goal: Ability to maintain appropriate glucose levels will improve Outcome: Progressing   Problem: Nutritional: Goal: Maintenance of adequate nutrition will improve Outcome: Progressing   Problem: Skin Integrity: Goal: Risk for impaired skin integrity will decrease Outcome: Progressing   Problem: Clinical Measurements: Goal: Will remain free from infection Outcome: Progressing   Problem: Clinical Measurements: Goal: Respiratory complications will improve Outcome: Progressing   Problem: Activity: Goal: Risk for activity intolerance will decrease Outcome: Progressing   Problem: Safety: Goal: Ability to remain free from injury will improve Outcome: Progressing

## 2023-12-22 NOTE — Progress Notes (Signed)
 Pt is hypoglycemic at 42, given Dextrose 50% as per hyperglycemia protocol, repeated CBG after 15 mins, 172. Informed Dr. Joneen Roach, to srtart IV fluids as ordered.

## 2023-12-23 ENCOUNTER — Inpatient Hospital Stay (HOSPITAL_COMMUNITY)

## 2023-12-23 DIAGNOSIS — I609 Nontraumatic subarachnoid hemorrhage, unspecified: Secondary | ICD-10-CM | POA: Diagnosis not present

## 2023-12-23 DIAGNOSIS — G9341 Metabolic encephalopathy: Secondary | ICD-10-CM | POA: Diagnosis not present

## 2023-12-23 DIAGNOSIS — N179 Acute kidney failure, unspecified: Secondary | ICD-10-CM | POA: Diagnosis not present

## 2023-12-23 DIAGNOSIS — I619 Nontraumatic intracerebral hemorrhage, unspecified: Secondary | ICD-10-CM | POA: Diagnosis not present

## 2023-12-23 HISTORY — PX: IR GASTROSTOMY TUBE MOD SED: IMG625

## 2023-12-23 LAB — COMPREHENSIVE METABOLIC PANEL
ALT: 95 U/L — ABNORMAL HIGH (ref 0–44)
AST: 61 U/L — ABNORMAL HIGH (ref 15–41)
Albumin: 2 g/dL — ABNORMAL LOW (ref 3.5–5.0)
Alkaline Phosphatase: 114 U/L (ref 38–126)
Anion gap: 7 (ref 5–15)
BUN: 21 mg/dL (ref 8–23)
CO2: 25 mmol/L (ref 22–32)
Calcium: 8.7 mg/dL — ABNORMAL LOW (ref 8.9–10.3)
Chloride: 115 mmol/L — ABNORMAL HIGH (ref 98–111)
Creatinine, Ser: 1.43 mg/dL — ABNORMAL HIGH (ref 0.61–1.24)
GFR, Estimated: 52 mL/min — ABNORMAL LOW (ref >60)
Glucose, Bld: 131 mg/dL — ABNORMAL HIGH (ref 70–99)
Potassium: 3.9 mmol/L (ref 3.5–5.1)
Sodium: 147 mmol/L — ABNORMAL HIGH (ref 135–145)
Total Bilirubin: 0.6 mg/dL (ref 0.0–1.2)
Total Protein: 5.8 g/dL — ABNORMAL LOW (ref 6.5–8.1)

## 2023-12-23 LAB — CBC WITH DIFFERENTIAL/PLATELET
Abs Immature Granulocytes: 0.16 10*3/uL — ABNORMAL HIGH (ref 0.00–0.07)
Basophils Absolute: 0 10*3/uL (ref 0.0–0.1)
Basophils Relative: 0 %
Eosinophils Absolute: 0 10*3/uL (ref 0.0–0.5)
Eosinophils Relative: 0 %
HCT: 25.8 % — ABNORMAL LOW (ref 39.0–52.0)
Hemoglobin: 7.9 g/dL — ABNORMAL LOW (ref 13.0–17.0)
Immature Granulocytes: 1 %
Lymphocytes Relative: 4 %
Lymphs Abs: 0.6 10*3/uL — ABNORMAL LOW (ref 0.7–4.0)
MCH: 29.9 pg (ref 26.0–34.0)
MCHC: 30.6 g/dL (ref 30.0–36.0)
MCV: 97.7 fL (ref 80.0–100.0)
Monocytes Absolute: 0.5 10*3/uL (ref 0.1–1.0)
Monocytes Relative: 3 %
Neutro Abs: 13.2 10*3/uL — ABNORMAL HIGH (ref 1.7–7.7)
Neutrophils Relative %: 92 %
Platelets: 177 10*3/uL (ref 150–400)
RBC: 2.64 MIL/uL — ABNORMAL LOW (ref 4.22–5.81)
RDW: 16.8 % — ABNORMAL HIGH (ref 11.5–15.5)
WBC: 14.4 10*3/uL — ABNORMAL HIGH (ref 4.0–10.5)
nRBC: 0 % (ref 0.0–0.2)

## 2023-12-23 LAB — PHOSPHORUS: Phosphorus: 2.9 mg/dL (ref 2.5–4.6)

## 2023-12-23 LAB — GLUCOSE, CAPILLARY
Glucose-Capillary: 103 mg/dL — ABNORMAL HIGH (ref 70–99)
Glucose-Capillary: 110 mg/dL — ABNORMAL HIGH (ref 70–99)
Glucose-Capillary: 123 mg/dL — ABNORMAL HIGH (ref 70–99)
Glucose-Capillary: 154 mg/dL — ABNORMAL HIGH (ref 70–99)
Glucose-Capillary: 155 mg/dL — ABNORMAL HIGH (ref 70–99)
Glucose-Capillary: 193 mg/dL — ABNORMAL HIGH (ref 70–99)
Glucose-Capillary: 44 mg/dL — CL (ref 70–99)
Glucose-Capillary: 72 mg/dL (ref 70–99)

## 2023-12-23 LAB — MAGNESIUM: Magnesium: 2 mg/dL (ref 1.7–2.4)

## 2023-12-23 MED ORDER — FENTANYL CITRATE (PF) 100 MCG/2ML IJ SOLN
INTRAMUSCULAR | Status: AC | PRN
  Administered 2023-12-23: 50 ug via INTRAVENOUS

## 2023-12-23 MED ORDER — IOHEXOL 300 MG/ML  SOLN
50.0000 mL | Freq: Once | INTRAMUSCULAR | Status: AC | PRN
  Administered 2023-12-23: 20 mL

## 2023-12-23 MED ORDER — GLUCAGON HCL RDNA (DIAGNOSTIC) 1 MG IJ SOLR
INTRAMUSCULAR | Status: AC | PRN
  Administered 2023-12-23: .5 mg via INTRAVENOUS

## 2023-12-23 MED ORDER — CEFAZOLIN SODIUM-DEXTROSE 2-4 GM/100ML-% IV SOLN
INTRAVENOUS | Status: AC | PRN
  Administered 2023-12-23: 2 g via INTRAVENOUS

## 2023-12-23 MED ORDER — GLUCAGON HCL RDNA (DIAGNOSTIC) 1 MG IJ SOLR
INTRAMUSCULAR | Status: AC
  Filled 2023-12-23: qty 1

## 2023-12-23 MED ORDER — LIDOCAINE HCL 1 % IJ SOLN
INTRAMUSCULAR | Status: AC
  Filled 2023-12-23: qty 20

## 2023-12-23 MED ORDER — CEFAZOLIN SODIUM-DEXTROSE 2-4 GM/100ML-% IV SOLN
INTRAVENOUS | Status: AC
  Filled 2023-12-23: qty 100

## 2023-12-23 MED ORDER — MIDAZOLAM HCL 2 MG/2ML IJ SOLN
INTRAMUSCULAR | Status: AC | PRN
  Administered 2023-12-23 (×2): 1 mg via INTRAVENOUS

## 2023-12-23 MED ORDER — FENTANYL CITRATE (PF) 100 MCG/2ML IJ SOLN
INTRAMUSCULAR | Status: AC
  Filled 2023-12-23: qty 2

## 2023-12-23 MED ORDER — DEXTROSE IN LACTATED RINGERS 5 % IV SOLN: INTRAVENOUS | Status: AC

## 2023-12-23 MED ORDER — DEXTROSE 50 % IV SOLN
25.0000 g | INTRAVENOUS | Status: AC
  Administered 2023-12-23: 25 g via INTRAVENOUS
  Filled 2023-12-23: qty 50

## 2023-12-23 MED ORDER — MIDAZOLAM HCL 2 MG/2ML IJ SOLN
INTRAMUSCULAR | Status: AC
  Filled 2023-12-23: qty 2

## 2023-12-23 MED ORDER — LIDOCAINE HCL 1 % IJ SOLN
20.0000 mL | Freq: Once | INTRAMUSCULAR | Status: AC
  Administered 2023-12-23: 10 mL
  Filled 2023-12-23: qty 20

## 2023-12-23 NOTE — Progress Notes (Signed)
 Patient had hypoglycemic episode this morning with CBG 44.  Tube feed off pending PEG placement later today, however, D5LR running at 50.  1amp D50 given and CBG up to 154 on recheck.  Dr. Margo Aye notified.  See new orders.

## 2023-12-23 NOTE — Progress Notes (Signed)
 SLP Cancellation Note  Patient Details Name: William Todd MRN: [id number] DOB: 1951-03-06   Cancelled treatment:        SLP had been following pt and he had been to lethargic to participate and ST signed off. Wife wanted PEG and procedure had been planned. Re-evaluated for swallow 3/7 and recommend continue NPO. Unable to see pt today for po trials as PEG planned for later today- feel that this is warranted prior to SLP seeing per history since admission related to po's. ST will continue efforts.    Royce Macadamia 12/23/2023, 10:39 AM

## 2023-12-23 NOTE — Progress Notes (Signed)
 OT Cancellation Note  Patient Details Name: William Todd MRN: [id number] DOB: 04/25/1951   Cancelled Treatment:    Reason Eval/Treat Not Completed: Other (comment) (OT imminent order acknowledged. Discussed case with MD, OT/PT to co-evaluate pt tomorrow 3/12.)  [name] 12/23/2023, 2:01 PM

## 2023-12-23 NOTE — Procedures (Signed)
 Interventional Radiology Procedure Note  Procedure: fluoro 88fr pull thru GTUBE    Complications: None  Estimated Blood Loss:  MIN  Findings: CONFIRMED IN THE STOMACH FULL USE TOMORROW    M. Ruel Favors, MD

## 2023-12-23 NOTE — TOC Progression Note (Signed)
 Transition of Care [facility]) - Progression Note    Patient Details  Name: William Todd MRN: [id number] Date of Birth: 1951-07-05  [facility] CM/SW Contact  Baldemar Lenis, [place] Phone Number: 12/23/2023, 1:30 PM  Clinical Narrative:   CSW met with patient's spouse, Chyrl Civatte, to discuss DME for home as well as home health. Spouse indicated patient would need hospital bed, hoyer lift, wheelchair, and 3N1. Patient alert and conversing with the spouse during discussion, spouse having second thoughts about not pursuing rehab, wondering if the patient might do better now. CSW discussed with spouse reordering therapy to evaluate for rehab potential and seeing possible bed offers, then deciding on SNF vs HH based on options available. Spouse in agreement. CSW asked MD to order therapy evaluations now that patient is more alert, awaiting updated recommendations. CSW to follow.    Expected Discharge Plan: Skilled Nursing Facility Barriers to Discharge: Continued Medical Work up, English as a second language teacher  Expected Discharge Plan and Services In-house Referral: Clinical Social Work, Hospice / Palliative Care     Living arrangements for the past 2 months: Single Family Home, Skilled Nursing Facility                                       Social Determinants of Health (SDOH) Interventions SDOH Screenings   Food Insecurity: No Food Insecurity (11/30/2023)  Housing: Low Risk  (11/30/2023)  Transportation Needs: No Transportation Needs (11/30/2023)  Utilities: Not At Risk (11/30/2023)  Social Connections: Moderately Integrated (11/30/2023)  Tobacco Use: Low Risk  (11/30/2023)    Readmission Risk Interventions    12/08/2023   12:40 PM  Readmission Risk Prevention Plan  Transportation Screening Complete  Medication Review (RN Care Manager) Complete  PCP or Specialist appointment within 3-5 days of discharge Complete  HRI or Home Care Consult Complete  SW Recovery  Care/Counseling Consult Complete  Palliative Care Screening Complete  Skilled Nursing Facility Complete

## 2023-12-23 NOTE — Progress Notes (Signed)
 Recurrent hypoglycemia overnight with CBG 44.  D5 LR at 40 cc/h x 1 day restarted.  Basal insulin and short acting insulin held.  Last hemoglobin A1c 6.1 on 12/02/2023.  Defer to day team to reassess insulin requirement.   No charge note.

## 2023-12-23 NOTE — Plan of Care (Signed)
  Problem: Fluid Volume: Goal: Ability to maintain a balanced intake and output will improve Outcome: Progressing   Problem: Metabolic: Goal: Ability to maintain appropriate glucose levels will improve Outcome: Progressing   Problem: Nutritional: Goal: Maintenance of adequate nutrition will improve Outcome: Progressing Goal: Progress toward achieving an optimal weight will improve Outcome: Progressing   Problem: Skin Integrity: Goal: Risk for impaired skin integrity will decrease Outcome: Progressing   Problem: Tissue Perfusion: Goal: Adequacy of tissue perfusion will improve Outcome: Progressing   Problem: Clinical Measurements: Goal: Ability to maintain clinical measurements within normal limits will improve Outcome: Progressing Goal: Will remain free from infection Outcome: Progressing Goal: Diagnostic test results will improve Outcome: Progressing Goal: Respiratory complications will improve Outcome: Progressing Goal: Cardiovascular complication will be avoided Outcome: Progressing   Problem: Nutrition: Goal: Adequate nutrition will be maintained Outcome: Progressing   Problem: Elimination: Goal: Will not experience complications related to bowel motility Outcome: Progressing Goal: Will not experience complications related to urinary retention Outcome: Progressing   Problem: Education: Goal: Ability to describe self-care measures that may prevent or decrease complications (Diabetes Survival Skills Education) will improve Outcome: Not Progressing   Problem: Coping: Goal: Ability to adjust to condition or change in health will improve Outcome: Not Progressing   Problem: Health Behavior/Discharge Planning: Goal: Ability to identify and utilize available resources and services will improve Outcome: Not Progressing Goal: Ability to manage health-related needs will improve Outcome: Not Progressing   Problem: Education: Goal: Knowledge of General Education  information will improve Description: Including pain rating scale, medication(s)/side effects and non-pharmacologic comfort measures Outcome: Not Progressing   Problem: Health Behavior/Discharge Planning: Goal: Ability to manage health-related needs will improve Outcome: Not Progressing   Problem: Activity: Goal: Risk for activity intolerance will decrease Outcome: Not Progressing

## 2023-12-23 NOTE — TOC Progression Note (Signed)
 Transition of Care [facility]) - Progression Note    Patient Details  Name: ORLEY LAWRY MRN: [id number] Date of Birth: Oct 16, 1950  [facility] CM/SW Contact  Baldemar Lenis, [place] Phone Number: 12/23/2023, 10:40 AM  Clinical Narrative:   CSW met with patient's spouse at bedside to discuss disposition. Spouse still interested in patient going home, would like home health through [facility]. Patient has no equipment at home, would need everything recommended to provide his care. Patient awaiting peg placement. CSW to continue to follow.    Expected Discharge Plan: Home w Home Health Services Barriers to Discharge: Continued Medical Work up  Expected Discharge Plan and Services In-house Referral: Clinical Social Work, Hospice / Palliative Care     Living arrangements for the past 2 months: Single Family Home, Skilled Nursing Facility                                       Social Determinants of Health (SDOH) Interventions SDOH Screenings   Food Insecurity: No Food Insecurity (11/30/2023)  Housing: Low Risk  (11/30/2023)  Transportation Needs: No Transportation Needs (11/30/2023)  Utilities: Not At Risk (11/30/2023)  Social Connections: Moderately Integrated (11/30/2023)  Tobacco Use: Low Risk  (11/30/2023)    Readmission Risk Interventions    12/08/2023   12:40 PM  Readmission Risk Prevention Plan  Transportation Screening Complete  Medication Review (RN Care Manager) Complete  PCP or Specialist appointment within 3-5 days of discharge Complete  HRI or Home Care Consult Complete  SW Recovery Care/Counseling Consult Complete  Palliative Care Screening Complete  Skilled Nursing Facility Complete

## 2023-12-23 NOTE — Progress Notes (Addendum)
 PROGRESS NOTE    William Todd  NGE:[id number] DOB: 09-14-1951 DOA: 11/29/2023 PCP: Knox Royalty, MD   Brief Narrative:  Pt is 73 year old Male with PMH of HTN, recurrents mechanical fall, protein caloric malnutrition, DMT2, CKD3a, prostate cancer 2014 who presents via EMS from [facility] while he is rehabbing his recent leg surgery and subsequently had a ground-level fall at [facility].  Workup on admission revealed acute intraparenchymal hemorrhage of the left frontal lobe with multiple bilateral small volume SAH.  Neurosurgery and Neurology consulted and because upon repeat imaging his ICH was worsening he was admitted to CCM for close monitoring.  Underwent multiple repeat head imaging and was then stabilized and Neurosurgery signed off.  Transferred to [facility] service on 12/09/2023 and his CODE STATUS was changed to DNR/DNI on 3/1.  Hospitalization has been complicated by intermittently Fevers likely from aspiration so was initiated on IV Unasyn ID was consulted.  Had a small bore Cortrak placed his inability swallow.  Subsequently the small bore Cortrak tube was found to be in the left mainstem bronchus and malpositioned on 12/20/2023 so tube feedings were held and discontinued and tube was removed  He was restarted on antibiotics for another 5 days due to concern for recurrent Aspiration.  Cortrak replaced 12/22/23 and underwent PEG tube placement 12/23/2023.  Palliative following.    Current plan is for repeat swallow evaluation and initiation of feedings through his PEG tube on 12/24/2023.  Given his improved mentation we will have PT OT reevaluate given his wife may now consider going back to SNF vs taking the patient home.  Assessment and Plan:  Left Frontal Intracerebral Traumatic Hematoma: Patient presented with B/L SAH sec.to trauma. Status post 7 days of prophylactic Keppra. Neurosurgery signed off. Still not been able to swallow at this time but SLP is going to reevaluate..  PEG tube was placed  today and continues to undergo palliative care discussions.  Plan is for PEG use 12/24/2023 with removal of core track and reevaluation with SLP.  PT OT to reevaluate given now that the patient's mentation is doing little bit better.  She was also following but signed off the case and recommending no antithrombotics now due to ICH.  Cerebral Edema: Repeat CT 2/17, 5 mm rightward  midline shift. CT 2/18: MLS  7/40mm MRI: 2/22:MLS 4mm. Sodium improved.    Aspiration Pneumonia:  Continued to spike fevers but now improving. Initial Chest x-ray concerning for aspiration pneumonia, S/p IV Unasyn x 5 days and ID consultation but will resume IV Unasyn for another 5 Days given that Cortrak was noted in the Left Lung Bronchus a few days ago.Marland Kitchen COVID, RSV, influenza negative. C/w Pulmonary Toileting and add Xopenex/Atrovent. WBC had normalized at 9.4 but is now trended up to 14.4. Repeat CXR this AM showed no active disease but since WBC is slowly trending up now but will empirically treat again   Essential HTN: Goal is normotensive. C/w Amlodipine 5 mg po Daily and Metoprolol Tartrate 50 mg per Tube BID. CTM BP per protocol. Last BP reading was 125/69   Diabetes Mellitus Type 2:  Moderate Novolog SSI AC changed to Moderate q4h overnight by Covering Physician due to Hypoglycemia; Long Insulin Glargine 10 units sq Daily discontinued overnight given Hypoglycemia as well as the 8 units TIDwm. CBGs now ranging from 110-155 the last 5 checks; adjust insulin regimen as necessary and may need long-acting again and changing back to before meals and at bedtime now that he is initiated back  on tube feedings   History of frailty with recurrent mechanical falls: PT/OT had recommended SNF wife wanted to take the patient home but given that patient's mentation is improving, will reconsult PT OT for SNF evaluation  HyperNa+: Mild worsened in the setting of not having access and no intake. Na+ is 147. D5 in LR@40  mL/hr now resumed  x1 Day .C/w  FW Flushes of 200 mL q4h for now. CTM and Trend and repeat CMP in the AM  AKI on CKD Stage 3a: BUN/Cr had worsened and peaked at 62/1.92  BUN/Cr now 21/1.43. D5 inLR @ 40 mL/hr now resumed x1 day. Avoid Nephrotoxic Medications, Contrast Dyes (if possible), Hypotension and Dehydration to Ensure Adequate Renal Perfusion and will need to Renally Adjust Meds. CTM and Trend Renal Function carefully and repeat CMP in the AM   Abnormal LFTs: Remains persistently elevated but relatively stable.  AST is now 61 and ALT is now 95. CTM and trend and repeat CMP in a.m. and if necessary will obtain right upper quadrant ultrasound but LFTs fluctuating   Normocytic Anemia: H/H now slowly improving and is is now 7.9/25.8 and relatively stable.  Anemia panel was checked and showed an iron level of 32, UIBC 130, TIBC of 162, saturation ratios of 20%, ferritin 566, folate of 1.6 and a vitamin B12 874. CTM for S/Sx of Bleeding. Transfuse pRBCs for Hb <7. Repeat CBC in the AM  GERD/GI Prophylaxis: C/w IV PPI with Pantoprazole 40 mg q12h  Hypoalbuminemia: Albumin Level is again 1.9. CTM and Trend and repeat CMP in the AM  Pressure Injury, poA Pressure Injury 11/30/23 Hip Left Stage 2 -  Partial thickness loss of dermis presenting as a shallow open injury with a red, pink wound bed without slough. (Active)  11/30/23 2000  Location: Hip  Location Orientation: Left  Staging: Stage 2 -  Partial thickness loss of dermis presenting as a shallow open injury with a red, pink wound bed without slough.  Wound Description (Comments):   Present on Admission:      Pressure Injury 12/01/23 Knee Left;Posterior Stage 3 -  Full thickness tissue loss. Subcutaneous fat may be visible but bone, tendon or muscle are NOT exposed. (Active)  12/01/23 2000  Location: Knee  Location Orientation: Left;Posterior  Staging: Stage 3 -  Full thickness tissue loss. Subcutaneous fat may be visible but bone, tendon or muscle are NOT  exposed.  Wound Description (Comments):   Present on Admission:      Pressure Injury 12/14/23 Foot Left;Lateral Unstageable - Full thickness tissue loss in which the base of the injury is covered by slough (yellow, tan, gray, green or brown) and/or eschar (tan, brown or black) in the wound bed. left lateral foot (Active)  12/14/23 0730  Location: Foot  Location Orientation: Left;Lateral (Left Lateral Foot)  Staging: Unstageable - Full thickness tissue loss in which the base of the injury is covered by slough (yellow, tan, gray, green or brown) and/or eschar (tan, brown or black) in the wound bed.  Wound Description (Comments): left lateral foot  Present on Admission:      Pressure Injury 12/19/23 Sacrum Bilateral Stage 2 -  Partial thickness loss of dermis presenting as a shallow open injury with a red, pink wound bed without slough. (Active)  12/19/23 0800  Location: Sacrum  Location Orientation: Bilateral  Staging: Stage 2 -  Partial thickness loss of dermis presenting as a shallow open injury with a red, pink wound bed without slough.  Wound  Description (Comments):   Present on Admission: No   Severe Malnutrition in the context of Chronic illness: Nutrition Problem: Severe Malnutrition Etiology: chronic illness Signs/Symptoms: severe fat depletion, severe muscle depletion, percent weight loss Interventions: Tube feeding, MVI, Refer to RD note for recommendations; PEG now placed and will use tomorrow. Remove Cortrak in the AM and Anticipated long term need for Enteral feeding given unsafe swallowing at this time.    DVT prophylaxis: heparin injection 5,000 Units Start: 12/06/23 1000 SCDs Start: 11/30/23 0334 Place TED hose Start: 11/30/23 0334    Code Status: Limited: Do not attempt resuscitation (DNR) -DNR-LIMITED -Do Not Intubate/DNI  Family Communication: Discussed with Wife at Bedside  Disposition Plan:  Level of care: Progressive Status is: Inpatient Remains inpatient  appropriate because: Needs PT/OT Re-evaluation for SNF as wife is still deciding SNF vs HH options, Insurance Authorization, and SLP reevaluation for oral diet intake; PEG tube can be used 12/24/2023 and we will continue to treat him for his recurrent aspiration.   Consultants:  Neurosurgery Neurology ID CCM Transfer  Palliative  Procedures:  As delineated as above  Antimicrobials:  Anti-infectives (From admission, onward)    Start     Dose/Rate Route Frequency Ordered Stop   12/23/23 1419  ceFAZolin (ANCEF) IVPB 2g/100 mL premix        over 30 Minutes  Continuous PRN 12/23/23 1419 12/23/23 1419   12/22/23 1745  Ampicillin-Sulbactam (UNASYN) 3 g in sodium chloride 0.9 % 100 mL IVPB        3 g 200 mL/hr over 30 Minutes Intravenous Every 6 hours 12/22/23 1651     12/15/23 1200  Ampicillin-Sulbactam (UNASYN) 3 g in sodium chloride 0.9 % 100 mL IVPB        3 g 200 mL/hr over 30 Minutes Intravenous Every 6 hours 12/15/23 1018 12/20/23 0551   12/15/23 0845  ceFEPIme (MAXIPIME) 2 g in sodium chloride 0.9 % 100 mL IVPB  Status:  Discontinued        2 g 200 mL/hr over 30 Minutes Intravenous Every 12 hours 12/15/23 0745 12/15/23 1018   12/14/23 1515  Ampicillin-Sulbactam (UNASYN) 3 g in sodium chloride 0.9 % 100 mL IVPB  Status:  Discontinued        3 g 200 mL/hr over 30 Minutes Intravenous Every 6 hours 12/14/23 1422 12/15/23 0745       Subjective: Seen and examined at bedside and nursing states that he is actually interacting little bit more and actually verbalized "good morning to the nurse".  Wife thinks that this is the most interactive he has been since he been here.  Plan is for PEG tube this afternoon.  No other concerns or points this time.  Objective: Vitals:   12/23/23 1425 12/23/23 1427 12/23/23 1428 12/23/23 1530  BP: 133/76 133/76  125/69  Pulse: 81 82 82 82  Resp: 18 19 17    Temp:    97.6 F (36.4 C)  TempSrc:    Oral  SpO2: 100% 100% 100% 100%  Weight:      Height:         Intake/Output Summary (Last 24 hours) at 12/23/2023 1907 Last data filed at 12/23/2023 1800 Gross per 24 hour  Intake 509.91 ml  Output 1700 ml  Net -1190.09 ml   Filed Weights   12/21/23 0458 12/22/23 0409 12/23/23 0625  Weight: 63.3 kg 62.9 kg 64.2 kg   Examination: Physical Exam:  Constitutional: Chronically ill-appearing African-American male who is alert more interactive today  and was actually able to verbalize some words today. Respiratory: Diminished to auscultation bilaterally with some coarse breath sounds and some slight rhonchi but no appreciable wheezing or rales. Normal respiratory effort and patient is not tachypenic. No accessory muscle use.  Unlabored breathing  Cardiovascular: RRR, no murmurs / rubs / gallops. S1 and S2 auscultated. No extremity edema Abdomen: Soft, non-tender, non-distended. Bowel sounds positive.  GU: Deferred. Musculoskeletal: No clubbing / cyanosis of digits/nails. No joint deformity upper and lower extremities. Neurologic: Opens eyes to voice and can track actually able to verbalize today to the nurse.  Has some Hemiparesis on the left Psychiatric: Appears calm and resting  Data Reviewed: I have personally reviewed following labs and imaging studies  CBC: Recent Labs  Lab 12/19/23 0711 12/20/23 0922 12/21/23 0729 12/22/23 0714 12/23/23 1018  WBC 9.1 8.7 9.4 10.7* 14.4*  NEUTROABS 7.8* 7.7 8.1* 9.5* 13.2*  HGB 7.3* 7.6* 7.3* 7.3* 7.9*  HCT 24.4* 24.7* 23.8* 23.9* 25.8*  MCV 98.0 97.6 97.9 98.0 97.7  PLT 199 197 208 201 177   Basic Metabolic Panel: Recent Labs  Lab 12/19/23 0711 12/20/23 0922 12/21/23 0729 12/22/23 0714 12/23/23 1018  NA 143 144 143 148* 147*  K 3.9 4.4 4.3 4.3 3.9  CL 113* 109 112* 113* 115*  CO2 26 24 25 26 25   GLUCOSE 189* 240* 139* 133* 131*  BUN 34* 25* 26* 29* 21  CREATININE 1.48* 1.27* 1.32* 1.47* 1.43*  CALCIUM 7.7* 8.3* 8.3* 8.9 8.7*  MG 2.4 2.1 2.2 2.2 2.0  PHOS 3.0 3.3 3.0 3.6 2.9    GFR: Estimated Creatinine Clearance: 41.8 mL/min (A) (by C-G formula based on SCr of 1.43 mg/dL (H)). Liver Function Tests: Recent Labs  Lab 12/19/23 0711 12/20/23 0922 12/21/23 0729 12/22/23 0714 12/23/23 1018  AST 75* 73* 75* 57* 61*  ALT 121* 114* 110* 96* 95*  ALKPHOS 112 117 112 115 114  BILITOT 0.4 0.6 0.6 0.8 0.6  PROT 5.1* 5.4* 5.6* 5.8* 5.8*  ALBUMIN 1.8* 1.9* 1.9* 1.9* 2.0*   No results for input(s): "LIPASE", "AMYLASE" in the last 168 hours. No results for input(s): "AMMONIA" in the last 168 hours. Coagulation Profile: Recent Labs  Lab 12/21/23 1509  INR 1.3*   Cardiac Enzymes: No results for input(s): "CKTOTAL", "CKMB", "CKMBINDEX", "TROPONINI" in the last 168 hours. BNP (last 3 results) No results for input(s): "PROBNP" in the last 8760 hours. HbA1C: No results for input(s): "HGBA1C" in the last 72 hours. CBG: Recent Labs  Lab 12/23/23 0403 12/23/23 0410 12/23/23 0750 12/23/23 1152 12/23/23 1647  GLUCAP 193* 154* 123* 110* 155*   Lipid Profile: No results for input(s): "CHOL", "HDL", "LDLCALC", "TRIG", "CHOLHDL", "LDLDIRECT" in the last 72 hours. Thyroid Function Tests: No results for input(s): "TSH", "T4TOTAL", "FREET4", "T3FREE", "THYROIDAB" in the last 72 hours. Anemia Panel: Recent Labs    12/21/23 0729  VITAMINB12 874  FOLATE 11.6  FERRITIN 566*  TIBC 162*  IRON 32*  RETICCTPCT 4.2*   Sepsis Labs: No results for input(s): "PROCALCITON", "LATICACIDVEN" in the last 168 hours.  Recent Results (from the past 240 hours)  Culture, blood (single) w Reflex to ID Panel     Status: None   Collection Time: 12/14/23  9:48 PM   Specimen: BLOOD LEFT ARM  Result Value Ref Range Status   Specimen Description BLOOD LEFT ARM  Final   Special Requests   Final    BOTTLES DRAWN AEROBIC AND ANAEROBIC Blood Culture results may not be optimal  due to an inadequate volume of blood received in culture bottles   Culture   Final    NO GROWTH 5  DAYS Performed at [facility], [address] [place], [place] [address]    Report Status 12/19/2023 FINAL  Final  Resp panel by RT-PCR (RSV, Flu A&B, Covid) Anterior Nasal Swab     Status: None   Collection Time: 12/15/23  8:07 AM   Specimen: Anterior Nasal Swab  Result Value Ref Range Status   SARS Coronavirus 2 by RT PCR NEGATIVE NEGATIVE Final   Influenza A by PCR NEGATIVE NEGATIVE Final   Influenza B by PCR NEGATIVE NEGATIVE Final    Comment: (NOTE) The Xpert Xpress SARS-CoV-2/FLU/RSV plus assay is intended as an aid in the diagnosis of influenza from Nasopharyngeal swab specimens and should not be used as a sole basis for treatment. Nasal washings and aspirates are unacceptable for Xpert Xpress SARS-CoV-2/FLU/RSV testing.  Fact Sheet for Patients: BloggerCourse.com  Fact Sheet for Healthcare Providers: SeriousBroker.it  This test is not yet approved or cleared by the [place] FDA and has been authorized for detection and/or diagnosis of SARS-CoV-2 by FDA under an Emergency Use Authorization (EUA). This EUA will remain in effect (meaning this test can be used) for the duration of the COVID-19 declaration under Section 564(b)(1) of the Act, 21 U.S.C. section 360bbb-3(b)(1), unless the authorization is terminated or revoked.     Resp Syncytial Virus by PCR NEGATIVE NEGATIVE Final    Comment: (NOTE) Fact Sheet for Patients: BloggerCourse.com  Fact Sheet for Healthcare Providers: SeriousBroker.it  This test is not yet approved or cleared by the [place] FDA and has been authorized for detection and/or diagnosis of SARS-CoV-2 by FDA under an Emergency Use Authorization (EUA). This EUA will remain in effect (meaning this test can be used) for the duration of the COVID-19 declaration under Section 564(b)(1) of the Act, 21 U.S.C. section 360bbb-3(b)(1), unless  the authorization is terminated or revoked.  Performed at [facility], [address], [place], [place] [address]     Radiology Studies: IR GASTROSTOMY TUBE MOD SED Result Date: 12/23/2023 INDICATION: Subarachnoid hemorrhage, dysphagia EXAM: FLUOROSCOPIC 20 FRENCH PULL-THROUGH GASTROSTOMY Date:  12/23/2023 12/23/2023 2:48 pm Radiologist:  Judie Petit. Ruel Favors, MD Guidance:  Fluoroscopic MEDICATIONS: Ancef 2 g; Antibiotics were administered within 1 hour of the procedure. Glucagon 0.5 mg IV ANESTHESIA/SEDATION: Versed 2.0 mg IV; Fentanyl 100 mcg IV Moderate Sedation Time:  11 minutes The patient was continuously monitored during the procedure by the interventional radiology nurse under my direct supervision. CONTRAST:  20mL OMNIPAQUE IOHEXOL 300 MG/ML SOLN - administered into the gastric lumen. FLUOROSCOPY: Fluoroscopy Time: 6 minutes 0 seconds (100 mGy). COMPLICATIONS: None immediate. PROCEDURE: Informed consent was obtained from the patient following explanation of the procedure, risks, benefits and alternatives. The patient understands, agrees and consents for the procedure. All questions were addressed. A time out was performed. Maximal barrier sterile technique utilized including caps, mask, sterile gowns, sterile gloves, large sterile drape, hand hygiene, and betadine prep. The left upper quadrant was sterilely prepped and draped. An oral gastric catheter was inserted into the stomach under fluoroscopy. The existing nasogastric feeding tube was removed. Air was injected into the stomach for insufflation and visualization under fluoroscopy. The air distended stomach was confirmed beneath the anterior abdominal wall in the frontal and lateral projections. Under sterile conditions and local anesthesia, a 17 gauge trocar needle was utilized to access the stomach percutaneously beneath the left  subcostal margin. Needle position was confirmed within the stomach under biplane fluoroscopy. Contrast injection  confirmed position also. A single T tack was deployed for gastropexy. Over an Amplatz guide wire, a 9-French sheath was inserted into the stomach. A snare device was utilized to capture the oral gastric catheter. The snare device was pulled retrograde from the stomach up the esophagus and out the oropharynx. The 20-French pull-through gastrostomy was connected to the snare device and pulled antegrade through the oropharynx down the esophagus into the stomach and then through the percutaneous tract external to the patient. The gastrostomy was assembled externally. Contrast injection confirms position in the stomach. Images were obtained for documentation. The patient tolerated procedure well. No immediate complication. IMPRESSION: Fluoroscopic insertion of a 20-French "pull-through" gastrostomy. Electronically Signed   By: Judie Petit.  Shick M.D.   On: 12/23/2023 14:58   DG CHEST PORT 1 VIEW Result Date: 12/23/2023 CLINICAL DATA:  Shortness of breath EXAM: PORTABLE CHEST 1 VIEW COMPARISON:  Film from the previous day. FINDINGS: Cardiac shadow is within normal limits. The lungs are clear bilaterally. Feeding catheter extends into the stomach. IMPRESSION: No active disease. Electronically Signed   By: Alcide Clever M.D.   On: 12/23/2023 11:01   DG Abd Portable 1V Result Date: 12/23/2023 CLINICAL DATA:  Check colonic contrast EXAM: PORTABLE ABDOMEN - 1 VIEW COMPARISON:  12/18/2023 FINDINGS: Contrast is noted throughout the colon. Feeding catheter is noted within the stomach. Some partial overlap of the stomach with the transverse colon is noted similar to that seen on prior CT. IMPRESSION: Transverse colon and gastric lumen in close proximity. Electronically Signed   By: Alcide Clever M.D.   On: 12/23/2023 11:00   DG CHEST PORT 1 VIEW Addendum Date: 12/22/2023 ADDENDUM REPORT: 12/22/2023 08:57 ADDENDUM: These results were called by telephone at the time of interpretation on 12/22/2023 at 8:56 am to provider [facility] ,  who verbally acknowledged these results. Electronically Signed   By: Signa Kell M.D.   On: 12/22/2023 08:57   Result Date: 12/22/2023 CLINICAL DATA:  Shortness of breath EXAM: PORTABLE CHEST 1 VIEW COMPARISON:  12/21/2023 FINDINGS: The enteric tube tip is now within the mid mediastinum in the projection of the distal left mainstem bronchus. This is similar in position to the previous exam. Heart size is normal. Aortic atherosclerotic calcifications. No pleural fluid, interstitial edema or airspace disease. Scar versus atelectasis within the periphery of the left lower lobe. IMPRESSION: 1. Enteric tube tip is now within the mid mediastinum in the projection of the distal left mainstem bronchus. This is similar in position to the previous exam. Recommend repositioning. 2. No acute cardiopulmonary abnormalities. Electronically Signed: By: Signa Kell M.D. On: 12/22/2023 08:49   Scheduled Meds:  amantadine  100 mg Per Tube BID   amLODipine  5 mg Per Tube Daily   atorvastatin  40 mg Per Tube Daily   feeding supplement (PROSource TF20)  60 mL Per Tube Daily   free water  200 mL Per Tube Q4H   heparin injection (subcutaneous)  5,000 Units Subcutaneous Q12H   insulin aspart  0-15 Units Subcutaneous Q4H   metoCLOPramide  10 mg Per Tube TID AC & HS   metoprolol tartrate  50 mg Per Tube BID   multivitamin with minerals  1 tablet Per Tube Daily   mouth rinse  15 mL Mouth Rinse 4 times per day   pantoprazole (PROTONIX) IV  40 mg Intravenous Q12H   sodium chloride flush  3 mL  Intravenous Q12H   Continuous Infusions:  ampicillin-sulbactam (UNASYN) IV 3 g (12/23/23 1656)   chlorproMAZINE (THORAZINE) 12.5 mg in sodium chloride 0.9 % 25 mL IVPB 50 mL/hr at 12/22/23 1606   dextrose 5% lactated ringers 40 mL/hr at 12/23/23 0428   feeding supplement (OSMOLITE 1.5 CAL) 1,000 mL (12/22/23 1606)    LOS: 23 days   Marguerita Merles, DO [facility] Available via Epic secure chat 7am-7pm After these  hours, please refer to coverage provider listed on [email or web address] 12/23/2023, 7:07 PM

## 2023-12-24 ENCOUNTER — Inpatient Hospital Stay (HOSPITAL_COMMUNITY)

## 2023-12-24 DIAGNOSIS — I611 Nontraumatic intracerebral hemorrhage in hemisphere, cortical: Secondary | ICD-10-CM | POA: Diagnosis not present

## 2023-12-24 DIAGNOSIS — I609 Nontraumatic subarachnoid hemorrhage, unspecified: Secondary | ICD-10-CM | POA: Diagnosis not present

## 2023-12-24 DIAGNOSIS — W19XXXA Unspecified fall, initial encounter: Secondary | ICD-10-CM | POA: Diagnosis not present

## 2023-12-24 DIAGNOSIS — I619 Nontraumatic intracerebral hemorrhage, unspecified: Secondary | ICD-10-CM | POA: Diagnosis not present

## 2023-12-24 LAB — GLUCOSE, CAPILLARY
Glucose-Capillary: 124 mg/dL — ABNORMAL HIGH (ref 70–99)
Glucose-Capillary: 93 mg/dL (ref 70–99)
Glucose-Capillary: 186 mg/dL — ABNORMAL HIGH (ref 70–99)
Glucose-Capillary: 190 mg/dL — ABNORMAL HIGH (ref 70–99)
Glucose-Capillary: 268 mg/dL — ABNORMAL HIGH (ref 70–99)
Glucose-Capillary: 262 mg/dL — ABNORMAL HIGH (ref 70–99)
Glucose-Capillary: 203 mg/dL — ABNORMAL HIGH (ref 70–99)

## 2023-12-24 LAB — CBC WITH DIFFERENTIAL/PLATELET
Abs Immature Granulocytes: 0.13 10*3/uL — ABNORMAL HIGH (ref 0.00–0.07)
Basophils Absolute: 0 10*3/uL (ref 0.0–0.1)
Basophils Relative: 0 %
Eosinophils Absolute: 0 10*3/uL (ref 0.0–0.5)
Eosinophils Relative: 0 %
HCT: 26.2 % — ABNORMAL LOW (ref 39.0–52.0)
Hemoglobin: 7.9 g/dL — ABNORMAL LOW (ref 13.0–17.0)
Immature Granulocytes: 1 %
Lymphocytes Relative: 3 %
Lymphs Abs: 0.5 10*3/uL — ABNORMAL LOW (ref 0.7–4.0)
MCH: 29.7 pg (ref 26.0–34.0)
MCHC: 30.2 g/dL (ref 30.0–36.0)
MCV: 98.5 fL (ref 80.0–100.0)
Monocytes Absolute: 0.3 10*3/uL (ref 0.1–1.0)
Monocytes Relative: 2 %
Neutro Abs: 13.6 10*3/uL — ABNORMAL HIGH (ref 1.7–7.7)
Neutrophils Relative %: 94 %
Platelets: 186 10*3/uL (ref 150–400)
RBC: 2.66 MIL/uL — ABNORMAL LOW (ref 4.22–5.81)
RDW: 17 % — ABNORMAL HIGH (ref 11.5–15.5)
WBC: 14.5 10*3/uL — ABNORMAL HIGH (ref 4.0–10.5)
nRBC: 0 % (ref 0.0–0.2)

## 2023-12-24 LAB — MAGNESIUM: Magnesium: 1.9 mg/dL (ref 1.7–2.4)

## 2023-12-24 LAB — COMPREHENSIVE METABOLIC PANEL
ALT: 66 U/L — ABNORMAL HIGH (ref 0–44)
AST: 40 U/L (ref 15–41)
Albumin: 2 g/dL — ABNORMAL LOW (ref 3.5–5.0)
Alkaline Phosphatase: 117 U/L (ref 38–126)
Anion gap: 8 (ref 5–15)
BUN: 19 mg/dL (ref 8–23)
CO2: 25 mmol/L (ref 22–32)
Calcium: 8.9 mg/dL (ref 8.9–10.3)
Chloride: 115 mmol/L — ABNORMAL HIGH (ref 98–111)
Creatinine, Ser: 1.43 mg/dL — ABNORMAL HIGH (ref 0.61–1.24)
GFR, Estimated: 52 mL/min — ABNORMAL LOW (ref >60)
Glucose, Bld: 130 mg/dL — ABNORMAL HIGH (ref 70–99)
Potassium: 4 mmol/L (ref 3.5–5.1)
Sodium: 148 mmol/L — ABNORMAL HIGH (ref 135–145)
Total Bilirubin: 0.7 mg/dL (ref 0.0–1.2)
Total Protein: 6.1 g/dL — ABNORMAL LOW (ref 6.5–8.1)

## 2023-12-24 LAB — PHOSPHORUS: Phosphorus: 3.3 mg/dL (ref 2.5–4.6)

## 2023-12-24 NOTE — Evaluation (Signed)
 Physical Therapy Evaluation Patient Details Name: William Todd MRN: [id number] DOB: Aug 09, 1951 Today's Date: 12/24/2023  History of Present Illness  William Todd is a 73 y.o. male who presented on 2/15 after ground-level fall at SNF. 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 Cortrak replaced on 12/22/2023 and underwent PEG tube placement on 12/23/2023. PMHx includes HTN, recurrent mechanical falls, protein calorie malnutrition, DM2, CKD, and prostate cancer 2014, L foot fx with AFO,  L THA 11/16/23, anxiety, dementia  Clinical Impression  Pt is presenting below baseline level of functioning. Unclear at what level pt was at prior to hospitalization due to pt was getting assistance at rehab. Pt currently is a total Assist for bed mobility and sit to stands. Pt was able to participate and initiate some movement. Min-Max A for sitting EOB. Due to pt current functional status, home set up and available assistance at home recommending skilled physical therapy services < 3 hours/day in order to address strength, balance and functional mobility to decrease risk for falls, injury, immobility, skin break down and re-hospitalization.          If plan is discharge home, recommend the following: Assistance with cooking/housework;Assist for transportation;Help with stairs or ramp for entrance;Supervision due to cognitive status;A lot of help with walking and/or transfers     Equipment Recommendations None recommended by PT     Functional Status Assessment Patient has had a recent decline in their functional status and demonstrates the ability to make significant improvements in function in a reasonable and predictable amount of time.     Precautions / Restrictions Precautions Precautions: Fall;Other (comment) (seizure) Precaution Booklet Issued: No Required Braces or Orthoses: Other Brace Other Brace: L AFO Restrictions Weight Bearing Restrictions Per Provider  Order: No      Mobility  Bed Mobility Overal bed mobility: Needs Assistance Bed Mobility: Supine to Sit, Sit to Supine     Supine to sit: Total assist, +2 for physical assistance Sit to supine: +2 for physical assistance, Total assist   General bed mobility comments: Pt with good initiation moving LLE OOB but inconsistently followed 1-step verbal commands and overall required up to TOTAL A +2 to complete bed mobility tasks    Transfers Overall transfer level: Needs assistance Equipment used: 2 person hand held assist Transfers: Sit to/from Stand Sit to Stand: Total assist, +2 physical assistance           General transfer comment: +2 for safety. Pt unable to fully stand upright. Pt with increased knee and trunk flexion. Attempted 2x.    Ambulation/Gait     General Gait Details: Unable at this time.     Balance Overall balance assessment: Needs assistance Sitting-balance support: Bilateral upper extremity supported, Feet supported Sitting balance-Leahy Scale: Poor Sitting balance - Comments: static sitting EOB. Requires Min to Max A intermittently for sitting EOB due to heavy posterior lean. Postural control: Posterior lean Standing balance support: Bilateral upper extremity supported Standing balance-Leahy Scale: Zero Standing balance comment: +2 support         Pertinent Vitals/Pain Pain Assessment Pain Assessment: No/denies pain    Home Living Family/patient expects to be discharged to:: Skilled nursing facility Living Arrangements: Spouse/significant other Available Help at Discharge: Family Type of Home: House Home Access: Stairs to enter Entrance Stairs-Rails: Doctor, general practice of Steps: 1 from the garage into the house Alternate Level Stairs-Number of Steps: 13 upstiars to bed/bath. Does have a murphy bed downstairs in  a sunken room - 1 step down. Home Layout: Two level;1/2 bath on main level;Able to live on main level with  bedroom/bathroom Home Equipment: Cane - single point;Rolling Walker (2 wheels)      Prior Function Prior Level of Function : Needs assist       Physical Assist : ADLs (physical);Mobility (physical) Mobility (physical): Transfers ADLs (physical): Bathing;Dressing;Toileting;IADLs Mobility Comments: has AFO for L foot, used cane before L hip fx ADLs Comments: requires (A) since 2/4 admission due to hip surgery. uncertain how indep pt demonstrates at SNF due to staff always helping     Extremity/Trunk Assessment   Upper Extremity Assessment Upper Extremity Assessment: Defer to OT evaluation RUE Deficits / Details: less than 50% shoulder flexion AROM, decreased grip strength RUE Coordination: decreased gross motor    Lower Extremity Assessment Lower Extremity Assessment: Generalized weakness;LLE deficits/detail LLE Deficits / Details: Recent hip surgery    Cervical / Trunk Assessment Cervical / Trunk Exceptions: Forward head posture with rounded shoulders  Communication   Communication Communication: Impaired Factors Affecting Communication: Difficulty expressing self    Cognition Arousal: Alert Behavior During Therapy: Flat affect   PT - Cognitive impairments: Problem solving, Safety/Judgement, Attention Difficult to assess due to: Impaired communication     PT - Cognition Comments: Pt answers simple questions intermittently Following commands: Impaired Following commands impaired: Follows one step commands inconsistently     Cueing Cueing Techniques: Verbal cues, Gestural cues, Tactile cues, Visual cues     General Comments General comments (skin integrity, edema, etc.): Wife at bedside. Pt reports some lightheadedness when prompted and had pallor after second attempt at standing. Unable to get BP. Pt was assisted back to supine.        Assessment/Plan    PT Assessment Patient needs continued PT services  PT Problem List Decreased strength;Decreased activity  tolerance;Decreased balance;Decreased range of motion;Decreased mobility;Decreased cognition;Decreased knowledge of use of DME;Decreased safety awareness;Decreased knowledge of precautions;Pain;Decreased coordination       PT Treatment Interventions DME instruction;Gait training;Stair training;Functional mobility training;Therapeutic activities;Therapeutic exercise;Balance training;Patient/family education    PT Goals (Current goals can be found in the Care Plan section)  Acute Rehab PT Goals Patient Stated Goal: to go to rehab to get stronger. PT Goal Formulation: With patient/family Time For Goal Achievement: 01/07/24 Potential to Achieve Goals: Fair    Frequency Min 1X/week     Co-evaluation PT/OT/SLP Co-Evaluation/Treatment: Yes Reason for Co-Treatment: Complexity of the patient's impairments (multi-system involvement);For patient/therapist safety;To address functional/ADL transfers PT goals addressed during session: Mobility/safety with mobility;Balance OT goals addressed during session: Strengthening/ROM;Other (comment) (bed mobility, sitting balance)       AM-PAC PT "6 Clicks" Mobility  Outcome Measure Help needed turning from your back to your side while in a flat bed without using bedrails?: Total Help needed moving from lying on your back to sitting on the side of a flat bed without using bedrails?: Total Help needed moving to and from a bed to a chair (including a wheelchair)?: Total Help needed standing up from a chair using your arms (e.g., wheelchair or bedside chair)?: Total Help needed to walk in hospital room?: Total Help needed climbing 3-5 steps with a railing? : Total 6 Click Score: 6    End of Session Equipment Utilized During Treatment: Gait belt Activity Tolerance: Patient tolerated treatment well Patient left: with call bell/phone within reach;in bed;with bed alarm set;with restraints reapplied;with family/visitor present Nurse Communication: Mobility  status PT Visit Diagnosis: Unsteadiness on feet (R26.81);History of falling (  Z91.81);Difficulty in walking, not elsewhere classified (R26.2)    Time: 1125-1150 PT Time Calculation (min) (ACUTE ONLY): 25 min   Charges:   PT Evaluation $PT Eval Low Complexity: 1 Low   PT General Charges $$ ACUTE PT VISIT: 1 Visit         Harrel Carina, DPT, CLT  Acute Rehabilitation Services Office: [phone] (Secure chat preferred)   Claudia Desanctis 12/24/2023, 2:04 PM

## 2023-12-24 NOTE — [place] FL2 (Signed)
 [place] MEDICAID [place]2 LEVEL OF CARE FORM     IDENTIFICATION  Patient Name: William Todd Birthdate: [birth year] Sex: male Admission Date (Current Location): 11/29/2023  [facility] and [place] Number:  [employer] and Address:  [facility], [address], [place], [place] [address]      Provider Number: [id number]  Attending Physician Name and Address:  Cathren Harsh, MD  Relative Name and Phone Number:  [place], Rackley (spouse) [phone]    Current Level of Care: Hospital Recommended Level of Care: Skilled Nursing Facility Prior Approval Number:    Date Approved/Denied:   PASRR Number: [id number]  Discharge Plan: SNF    Current Diagnoses: Patient Active Problem List   Diagnosis Date Noted   Protein-calorie malnutrition, severe 12/01/2023   Intraparenchymal hemorrhage of brain (HCC) 11/30/2023   Essential hypertension 11/30/2023   History of prostate cancer 11/30/2023   Non-insulin dependent type 2 diabetes mellitus (HCC) 11/30/2023   Normocytic anemia 11/30/2023   History of mood disorder 11/30/2023   HLD (hyperlipidemia) 11/30/2023   Subarachnoid hemorrhage (HCC) 11/30/2023   Acute metabolic encephalopathy 11/30/2023   ICH (intracerebral hemorrhage) (HCC) 11/30/2023   Malnutrition of moderate degree 11/19/2023   History of closed left femoral fracture s/p arthroplasty (HCC) 11/15/2023   Abnormal head CT 11/15/2023   Long-term memory impairment 11/15/2023   Acute kidney injury superimposed on chronic kidney disease (HCC) 11/15/2023   Prostate cancer (HCC) 07/15/2013    Orientation RESPIRATION BLADDER Height & Weight     Self (disoriented to place, time, situation)  Normal Incontinent Weight: 60.6 kg Height:  6' (182.9 cm)  BEHAVIORAL SYMPTOMS/MOOD NEUROLOGICAL BOWEL NUTRITION STATUS      Continent Feeding tube (Osmolite 1.5 at 50 ml/hr)  AMBULATORY STATUS COMMUNICATION OF NEEDS Skin   Limited Assist   Skin  abrasions (L leg abrassion)                       Personal Care Assistance Level of Assistance  Total care, Bathing Bathing Assistance: Maximum assistance Feeding assistance: Limited assistance Dressing Assistance: Maximum assistance Total Care Assistance: Maximum assistance   Functional Limitations Info  Hearing, Speech Sight Info: Adequate Hearing Info: Adequate Speech Info: Adequate    SPECIAL CARE FACTORS FREQUENCY  PT (By licensed PT), OT (By licensed OT)     PT Frequency: 5 x week OT Frequency: 5 x week            Contractures      Additional Factors Info  Code Status, Allergies Code Status Info: DNR Allergies Info: NKA   Insulin Sliding Scale Info: see dc summary       Current Medications (12/24/2023):  This is the current hospital active medication list Current Facility-Administered Medications  Medication Dose Route Frequency Provider Last Rate Last Admin   acetaminophen (TYLENOL) 160 MG/5ML solution 650 mg  650 mg Per Tube Q6H PRN Klus, Jesse L, RPH   650 mg at 12/19/23 1602   acetaminophen (TYLENOL) NICU  ORAL  syringe 160 mg/5 mL  15 mg/kg Oral Q6H PRN Willeen Niece, MD       amantadine (SYMMETREL) 50 MG/5ML solution 100 mg  100 mg Per Tube BID Agarwala, Daleen Bo, MD   100 mg at 12/24/23 1225   amLODipine (NORVASC) tablet 5 mg  5 mg Per Tube Daily Regalado, Belkys A, MD   5 mg at 12/24/23 1022   Ampicillin-Sulbactam (UNASYN) 3 g in sodium chloride 0.9 %  100 mL IVPB  3 g Intravenous Q6H Sheikh, Kateri [place], DO 200 mL/hr at 12/24/23 1227 3 g at 12/24/23 1227   atorvastatin (LIPITOR) tablet 40 mg  40 mg Per Tube Daily Marvel Plan, MD   40 mg at 12/24/23 1022   chlorproMAZINE (THORAZINE) 12.5 mg in sodium chloride 0.9 % 25 mL IVPB  12.5 mg Intravenous Q6H PRN Dolly Rias, MD 50 mL/hr at 12/22/23 1606 Infusion Verify at 12/22/23 1606   feeding supplement (OSMOLITE 1.5 CAL) liquid 1,000 mL  1,000 mL Per Tube Continuous Pia Mau D, PA-C 50 mL/hr at  12/24/23 0833 1,000 mL at 12/24/23 0833   feeding supplement (PROSource TF20) liquid 60 mL  60 mL Per Tube Daily Marvel Plan, MD   60 mL at 12/24/23 1023   free water 200 mL  200 mL Per Tube Q4H Agarwala, Daleen Bo, MD   200 mL at 12/24/23 1226   heparin injection 5,000 Units  5,000 Units Subcutaneous Q12H Lynnell Catalan, MD   5,000 Units at 12/24/23 1022   hydrALAZINE (APRESOLINE) injection 5-20 mg  5-20 mg Intravenous Q4H PRN Marvel Plan, MD       insulin aspart (novoLOG) injection 0-15 Units  0-15 Units Subcutaneous Q4H Migdalia Dk, MD   3 Units at 12/24/23 1225   labetalol (NORMODYNE) injection 10-20 mg  10-20 mg Intravenous Q10 min PRN Marvel Plan, MD   10 mg at 12/01/23 2315   levalbuterol (XOPENEX) nebulizer solution 0.63 mg  0.63 mg Nebulization Q6H PRN Sheikh, Omair Latif, DO       metoCLOPramide (REGLAN) 5 MG/5ML solution 10 mg  10 mg Per Tube TID AC & HS Agarwala, Ravi, MD   10 mg at 12/24/23 1226   metoprolol tartrate (LOPRESSOR) tablet 50 mg  50 mg Per Tube BID Regalado, Belkys A, MD   50 mg at 12/24/23 1022   multivitamin with minerals tablet 1 tablet  1 tablet Per Tube Daily Pia Mau D, PA-C   1 tablet at 12/24/23 1022   ondansetron (ZOFRAN) injection 4 mg  4 mg Intravenous Q6H PRN Sundil, Subrina, MD   4 mg at 11/30/23 6045   Oral care mouth rinse  15 mL Mouth Rinse 4 times per day Lynnell Catalan, MD   15 mL at 12/24/23 1226   Oral care mouth rinse  15 mL Mouth Rinse PRN Agarwala, Daleen Bo, MD       pantoprazole (PROTONIX) injection 40 mg  40 mg Intravenous Q12H Dolly Rias, MD   40 mg at 12/24/23 1022   sodium chloride flush (NS) 0.9 % injection 3 mL  3 mL Intravenous Q12H Sundil, Subrina, MD   3 mL at 12/24/23 1023     Discharge Medications: Please see discharge summary for a list of discharge medications.  Relevant Imaging Results:  Relevant Lab Results:   Additional Information [id number]  Cherre Blanc, RN

## 2023-12-24 NOTE — Progress Notes (Signed)
 [facility]                                                                              William Todd, is a 73 y.o. male, DOB - March 27, 1951, ZOX:[id number] Admit date - 11/29/2023    Outpatient Primary MD for the patient is Knox Royalty, MD  LOS - 24  days  Chief Complaint  Patient presents with   Fall       Brief summary   Patient is a 73 year old male with HTN, recurrents mechanical fall, protein caloric malnutrition, DMT2, CKD3a, prostate cancer 2014 presented from [facility] while he is rehabbing his recent leg surgery and subsequently had a ground-level fall at [facility].  Workup on admission revealed acute intraparenchymal hemorrhage of the left frontal lobe with multiple bilateral small volume SAH.  Neurosurgery and Neurology consulted and because upon repeat imaging his ICH was worsening he was admitted to [facility] for close monitoring.  Underwent multiple repeat head imaging and was then stabilized and Neurosurgery signed off.  Transferred to [facility] service on 12/09/2023.  Hospitalization has been complicated by intermittently Fevers likely from aspiration so was initiated on IV Unasyn ID was consulted. 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 -Repeat CT on 2/17 showed similar appearance of intraparenchymal hematoma in the left frontal lobe with associated mass effect and 5 mm rightward midline shift -LDL 57, hemoglobin A1c 6.1.  2D echo showed EF of 60 to6 5%.  Not able to tolerate MRI. -Seen by neurosurgery, neurology signed off -PEG tube placed on 12/23/2023, will start on tube feeds today -On aspirin 81 mg twice daily PTA, neurology recommending no antithrombotics now due to ICH.   Cerebral Edema:  - Repeat CT 2/17, 5 mm rightward  midline shift.    Aspiration  Pneumonia:   - S/p IV Unasyn x 5 days, COVID, RSV, influenza negative.  -Continue pulmonary toileting, I-S, Xopenex, Atrovent.  No fevers   Essential HTN:  -Continue amlodipine, metoprolol tartrate goal is normotensive.   Diabetes Mellitus Type 2: -Hemoglobin A1c 6.1 -CBG (last 3)  Recent Labs    12/24/23 0757 12/24/23 1109 12/24/23 1223  GLUCAP 93 186* 190*  -Continue sliding scale insulin   Recurrent mechanical falls:  PT/OT had recommended SNF wife wanted to take the patient home but given that patient's mentation is improving -Await PT reevaluation   HyperNa+: - Na 148, continue to monitor mild    AKI on CKD Stage 3a:  -Creatinine improving, close to baseline BUN/Cr    Abnormal LFTs:  -LFTs improving   Normocytic Anemia:  -Slowly improving, for hemoglobin less than 7  -Anemia panel showed iron 32, ferritin 566, folate 11.6, B12 874  GERD -Continue PPI    Pressure injury documentation Left hip stage II, POA Left knee posterior, stage III POA Left foot unstageable, POA Sacrum bilateral stage II Mid coccyx  Severe protein calorie malnutriton Nutrition Problem: Severe Malnutrition Etiology: chronic illness Signs/Symptoms: severe  fat depletion, severe muscle depletion, percent weight loss Interventions: Tube feeding, MVI, Refer to RD note for recommendations  Estimated body mass index is 18.12 kg/m as calculated from the following:   Height as of this encounter: 6' (1.829 m).   Weight as of this encounter: 60.6 kg.  Code Status: DNR DVT Prophylaxis:  heparin injection 5,000 Units Start: 12/06/23 1000 SCDs Start: 11/30/23 0334 Place TED hose Start: 11/30/23 0334   Level of Care: Level of care: Progressive Family Communication: Updated patient Disposition Plan:      Remains inpatient appropriate: Awaiting disposition   Procedures:    Consultants:   Neurosurgery Neurology  Antimicrobials:   Anti-infectives (From admission, onward)    Start      Dose/Rate Route Frequency Ordered Stop   12/23/23 1419  ceFAZolin (ANCEF) IVPB 2g/100 mL premix        over 30 Minutes  Continuous PRN 12/23/23 1419 12/23/23 1419   12/22/23 1745  Ampicillin-Sulbactam (UNASYN) 3 g in sodium chloride 0.9 % 100 mL IVPB        3 g 200 mL/hr over 30 Minutes Intravenous Every 6 hours 12/22/23 1651     12/15/23 1200  Ampicillin-Sulbactam (UNASYN) 3 g in sodium chloride 0.9 % 100 mL IVPB        3 g 200 mL/hr over 30 Minutes Intravenous Every 6 hours 12/15/23 1018 12/20/23 0551   12/15/23 0845  ceFEPIme (MAXIPIME) 2 g in sodium chloride 0.9 % 100 mL IVPB  Status:  Discontinued        2 g 200 mL/hr over 30 Minutes Intravenous Every 12 hours 12/15/23 0745 12/15/23 1018   12/14/23 1515  Ampicillin-Sulbactam (UNASYN) 3 g in sodium chloride 0.9 % 100 mL IVPB  Status:  Discontinued        3 g 200 mL/hr over 30 Minutes Intravenous Every 6 hours 12/14/23 1422 12/15/23 0745          Medications  amantadine  100 mg Per Tube BID   amLODipine  5 mg Per Tube Daily   atorvastatin  40 mg Per Tube Daily   feeding supplement (PROSource TF20)  60 mL Per Tube Daily   free water  200 mL Per Tube Q4H   heparin injection (subcutaneous)  5,000 Units Subcutaneous Q12H   insulin aspart  0-15 Units Subcutaneous Q4H   metoCLOPramide  10 mg Per Tube TID AC & HS   metoprolol tartrate  50 mg Per Tube BID   multivitamin with minerals  1 tablet Per Tube Daily   mouth rinse  15 mL Mouth Rinse 4 times per day   pantoprazole (PROTONIX) IV  40 mg Intravenous Q12H   sodium chloride flush  3 mL Intravenous Q12H      Subjective:   William Todd was seen and examined today.  No acute issues overnight.  Alert and awake, following commands.  PEG tube placed on 3/11  Objective:   Vitals:   12/24/23 0416 12/24/23 0425 12/24/23 1023 12/24/23 1107  BP:  135/76 130/79 132/69  Pulse:  95  81  Resp:  18  17  Temp:  98.2 F (36.8 C)  98.1 F (36.7 C)  TempSrc:  Axillary  Oral  SpO2:   100%  100%  Weight: 60.6 kg     Height:        Intake/Output Summary (Last 24 hours) at 12/24/2023 1502 Last data filed at 12/24/2023 0434 Gross per 24 hour  Intake 831.9 ml  Output 1530 ml  Net -698.1 ml     Wt Readings from Last 3 Encounters:  12/24/23 60.6 kg  11/16/23 66.7 kg  12/23/13 74.7 kg     Exam General: Alert and oriented to self, NAD, appears comfortable Cardiovascular: S1 S2 auscultated,  RRR Respiratory: CTAB Gastrointestinal: Soft, nontender, nondistended, + bowel sounds Ext: no pedal edema bilaterally Neuro: no new deficit Skin: No rashes Psych: Normal affect     Data Reviewed:  I have personally reviewed following labs    CBC Lab Results  Component Value Date   WBC 14.5 (H) 12/24/2023   RBC 2.66 (L) 12/24/2023   HGB 7.9 (L) 12/24/2023   HCT 26.2 (L) 12/24/2023   MCV 98.5 12/24/2023   MCH 29.7 12/24/2023   PLT 186 12/24/2023   MCHC 30.2 12/24/2023   RDW 17.0 (H) 12/24/2023   LYMPHSABS 0.5 (L) 12/24/2023   MONOABS 0.3 12/24/2023   EOSABS 0.0 12/24/2023   BASOSABS 0.0 12/24/2023     Last metabolic panel Lab Results  Component Value Date   NA 148 (H) 12/24/2023   K 4.0 12/24/2023   CL 115 (H) 12/24/2023   CO2 25 12/24/2023   BUN 19 12/24/2023   CREATININE 1.43 (H) 12/24/2023   GLUCOSE 130 (H) 12/24/2023   GFRNONAA 52 (L) 12/24/2023   GFRAA 87 (L) 01/02/2013   CALCIUM 8.9 12/24/2023   PHOS 3.3 12/24/2023   PROT 6.1 (L) 12/24/2023   ALBUMIN 2.0 (L) 12/24/2023   BILITOT 0.7 12/24/2023   ALKPHOS 117 12/24/2023   AST 40 12/24/2023   ALT 66 (H) 12/24/2023   ANIONGAP 8 12/24/2023    CBG (last 3)  Recent Labs    12/24/23 0757 12/24/23 1109 12/24/23 1223  GLUCAP 93 186* 190*      Coagulation Profile: Recent Labs  Lab 12/21/23 1509  INR 1.3*     Radiology Studies: I have personally reviewed the imaging studies  DG CHEST PORT 1 VIEW Result Date: 12/24/2023 CLINICAL DATA:  Shortness of breath. EXAM: PORTABLE CHEST 1  VIEW COMPARISON:  12/23/2023 FINDINGS: Enteric tube has been removed. Stable lung volumes. Stable heart size and mediastinal contours. Mild peribronchial thickening with mild atelectasis in the lung bases. No pneumothorax or pleural effusion. 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 IMPRESSION: Mild peribronchial thickening with mild atelectasis in the lung bases. Electronically Signed   By: Narda Rutherford M.D.   On: 12/24/2023 10:30   IR GASTROSTOMY TUBE MOD SED Result Date: 12/23/2023 INDICATION: Subarachnoid hemorrhage, dysphagia EXAM: FLUOROSCOPIC 20 FRENCH PULL-THROUGH GASTROSTOMY Date:  12/23/2023 12/23/2023 2:48 pm Radiologist:  Judie Petit. Ruel Favors, MD Guidance:  Fluoroscopic MEDICATIONS: Ancef 2 g; Antibiotics were administered within 1 hour of the procedure. Glucagon 0.5 mg IV ANESTHESIA/SEDATION: Versed 2.0 mg IV; Fentanyl 100 mcg IV Moderate Sedation Time:  11 minutes The patient was continuously monitored during the procedure by the interventional radiology nurse under my direct supervision. CONTRAST:  20mL OMNIPAQUE IOHEXOL 300 MG/ML SOLN - administered into the gastric lumen. FLUOROSCOPY: Fluoroscopy Time: 6 minutes 0 seconds (100 mGy). COMPLICATIONS: None immediate. PROCEDURE: Informed consent was obtained from the patient following explanation of the procedure, risks, benefits and alternatives. The patient understands, agrees and consents for the procedure. All questions were addressed. A time out was performed. Maximal barrier sterile technique utilized including caps, mask, sterile gowns, sterile gloves, large sterile drape, hand hygiene, and betadine prep. The left upper quadrant was sterilely prepped and draped. An oral gastric catheter was inserted into the stomach under fluoroscopy. The existing  nasogastric feeding tube was removed. Air was injected into the stomach for insufflation and visualization under fluoroscopy. The air distended stomach was confirmed beneath the anterior  abdominal wall in the frontal and lateral projections. Under sterile conditions and local anesthesia, a 17 gauge trocar needle was utilized to access the stomach percutaneously beneath the left subcostal margin. Needle position was confirmed within the stomach under biplane fluoroscopy. Contrast injection confirmed position also. A single T tack was deployed for gastropexy. Over an Amplatz guide wire, a 9-French sheath was inserted into the stomach. A snare device was utilized to capture the oral gastric catheter. The snare device was pulled retrograde from the stomach up the esophagus and out the oropharynx. The 20-French pull-through gastrostomy was connected to the snare device and pulled antegrade through the oropharynx down the esophagus into the stomach and then through the percutaneous tract external to the patient. The gastrostomy was assembled externally. Contrast injection confirms position in the stomach. Images were obtained for documentation. The patient tolerated procedure well. No immediate complication. IMPRESSION: Fluoroscopic insertion of a 20-French "pull-through" gastrostomy. Electronically Signed   By: Judie Petit.  Shick M.D.   On: 12/23/2023 14:58   DG CHEST PORT 1 VIEW Result Date: 12/23/2023 CLINICAL DATA:  Shortness of breath EXAM: PORTABLE CHEST 1 VIEW COMPARISON:  Film from the previous day. FINDINGS: Cardiac shadow is within normal limits. The lungs are clear bilaterally. Feeding catheter extends into the stomach. IMPRESSION: No active disease. Electronically Signed   By: Alcide Clever M.D.   On: 12/23/2023 11:01   DG Abd Portable 1V Result Date: 12/23/2023 CLINICAL DATA:  Check colonic contrast EXAM: PORTABLE ABDOMEN - 1 VIEW COMPARISON:  12/18/2023 FINDINGS: Contrast is noted throughout the colon. Feeding catheter is noted within the stomach. Some partial overlap of the stomach with the transverse colon is noted similar to that seen on prior CT. IMPRESSION: Transverse colon and gastric  lumen in close proximity. Electronically Signed   By: Alcide Clever M.D.   On: 12/23/2023 11:00       Anuja Manka M.D. [facility] 12/24/2023, 3:02 PM  Available via Epic secure chat 7am-7pm After 7 pm, please refer to night coverage provider listed on amion.

## 2023-12-24 NOTE — Plan of Care (Signed)
  Problem: Skin Integrity: Goal: Risk for impaired skin integrity will decrease Outcome: Progressing   Problem: Nutritional: Goal: Maintenance of adequate nutrition will improve Outcome: Progressing Goal: Progress toward achieving an optimal weight will improve Outcome: Progressing   Problem: Clinical Measurements: Goal: Ability to maintain clinical measurements within normal limits will improve Outcome: Progressing Goal: Will remain free from infection Outcome: Progressing Goal: Diagnostic test results will improve Outcome: Progressing Goal: Respiratory complications will improve Outcome: Progressing Goal: Cardiovascular complication will be avoided Outcome: Progressing   Problem: Safety: Goal: Ability to remain free from injury will improve Outcome: Progressing

## 2023-12-24 NOTE — Evaluation (Signed)
 Occupational Therapy Evaluation Patient Details Name: William Todd MRN: [id number] DOB: 02-21-1951 Today's Date: 12/24/2023   History of Present Illness   Korin C. Clutter is a 73 y.o. male who presented on 2/15 after ground-level fall at SNF. 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 Cortrak replaced on 12/22/2023 and underwent PEG tube placement on 12/23/2023. PMHx includes HTN, recurrent mechanical falls, protein calorie malnutrition, DM2, CKD, and prostate cancer 2014, L foot fx with AFO,  L THA 11/16/23, anxiety, dementia     Clinical Impressions Pt evaluated s/p the admission list above. Pt presents from SNF where he was receiving assistance with ADLs and functional mobility from staff. Upon evaluation, pt was limited by deficits listed below and required up to TOTAL A+2 for all aspects of functional mobility. Based on evaluation, pt will require up to TOTAL A for ADLs. Pt inconsistently followed verbal commands despite MAX multimodal cues. Once seated EOB, pt required up to MAX A with brief periods of MIN A to maintain upright sitting posture due to posterior lean. Pt required TOTAL A +2 to perform STS from EOB but was unable to fully stand upright due to increased trunk and knee flexion. Pt with L gaze and R visual neglect throughout session but was able to come to midline briefly with MAX cueing and physical assistance. OT to continue following pt to address functional needs and cognition with discharge recommendation of follow-up OT services <3hrs/day to maximize functional independence.      If plan is discharge home, recommend the following:   Two people to help with walking and/or transfers;A lot of help with bathing/dressing/bathroom;Assistance with feeding;Assistance with cooking/housework;Direct supervision/assist for medications management;Direct supervision/assist for financial management;Assist for transportation;Help with stairs or ramp  for entrance;Supervision due to cognitive status     Functional Status Assessment   Patient has had a recent decline in their functional status and demonstrates the ability to make significant improvements in function in a reasonable and predictable amount of time.     Equipment Recommendations   Other (comment) (defer)     Recommendations for Other Services         Precautions/Restrictions   Precautions Precautions: Fall;Other (comment) (seizure) Precaution Booklet Issued: No Restrictions Weight Bearing Restrictions Per Provider Order: No     Mobility Bed Mobility Overal bed mobility: Needs Assistance Bed Mobility: Supine to Sit, Sit to Supine     Supine to sit: Total assist, +2 for physical assistance Sit to supine: +2 for physical assistance, Total assist   General bed mobility comments: Pt with good initiation moving LLE OOB but inconsistently followed 1-step verbal commands and overall required up to TOTAL A +2 to complete bed mobility tasks    Transfers Overall transfer level: Needs assistance Equipment used: 2 person hand held assist Transfers: Sit to/from Stand Sit to Stand: Total assist, +2 physical assistance           General transfer comment: +2 for safety. Pt unable to fully stand upright. Pt with increased knee and trunk flexion      Balance Overall balance assessment: Needs assistance Sitting-balance support: Bilateral upper extremity supported, Feet supported Sitting balance-Leahy Scale: Poor Sitting balance - Comments: static sitting EOB Postural control: Posterior lean Standing balance support: Bilateral upper extremity supported Standing balance-Leahy Scale: Zero Standing balance comment: +2 support  ADL either performed or assessed with clinical judgement   ADL Overall ADL's : Needs assistance/impaired Eating/Feeding: NPO   Grooming: Maximal assistance;Sitting   Upper Body Bathing:  Maximal assistance;Sitting   Lower Body Bathing: Sitting/lateral leans;Maximal assistance   Upper Body Dressing : Maximal assistance;Sitting   Lower Body Dressing: Total assistance;Sitting/lateral leans   Toilet Transfer: Total assistance;+2 for physical assistance;Squat-pivot;BSC/3in1   Toileting- Clothing Manipulation and Hygiene: Total assistance;Sitting/lateral lean       Functional mobility during ADLs: Total assistance;+2 for physical assistance General ADL Comments: Pt inconsistently followed verbal commands with verbal/tactile cues. Pt with R side neglect, limited ROM in R shoulder, and generalized weakness in BUE.     Vision Baseline Vision/History: 1 Wears glasses Ability to See in Adequate Light: 1 Impaired Patient Visual Report: No change from baseline Vision Assessment?: Vision impaired- to be further tested in functional context Additional Comments: Pt with significant R side neglect and L gaze. Pt was able to come to midline briefly with MAX stimulation/assistance and cueing.     Perception Perception: Impaired Preception Impairment Details: Inattention/Neglect Perception-Other Comments: R side   Praxis Praxis: Not tested       Pertinent Vitals/Pain Pain Assessment Pain Assessment: No/denies pain     Extremity/Trunk Assessment Upper Extremity Assessment Upper Extremity Assessment: Generalized weakness RUE Deficits / Details: less than 50% shoulder flexion AROM, decreased grip strength RUE Coordination: decreased gross motor   Lower Extremity Assessment Lower Extremity Assessment: Defer to PT evaluation       Communication Communication Communication: Impaired Factors Affecting Communication: Difficulty expressing self   Cognition Arousal: Alert Behavior During Therapy: Flat affect Cognition: History of cognitive impairments, Cognition impaired   Orientation impairments: Place, Time, Situation Awareness: Online awareness impaired, Intellectual  awareness impaired Memory impairment (select all impairments): Short-term memory   Executive functioning impairment (select all impairments): Initiation, Organization, Sequencing, Reasoning, Problem solving OT - Cognition Comments: Pt followed 1-step verbal commands inconsistently, improvement in communication, oriented only to self.                 Following commands: Impaired Following commands impaired: Follows one step commands inconsistently     Cueing  General Comments   Cueing Techniques: Verbal cues;Gestural cues;Tactile cues;Visual cues  VSS; wife at bedside   Exercises     Shoulder Instructions      Home Living Family/patient expects to be discharged to:: Skilled nursing facility Living Arrangements: Spouse/significant other Available Help at Discharge: Family Type of Home: House Home Access: Stairs to enter [employer] of Steps: 1 from the garage into the house Entrance Stairs-Rails: Right;Left Home Layout: Two level;1/2 bath on main level;Able to live on main level with bedroom/bathroom Alternate Level Stairs-Number of Steps: 13 upstiars to bed/bath. Does have a murphy bed downstairs in a sunken room - 1 step down.   Bathroom Shower/Tub: Tub/shower unit;Walk-in shower   Bathroom Toilet: Standard     Home Equipment: Cane - single Librarian, academic (2 wheels)          Prior Functioning/Environment Prior Level of Function : Needs assist       Physical Assist : ADLs (physical);Mobility (physical) Mobility (physical): Transfers ADLs (physical): Bathing;Dressing;Toileting;IADLs Mobility Comments: has AFO for L foot, used cane before L hip fx ADLs Comments: requires (A) since 2/4 admission due to hip surgery. uncertain how indep pt demonstrates at SNF due to staff always helping    OT Problem List: Decreased strength;Decreased range of motion;Decreased activity tolerance;Impaired balance (sitting and/or standing);Impaired  vision/perception;Decreased  safety awareness;Decreased cognition;Decreased knowledge of use of DME or AE   OT Treatment/Interventions: Self-care/ADL training;Neuromuscular education;Energy conservation;DME and/or AE instruction;Manual therapy;Therapeutic activities;Cognitive remediation/compensation;Visual/perceptual remediation/compensation;Balance training;Patient/family education      OT Goals(Current goals can be found in the care plan section)   Acute Rehab OT Goals Patient Stated Goal: none stated OT Goal Formulation: With patient Time For Goal Achievement: 12/29/23 Potential to Achieve Goals: Fair ADL Goals Pt Will Perform Grooming: with mod assist;sitting Pt Will Transfer to Toilet: with max assist;squat pivot transfer;bedside commode   OT Frequency:  Min 1X/week    Co-evaluation PT/OT/SLP Co-Evaluation/Treatment: Yes Reason for Co-Treatment: Complexity of the patient's impairments (multi-system involvement);For patient/therapist safety;To address functional/ADL transfers PT goals addressed during session: Mobility/safety with mobility;Balance OT goals addressed during session: Strengthening/ROM;Other (comment) (bed mobility, sitting balance)      AM-PAC OT "6 Clicks" Daily Activity     Outcome Measure Help from another person eating meals?: Total Help from another person taking care of personal grooming?: A Lot Help from another person toileting, which includes using toliet, bedpan, or urinal?: Total Help from another person bathing (including washing, rinsing, drying)?: A Lot Help from another person to put on and taking off regular upper body clothing?: A Lot Help from another person to put on and taking off regular lower body clothing?: Total 6 Click Score: 9   End of Session Equipment Utilized During Treatment: Gait belt Nurse Communication: Mobility status  Activity Tolerance: Patient tolerated treatment well Patient left: in bed;with call bell/phone within  reach;with bed alarm set;with family/visitor present  OT Visit Diagnosis: Unsteadiness on feet (R26.81);Other abnormalities of gait and mobility (R26.89);History of falling (Z91.81);Muscle weakness (generalized) (M62.81)                Time: 1125-1150 OT Time Calculation (min): 25 min Charges:  OT General Charges $OT Visit: 1 Visit OT Evaluation $OT Eval Moderate Complexity: 1 [address], MOTS  Kevan Ny 12/24/2023, 1:47 PM

## 2023-12-24 NOTE — Progress Notes (Signed)
 No handover given. Notified charge nurse.

## 2023-12-24 NOTE — Progress Notes (Signed)
 Speech Language Pathology Treatment: Dysphagia  Patient Details Name: William Todd MRN: [id number] DOB: [birth year] Today's Date: 12/24/2023 Time: 0912-0919 SLP Time Calculation (min) (ACUTE ONLY): 7 min  Assessment / Plan / Recommendation Clinical Impression  Pt seen for ongoing dysphagia management.  Pt with PEG placement yesterday.  He has been more alert the past few days per chart review and nursing report.  Pt was asleep on SLP arrival, but able to rouse.  He eagerly accepted PO trials today and at end of session, spoke with SLP to agree to treatment plan("okay").  Pt tolerated initial trial of ice chip.  With ice chips, prompt mastication observed with efficient oral phase and complete clearance.  With thin liquid by spoon there was immediate cough and multiple swallows observed.  Pt continued to cough frequently with additional trials of ice chips and puree.  Pt exhibited good oral clearance of puree and multiple swallows.  Recommend MBSS prior to initiation of POs.  Will tentatively plan for objective swallow study next date as long as pt is able to maintain alertness.  Recommend pt remain NPO with alternate means of nutrition, hydration, and medication. Pt may have ice chips in moderation, after good oral care, when fully awake/alert, with upright positioning and direct supervision.    HPI HPI: William Todd is a 73 yo male admitted from [facility] 11/29/23 after a fall. PMH: HTN, recurrent falls, malnutrition, DM2, CKD, prostate cancer (2014). Acute left frontal lobe hematoma with multiple bilateral small SAH. CXR concerning for aspiration PNA. PEG placed 3/11      SLP Plan  MBS      Recommendations for follow up therapy are one component of a multi-disciplinary discharge planning process, led by the attending physician.  Recommendations may be updated based on patient status, additional functional criteria and insurance authorization.    Recommendations  Diet  recommendations: NPO Medication Administration: Via alternative means                  Oral care QID;Oral care prior to ice chip/H20   Frequent or constant Supervision/Assistance Dysphagia, unspecified (R13.10)     MBS     Kerrie Pleasure MA, CCC-SLP Acute Rehabilitation Services Office: [phone] 12/24/2023, 9:25 AM

## 2023-12-25 ENCOUNTER — Inpatient Hospital Stay (HOSPITAL_COMMUNITY)

## 2023-12-25 DIAGNOSIS — I619 Nontraumatic intracerebral hemorrhage, unspecified: Secondary | ICD-10-CM | POA: Diagnosis not present

## 2023-12-25 DIAGNOSIS — I611 Nontraumatic intracerebral hemorrhage in hemisphere, cortical: Secondary | ICD-10-CM | POA: Diagnosis not present

## 2023-12-25 DIAGNOSIS — W19XXXA Unspecified fall, initial encounter: Secondary | ICD-10-CM | POA: Diagnosis not present

## 2023-12-25 DIAGNOSIS — I609 Nontraumatic subarachnoid hemorrhage, unspecified: Secondary | ICD-10-CM | POA: Diagnosis not present

## 2023-12-25 LAB — CBC
HCT: 24 % — ABNORMAL LOW (ref 39.0–52.0)
Hemoglobin: 7.3 g/dL — ABNORMAL LOW (ref 13.0–17.0)
MCH: 30.3 pg (ref 26.0–34.0)
MCHC: 30.4 g/dL (ref 30.0–36.0)
MCV: 99.6 fL (ref 80.0–100.0)
Platelets: 170 10*3/uL (ref 150–400)
RBC: 2.41 MIL/uL — ABNORMAL LOW (ref 4.22–5.81)
RDW: 17.2 % — ABNORMAL HIGH (ref 11.5–15.5)
WBC: 13.9 10*3/uL — ABNORMAL HIGH (ref 4.0–10.5)
nRBC: 0 % (ref 0.0–0.2)

## 2023-12-25 LAB — GLUCOSE, CAPILLARY
Glucose-Capillary: 127 mg/dL — ABNORMAL HIGH (ref 70–99)
Glucose-Capillary: 205 mg/dL — ABNORMAL HIGH (ref 70–99)
Glucose-Capillary: 235 mg/dL — ABNORMAL HIGH (ref 70–99)
Glucose-Capillary: 231 mg/dL — ABNORMAL HIGH (ref 70–99)
Glucose-Capillary: 231 mg/dL — ABNORMAL HIGH (ref 70–99)
Glucose-Capillary: 163 mg/dL — ABNORMAL HIGH (ref 70–99)

## 2023-12-25 LAB — BASIC METABOLIC PANEL
Anion gap: 11 (ref 5–15)
BUN: 21 mg/dL (ref 8–23)
CO2: 26 mmol/L (ref 22–32)
Calcium: 8.2 mg/dL — ABNORMAL LOW (ref 8.9–10.3)
Chloride: 110 mmol/L (ref 98–111)
Creatinine, Ser: 1.31 mg/dL — ABNORMAL HIGH (ref 0.61–1.24)
GFR, Estimated: 57 mL/min — ABNORMAL LOW (ref >60)
Glucose, Bld: 284 mg/dL — ABNORMAL HIGH (ref 70–99)
Potassium: 3.7 mmol/L (ref 3.5–5.1)
Sodium: 147 mmol/L — ABNORMAL HIGH (ref 135–145)

## 2023-12-25 LAB — PREPARE RBC (CROSSMATCH)

## 2023-12-25 MED ORDER — INSULIN GLARGINE 100 UNIT/ML ~~LOC~~ SOLN
7.0000 [IU] | Freq: Every day | SUBCUTANEOUS | Status: DC
  Administered 2023-12-25 – 2023-12-27 (×3): 7 [IU] via SUBCUTANEOUS
  Filled 2023-12-25 (×3): qty 0.07

## 2023-12-25 MED ORDER — SODIUM CHLORIDE 0.9% IV SOLUTION: Freq: Once | INTRAVENOUS | Status: DC

## 2023-12-25 NOTE — Progress Notes (Addendum)
 [facility]                                                                              William Todd, is a 73 y.o. male, DOB - Sep 20, 1951, LOV:[id number] Admit date - 11/29/2023    Outpatient Primary MD for the patient is Knox Royalty, MD  LOS - 25  days  Chief Complaint  Patient presents with   Fall       Brief summary   Patient is a 73 year old male with HTN, recurrents mechanical fall, protein caloric malnutrition, DMT2, CKD3a, prostate cancer 2014 presented from [facility] while he is rehabbing his recent leg surgery and subsequently had a ground-level fall at [facility].  Workup on admission revealed acute intraparenchymal hemorrhage of the left frontal lobe with multiple bilateral small volume SAH.  Neurosurgery and Neurology consulted and because upon repeat imaging his ICH was worsening he was admitted to CCM for close monitoring.  Underwent multiple repeat head imaging and was then stabilized and Neurosurgery signed off.  Transferred to [facility] service on 12/09/2023.  Hospitalization has been complicated by intermittently Fevers likely from aspiration so was initiated on IV Unasyn ID was consulted. 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 -Repeat CT on 2/17 showed similar appearance of intraparenchymal hematoma in the left frontal lobe with associated mass effect and 5 mm rightward midline shift -LDL 57, hemoglobin A1c 6.1.  2D echo showed EF of 60 to6 5%.  Not able to tolerate MRI. -Seen by neurosurgery, neurology signed off -PEG tube placed on 12/23/2023, tube feeds started on 3/12, tolerating tube feeds -On aspirin 81 mg twice daily PTA, neurology recommending no antithrombotics now due to ICH.   Cerebral Edema:  - Repeat CT 2/17, 5 mm rightward  midline  shift.    Aspiration Pneumonia:   - S/p IV Unasyn x 5 days, COVID, RSV, influenza negative.  -Continue pulmonary toileting, I-S, Xopenex, Atrovent.  No fevers   Essential HTN:  -Continue amlodipine, metoprolol tartrate goal is normotensive.   Diabetes Mellitus Type 2: -Hemoglobin A1c 6.1 -CBG (last 3)   CBG (last 3)  Recent Labs    12/24/23 2322 12/25/23 0314 12/25/23 0744  GLUCAP 203* 127* 205*   CBG is elevated likely due to tube feeds, will add Semglee 7 units daily -Continue moderate SSI    Recurrent mechanical falls:  PT/OT had recommended SNF wife wanted to take the patient home but given that patient's mentation is improving -Await PT reevaluation   Hypernatremia -Na improving -Continue free water 200 cc every 4 hours   AKI on CKD Stage 3a:  -Baseline creatinine 1.4-1.5 -Awaiting BMET   Abnormal LFTs:  -LFTs improving   Normocytic Anemia:  --Anemia panel showed iron 32, ferritin 566, folate 11.6, B12 874 -Hemoglobin 7.3, will transfuse if Hb <7  GERD -Continue PPI    Pressure injury documentation Left hip stage II, POA Left knee posterior, stage III POA Left foot unstageable,  POA Sacrum bilateral stage II Mid coccyx  Severe protein calorie malnutriton Nutrition Problem: Severe Malnutrition Etiology: chronic illness Signs/Symptoms: severe fat depletion, severe muscle depletion, percent weight loss Interventions: Tube feeding, MVI, Refer to RD note for recommendations  Estimated body mass index is 19.17 kg/m as calculated from the following:   Height as of this encounter: 6' (1.829 m).   Weight as of this encounter: 64.1 kg.  Code Status: DNR DVT Prophylaxis:  heparin injection 5,000 Units Start: 12/06/23 1000 SCDs Start: 11/30/23 0334 Place TED hose Start: 11/30/23 0334   Level of Care: Level of care: Progressive Family Communication: Updated patient Disposition Plan:      Remains inpatient appropriate: Awaiting SNF   Procedures:     Consultants:   Neurosurgery Neurology Interventional neurology  Antimicrobials:   Anti-infectives (From admission, onward)    Start     Dose/Rate Route Frequency Ordered Stop   12/23/23 1419  ceFAZolin (ANCEF) IVPB 2g/100 mL premix        over 30 Minutes  Continuous PRN 12/23/23 1419 12/23/23 1419   12/22/23 1745  Ampicillin-Sulbactam (UNASYN) 3 g in sodium chloride 0.9 % 100 mL IVPB        3 g 200 mL/hr over 30 Minutes Intravenous Every 6 hours 12/22/23 1651     12/15/23 1200  Ampicillin-Sulbactam (UNASYN) 3 g in sodium chloride 0.9 % 100 mL IVPB        3 g 200 mL/hr over 30 Minutes Intravenous Every 6 hours 12/15/23 1018 12/20/23 0551   12/15/23 0845  ceFEPIme (MAXIPIME) 2 g in sodium chloride 0.9 % 100 mL IVPB  Status:  Discontinued        2 g 200 mL/hr over 30 Minutes Intravenous Every 12 hours 12/15/23 0745 12/15/23 1018   12/14/23 1515  Ampicillin-Sulbactam (UNASYN) 3 g in sodium chloride 0.9 % 100 mL IVPB  Status:  Discontinued        3 g 200 mL/hr over 30 Minutes Intravenous Every 6 hours 12/14/23 1422 12/15/23 0745          Medications  amantadine  100 mg Per Tube BID   amLODipine  5 mg Per Tube Daily   atorvastatin  40 mg Per Tube Daily   feeding supplement (PROSource TF20)  60 mL Per Tube Daily   free water  200 mL Per Tube Q4H   heparin injection (subcutaneous)  5,000 Units Subcutaneous Q12H   insulin aspart  0-15 Units Subcutaneous Q4H   metoCLOPramide  10 mg Per Tube TID AC & HS   metoprolol tartrate  50 mg Per Tube BID   multivitamin with minerals  1 tablet Per Tube Daily   mouth rinse  15 mL Mouth Rinse 4 times per day   pantoprazole (PROTONIX) IV  40 mg Intravenous Q12H   sodium chloride flush  3 mL Intravenous Q12H      Subjective:   William Todd was seen and examined today.  No acute issues, tolerating tube feeds, at 50 cc an hour.  Following commands.  PEG tube placed on 3/11.    Objective:   Vitals:   12/24/23 2320 12/25/23 0311  12/25/23 0312 12/25/23 0742  BP: 125/65 138/65  134/85  Pulse: 85 82  90  Resp: 18     Temp: 98.2 F (36.8 C) 98.9 F (37.2 C)  98.4 F (36.9 C)  TempSrc: Axillary Axillary  Axillary  SpO2: 100% 100%  100%  Weight:   64.1 kg   Height:  Intake/Output Summary (Last 24 hours) at 12/25/2023 1058 Last data filed at 12/25/2023 0326 Gross per 24 hour  Intake 999.92 ml  Output 1000 ml  Net -0.08 ml     Wt Readings from Last 3 Encounters:  12/25/23 64.1 kg  11/16/23 66.7 kg  12/23/13 74.7 kg   Physical Exam General: Ill-appearing, alert and awake, appears oriented to self Cardiovascular: S1 S2 clear, RRR.  Respiratory: CTAB, no wheezing Gastrointestinal: Soft, nontender, nondistended, NBS, PEG tube+ Ext: no pedal edema bilaterally Neuro: hemiparesis on the left, opens eyes to voice Psych: Normal affect    Data Reviewed:  I have personally reviewed following labs    CBC Lab Results  Component Value Date   WBC 14.5 (H) 12/24/2023   RBC 2.66 (L) 12/24/2023   HGB 7.9 (L) 12/24/2023   HCT 26.2 (L) 12/24/2023   MCV 98.5 12/24/2023   MCH 29.7 12/24/2023   PLT 186 12/24/2023   MCHC 30.2 12/24/2023   RDW 17.0 (H) 12/24/2023   LYMPHSABS 0.5 (L) 12/24/2023   MONOABS 0.3 12/24/2023   EOSABS 0.0 12/24/2023   BASOSABS 0.0 12/24/2023     Last metabolic panel Lab Results  Component Value Date   NA 148 (H) 12/24/2023   K 4.0 12/24/2023   CL 115 (H) 12/24/2023   CO2 25 12/24/2023   BUN 19 12/24/2023   CREATININE 1.43 (H) 12/24/2023   GLUCOSE 130 (H) 12/24/2023   GFRNONAA 52 (L) 12/24/2023   GFRAA 87 (L) 01/02/2013   CALCIUM 8.9 12/24/2023   PHOS 3.3 12/24/2023   PROT 6.1 (L) 12/24/2023   ALBUMIN 2.0 (L) 12/24/2023   BILITOT 0.7 12/24/2023   ALKPHOS 117 12/24/2023   AST 40 12/24/2023   ALT 66 (H) 12/24/2023   ANIONGAP 8 12/24/2023    CBG (last 3)  Recent Labs    12/24/23 2322 12/25/23 0314 12/25/23 0744  GLUCAP 203* 127* 205*      Coagulation  Profile: Recent Labs  Lab 12/21/23 1509  INR 1.3*     Radiology Studies: I have personally reviewed the imaging studies  DG CHEST PORT 1 VIEW Result Date: 12/24/2023 CLINICAL DATA:  Shortness of breath. EXAM: PORTABLE CHEST 1 VIEW COMPARISON:  12/23/2023 FINDINGS: Enteric tube has been removed. Stable lung volumes. Stable heart size and mediastinal contours. Mild peribronchial thickening with mild atelectasis in the lung bases. No pneumothorax or pleural effusion. 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 IMPRESSION: Mild peribronchial thickening with mild atelectasis in the lung bases. Electronically Signed   By: Narda Rutherford M.D.   On: 12/24/2023 10:30   IR GASTROSTOMY TUBE MOD SED Result Date: 12/23/2023 INDICATION: Subarachnoid hemorrhage, dysphagia EXAM: FLUOROSCOPIC 20 FRENCH PULL-THROUGH GASTROSTOMY Date:  12/23/2023 12/23/2023 2:48 pm Radiologist:  Judie Petit. Ruel Favors, MD Guidance:  Fluoroscopic MEDICATIONS: Ancef 2 g; Antibiotics were administered within 1 hour of the procedure. Glucagon 0.5 mg IV ANESTHESIA/SEDATION: Versed 2.0 mg IV; Fentanyl 100 mcg IV Moderate Sedation Time:  11 minutes The patient was continuously monitored during the procedure by the interventional radiology nurse under my direct supervision. CONTRAST:  20mL OMNIPAQUE IOHEXOL 300 MG/ML SOLN - administered into the gastric lumen. FLUOROSCOPY: Fluoroscopy Time: 6 minutes 0 seconds (100 mGy). COMPLICATIONS: None immediate. PROCEDURE: Informed consent was obtained from the patient following explanation of the procedure, risks, benefits and alternatives. The patient understands, agrees and consents for the procedure. All questions were addressed. A time out was performed. Maximal barrier sterile technique utilized including caps, mask, sterile gowns, sterile gloves, large  sterile drape, hand hygiene, and betadine prep. The left upper quadrant was sterilely prepped and draped. An oral gastric catheter was inserted into  the stomach under fluoroscopy. The existing nasogastric feeding tube was removed. Air was injected into the stomach for insufflation and visualization under fluoroscopy. The air distended stomach was confirmed beneath the anterior abdominal wall in the frontal and lateral projections. Under sterile conditions and local anesthesia, a 17 gauge trocar needle was utilized to access the stomach percutaneously beneath the left subcostal margin. Needle position was confirmed within the stomach under biplane fluoroscopy. Contrast injection confirmed position also. A single T tack was deployed for gastropexy. Over an Amplatz guide wire, a 9-French sheath was inserted into the stomach. A snare device was utilized to capture the oral gastric catheter. The snare device was pulled retrograde from the stomach up the esophagus and out the oropharynx. The 20-French pull-through gastrostomy was connected to the snare device and pulled antegrade through the oropharynx down the esophagus into the stomach and then through the percutaneous tract external to the patient. The gastrostomy was assembled externally. Contrast injection confirms position in the stomach. Images were obtained for documentation. The patient tolerated procedure well. No immediate complication. IMPRESSION: Fluoroscopic insertion of a 20-French "pull-through" gastrostomy. Electronically Signed   By: Judie Petit.  Shick M.D.   On: 12/23/2023 14:58       Ranald Alessio M.D. [facility] 12/25/2023, 10:58 AM  Available via Epic secure chat 7am-7pm After 7 pm, please refer to night coverage provider listed on amion.

## 2023-12-25 NOTE — Evaluation (Addendum)
 Modified Barium Swallow Study  Patient Details  Name: William Todd MRN: [id number] Date of Birth: 03-27-1951  Today's Date: 12/25/2023  Modified Barium Swallow completed.  Full report located under Chart Review in the Imaging Section.  History of Present Illness William Todd is a 73 yo male admitted from [facility] 11/29/23 after a fall. PMH: HTN, recurrent falls, malnutrition, DM2, CKD, prostate cancer (2014). Acute left frontal lobe hematoma with multiple bilateral small SAH. CXR concerning for aspiration PNA. PEG placed 3/11   Clinical Impression Pt scored a DIGEST rating of 2, moderate pharyngeal dysphagia. Oral dysphagia was also present.  He demonstrated fluctuating swallowing, hampered by his cognitive impairments, including poor initiation and difficulty with sustaining his attention during the study. There was frequent oral holding and reduced oral movement/propulsion into the pharynx.  There was inconsistent base-of-tongue and pharyngeal wall contraction, leading to variations in degree of residue.  There was diminished laryngeal mobility, inconsistent epiglottic closure, and intermittent penetration of materials into the laryngeal vestibule.  Mixed consistencies collected in the pyriforms and on two occasions spilled into the airway with no response from the patient.  Pt could not follow commands for compensatory strategies or a cued cough.  There was no single consistency that can be deemed safer than the others. However, with opportunities to swallow, pt may show some gains.  For now, continue ice chips and allow teaspoons of water with supervision.  SLP will follow while in acute care; he will benefit from swallowing tx at next level of care.  DIGEST Swallow Severity Rating*  Safety: 2  Efficiency:2  Overall Pharyngeal Swallow Severity: 2 moderate 1: mild; 2: moderate; 3: severe; 4: profound  *The Dynamic Imaging Grade of Swallowing Toxicity is standardized for the head and  neck cancer population, however, demonstrates promising clinical applications across populations to standardize the clinical rating of pharyngeal swallow safety and severity.    Factors that may increase risk of adverse event in presence of aspiration [name] & [name] 2021): Reduced cognitive function;Limited mobility;Dependence for feeding and/or oral hygiene;Weak cough;Aspiration of thick, dense, and/or acidic materials  Swallow Evaluation Recommendations Recommendations: NPO;Ice chips PRN after oral care (teaspoons of water after oral care) Liquid Administration via: Spoon Medication Administration: Via alternative means Supervision: Full assist for feeding Swallowing strategies  : Small bites/sips Oral care recommendations: Oral care QID (4x/day) Caregiver Recommendations: Have oral suction available     [name] [name], MA CCC/SLP Clinical Specialist - Acute Care SLP Acute Rehabilitation Services Office number [phone]  [name] 12/25/2023,2:49 PM

## 2023-12-25 NOTE — Plan of Care (Signed)
  Problem: Metabolic: Goal: Ability to maintain appropriate glucose levels will improve Outcome: Progressing   Problem: Nutritional: Goal: Maintenance of adequate nutrition will improve Outcome: Progressing   Problem: Skin Integrity: Goal: Risk for impaired skin integrity will decrease Outcome: Progressing   Problem: Tissue Perfusion: Goal: Adequacy of tissue perfusion will improve Outcome: Progressing   Problem: Nutrition: Goal: Adequate nutrition will be maintained Outcome: Progressing   Problem: Elimination: Goal: Will not experience complications related to bowel motility Outcome: Progressing   Problem: Pain Managment: Goal: General experience of comfort will improve and/or be controlled Outcome: Progressing   Problem: Safety: Goal: Ability to remain free from injury will improve Outcome: Progressing

## 2023-12-25 NOTE — Inpatient Diabetes Management (Signed)
 Inpatient Diabetes Program Recommendations  AACE/ADA: New Consensus Statement on Inpatient Glycemic Control (2015)  Target Ranges:  Prepandial:   less than 140 mg/dL      Peak postprandial:   less than 180 mg/dL (1-2 hours)      Critically ill patients:  140 - 180 mg/dL   Lab Results  Component Value Date   GLUCAP 235 (H) 12/25/2023   HGBA1C 6.1 (H) 12/02/2023    Review of Glycemic Control  Latest Reference Range & Units 12/24/23 20:55 12/24/23 23:22 12/25/23 03:14 12/25/23 07:44 12/25/23 11:24  Glucose-Capillary 70 - 99 mg/dL 098 (H) 119 (H) 147 (H) 205 (H) 235 (H)   Diabetes history: DM 2 Outpatient Diabetes medications:  Januvia 50 mg daily Current orders for Inpatient glycemic control:  Novolog 0-15 units q 4 hours Osmolite 50 ml/hr Lantus 7 units daily  Inpatient Diabetes Program Recommendations:    Note elevated blood sugars with tube feeds.  May consider adding Novolog tube feed coverage 2-3 units q 4 hours to cover CHO in feeds if blood sugars remain>180 mg/dL.   Thanks,  Lorenza Cambridge, RN, BC-ADM Inpatient Diabetes Coordinator Pager [phone]  (8a-5p)

## 2023-12-25 NOTE — Progress Notes (Signed)
 Nutrition Follow-up  DOCUMENTATION CODES:  Severe malnutrition in context of chronic illness  INTERVENTION:  Continue tube feeding via Cortrak: Osmolite 1.5 (or equivalent) at 50 ml/h (1200 ml per day) Prosource TF20 60 ml daily Free Water Flushes Q4 hours Provides 1880 kcal, 95 gm protein, free water ( free water daily = TF+flush)  If pt to transition to bolus feeds, recommend the following: 5 bolus feeds of Osmolite 1.5 (or equivalent) daily, 1 carton each feed (5 total daily) Start with 1/2 of a carton x 2 feeds. If tolerated, advance to full carton at each feed.  Free Water Flushes Q4 hours This will provide 1775 kcal, and 75g protein, and free water ( free water, TF+flush) MVI w/ minerals daily Monitor SLP notes for advancement in oral diet  NUTRITION DIAGNOSIS:  Severe Malnutrition related to chronic illness as evidenced by severe fat depletion, severe muscle depletion, percent weight loss. - Remains applicable   GOAL:  Patient will meet greater than or equal to 90% of their needs - Meeting with tube feeds  MONITOR:  Diet advancement, TF tolerance, Weight trends, Labs, Skin  REASON FOR ASSESSMENT:  Consult Enteral/tube feeding initiation and management, Assessment of nutrition requirement/status  ASSESSMENT:  Pt recently discharged on 2/6 after mechanical fall at home resulting in L femoral neck fracture requiring L hip arthroplasty. Discharged to [facility] where he fell again resulting in ICH. PMH: CAD, HLD, T2DM, HTN, anxiety, and prostate cancer (radiation 09/29/13-11/25/13). No formal dx of dementia, however with poor short term memory.  2/16 admitted to [facility] w/ ICH with bilateral [facility] 2/17 Cortrak placed/TF initiated/ pt pulled out Cortrak 2/19 Cortrak replaced/TF re-initiated/EEG 2/25 transferred out of ICU  3/7 - SLP continues to recommend NPO 3/10 - cortrak replaced 3/11 - PEG placed in IR  Pt out of room at the time of  assessment for MBS. RN reports pt more alert today and somewhat oriented. Noted that PO diet was unable to be recommended at this time but pt able to have ice chips occasionally to practice swallowing.   Pt with improvement with therapy over the last few days, now pursuing SNF rehab. Will continue continuous feeds for now but bolus recommendations in note can be utilized if pt ready for discharge. May also be adjusted to bolus once at SNF.  Admit weight: 60 kg Current weight: 64.1 kg   Nutritionally Relevant Medications: Scheduled Meds:  amantadine  100 mg Per Tube BID   atorvastatin  40 mg Per Tube Daily   PROSource TF20  60 mL Per Tube Daily   free water  200 mL Per Tube Q4H   insulin aspart  0-15 Units Subcutaneous Q4H   metoCLOPramide  10 mg Per Tube TID AC & HS   multivitamin with minerals  1 tablet Per Tube Daily   pantoprazole IV  40 mg Intravenous Q12H   Continuous Infusions:  ampicillin-sulbactam (UNASYN) IV 3 g (12/25/23 0514)   chlorproMAZINE (THORAZINE) 12.5 mg in sodium chloride 0.9 % 25 mL IVPB 50 mL/hr at 12/25/23 0225   feeding supplement (OSMOLITE 1.5 CAL) 1,000 mL (12/25/23 0845)   PRN Meds: ondansetron  Labs Reviewed: Na 148, chloride 115 creatinine 1.43 CBG ranges from 93-268 mg/dL over the last 24 hours HgbA1c 6.1% (2/18)   Diet Order:   Diet Order             Diet NPO time specified  Diet effective now  EDUCATION NEEDS:  Not appropriate for education at this time  Skin:  Skin Assessment: Skin Integrity Issues: Skin Integrity Issues:: Stage III, Stage II Stage II: L hip arthroplasty site Stage III: L posterior knee  Last BM:  3/12 - type 6  Height:  Ht Readings from Last 1 Encounters:  12/07/23 6' (1.829 m)   Weight:  Wt Readings from Last 1 Encounters:  12/25/23 64.1 kg   Ideal Body Weight:  80.9 kg  BMI:  Body mass index is 19.17 kg/m.  Estimated Nutritional Needs:  Kcal:  1800-2000kcal Protein:   90-100g Fluid:  >1.8L/day    [name], RD, LDN Registered Dietitian II Please reach out via secure chat Weekend on-call pager # available in [facility]

## 2023-12-26 DIAGNOSIS — I611 Nontraumatic intracerebral hemorrhage in hemisphere, cortical: Secondary | ICD-10-CM | POA: Diagnosis not present

## 2023-12-26 DIAGNOSIS — I619 Nontraumatic intracerebral hemorrhage, unspecified: Secondary | ICD-10-CM | POA: Diagnosis not present

## 2023-12-26 DIAGNOSIS — W19XXXA Unspecified fall, initial encounter: Secondary | ICD-10-CM | POA: Diagnosis not present

## 2023-12-26 DIAGNOSIS — I609 Nontraumatic subarachnoid hemorrhage, unspecified: Secondary | ICD-10-CM | POA: Diagnosis not present

## 2023-12-26 LAB — GLUCOSE, CAPILLARY
Glucose-Capillary: 111 mg/dL — ABNORMAL HIGH (ref 70–99)
Glucose-Capillary: 182 mg/dL — ABNORMAL HIGH (ref 70–99)
Glucose-Capillary: 183 mg/dL — ABNORMAL HIGH (ref 70–99)
Glucose-Capillary: 185 mg/dL — ABNORMAL HIGH (ref 70–99)
Glucose-Capillary: 191 mg/dL — ABNORMAL HIGH (ref 70–99)
Glucose-Capillary: 216 mg/dL — ABNORMAL HIGH (ref 70–99)

## 2023-12-26 LAB — RENAL FUNCTION PANEL
Albumin: 1.8 g/dL — ABNORMAL LOW (ref 3.5–5.0)
Anion gap: 11 (ref 5–15)
BUN: 21 mg/dL (ref 8–23)
CO2: 27 mmol/L (ref 22–32)
Calcium: 8.4 mg/dL — ABNORMAL LOW (ref 8.9–10.3)
Chloride: 109 mmol/L (ref 98–111)
Creatinine, Ser: 1.25 mg/dL — ABNORMAL HIGH (ref 0.61–1.24)
GFR, Estimated: >60 mL/min (ref >60)
Glucose, Bld: 220 mg/dL — ABNORMAL HIGH (ref 70–99)
Phosphorus: 2 mg/dL — ABNORMAL LOW (ref 2.5–4.6)
Potassium: 3.9 mmol/L (ref 3.5–5.1)
Sodium: 147 mmol/L — ABNORMAL HIGH (ref 135–145)

## 2023-12-26 LAB — CBC
HCT: 25.2 % — ABNORMAL LOW (ref 39.0–52.0)
Hemoglobin: 7.7 g/dL — ABNORMAL LOW (ref 13.0–17.0)
MCH: 30.1 pg (ref 26.0–34.0)
MCHC: 30.6 g/dL (ref 30.0–36.0)
MCV: 98.4 fL (ref 80.0–100.0)
Platelets: 164 10*3/uL (ref 150–400)
RBC: 2.56 MIL/uL — ABNORMAL LOW (ref 4.22–5.81)
RDW: 17.3 % — ABNORMAL HIGH (ref 11.5–15.5)
WBC: 11.4 10*3/uL — ABNORMAL HIGH (ref 4.0–10.5)
nRBC: 0 % (ref 0.0–0.2)

## 2023-12-26 MED ORDER — INSULIN ASPART 100 UNIT/ML IJ SOLN
2.0000 [IU] | INTRAMUSCULAR | Status: DC
  Administered 2023-12-26 – 2023-12-31 (×29): 2 [IU] via SUBCUTANEOUS

## 2023-12-26 NOTE — Progress Notes (Signed)
 [facility]                                                                              William Todd, is a 73 y.o. male, DOB - [birth year], JXB:[id number] Admit date - 11/29/2023    Outpatient Primary MD for the patient is Knox Royalty, MD  LOS - 26  days  Chief Complaint  Patient presents with   Fall       Brief summary   Patient is a 73 year old male with HTN, recurrents mechanical fall, protein caloric malnutrition, DMT2, CKD3a, prostate cancer 2014 presented from [facility] while he is rehabbing his recent leg surgery and subsequently had a ground-level fall at [facility].  Workup on admission revealed acute intraparenchymal hemorrhage of the left frontal lobe with multiple bilateral small volume SAH.  Neurosurgery and Neurology consulted and because upon repeat imaging his ICH was worsening he was admitted to CCM for close monitoring.  Underwent multiple repeat head imaging and was then stabilized and Neurosurgery signed off.  Transferred to [facility] service on 12/09/2023.  Hospitalization has been complicated by intermittently Fevers likely from aspiration so was initiated on IV Unasyn ID was consulted. 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 -Repeat CT on 2/17 showed similar appearance of intraparenchymal hematoma in the left frontal lobe with associated mass effect and 5 mm rightward midline shift -LDL 57, hemoglobin A1c 6.1.  2D echo showed EF of 60 to6 5%.  Not able to tolerate MRI. -Seen by neurosurgery, neurology signed off -PEG tube placed on 12/23/2023, tube feeds started on 3/12, tolerating tube feeds -On aspirin 81 mg twice daily PTA, neurology recommending no antithrombotics now due to ICH.   Cerebral Edema:  - Repeat CT 2/17, 5 mm rightward  midline  shift.    Aspiration Pneumonia:   - S/p IV Unasyn x 5 days, COVID, RSV, influenza negative.  -Continue pulmonary toileting, I-S, Xopenex, Atrovent.  No fevers   Essential HTN:  -Continue amlodipine, metoprolol tartrate goal is normotensive.   Diabetes Mellitus Type 2: -Hemoglobin A1c 6.1 -CBG (last 3)   CBG (last 3)  Recent Labs    12/26/23 0409 12/26/23 0811 12/26/23 1204  GLUCAP 185* 216* 183*   -CBGs still elevated, elevated NovoLog 2 units every 4 hours, continue Semglee 70 units daily, moderate SSI   Recurrent mechanical falls:  PT/OT had recommended SNF wife wanted to take the patient home but given that patient's mentation is improving -PT recommending rehab   Hypernatremia -NA stable at 147 -Continue free water 200 cc every 4 hours   AKI on CKD Stage 3a:  -Baseline creatinine 1.4-1.5 -Creatinine improving   Abnormal LFTs:  -LFTs improving   Normocytic Anemia:  --Anemia panel showed iron 32, ferritin 566, folate 11.6, B12 874 -Hemoglobin improving, 7.7, transfuse if less than 7  GERD -Continue PPI    Pressure injury documentation Left hip stage II, POA Left knee posterior, stage III POA Left  foot unstageable, POA Sacrum bilateral stage II Mid coccyx  Severe protein calorie malnutriton Nutrition Problem: Severe Malnutrition Etiology: chronic illness Signs/Symptoms: severe fat depletion, severe muscle depletion, percent weight loss Interventions: Tube feeding, MVI, Refer to RD note for recommendations  Estimated body mass index is 20 kg/m as calculated from the following:   Height as of this encounter: 6' (1.829 m).   Weight as of this encounter: 66.9 kg.  Code Status: DNR DVT Prophylaxis:  heparin injection 5,000 Units Start: 12/06/23 1000 SCDs Start: 11/30/23 0334 Place TED hose Start: 11/30/23 0334   Level of Care: Level of care: Progressive Family Communication: Updated patient Disposition Plan:      Remains inpatient appropriate:  Awaiting SNF   Procedures:    Consultants:   Neurosurgery Neurology Interventional neurology  Antimicrobials:   Anti-infectives (From admission, onward)    Start     Dose/Rate Route Frequency Ordered Stop   12/23/23 1419  ceFAZolin (ANCEF) IVPB 2g/100 mL premix        over 30 Minutes  Continuous PRN 12/23/23 1419 12/23/23 1419   12/22/23 1745  Ampicillin-Sulbactam (UNASYN) 3 g in sodium chloride 0.9 % 100 mL IVPB        3 g 200 mL/hr over 30 Minutes Intravenous Every 6 hours 12/22/23 1651 12/26/23 2359   12/15/23 1200  Ampicillin-Sulbactam (UNASYN) 3 g in sodium chloride 0.9 % 100 mL IVPB        3 g 200 mL/hr over 30 Minutes Intravenous Every 6 hours 12/15/23 1018 12/20/23 0551   12/15/23 0845  ceFEPIme (MAXIPIME) 2 g in sodium chloride 0.9 % 100 mL IVPB  Status:  Discontinued        2 g 200 mL/hr over 30 Minutes Intravenous Every 12 hours 12/15/23 0745 12/15/23 1018   12/14/23 1515  Ampicillin-Sulbactam (UNASYN) 3 g in sodium chloride 0.9 % 100 mL IVPB  Status:  Discontinued        3 g 200 mL/hr over 30 Minutes Intravenous Every 6 hours 12/14/23 1422 12/15/23 0745          Medications  amantadine  100 mg Per Tube BID   amLODipine  5 mg Per Tube Daily   atorvastatin  40 mg Per Tube Daily   feeding supplement (PROSource TF20)  60 mL Per Tube Daily   free water  200 mL Per Tube Q4H   heparin injection (subcutaneous)  5,000 Units Subcutaneous Q12H   insulin aspart  0-15 Units Subcutaneous Q4H   insulin aspart  2 Units Subcutaneous Q4H   insulin glargine  7 Units Subcutaneous Daily   metoCLOPramide  10 mg Per Tube TID AC & HS   metoprolol tartrate  50 mg Per Tube BID   multivitamin with minerals  1 tablet Per Tube Daily   mouth rinse  15 mL Mouth Rinse 4 times per day   pantoprazole (PROTONIX) IV  40 mg Intravenous Q12H   sodium chloride flush  3 mL Intravenous Q12H      Subjective:   William Todd was seen and examined today.  Tolerating tube feeds via PEG  tube, no acute complaints.  Awaiting rehab. Objective:   Vitals:   12/26/23 0406 12/26/23 0407 12/26/23 0812 12/26/23 1204  BP: 127/63  130/70 131/70  Pulse: 89  91 85  Resp: 18  17 18   Temp: 98.2 F (36.8 C)  98.4 F (36.9 C) 99 F (37.2 C)  TempSrc: Axillary  Oral Oral  SpO2: 100%  100% 99%  Weight:  66.9 kg    Height:        Intake/Output Summary (Last 24 hours) at 12/26/2023 1358 Last data filed at 12/26/2023 0815 Gross per 24 hour  Intake 200 ml  Output 1550 ml  Net -1350 ml     Wt Readings from Last 3 Encounters:  12/26/23 66.9 kg  11/16/23 66.7 kg  12/23/13 74.7 kg   Physical Exam General: Oriented to, follows commands intermittently, ill-appearing Cardiovascular: S1 S2 clear, RRR.  Respiratory: CTAB Gastrointestinal: Soft, nontender, nondistended, NBS Ext: no pedal edema bilaterally Neuro: opens eyes to voice Psych: flat affect   Data Reviewed:  I have personally reviewed following labs    CBC Lab Results  Component Value Date   WBC 11.4 (H) 12/26/2023   RBC 2.56 (L) 12/26/2023   HGB 7.7 (L) 12/26/2023   HCT 25.2 (L) 12/26/2023   MCV 98.4 12/26/2023   MCH 30.1 12/26/2023   PLT 164 12/26/2023   MCHC 30.6 12/26/2023   RDW 17.3 (H) 12/26/2023   LYMPHSABS 0.5 (L) 12/24/2023   MONOABS 0.3 12/24/2023   EOSABS 0.0 12/24/2023   BASOSABS 0.0 12/24/2023     Last metabolic panel Lab Results  Component Value Date   NA 147 (H) 12/26/2023   K 3.9 12/26/2023   CL 109 12/26/2023   CO2 27 12/26/2023   BUN 21 12/26/2023   CREATININE 1.25 (H) 12/26/2023   GLUCOSE 220 (H) 12/26/2023   GFRNONAA >60 12/26/2023   GFRAA 87 (L) 01/02/2013   CALCIUM 8.4 (L) 12/26/2023   PHOS 2.0 (L) 12/26/2023   PROT 6.1 (L) 12/24/2023   ALBUMIN 1.8 (L) 12/26/2023   BILITOT 0.7 12/24/2023   ALKPHOS 117 12/24/2023   AST 40 12/24/2023   ALT 66 (H) 12/24/2023   ANIONGAP 11 12/26/2023    CBG (last 3)  Recent Labs    12/26/23 0409 12/26/23 0811 12/26/23 1204  GLUCAP  185* 216* 183*      Coagulation Profile: Recent Labs  Lab 12/21/23 1509  INR 1.3*     Radiology Studies: I have personally reviewed the imaging studies  DG Swallowing Func-Speech Pathology Result Date: 12/25/2023 Table formatting from the original result was not included. Modified Barium Swallow Study Patient Details Name: THOR NANNINI MRN: [id number] Date of Birth: 06-17-1951 Today's Date: 12/25/2023 HPI/PMH: HPI: Hiroyuki Ozanich is a 73 yo male admitted from [facility] 11/29/23 after a fall. PMH: HTN, recurrent falls, malnutrition, DM2, CKD, prostate cancer (2014). Acute left frontal lobe hematoma with multiple bilateral small SAH. CXR concerning for aspiration PNA. PEG placed 3/11 Clinical Impression: Pt scored a DIGEST rating of 2, moderate pharyngeal dysphagia. Oral dysphagia was also present.  He demonstrated fluctuating swallowing, hampered by his cognitive impairments, including poor initiation and difficulty with sustaining his attention during the study. There was frequent oral holding and reduced oral movement/propulsion into the pharynx.  There was inconsistent base-of-tongue and pharyngeal wall contraction, leading to variations in degree of residue.  There was diminished laryngeal mobility, inconsistent epiglottic closure, and intermittent penetration of materials into the laryngeal vestibule.  Mixed consistencies collected in the pyriforms and on two occasions spilled into the airway with no response from the patient.  Pt could not follow commands for compensatory strategies or a cued cough.  There was no single consistency that can be deemed safer than the others. However, with opportunities to swallow, pt may show some gains.  For now, continue ice chips and allow teaspoons of water with supervision.  SLP will follow  while in acute care; he will benefit from swallowing tx at next level of care.  DIGEST Swallow Severity Rating*             Safety: 2             Efficiency:2              Overall Pharyngeal Swallow Severity: 2 moderate 1: mild; 2: moderate; 3: severe; 4: profound *The Dynamic Imaging Grade of Swallowing Toxicity is standardized for the head and neck cancer population, however, demonstrates promising clinical applications across populations to standardize the clinical rating of pharyngeal swallow safety and severity.    Factors that may increase risk of adverse event in presence of aspiration [name] & [name] 2021): Factors that may increase risk of adverse event in presence of aspiration [name] & [name] 2021): Reduced cognitive function; Limited mobility; Dependence for feeding and/or oral hygiene; Weak cough; Aspiration of thick, dense, and/or acidic materials Recommendations/Plan: Swallowing Evaluation Recommendations Swallowing Evaluation Recommendations Recommendations: NPO; Ice chips PRN after oral care (teaspoons of water after oral care) Liquid Administration via: Spoon Medication Administration: Via alternative means Supervision: Full assist for feeding Swallowing strategies  : Small bites/sips Oral care recommendations: Oral care QID (4x/day) Caregiver Recommendations: Have oral suction available Treatment Plan Treatment Plan Treatment recommendations: Therapy as outlined in treatment plan below Functional status assessment: Patient has had a recent decline in their functional status and demonstrates the ability to make significant improvements in function in a reasonable and predictable amount of time. Treatment frequency: Min 2x/week Treatment duration: 2 weeks Interventions: Patient/family education; Diet toleration management by SLP Recommendations Recommendations for follow up therapy are one component of a multi-disciplinary discharge planning process, led by the attending physician.  Recommendations may be updated based on patient status, additional functional criteria and insurance authorization. Assessment: Orofacial Exam: Orofacial Exam Oral Cavity: Oral  Hygiene: WFL Oral Cavity - Dentition: Missing dentition Anatomy: Anatomy: Suspected cervical osteophytes Boluses Administered: Boluses Administered Boluses Administered: Thin liquids (Level 0); Mildly thick liquids (Level 2, nectar thick); Moderately thick liquids (Level 3, honey thick); Puree  Oral Impairment Domain: Oral Impairment Domain Lip Closure: Interlabial escape, no progression to anterior lip Tongue control during bolus hold: Not tested Bolus preparation/mastication: Slow prolonged chewing/mashing with complete recollection Bolus transport/lingual motion: Repetitive/disorganized tongue motion Oral residue: Residue collection on oral structures Location of oral residue : Tongue; Floor of mouth Initiation of pharyngeal swallow : Pyriform sinuses  Pharyngeal Impairment Domain: Pharyngeal Impairment Domain Soft palate elevation: No bolus between soft palate (SP)/pharyngeal wall (PW) Laryngeal elevation: Partial superior movement of thyroid cartilage/partial approximation of arytenoids to epiglottic petiole Anterior hyoid excursion: Partial anterior movement Epiglottic movement: Partial inversion Laryngeal vestibule closure: Incomplete, narrow column air/contrast in laryngeal vestibule Pharyngeal stripping wave : Present - diminished Pharyngoesophageal segment opening: Partial distention/partial duration, partial obstruction of flow Tongue base retraction: Narrow column of contrast or air between tongue base and PPW Pharyngeal residue: Collection of residue within or on pharyngeal structures Location of pharyngeal residue: Valleculae; Pharyngeal wall; Pyriform sinuses  Esophageal Impairment Domain: Esophageal Impairment Domain Esophageal clearance upright position: Esophageal retention (minimal) Pill: Pill Consistency administered: -- (NT) Penetration/Aspiration Scale Score: Penetration/Aspiration Scale Score 8.  Material enters airway, passes BELOW cords without attempt by patient to eject out (silent  aspiration) : Thin liquids (Level 0); Mildly thick liquids (Level 2, nectar thick); Moderately thick liquids (Level 3, honey thick); Puree Compensatory Strategies: Compensatory Strategies Compensatory strategies: Yes Straw: Ineffective   General Information: Caregiver present:  No  Diet Prior to this Study: NPO   No data recorded  No data recorded  No data recorded  No data recorded Behavior/Cognition: Alert; Doesn't follow directions Self-Feeding Abilities: Dependent for feeding Baseline vocal quality/speech: Normal Volitional Cough: Unable to elicit Volitional Swallow: Unable to elicit Exam Limitations: Poor positioning (head extension and turning during the study) Goal Planning: Prognosis for improved oropharyngeal function: Fair No data recorded No data recorded Patient/Family Stated Goal: unable to state No data recorded Pain: Pain Assessment Pain Assessment: Faces Faces Pain Scale: 0 Breathing: 0 Negative Vocalization: 0 Facial Expression: 0 Body Language: 0 Consolability: 0 PAINAD Score: 0 End of Session: Start Time:SLP Start Time (ACUTE ONLY): 1032 Stop Time: SLP Stop Time (ACUTE ONLY): 1101 Time Calculation:SLP Time Calculation (min) (ACUTE ONLY): 29 min Charges: SLP Evaluations $ SLP Speech Visit: 1 Visit SLP Evaluations $Swallowing Treatment: 1 Procedure SLP visit diagnosis: SLP Visit Diagnosis: Dysphagia, oropharyngeal phase (R13.12) Past Medical History: Past Medical History: Diagnosis Date  Anxiety   new dx  Chronic kidney disease   Diabetes mellitus   type ii  History of radiation therapy 09/29/13- 11/25/13  prostate 7800 cGy 40 sessions, seminal vesicles 5600 cGy 40 sessions  Hypertension   Prostate cancer (HCC) 06/01/2013  gleason 7 Past Surgical History: Past Surgical History: Procedure Laterality Date  CARDIAC CATHETERIZATION  01/01/2013  FRACTURE SURGERY    left foot   HIP ARTHROPLASTY Left 11/16/2023  Procedure: ARTHROPLASTY BIPOLAR HIP (HEMIARTHROPLASTY);  Surgeon: Sheral Apley, MD;  Location:  [facility] OR;  Service: Orthopedics;  Laterality: Left;  IR GASTROSTOMY TUBE MOD SED  12/23/2023  LEFT HEART CATHETERIZATION WITH CORONARY ANGIOGRAM N/A 01/01/2013  Procedure: LEFT HEART CATHETERIZATION WITH CORONARY ANGIOGRAM;  Surgeon: Robynn Pane, MD;  Location: [facility] CATH LAB;  Service: Cardiovascular;  Laterality: N/A;  PROSTATE BIOPSY  06/01/13 [name] 12/25/2023, 3:34 PM Amanda L. [name], MA CCC/SLP Clinical Specialist - Acute Care SLP Acute Rehabilitation Services Office number [phone]      Thad Ranger M.D. [facility] 12/26/2023, 1:58 PM  Available via Epic secure chat 7am-7pm After 7 pm, please refer to night coverage provider listed on amion.

## 2023-12-26 NOTE — Plan of Care (Signed)
  Problem: Metabolic: Goal: Ability to maintain appropriate glucose levels will improve Outcome: Progressing   Problem: Nutritional: Goal: Maintenance of adequate nutrition will improve Outcome: Progressing   Problem: Skin Integrity: Goal: Risk for impaired skin integrity will decrease Outcome: Progressing   Problem: Tissue Perfusion: Goal: Adequacy of tissue perfusion will improve Outcome: Progressing   Problem: Activity: Goal: Risk for activity intolerance will decrease Outcome: Progressing

## 2023-12-26 NOTE — Plan of Care (Signed)
  Problem: Metabolic: Goal: Ability to maintain appropriate glucose levels will improve 12/26/2023 0345 by Avie Arenas, RN Outcome: Progressing 12/26/2023 0305 by Avie Arenas, RN Outcome: Progressing   Problem: Nutritional: Goal: Maintenance of adequate nutrition will improve 12/26/2023 0345 by Avie Arenas, RN Outcome: Progressing 12/26/2023 0305 by Avie Arenas, RN Outcome: Progressing   Problem: Skin Integrity: Goal: Risk for impaired skin integrity will decrease 12/26/2023 0345 by Avie Arenas, RN Outcome: Progressing 12/26/2023 0305 by Avie Arenas, RN Outcome: Progressing   Problem: Tissue Perfusion: Goal: Adequacy of tissue perfusion will improve Outcome: Progressing   Problem: Activity: Goal: Risk for activity intolerance will decrease Outcome: Progressing

## 2023-12-26 NOTE — TOC Progression Note (Signed)
 Transition of Care [facility]) - Progression Note    Patient Details  Name: William Todd MRN: [id number] Date of Birth: [birth year]  Transition of Care [facility] CM/SW Contact  Baldemar Lenis, [place] Phone Number: 12/26/2023, 4:26 PM  Clinical Narrative:   CSW met with patient's spouse, William Todd, to provide bed offers. William Todd said she would look the options over to decide the best choice. CSW to follow.    Expected Discharge Plan: Skilled Nursing Facility Barriers to Discharge: Continued Medical Work up, English as a second language teacher  Expected Discharge Plan and Services In-house Referral: Clinical Social Work, Hospice / Palliative Care     Living arrangements for the past 2 months: Single Family Home, Skilled Nursing Facility                                       Social Determinants of Health (SDOH) Interventions SDOH Screenings   Food Insecurity: No Food Insecurity (11/30/2023)  Housing: Low Risk  (11/30/2023)  Transportation Needs: No Transportation Needs (11/30/2023)  Utilities: Not At Risk (11/30/2023)  Social Connections: Moderately Integrated (11/30/2023)  Tobacco Use: Low Risk  (11/30/2023)    Readmission Risk Interventions    12/08/2023   12:40 PM  Readmission Risk Prevention Plan  Transportation Screening Complete  Medication Review (RN Care Manager) Complete  PCP or Specialist appointment within 3-5 days of discharge Complete  HRI or Home Care Consult Complete  SW Recovery Care/Counseling Consult Complete  Palliative Care Screening Complete  Skilled Nursing Facility Complete

## 2023-12-26 NOTE — Progress Notes (Signed)
 Palliative-   Chart reviewed.  Palliative has made multiple attempts to speak with patient's spouse. She previously expressed no interest in speaking with Palliative team and desires continued full scope care including PEG tube with limit set at DNR.   Palliative will follow peripherally- please call if acute Palliative needs arise and patient's spouse is interested in discussion with Palliative providers.   Ocie Bob, AGNP-C Palliative Medicine  No charge

## 2023-12-26 NOTE — TOC Progression Note (Signed)
 Transition of Care [facility]) - Progression Note    Patient Details  Name: William Todd MRN: [id number] Date of Birth: 06-Dec-1950  Transition of Care [facility]) CM/SW Contact  Baldemar Lenis, [place] Phone Number: 12/26/2023, 4:27 PM  Clinical Narrative:   CSW met with patient's spouse, Chyrl Civatte, to discuss SNF options. Joann asked about [facility], referral still pending. CSW to check with [facility]. Spouse expressed she wasn't sure about the other options, was concerned about reviews. Lengthy discussion with spouse about how to research options available and to go by a tour. Spouse said she could go over the weekend, but CSW reminded spouse that the patient is ready for discharge once a facility is chosen, and spouse said she would go by [facility] on her way home to check it out. CSW asked [facility] to review referral, but they are unable to offer a bed. CSW to continue to follow.    Expected Discharge Plan: Skilled Nursing Facility Barriers to Discharge: Continued Medical Work up, English as a second language teacher  Expected Discharge Plan and Services In-house Referral: Clinical Social Work, Hospice / Palliative Care     Living arrangements for the past 2 months: Single Family Home, Skilled Nursing Facility                                       Social Determinants of Health (SDOH) Interventions SDOH Screenings   Food Insecurity: No Food Insecurity (11/30/2023)  Housing: Low Risk  (11/30/2023)  Transportation Needs: No Transportation Needs (11/30/2023)  Utilities: Not At Risk (11/30/2023)  Social Connections: Moderately Integrated (11/30/2023)  Tobacco Use: Low Risk  (11/30/2023)    Readmission Risk Interventions    12/08/2023   12:40 PM  Readmission Risk Prevention Plan  Transportation Screening Complete  Medication Review (RN Care Manager) Complete  PCP or Specialist appointment within 3-5 days of discharge Complete  HRI or Home Care Consult Complete  SW Recovery  Care/Counseling Consult Complete  Palliative Care Screening Complete  Skilled Nursing Facility Complete

## 2023-12-26 NOTE — Progress Notes (Signed)
 Physical Therapy Treatment Patient Details Name: William Todd MRN: [id number] DOB: Feb 21, 1951 Today's Date: 12/26/2023   History of Present Illness William Todd is a 73 y.o. male who presented on 2/15 after ground-level fall at SNF. 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 Cortrak replaced on 12/22/2023 and underwent PEG tube placement on 12/23/2023. PMHx includes HTN, recurrent mechanical falls, protein calorie malnutrition, DM2, CKD, and prostate cancer 2014, L foot fx with AFO,  L THA 11/16/23, anxiety, dementia    PT Comments  Pt was able to increase time sitting EOB and was able to sit at Min A after initial Max A for balance sitting EOB. Pt was responsive to sisters voice. Pt was total A for bed mobility and attempted sit to stand. Due to pt current functional status, home set up and available assistance at home recommending skilled physical therapy services < 3 hours/day in order to address strength, balance and functional mobility to decrease risk for falls, injury, immobility, skin break down and re-hospitalization.      If plan is discharge home, recommend the following: Assistance with cooking/housework;Assist for transportation;Help with stairs or ramp for entrance;Supervision due to cognitive status;A lot of help with walking and/or transfers   Can travel by private vehicle     No  Equipment Recommendations  None recommended by PT       Precautions / Restrictions Precautions Precautions: Fall;Other (comment) (seizure) Precaution Booklet Issued: No Recall of Precautions/Restrictions: Impaired Required Braces or Orthoses: Other Brace Other Brace: L AFO Restrictions Weight Bearing Restrictions Per Provider Order: No     Mobility  Bed Mobility Overal bed mobility: Needs Assistance Bed Mobility: Supine to Sit, Sit to Supine     Supine to sit: Total assist Sit to supine: Total assist   General bed mobility comments: Pt with  good initiation moving LLE OOB but inconsistently followed 1-step verbal commands and overall required up to TOTAL A to complete bed mobility tasks    Transfers Overall transfer level: Needs assistance Equipment used: 1 person hand held assist Transfers: Sit to/from Stand Sit to Stand: Total assist           General transfer comment: Attempted to stand EOB. Pt unable to fully stand upright. Pt with increased knee and trunk flexion attempted 2x.       Balance Overall balance assessment: Needs assistance Sitting-balance support: Bilateral upper extremity supported, Feet supported Sitting balance-Leahy Scale: Poor Sitting balance - Comments: static sitting EOB. Requires Max A initially then min A consistently for sitting EOB due to heavy posterior lean. Postural control: Posterior lean Standing balance support: Bilateral upper extremity supported Standing balance-Leahy Scale: Zero        Communication Communication Communication: Impaired Factors Affecting Communication: Difficulty expressing self  Cognition Arousal: Alert Behavior During Therapy: Flat affect   PT - Cognitive impairments: Problem solving, Safety/Judgement, Attention     PT - Cognition Comments: Pt answers simple questions intermittently Following commands: Impaired Following commands impaired: Follows one step commands inconsistently    Cueing Cueing Techniques: Verbal cues, Gestural cues, Tactile cues, Visual cues     General Comments General comments (skin integrity, edema, etc.): Sister at bedside. Pt reports some lightheadedness when prompted. Unable to get BP. Pt was assisted back to supine. Pt able to sit for ~10 min today      Pertinent Vitals/Pain Pain Assessment Pain Assessment: Faces Faces Pain Scale: Hurts a little bit Breathing: normal Negative Vocalization: none Facial  Expression: facial grimacing Body Language: relaxed Consolability: no need to console PAINAD Score: 2 Pain  Location: generalized, R side seems worse than L Pain Descriptors / Indicators: Discomfort Pain Intervention(s): Monitored during session, Limited activity within patient's tolerance     PT Goals (current goals can now be found in the care plan section) Acute Rehab PT Goals Patient Stated Goal: to go to rehab to get stronger. PT Goal Formulation: With patient/family Time For Goal Achievement: 01/07/24 Potential to Achieve Goals: Fair Progress towards PT goals: Progressing toward goals    Frequency    Min 1X/week      PT Plan  Continue with current POC        AM-PAC PT "6 Clicks" Mobility   Outcome Measure  Help needed turning from your back to your side while in a flat bed without using bedrails?: Total Help needed moving from lying on your back to sitting on the side of a flat bed without using bedrails?: Total Help needed moving to and from a bed to a chair (including a wheelchair)?: Total Help needed standing up from a chair using your arms (e.g., wheelchair or bedside chair)?: Total Help needed to walk in hospital room?: Total Help needed climbing 3-5 steps with a railing? : Total 6 Click Score: 6    End of Session Equipment Utilized During Treatment: Gait belt Activity Tolerance: Patient tolerated treatment well Patient left: with call bell/phone within reach;in bed;with bed alarm set;with restraints reapplied;with family/visitor present Nurse Communication: Mobility status PT Visit Diagnosis: Unsteadiness on feet (R26.81);History of falling (Z91.81);Difficulty in walking, not elsewhere classified (R26.2)     Time: 9811-9147 PT Time Calculation (min) (ACUTE ONLY): 27 min  Charges:    $Therapeutic Activity: 23-37 mins PT General Charges $$ ACUTE PT VISIT: 1 Visit                     Harrel Carina, DPT, CLT  Acute Rehabilitation Services Office: [phone] (Secure chat preferred)    Claudia Desanctis 12/26/2023, 5:17 PM

## 2023-12-27 DIAGNOSIS — W19XXXA Unspecified fall, initial encounter: Secondary | ICD-10-CM | POA: Diagnosis not present

## 2023-12-27 DIAGNOSIS — I619 Nontraumatic intracerebral hemorrhage, unspecified: Secondary | ICD-10-CM | POA: Diagnosis not present

## 2023-12-27 DIAGNOSIS — I611 Nontraumatic intracerebral hemorrhage in hemisphere, cortical: Secondary | ICD-10-CM | POA: Diagnosis not present

## 2023-12-27 DIAGNOSIS — I609 Nontraumatic subarachnoid hemorrhage, unspecified: Secondary | ICD-10-CM | POA: Diagnosis not present

## 2023-12-27 LAB — CBC
HCT: 24.4 % — ABNORMAL LOW (ref 39.0–52.0)
Hemoglobin: 7.3 g/dL — ABNORMAL LOW (ref 13.0–17.0)
MCH: 29.9 pg (ref 26.0–34.0)
MCHC: 29.9 g/dL — ABNORMAL LOW (ref 30.0–36.0)
MCV: 100 fL (ref 80.0–100.0)
Platelets: 151 10*3/uL (ref 150–400)
RBC: 2.44 MIL/uL — ABNORMAL LOW (ref 4.22–5.81)
RDW: 17.2 % — ABNORMAL HIGH (ref 11.5–15.5)
WBC: 10.8 10*3/uL — ABNORMAL HIGH (ref 4.0–10.5)
nRBC: 0 % (ref 0.0–0.2)

## 2023-12-27 LAB — RENAL FUNCTION PANEL
Albumin: 1.8 g/dL — ABNORMAL LOW (ref 3.5–5.0)
Anion gap: 13 (ref 5–15)
BUN: 24 mg/dL — ABNORMAL HIGH (ref 8–23)
CO2: 22 mmol/L (ref 22–32)
Calcium: 8.2 mg/dL — ABNORMAL LOW (ref 8.9–10.3)
Chloride: 107 mmol/L (ref 98–111)
Creatinine, Ser: 1.24 mg/dL (ref 0.61–1.24)
GFR, Estimated: >60 mL/min (ref >60)
Glucose, Bld: 146 mg/dL — ABNORMAL HIGH (ref 70–99)
Phosphorus: 2.7 mg/dL (ref 2.5–4.6)
Potassium: 4.2 mmol/L (ref 3.5–5.1)
Sodium: 142 mmol/L (ref 135–145)

## 2023-12-27 LAB — GLUCOSE, CAPILLARY
Glucose-Capillary: 151 mg/dL — ABNORMAL HIGH (ref 70–99)
Glucose-Capillary: 200 mg/dL — ABNORMAL HIGH (ref 70–99)
Glucose-Capillary: 224 mg/dL — ABNORMAL HIGH (ref 70–99)
Glucose-Capillary: 135 mg/dL — ABNORMAL HIGH (ref 70–99)
Glucose-Capillary: 162 mg/dL — ABNORMAL HIGH (ref 70–99)

## 2023-12-27 MED ORDER — INSULIN GLARGINE 100 UNIT/ML ~~LOC~~ SOLN
10.0000 [IU] | Freq: Every day | SUBCUTANEOUS | Status: DC
  Administered 2023-12-28 – 2023-12-30 (×3): 10 [IU] via SUBCUTANEOUS
  Filled 2023-12-27 (×4): qty 0.1

## 2023-12-27 MED ORDER — INSULIN GLARGINE 100 UNIT/ML ~~LOC~~ SOLN
3.0000 [IU] | Freq: Once | SUBCUTANEOUS | Status: AC
  Administered 2023-12-27: 3 [IU] via SUBCUTANEOUS
  Filled 2023-12-27: qty 0.03

## 2023-12-27 NOTE — TOC Progression Note (Addendum)
 Transition of Care [facility]) - Progression Note    Patient Details  Name: William Todd MRN: [id number] Date of Birth: 1951-02-22  [facility] CM/SW Contact  Dellie Burns [place], [place] Phone Number: 12/27/2023, 4:50 PM  Clinical Narrative: Voicemail left for pt's wife requesting return call re SNF choice. Will provide updates as available.   UPDATE 1655: Return call received from pt's wife who reports she toured [facility] and did not have a good first impression, refuses this option. Discussed alternative option available at [facility] and wife plans to tour. SW will follow.  Dellie Burns, MSW, LCSW [phone] (coverage)        Expected Discharge Plan: Skilled Nursing Facility Barriers to Discharge: Continued Medical Work up, English as a second language teacher  Expected Discharge Plan and Services In-house Referral: Clinical Social Work, Hospice / Palliative Care     Living arrangements for the past 2 months: Single Family Home, Skilled Nursing Facility                                       Social Determinants of Health (SDOH) Interventions SDOH Screenings   Food Insecurity: No Food Insecurity (11/30/2023)  Housing: Low Risk  (11/30/2023)  Transportation Needs: No Transportation Needs (11/30/2023)  Utilities: Not At Risk (11/30/2023)  Social Connections: Moderately Integrated (11/30/2023)  Tobacco Use: Low Risk  (11/30/2023)    Readmission Risk Interventions    12/08/2023   12:40 PM  Readmission Risk Prevention Plan  Transportation Screening Complete  Medication Review (RN Care Manager) Complete  PCP or Specialist appointment within 3-5 days of discharge Complete  HRI or Home Care Consult Complete  SW Recovery Care/Counseling Consult Complete  Palliative Care Screening Complete  Skilled Nursing Facility Complete

## 2023-12-27 NOTE — Plan of Care (Signed)
  Problem: Coping: Goal: Ability to adjust to condition or change in health will improve Outcome: Progressing   Problem: Health Behavior/Discharge Planning: Goal: Ability to manage health-related needs will improve Outcome: Progressing   Problem: Skin Integrity: Goal: Risk for impaired skin integrity will decrease Outcome: Progressing   Problem: Clinical Measurements: Goal: Respiratory complications will improve Outcome: Progressing   Problem: Activity: Goal: Risk for activity intolerance will decrease Outcome: Progressing   Problem: Safety: Goal: Ability to remain free from injury will improve Outcome: Progressing   Problem: Skin Integrity: Goal: Risk for impaired skin integrity will decrease Outcome: Progressing

## 2023-12-27 NOTE — Progress Notes (Signed)
 [facility]                                                                              William Todd, is a 73 y.o. male, DOB - 1951-04-30, JXB:[id number] Admit date - 11/29/2023    Outpatient Primary MD for the patient is William Royalty, MD  LOS - 27  days  Chief Complaint  Patient presents with   Fall       Brief summary   Patient is a 73 year old male with HTN, recurrents mechanical fall, protein caloric malnutrition, DMT2, CKD3a, prostate cancer 2014 presented from [facility] [place] while he is rehabbing his recent leg surgery and subsequently had a ground-level fall at [facility].  Workup on admission revealed acute intraparenchymal hemorrhage of the left frontal lobe with multiple bilateral small volume SAH.  Neurosurgery and Neurology consulted and because upon repeat imaging his ICH was worsening he was admitted to [facility] for close monitoring.  Underwent multiple repeat head imaging and was then stabilized and Neurosurgery signed off.  Transferred to [facility] service on 12/09/2023.  Hospitalization has been complicated by intermittently Fevers likely from aspiration so was initiated on IV Unasyn ID was consulted. 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 -Repeat CT on 2/17 showed similar appearance of intraparenchymal hematoma in the left frontal lobe with associated mass effect and 5 mm rightward midline shift -LDL 57, hemoglobin A1c 6.1.  2D echo showed EF of 60 to6 5%.  Not able to tolerate MRI. -Seen by neurosurgery, neurology signed off -PEG tube placed on 12/23/2023, tube feeds started on 3/12, tolerating tube feeds -On aspirin 81 mg twice daily PTA, neurology recommending no antithrombotics now due to ICH.   Cerebral Edema:  - Repeat CT 2/17, 5 mm rightward  midline  shift.    Aspiration Pneumonia:   - S/p IV Unasyn x 5 days, COVID, RSV, influenza negative.  -Continue pulmonary toileting, I-S, Xopenex, Atrovent.  No fevers   Essential HTN:  -BP stable, continue amlodipine, metoprolol    Diabetes Mellitus Type 2: -Hemoglobin A1c 6.1 -CBG (last 3)   CBG (last 3)  Recent Labs    12/27/23 0317 12/27/23 0824 12/27/23 1200  GLUCAP 151* 200* 224*  CBGs still elevated, increased Semglee to 10 units daily, continue NovoLog 2 units q4 hours, moderate SSI  Recurrent mechanical falls:  PT/OT had recommended SNF wife wanted to take the patient home -PT recommending rehab   Hypernatremia -Sodium improving -Continue free water 200 cc every 4 hours   AKI on CKD Stage 3a:  -Baseline creatinine 1.4-1.5 -Creatinine improving   Abnormal LFTs:  -LFTs improving   Normocytic Anemia:  --Anemia panel showed iron 32, ferritin 566, folate 11.6, B12 874 -Hemoglobin improving, 7.7, transfuse if less than 7  GERD -Continue PPI    Pressure injury documentation Left hip stage II, POA Left knee posterior, stage III POA Left foot unstageable, POA Sacrum bilateral stage II Mid coccyx  Severe protein  calorie malnutriton Nutrition Problem: Severe Malnutrition Etiology: chronic illness Signs/Symptoms: severe fat depletion, severe muscle depletion, percent weight loss Interventions: Tube feeding, MVI, Refer to RD note for recommendations  Estimated body mass index is 19.55 kg/m as calculated from the following:   Height as of this encounter: 6' (1.829 m).   Weight as of this encounter: 65.4 kg.  Code Status: DNR DVT Prophylaxis:  heparin injection 5,000 Units Start: 12/06/23 1000 SCDs Start: 11/30/23 0334 Place TED hose Start: 11/30/23 0334   Level of Care: Level of care: Progressive Family Communication: Updated patient Disposition Plan:      Remains inpatient appropriate: Awaiting SNF   Procedures:    Consultants:    Neurosurgery Neurology Interventional neurology  Antimicrobials:   Anti-infectives (From admission, onward)    Start     Dose/Rate Route Frequency Ordered Stop   12/23/23 1419  ceFAZolin (ANCEF) IVPB 2g/100 mL premix        over 30 Minutes  Continuous PRN 12/23/23 1419 12/23/23 1419   12/22/23 1745  Ampicillin-Sulbactam (UNASYN) 3 g in sodium chloride 0.9 % 100 mL IVPB        3 g 200 mL/hr over 30 Minutes Intravenous Every 6 hours 12/22/23 1651 12/26/23 1743   12/15/23 1200  Ampicillin-Sulbactam (UNASYN) 3 g in sodium chloride 0.9 % 100 mL IVPB        3 g 200 mL/hr over 30 Minutes Intravenous Every 6 hours 12/15/23 1018 12/20/23 0551   12/15/23 0845  ceFEPIme (MAXIPIME) 2 g in sodium chloride 0.9 % 100 mL IVPB  Status:  Discontinued        2 g 200 mL/hr over 30 Minutes Intravenous Every 12 hours 12/15/23 0745 12/15/23 1018   12/14/23 1515  Ampicillin-Sulbactam (UNASYN) 3 g in sodium chloride 0.9 % 100 mL IVPB  Status:  Discontinued        3 g 200 mL/hr over 30 Minutes Intravenous Every 6 hours 12/14/23 1422 12/15/23 0745          Medications  amantadine  100 mg Per Tube BID   amLODipine  5 mg Per Tube Daily   atorvastatin  40 mg Per Tube Daily   feeding supplement (PROSource TF20)  60 mL Per Tube Daily   free water  200 mL Per Tube Q4H   heparin injection (subcutaneous)  5,000 Units Subcutaneous Q12H   insulin aspart  0-15 Units Subcutaneous Q4H   insulin aspart  2 Units Subcutaneous Q4H   insulin glargine  7 Units Subcutaneous Daily   metoCLOPramide  10 mg Per Tube TID AC & HS   metoprolol tartrate  50 mg Per Tube BID   multivitamin with minerals  1 tablet Per Tube Daily   mouth rinse  15 mL Mouth Rinse 4 times per day   pantoprazole (PROTONIX) IV  40 mg Intravenous Q12H   sodium chloride flush  3 mL Intravenous Q12H      Subjective:   [name] was seen and examined today.  Tolerating tube feeds.  No acute issues.  Awaiting SNF.    Objective:   Vitals:    12/27/23 0309 12/27/23 0500 12/27/23 0823 12/27/23 1200  BP: 122/79  128/67 121/67  Pulse: 84  93 85  Resp: 16  18 18   Temp: 98.9 F (37.2 C)  98.3 F (36.8 C) 99.1 F (37.3 C)  TempSrc: Oral  Oral Oral  SpO2: 99%  99% 99%  Weight:  65.4 kg    Height:  Intake/Output Summary (Last 24 hours) at 12/27/2023 1313 Last data filed at 12/27/2023 1000 Gross per 24 hour  Intake 1185 ml  Output 1900 ml  Net -715 ml     Wt Readings from Last 3 Encounters:  12/27/23 65.4 kg  11/16/23 66.7 kg  12/23/13 74.7 kg    Physical Exam General: Alert, flat affect, keeping eyes closed. Cardiovascular: S1 S2 clear, RRR.  Respiratory: CTAB, no wheezing Gastrointestinal: Soft, nontender, nondistended, NBS Ext: no pedal edema bilaterally Neuro: does not follow commands Psych: flat affect  Data Reviewed:  I have personally reviewed following labs    CBC Lab Results  Component Value Date   WBC 10.8 (H) 12/27/2023   RBC 2.44 (L) 12/27/2023   HGB 7.3 (L) 12/27/2023   HCT 24.4 (L) 12/27/2023   MCV 100.0 12/27/2023   MCH 29.9 12/27/2023   PLT 151 12/27/2023   MCHC 29.9 (L) 12/27/2023   RDW 17.2 (H) 12/27/2023   LYMPHSABS 0.5 (L) 12/24/2023   MONOABS 0.3 12/24/2023   EOSABS 0.0 12/24/2023   BASOSABS 0.0 12/24/2023     Last metabolic panel Lab Results  Component Value Date   NA 142 12/27/2023   K 4.2 12/27/2023   CL 107 12/27/2023   CO2 22 12/27/2023   BUN 24 (H) 12/27/2023   CREATININE 1.24 12/27/2023   GLUCOSE 146 (H) 12/27/2023   GFRNONAA >60 12/27/2023   GFRAA 87 (L) 01/02/2013   CALCIUM 8.2 (L) 12/27/2023   PHOS 2.7 12/27/2023   PROT 6.1 (L) 12/24/2023   ALBUMIN 1.8 (L) 12/27/2023   BILITOT 0.7 12/24/2023   ALKPHOS 117 12/24/2023   AST 40 12/24/2023   ALT 66 (H) 12/24/2023   ANIONGAP 13 12/27/2023    CBG (last 3)  Recent Labs    12/27/23 0317 12/27/23 0824 12/27/23 1200  GLUCAP 151* 200* 224*      Coagulation Profile: Recent Labs  Lab  12/21/23 1509  INR 1.3*     Radiology Studies: I have personally reviewed the imaging studies  No results found.      Thad Ranger M.D. [facility] 12/27/2023, 1:13 PM  Available via Epic secure chat 7am-7pm After 7 pm, please refer to night coverage provider listed on amion.

## 2023-12-28 DIAGNOSIS — I619 Nontraumatic intracerebral hemorrhage, unspecified: Secondary | ICD-10-CM | POA: Diagnosis not present

## 2023-12-28 DIAGNOSIS — I611 Nontraumatic intracerebral hemorrhage in hemisphere, cortical: Secondary | ICD-10-CM | POA: Diagnosis not present

## 2023-12-28 DIAGNOSIS — I609 Nontraumatic subarachnoid hemorrhage, unspecified: Secondary | ICD-10-CM | POA: Diagnosis not present

## 2023-12-28 DIAGNOSIS — W19XXXA Unspecified fall, initial encounter: Secondary | ICD-10-CM | POA: Diagnosis not present

## 2023-12-28 LAB — RENAL FUNCTION PANEL
Albumin: 2 g/dL — ABNORMAL LOW (ref 3.5–5.0)
Anion gap: 7 (ref 5–15)
BUN: 26 mg/dL — ABNORMAL HIGH (ref 8–23)
CO2: 26 mmol/L (ref 22–32)
Calcium: 8.3 mg/dL — ABNORMAL LOW (ref 8.9–10.3)
Chloride: 107 mmol/L (ref 98–111)
Creatinine, Ser: 1.41 mg/dL — ABNORMAL HIGH (ref 0.61–1.24)
GFR, Estimated: 53 mL/min — ABNORMAL LOW (ref >60)
Glucose, Bld: 146 mg/dL — ABNORMAL HIGH (ref 70–99)
Phosphorus: 2.9 mg/dL (ref 2.5–4.6)
Potassium: 4.5 mmol/L (ref 3.5–5.1)
Sodium: 140 mmol/L (ref 135–145)

## 2023-12-28 LAB — GLUCOSE, CAPILLARY
Glucose-Capillary: 128 mg/dL — ABNORMAL HIGH (ref 70–99)
Glucose-Capillary: 139 mg/dL — ABNORMAL HIGH (ref 70–99)
Glucose-Capillary: 151 mg/dL — ABNORMAL HIGH (ref 70–99)
Glucose-Capillary: 152 mg/dL — ABNORMAL HIGH (ref 70–99)
Glucose-Capillary: 152 mg/dL — ABNORMAL HIGH (ref 70–99)
Glucose-Capillary: 173 mg/dL — ABNORMAL HIGH (ref 70–99)
Glucose-Capillary: 189 mg/dL — ABNORMAL HIGH (ref 70–99)
Glucose-Capillary: 197 mg/dL — ABNORMAL HIGH (ref 70–99)

## 2023-12-28 LAB — CBC
HCT: 24.9 % — ABNORMAL LOW (ref 39.0–52.0)
Hemoglobin: 7.6 g/dL — ABNORMAL LOW (ref 13.0–17.0)
MCH: 29.6 pg (ref 26.0–34.0)
MCHC: 30.5 g/dL (ref 30.0–36.0)
MCV: 96.9 fL (ref 80.0–100.0)
Platelets: 150 10*3/uL (ref 150–400)
RBC: 2.57 MIL/uL — ABNORMAL LOW (ref 4.22–5.81)
RDW: 17 % — ABNORMAL HIGH (ref 11.5–15.5)
WBC: 11.1 10*3/uL — ABNORMAL HIGH (ref 4.0–10.5)
nRBC: 0 % (ref 0.0–0.2)

## 2023-12-28 NOTE — Plan of Care (Signed)
 No significant changes happen as of this time.   Problem: Health Behavior/Discharge Planning: Goal: Ability to manage health-related needs will improve Outcome: Progressing   Problem: Skin Integrity: Goal: Risk for impaired skin integrity will decrease Outcome: Progressing   Problem: Clinical Measurements: Goal: Will remain free from infection Outcome: Progressing   Problem: Nutrition: Goal: Adequate nutrition will be maintained Outcome: Progressing   Problem: Safety: Goal: Ability to remain free from injury will improve Outcome: Progressing   Problem: Intracerebral Hemorrhage Tissue Perfusion: Goal: Complications of Intracerebral Hemorrhage will be minimized Outcome: Progressing   Problem: Nutrition: Goal: Risk of aspiration will decrease Outcome: Progressing

## 2023-12-28 NOTE — Progress Notes (Signed)
 [facility]                                                                              William Todd, is a 73 y.o. male, DOB - 02/27/1951, ZOX:[id number] Admit date - 11/29/2023    Outpatient Primary MD for the patient is Knox Royalty, MD  LOS - 28  days  Chief Complaint  Patient presents with   Fall       Brief summary   Patient is a 73 year old male with HTN, recurrents mechanical fall, protein caloric malnutrition, DMT2, CKD3a, prostate cancer 2014 presented from [facility] while he is rehabbing his recent leg surgery and subsequently had a ground-level fall at [facility].  Workup on admission revealed acute intraparenchymal hemorrhage of the left frontal lobe with multiple bilateral small volume SAH.  Neurosurgery and Neurology consulted and because upon repeat imaging his ICH was worsening he was admitted to CCM for close monitoring.  Underwent multiple repeat head imaging and was then stabilized and Neurosurgery signed off.  Transferred to [facility] service on 12/09/2023.  Hospitalization has been complicated by intermittently Fevers likely from aspiration so was initiated on IV Unasyn ID was consulted. 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 -Repeat CT on 2/17 showed similar appearance of intraparenchymal hematoma in the left frontal lobe with associated mass effect and 5 mm rightward midline shift -LDL 57, hemoglobin A1c 6.1.  2D echo showed EF of 60 to6 5%.  Not able to tolerate MRI. -Seen by neurosurgery, neurology signed off -PEG tube placed on 12/23/2023, tube feeds started on 3/12, tolerating tube feeds -On aspirin 81 mg twice daily PTA, neurology recommending no antithrombotics now due to ICH. -Unclear baseline mental status, was noted to be confused on  admission.  No significant improvement, he is alert and awake, oriented to self but otherwise no meaningful conversation.   Cerebral Edema:  - Repeat CT 2/17, 5 mm rightward  midline shift.    Aspiration Pneumonia:   - S/p IV Unasyn x 5 days, COVID, RSV, influenza negative.  -Continue pulmonary toileting, I-S, Xopenex, Atrovent.  No fevers   Essential HTN:  -BP stable, continue amlodipine, metoprolol    Diabetes Mellitus Type 2: -Hemoglobin A1c 6.1 -CBG (last 3)   CBG (last 3)  Recent Labs    12/28/23 0406 12/28/23 0748 12/28/23 1218  GLUCAP 152* 152* 173*   Continue Semglee 10 units daily, NovoLog 2 units every 4 hours, moderate SSI   Recurrent mechanical falls:  PT/OT had recommended SNF wife wanted to take the patient home -PT recommending rehab   Hypernatremia -Sodium improving -Continue free water 200 cc every 4 hours   AKI on CKD Stage 3a:  -Baseline creatinine 1.4-1.5 -Creatinine at baseline   Abnormal LFTs:  -LFTs improving   Normocytic Anemia:  --Anemia panel showed iron 32, ferritin 566, folate 11.6, B12 874 -H&H stable  GERD -Continue PPI    Pressure injury documentation Left hip stage  II, POA Left knee posterior, stage III POA Left foot unstageable, POA Sacrum bilateral stage II Mid coccyx  Severe protein calorie malnutriton Nutrition Problem: Severe Malnutrition Etiology: chronic illness Signs/Symptoms: severe fat depletion, severe muscle depletion, percent weight loss Interventions: Tube feeding, MVI, Refer to RD note for recommendations  Estimated body mass index is 19.79 kg/m as calculated from the following:   Height as of this encounter: 6' (1.829 m).   Weight as of this encounter: 66.2 kg.  Code Status: DNR DVT Prophylaxis:  heparin injection 5,000 Units Start: 12/06/23 1000 SCDs Start: 11/30/23 0334 Place TED hose Start: 11/30/23 0334   Level of Care: Level of care: Progressive Family Communication:  Disposition Plan:       Remains inpatient appropriate: Awaiting SNF   Procedures:    Consultants:   Neurosurgery Neurology Interventional neurology  Antimicrobials:   Anti-infectives (From admission, onward)    Start     Dose/Rate Route Frequency Ordered Stop   12/23/23 1419  ceFAZolin (ANCEF) IVPB 2g/100 mL premix        over 30 Minutes  Continuous PRN 12/23/23 1419 12/23/23 1419   12/22/23 1745  Ampicillin-Sulbactam (UNASYN) 3 g in sodium chloride 0.9 % 100 mL IVPB        3 g 200 mL/hr over 30 Minutes Intravenous Every 6 hours 12/22/23 1651 12/26/23 1743   12/15/23 1200  Ampicillin-Sulbactam (UNASYN) 3 g in sodium chloride 0.9 % 100 mL IVPB        3 g 200 mL/hr over 30 Minutes Intravenous Every 6 hours 12/15/23 1018 12/20/23 0551   12/15/23 0845  ceFEPIme (MAXIPIME) 2 g in sodium chloride 0.9 % 100 mL IVPB  Status:  Discontinued        2 g 200 mL/hr over 30 Minutes Intravenous Every 12 hours 12/15/23 0745 12/15/23 1018   12/14/23 1515  Ampicillin-Sulbactam (UNASYN) 3 g in sodium chloride 0.9 % 100 mL IVPB  Status:  Discontinued        3 g 200 mL/hr over 30 Minutes Intravenous Every 6 hours 12/14/23 1422 12/15/23 0745          Medications  amantadine  100 mg Per Tube BID   amLODipine  5 mg Per Tube Daily   atorvastatin  40 mg Per Tube Daily   feeding supplement (PROSource TF20)  60 mL Per Tube Daily   free water  200 mL Per Tube Q4H   heparin injection (subcutaneous)  5,000 Units Subcutaneous Q12H   insulin aspart  0-15 Units Subcutaneous Q4H   insulin aspart  2 Units Subcutaneous Q4H   insulin glargine  10 Units Subcutaneous Daily   metoCLOPramide  10 mg Per Tube TID AC & HS   metoprolol tartrate  50 mg Per Tube BID   multivitamin with minerals  1 tablet Per Tube Daily   mouth rinse  15 mL Mouth Rinse 4 times per day   pantoprazole (PROTONIX) IV  40 mg Intravenous Q12H   sodium chloride flush  3 mL Intravenous Q12H      Subjective:   William Todd was seen and examined today.   Tolerating tube feeds, alert and awake however meaningful conversation or following commands.  Tolerating tube feeds.  Tracking with eyes.  Objective:   Vitals:   12/28/23 0404 12/28/23 0500 12/28/23 0747 12/28/23 1218  BP: 128/66  128/73 113/63  Pulse: 86  90 79  Resp: 18  18 18   Temp: 97.8 F (36.6 C)  98.4 F (36.9 C) 98.8  F (37.1 C)  TempSrc:   Oral Oral  SpO2: 100%  100% 100%  Weight:  66.2 kg    Height:        Intake/Output Summary (Last 24 hours) at 12/28/2023 1249 Last data filed at 12/28/2023 0810 Gross per 24 hour  Intake 630 ml  Output 1100 ml  Net -470 ml     Wt Readings from Last 3 Encounters:  12/28/23 66.2 kg  11/16/23 66.7 kg  12/23/13 74.7 kg   Physical Exam General: Alert, awake, oriented to self, tracking with eyes, appears comfortable Cardiovascular: S1 S2 clear, RRR.  Respiratory: CTAB Gastrointestinal: Soft, nontender, nondistended, NBS Ext: no pedal edema bilaterally Neuro: does not follow commands Psych: more alert today from yesterday however no meaningful conversation.  Data Reviewed:  I have personally reviewed following labs    CBC Lab Results  Component Value Date   WBC 11.1 (H) 12/28/2023   RBC 2.57 (L) 12/28/2023   HGB 7.6 (L) 12/28/2023   HCT 24.9 (L) 12/28/2023   MCV 96.9 12/28/2023   MCH 29.6 12/28/2023   PLT 150 12/28/2023   MCHC 30.5 12/28/2023   RDW 17.0 (H) 12/28/2023   LYMPHSABS 0.5 (L) 12/24/2023   MONOABS 0.3 12/24/2023   EOSABS 0.0 12/24/2023   BASOSABS 0.0 12/24/2023     Last metabolic panel Lab Results  Component Value Date   NA 140 12/28/2023   K 4.5 12/28/2023   CL 107 12/28/2023   CO2 26 12/28/2023   BUN 26 (H) 12/28/2023   CREATININE 1.41 (H) 12/28/2023   GLUCOSE 146 (H) 12/28/2023   GFRNONAA 53 (L) 12/28/2023   GFRAA 87 (L) 01/02/2013   CALCIUM 8.3 (L) 12/28/2023   PHOS 2.9 12/28/2023   PROT 6.1 (L) 12/24/2023   ALBUMIN 2.0 (L) 12/28/2023   BILITOT 0.7 12/24/2023   ALKPHOS 117 12/24/2023    AST 40 12/24/2023   ALT 66 (H) 12/24/2023   ANIONGAP 7 12/28/2023    CBG (last 3)  Recent Labs    12/28/23 0406 12/28/23 0748 12/28/23 1218  GLUCAP 152* 152* 173*      Coagulation Profile: Recent Labs  Lab 12/21/23 1509  INR 1.3*     Radiology Studies: I have personally reviewed the imaging studies  No results found.      Thad Ranger M.D. [facility] 12/28/2023, 12:49 PM  Available via Epic secure chat 7am-7pm After 7 pm, please refer to night coverage provider listed on amion.

## 2023-12-29 DIAGNOSIS — I619 Nontraumatic intracerebral hemorrhage, unspecified: Secondary | ICD-10-CM | POA: Diagnosis not present

## 2023-12-29 DIAGNOSIS — W19XXXA Unspecified fall, initial encounter: Secondary | ICD-10-CM | POA: Diagnosis not present

## 2023-12-29 DIAGNOSIS — I611 Nontraumatic intracerebral hemorrhage in hemisphere, cortical: Secondary | ICD-10-CM | POA: Diagnosis not present

## 2023-12-29 DIAGNOSIS — I609 Nontraumatic subarachnoid hemorrhage, unspecified: Secondary | ICD-10-CM | POA: Diagnosis not present

## 2023-12-29 LAB — GLUCOSE, CAPILLARY
Glucose-Capillary: 116 mg/dL — ABNORMAL HIGH (ref 70–99)
Glucose-Capillary: 125 mg/dL — ABNORMAL HIGH (ref 70–99)
Glucose-Capillary: 147 mg/dL — ABNORMAL HIGH (ref 70–99)
Glucose-Capillary: 164 mg/dL — ABNORMAL HIGH (ref 70–99)
Glucose-Capillary: 171 mg/dL — ABNORMAL HIGH (ref 70–99)
Glucose-Capillary: 175 mg/dL — ABNORMAL HIGH (ref 70–99)

## 2023-12-29 LAB — BPAM RBC
Blood Product Expiration Date: 202504122359
ISSUE DATE / TIME: 202503131322
Unit Type and Rh: 5100

## 2023-12-29 LAB — CBC
HCT: 25.6 % — ABNORMAL LOW (ref 39.0–52.0)
Hemoglobin: 7.8 g/dL — ABNORMAL LOW (ref 13.0–17.0)
MCH: 30 pg (ref 26.0–34.0)
MCHC: 30.5 g/dL (ref 30.0–36.0)
MCV: 98.5 fL (ref 80.0–100.0)
Platelets: 158 10*3/uL (ref 150–400)
RBC: 2.6 MIL/uL — ABNORMAL LOW (ref 4.22–5.81)
RDW: 16.8 % — ABNORMAL HIGH (ref 11.5–15.5)
WBC: 11.3 10*3/uL — ABNORMAL HIGH (ref 4.0–10.5)
nRBC: 0 % (ref 0.0–0.2)

## 2023-12-29 LAB — RENAL FUNCTION PANEL
Albumin: 2 g/dL — ABNORMAL LOW (ref 3.5–5.0)
Anion gap: 10 (ref 5–15)
BUN: 26 mg/dL — ABNORMAL HIGH (ref 8–23)
CO2: 24 mmol/L (ref 22–32)
Calcium: 8.4 mg/dL — ABNORMAL LOW (ref 8.9–10.3)
Chloride: 106 mmol/L (ref 98–111)
Creatinine, Ser: 1.33 mg/dL — ABNORMAL HIGH (ref 0.61–1.24)
GFR, Estimated: 56 mL/min — ABNORMAL LOW (ref >60)
Glucose, Bld: 141 mg/dL — ABNORMAL HIGH (ref 70–99)
Phosphorus: 3.4 mg/dL (ref 2.5–4.6)
Potassium: 4.1 mmol/L (ref 3.5–5.1)
Sodium: 140 mmol/L (ref 135–145)

## 2023-12-29 LAB — TYPE AND SCREEN
ABO/RH(D): O POS
Antibody Screen: NEGATIVE
Unit division: 0

## 2023-12-29 LAB — MAGNESIUM: Magnesium: 2 mg/dL (ref 1.7–2.4)

## 2023-12-29 NOTE — Progress Notes (Addendum)
 Speech Language Pathology Treatment: Dysphagia  Patient Details Name: William Todd MRN: [id number] DOB: 12-05-1950 Today's Date: 12/29/2023 Time: 1204-1226 SLP Time Calculation (min) (ACUTE ONLY): 22 min  Assessment / Plan / Recommendation Clinical Impression  Pt seen for dysphagia therapy with wife present and reviewed results of MBS and recommendations for trial of therapy and ice chip/thin water trials. Oral care provided with Yankeur suction. He was able to follow several basic commands with intermittent delays and repetition for volitional throat clears which were weak. Attempted Masako maneuver (to facilitate tongue base retraction) and pt able to protrude tongue inconsistently but not follow additional commands to complete maneuver. His attention waxed and waned but able to focus attention and sustain for moderate periods of time. Although following some simple commands he has difficulty executing exercises for swallow rehabilitation.  One ice chip administered followed by teaspoon sips thin water with audible timely appearing swallows and multiple swallows (ranging 2-4) likely to clear pharyngeal residue seen during MBS. He silently aspirated during MBS however today towards end of session he began to have delayed throat clear and mild cough possibly sensate to potential airway intrusion however unable to determine at bedside. Recommend continue to allow ice chips/teaspoon sips water for opportunities to utilize swallow musculature after oral care and ST to continue intervention in acute and at next venue to care    HPI HPI: William Todd is a 73 yo male admitted from [facility] 11/29/23 after a fall. PMH: HTN, recurrent falls, malnutrition, DM2, CKD, prostate cancer (2014). Acute left frontal lobe hematoma with multiple bilateral small SAH. CXR concerning for aspiration PNA. PEG placed 3/11      SLP Plan  Continue with current plan of care      Recommendations for follow up therapy  are one component of a multi-disciplinary discharge planning process, led by the attending physician.  Recommendations may be updated based on patient status, additional functional criteria and insurance authorization.    Recommendations  Diet recommendations: Other(comment);NPO (ice chips/sips thin water) Liquids provided via: Teaspoon Medication Administration: Via alternative means                  Oral care QID;Oral care prior to ice chip/H20   Frequent or constant Supervision/Assistance Dysphagia, oropharyngeal phase (R13.12)     Continue with current plan of care     Royce Macadamia  12/29/2023, 12:47 PM

## 2023-12-29 NOTE — Progress Notes (Signed)
   12/29/23 0059  Provider Notification  Provider Name/Title Dr Monica Becton  Date Provider Notified 12/29/23  Time Provider Notified [phone]  Method of Notification Page  Notification Reason Other (Comment) (CCMD called that pt had 9 beats of VTach, pt calm and sleeping in bed)  Provider response See new orders  Date of Provider Response 12/29/23  Time of Provider Response [phone]

## 2023-12-29 NOTE — Progress Notes (Signed)
 [facility]                                                                              William Todd, is a 73 y.o. male, DOB - 1951/03/24, [id number] Admit date - 11/29/2023    Outpatient Primary MD for the patient is Knox Royalty, MD  LOS - 29  days  Chief Complaint  Patient presents with   Fall       Brief summary   Patient is a 73 year old male with HTN, recurrents mechanical fall, protein caloric malnutrition, DMT2, CKD3a, prostate cancer 2014 presented from [facility] while he is rehabbing his recent leg surgery and subsequently had a ground-level fall at [facility].  Workup on admission revealed acute intraparenchymal hemorrhage of the left frontal lobe with multiple bilateral small volume SAH.  Neurosurgery and Neurology consulted and because upon repeat imaging his ICH was worsening he was admitted to CCM for close monitoring.  Underwent multiple repeat head imaging and was then stabilized and Neurosurgery signed off.  Transferred to [facility] service on 12/09/2023.  Hospitalization has been complicated by intermittently Fevers likely from aspiration so was initiated on IV Unasyn ID was consulted. 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 -Repeat CT on 2/17 showed similar appearance of intraparenchymal hematoma in the left frontal lobe with associated mass effect and 5 mm rightward midline shift -LDL 57, hemoglobin A1c 6.1.  2D echo showed EF of 60 to6 5%.  Not able to tolerate MRI. -Seen by neurosurgery, neurology signed off -PEG tube placed on 12/23/2023, tube feeds started on 3/12, tolerating tube feeds -On aspirin 81 mg twice daily PTA, neurology recommending no antithrombotics now due to ICH. -Unclear baseline mental status, was noted to be confused on  admission.  No significant improvement, he is alert and awake, oriented to self but otherwise no meaningful conversation. -Awaiting placement   Cerebral Edema:  - Repeat CT 2/17, 5 mm rightward  midline shift.    Aspiration Pneumonia:   - S/p IV Unasyn x 5 days, COVID, RSV, influenza negative.  -Continue pulmonary toileting, I-S, Xopenex, Atrovent.  No fevers   Essential HTN:  -BP stable, continue amlodipine, metoprolol    Diabetes Mellitus Type 2: -Hemoglobin A1c 6.1 -CBG (last 3)   CBG (last 3)  Recent Labs    12/29/23 0417 12/29/23 0753 12/29/23 1148  GLUCAP 164* 171* 147*   Continue Semglee 10 units daily, NovoLog 2 units every 4 hours, moderate SSI   Recurrent mechanical falls:  PT/OT had recommended SNF wife wanted to take the patient home -PT recommending rehab   Hypernatremia -Sodium improving -Continue free water 200 cc every 4 hours   AKI on CKD Stage 3a:  -Baseline creatinine 1.4-1.5 -Creatinine at baseline   Abnormal LFTs:  -LFTs improving   Normocytic Anemia:  --Anemia panel showed iron 32, ferritin 566, folate 11.6, B12 874 -H&H stable, transfuse for hemoglobin less than 7  GERD -Continue PPI  Pressure injury documentation Left hip stage II, POA Left knee posterior, stage III POA Left foot unstageable, POA Sacrum bilateral stage II Mid coccyx  Severe protein calorie malnutriton Nutrition Problem: Severe Malnutrition Etiology: chronic illness Signs/Symptoms: severe fat depletion, severe muscle depletion, percent weight loss Interventions: Tube feeding, MVI, Refer to RD note for recommendations  Estimated body mass index is 19.91 kg/m as calculated from the following:   Height as of this encounter: 6' (1.829 m).   Weight as of this encounter: 66.6 kg.  Code Status: DNR DVT Prophylaxis:  heparin injection 5,000 Units Start: 12/06/23 1000 SCDs Start: 11/30/23 0334 Place TED hose Start: 11/30/23 0334   Level of Care: Level of care:  Progressive Family Communication:  Disposition Plan:      Remains inpatient appropriate: Awaiting SNF   Procedures:    Consultants:   Neurosurgery Neurology Interventional neurology  Antimicrobials:   Anti-infectives (From admission, onward)    Start     Dose/Rate Route Frequency Ordered Stop   12/23/23 1419  ceFAZolin (ANCEF) IVPB 2g/100 mL premix        over 30 Minutes  Continuous PRN 12/23/23 1419 12/23/23 1419   12/22/23 1745  Ampicillin-Sulbactam (UNASYN) 3 g in sodium chloride 0.9 % 100 mL IVPB        3 g 200 mL/hr over 30 Minutes Intravenous Every 6 hours 12/22/23 1651 12/26/23 1743   12/15/23 1200  Ampicillin-Sulbactam (UNASYN) 3 g in sodium chloride 0.9 % 100 mL IVPB        3 g 200 mL/hr over 30 Minutes Intravenous Every 6 hours 12/15/23 1018 12/20/23 0551   12/15/23 0845  ceFEPIme (MAXIPIME) 2 g in sodium chloride 0.9 % 100 mL IVPB  Status:  Discontinued        2 g 200 mL/hr over 30 Minutes Intravenous Every 12 hours 12/15/23 0745 12/15/23 1018   12/14/23 1515  Ampicillin-Sulbactam (UNASYN) 3 g in sodium chloride 0.9 % 100 mL IVPB  Status:  Discontinued        3 g 200 mL/hr over 30 Minutes Intravenous Every 6 hours 12/14/23 1422 12/15/23 0745          Medications  amantadine  100 mg Per Tube BID   amLODipine  5 mg Per Tube Daily   atorvastatin  40 mg Per Tube Daily   feeding supplement (PROSource TF20)  60 mL Per Tube Daily   free water  200 mL Per Tube Q4H   heparin injection (subcutaneous)  5,000 Units Subcutaneous Q12H   insulin aspart  0-15 Units Subcutaneous Q4H   insulin aspart  2 Units Subcutaneous Q4H   insulin glargine  10 Units Subcutaneous Daily   metoCLOPramide  10 mg Per Tube TID AC & HS   metoprolol tartrate  50 mg Per Tube BID   multivitamin with minerals  1 tablet Per Tube Daily   mouth rinse  15 mL Mouth Rinse 4 times per day   pantoprazole (PROTONIX) IV  40 mg Intravenous Q12H   sodium chloride flush  3 mL Intravenous Q12H       Subjective:   William Todd was seen and examined today.  No acute issues overnight, alert and awake, tolerating tube feeds, awaiting placement.  Objective:   Vitals:   12/29/23 0412 12/29/23 0415 12/29/23 0751 12/29/23 1147  BP: 128/68  (!) 126/57 (!) 114/58  Pulse: 84  88 79  Resp: 18  18 18   Temp: 98.1 F (36.7 C)  98.6 F (37 C) 97.8  F (36.6 C)  TempSrc: Axillary  Oral Oral  SpO2: 100%  100% 100%  Weight:  66.6 kg    Height:        Intake/Output Summary (Last 24 hours) at 12/29/2023 1355 Last data filed at 12/29/2023 0415 Gross per 24 hour  Intake 2472.5 ml  Output 1200 ml  Net 1272.5 ml     Wt Readings from Last 3 Encounters:  12/29/23 66.6 kg  11/16/23 66.7 kg  12/23/13 74.7 kg    Physical Exam General: Alert and awake, appears comfortable, oriented to self, Cardiovascular: S1 S2 clear, RRR.  Respiratory: CTAB Gastrointestinal: Soft, nontender, nondistended, NBS Ext: no pedal edema bilaterally Neuro: no new deficits Psych: much more alert and awake now, but no meaningful conversation.  Data Reviewed:  I have personally reviewed following labs    CBC Lab Results  Component Value Date   WBC 11.3 (H) 12/29/2023   RBC 2.60 (L) 12/29/2023   HGB 7.8 (L) 12/29/2023   HCT 25.6 (L) 12/29/2023   MCV 98.5 12/29/2023   MCH 30.0 12/29/2023   PLT 158 12/29/2023   MCHC 30.5 12/29/2023   RDW 16.8 (H) 12/29/2023   LYMPHSABS 0.5 (L) 12/24/2023   MONOABS 0.3 12/24/2023   EOSABS 0.0 12/24/2023   BASOSABS 0.0 12/24/2023     Last metabolic panel Lab Results  Component Value Date   NA 140 12/29/2023   K 4.1 12/29/2023   CL 106 12/29/2023   CO2 24 12/29/2023   BUN 26 (H) 12/29/2023   CREATININE 1.33 (H) 12/29/2023   GLUCOSE 141 (H) 12/29/2023   GFRNONAA 56 (L) 12/29/2023   GFRAA 87 (L) 01/02/2013   CALCIUM 8.4 (L) 12/29/2023   PHOS 3.4 12/29/2023   PROT 6.1 (L) 12/24/2023   ALBUMIN 2.0 (L) 12/29/2023   BILITOT 0.7 12/24/2023   ALKPHOS 117  12/24/2023   AST 40 12/24/2023   ALT 66 (H) 12/24/2023   ANIONGAP 10 12/29/2023    CBG (last 3)  Recent Labs    12/29/23 0417 12/29/23 0753 12/29/23 1148  GLUCAP 164* 171* 147*      Coagulation Profile: No results for input(s): "INR", "PROTIME" in the last 168 hours.    Radiology Studies: I have personally reviewed the imaging studies  No results found.      Thad Ranger M.D. [facility] 12/29/2023, 1:55 PM  Available via Epic secure chat 7am-7pm After 7 pm, please refer to night coverage provider listed on amion.

## 2023-12-29 NOTE — Progress Notes (Signed)
 Physical Therapy Treatment Patient Details Name: William Todd MRN: [id number] DOB: 03/26/1951 Today's Date: 12/29/2023   History of Present Illness William Todd is a 73 y.o. male who presented on 2/15 after ground-level fall at SNF. 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 Cortrak replaced on 12/22/2023 and underwent PEG tube placement on 12/23/2023. PMHx includes HTN, recurrent mechanical falls, protein calorie malnutrition, DM2, CKD, and prostate cancer 2014, L foot fx with AFO,  L THA 11/16/23, anxiety, dementia    PT Comments  Pt seen for PT tx with pt's wife present for session. Pt is alert, but does not always focus on PT with eyes. PT facilitated sidelying>sitting with pt somewhat pushing trunk upright with RUE but then no longer participates. Pt requires total assist to attempt to sit EOB but pt with ongoing R lateral lean and returns to lying down (does require assistance to elevate BLE back onto bed). PT encouraged pt to attempt sitting EOB a 3rd time but pt declines. Encouraged pt to wash face with cloth with pt appearing to initiate with RUE but requires assistance to complete task, elevate R hand to face. Pt unable to remove cloth from forehead. Pt would benefit from ongoing PT services to progress mobility as able to decrease caregiver burden.   If plan is discharge home, recommend the following: Two people to help with walking and/or transfers;Two people to help with bathing/dressing/bathroom   Can travel by private vehicle     No  Equipment Recommendations  None recommended by PT    Recommendations for Other Services Rehab consult     Precautions / Restrictions Precautions Precautions: Fall;Other (comment) Recall of Precautions/Restrictions: Impaired Precaution/Restrictions Comments: seizure Required Braces or Orthoses: Other Brace Other Brace: L AFO Restrictions Weight Bearing Restrictions Per Provider Order: No      Mobility  Bed Mobility   Bed Mobility: Sidelying to Sit   Sidelying to sit: Total assist, HOB elevated, Used rails       General bed mobility comments: +2 to scoot to [facility]    Transfers                        Ambulation/Gait                   Stairs             Wheelchair Mobility     Tilt Bed    Modified Rankin (Stroke Patients Only)       Balance Overall balance assessment: Needs assistance Sitting-balance support: Bilateral upper extremity supported, Feet supported Sitting balance-Leahy Scale: Zero   Postural control: Right lateral lean                                  Communication Communication Communication: Impaired Factors Affecting Communication: Difficulty expressing self  Cognition Arousal: Alert Behavior During Therapy: Flat affect   PT - Cognitive impairments: Problem solving, Safety/Judgement, Attention, Orientation, Awareness, Memory, Initiation, Sequencing Difficult to assess due to: Impaired communication Orientation impairments: Place, Time, Situation, Person                   PT - Cognition Comments: Pt reports he's 73 y/o despite PT educating him on his correct age. Following commands: Impaired Following commands impaired: Follows one step commands inconsistently    Cueing Cueing Techniques: Verbal cues, Gestural cues, Tactile cues,  Visual cues  Exercises      General Comments        Pertinent Vitals/Pain Pain Assessment Pain Assessment: PAINAD Breathing: normal Negative Vocalization: none Facial Expression: smiling or inexpressive Body Language: relaxed Consolability: no need to console PAINAD Score: 0    Home Living                          Prior Function            PT Goals (current goals can now be found in the care plan section) Acute Rehab PT Goals Patient Stated Goal: to go to rehab to get stronger. PT Goal Formulation: With patient/family Time For  Goal Achievement: 01/07/24 Potential to Achieve Goals: Poor Progress towards PT goals:  (goals extended, pt making slow, inconsistent progress towards goals)    Frequency    Min 1X/week      PT Plan      Co-evaluation              AM-PAC PT "6 Clicks" Mobility   Outcome Measure  Help needed turning from your back to your side while in a flat bed without using bedrails?: Total Help needed moving from lying on your back to sitting on the side of a flat bed without using bedrails?: Total Help needed moving to and from a bed to a chair (including a wheelchair)?: Total Help needed standing up from a chair using your arms (e.g., wheelchair or bedside chair)?: Total Help needed to walk in hospital room?: Total Help needed climbing 3-5 steps with a railing? : Total 6 Click Score: 6    End of Session   Activity Tolerance: Patient tolerated treatment well Patient left: in bed;with call bell/phone within reach;with bed alarm set;with SCD's reapplied;with family/visitor present (BUE mittens donned) Nurse Communication: Mobility status PT Visit Diagnosis: Unsteadiness on feet (R26.81);History of falling (Z91.81);Difficulty in walking, not elsewhere classified (R26.2);Other abnormalities of gait and mobility (R26.89)     Time: 7253-6644 PT Time Calculation (min) (ACUTE ONLY): 15 min  Charges:    $Therapeutic Activity: 8-22 mins PT General Charges $$ ACUTE PT VISIT: 1 Visit                     Aleda Grana, PT, DPT 12/29/23, 12:01 PM   Sandi Mariscal 12/29/2023, 11:59 AM

## 2023-12-29 NOTE — TOC Progression Note (Signed)
 Transition of Care [facility]) - Progression Note    Patient Details  Name: William Todd MRN: [id number] Date of Birth: October 01, 1951  Transition of Care [facility]) CM/SW Contact  Baldemar Lenis, [place] Phone Number: 12/29/2023, 3:36 PM  Clinical Narrative:   CSW spoke with patient's wife, William Todd, to discuss SNF options. Joann toured [facility] and would like to accept their bed offer. CSW confirmed bed availability with [facility] and requested CMA to initiate insurance authorization. CSW to follow.     Expected Discharge Plan: Skilled Nursing Facility Barriers to Discharge: Continued Medical Work up, English as a second language teacher  Expected Discharge Plan and Services In-house Referral: Clinical Social Work, Hospice / Palliative Care     Living arrangements for the past 2 months: Single Family Home, Skilled Nursing Facility                                       Social Determinants of Health (SDOH) Interventions SDOH Screenings   Food Insecurity: No Food Insecurity (11/30/2023)  Housing: Low Risk  (11/30/2023)  Transportation Needs: No Transportation Needs (11/30/2023)  Utilities: Not At Risk (11/30/2023)  Social Connections: Moderately Integrated (11/30/2023)  Tobacco Use: Low Risk  (11/30/2023)    Readmission Risk Interventions    12/08/2023   12:40 PM  Readmission Risk Prevention Plan  Transportation Screening Complete  Medication Review (RN Care Manager) Complete  PCP or Specialist appointment within 3-5 days of discharge Complete  HRI or Home Care Consult Complete  SW Recovery Care/Counseling Consult Complete  Palliative Care Screening Complete  Skilled Nursing Facility Complete

## 2023-12-30 DIAGNOSIS — I619 Nontraumatic intracerebral hemorrhage, unspecified: Secondary | ICD-10-CM | POA: Diagnosis not present

## 2023-12-30 DIAGNOSIS — W19XXXA Unspecified fall, initial encounter: Secondary | ICD-10-CM | POA: Diagnosis not present

## 2023-12-30 DIAGNOSIS — I609 Nontraumatic subarachnoid hemorrhage, unspecified: Secondary | ICD-10-CM | POA: Diagnosis not present

## 2023-12-30 DIAGNOSIS — I611 Nontraumatic intracerebral hemorrhage in hemisphere, cortical: Secondary | ICD-10-CM | POA: Diagnosis not present

## 2023-12-30 LAB — GLUCOSE, CAPILLARY
Glucose-Capillary: 202 mg/dL — ABNORMAL HIGH (ref 70–99)
Glucose-Capillary: 128 mg/dL — ABNORMAL HIGH (ref 70–99)
Glucose-Capillary: 158 mg/dL — ABNORMAL HIGH (ref 70–99)
Glucose-Capillary: 247 mg/dL — ABNORMAL HIGH (ref 70–99)
Glucose-Capillary: 169 mg/dL — ABNORMAL HIGH (ref 70–99)

## 2023-12-30 LAB — CBC
HCT: 24.5 % — ABNORMAL LOW (ref 39.0–52.0)
Hemoglobin: 7.6 g/dL — ABNORMAL LOW (ref 13.0–17.0)
MCH: 30 pg (ref 26.0–34.0)
MCHC: 31 g/dL (ref 30.0–36.0)
MCV: 96.8 fL (ref 80.0–100.0)
Platelets: 157 10*3/uL (ref 150–400)
RBC: 2.53 MIL/uL — ABNORMAL LOW (ref 4.22–5.81)
RDW: 16.6 % — ABNORMAL HIGH (ref 11.5–15.5)
WBC: 10.2 10*3/uL (ref 4.0–10.5)
nRBC: 0 % (ref 0.0–0.2)

## 2023-12-30 MED ORDER — PANTOPRAZOLE SODIUM 40 MG PO TBEC: 40.0000 mg | DELAYED_RELEASE_TABLET | Freq: Two times a day (BID) | ORAL | Status: DC

## 2023-12-30 MED ORDER — BANATROL TF EN LIQD
60.0000 mL | Freq: Two times a day (BID) | ENTERAL | Status: DC
  Administered 2023-12-30 – 2024-01-01 (×5): 60 mL
  Filled 2023-12-30 (×5): qty 60

## 2023-12-30 MED ORDER — PANTOPRAZOLE SODIUM 40 MG IV SOLR
40.0000 mg | Freq: Two times a day (BID) | INTRAVENOUS | Status: DC
  Administered 2023-12-30 – 2024-01-01 (×4): 40 mg via INTRAVENOUS
  Filled 2023-12-30 (×4): qty 10

## 2023-12-30 NOTE — TOC Progression Note (Signed)
 Transition of Care [facility]) - Progression Note    Patient Details  Name: William Todd MRN: [id number] Date of Birth: 05-05-1951  Transition of Care [facility]) CM/SW Contact  Baldemar Lenis, [place] Phone Number: 12/30/2023, 10:55 AM  Clinical Narrative:   Patient received insurance approval to admit to SNF, but not medically stable at this time. CSW updated [facility], will continue to follow.    Expected Discharge Plan: Skilled Nursing Facility Barriers to Discharge: Continued Medical Work up  Expected Discharge Plan and Services In-house Referral: Clinical Social Work, Hospice / Palliative Care     Living arrangements for the past 2 months: Single Family Home, Skilled Nursing Facility                                       Social Determinants of Health (SDOH) Interventions SDOH Screenings   Food Insecurity: No Food Insecurity (11/30/2023)  Housing: Low Risk  (11/30/2023)  Transportation Needs: No Transportation Needs (11/30/2023)  Utilities: Not At Risk (11/30/2023)  Social Connections: Moderately Integrated (11/30/2023)  Tobacco Use: Low Risk  (11/30/2023)    Readmission Risk Interventions    12/08/2023   12:40 PM  Readmission Risk Prevention Plan  Transportation Screening Complete  Medication Review (RN Care Manager) Complete  PCP or Specialist appointment within 3-5 days of discharge Complete  HRI or Home Care Consult Complete  SW Recovery Care/Counseling Consult Complete  Palliative Care Screening Complete  Skilled Nursing Facility Complete

## 2023-12-30 NOTE — Plan of Care (Signed)
 Plan of care reviewed by nurse. Patient unable to participate in Plan of care management due to medical diagnosis. Patient made comfortable

## 2023-12-30 NOTE — Plan of Care (Signed)
  Problem: Coping: Goal: Ability to adjust to condition or change in health will improve Outcome: Not Applicable Note: MENTATION STILL IN QUESTIONS   Problem: Nutritional: Goal: Maintenance of adequate nutrition will improve Outcome: Not Progressing Note: PATIENT CURRENTLY WITH TUBE FEED VIA PEG

## 2023-12-30 NOTE — Progress Notes (Signed)
 [facility]                                                                              William Todd, is a 73 y.o. male, DOB - Sep 02, 1951, [id number] Admit date - 11/29/2023    Outpatient Primary MD for the patient is Knox Royalty, MD  LOS - 30  days  Chief Complaint  Patient presents with   Fall       Brief summary   Patient is a 73 year old male with HTN, recurrents mechanical fall, protein caloric malnutrition, DMT2, CKD3a, prostate cancer 2014 presented from [facility] while he is rehabbing his recent leg surgery and subsequently had a ground-level fall at [facility].  Workup on admission revealed acute intraparenchymal hemorrhage of the left frontal lobe with multiple bilateral small volume SAH.  Neurosurgery and Neurology consulted and because upon repeat imaging his ICH was worsening he was admitted to [facility] for close monitoring.  Underwent multiple repeat head imaging and was then stabilized and Neurosurgery signed off.  Transferred to [facility] service on 12/09/2023.  Hospitalization has been complicated by intermittently Fevers likely from aspiration so was initiated on IV Unasyn ID was consulted. 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 -Repeat CT on 2/17 showed similar appearance of intraparenchymal hematoma in the left frontal lobe with associated mass effect and 5 mm rightward midline shift -LDL 57, hemoglobin A1c 6.1.  2D echo showed EF of 60 to6 5%.  Not able to tolerate MRI. -Seen by neurosurgery, neurology signed off -PEG tube placed on 12/23/2023, tube feeds started on 3/12, tolerating tube feeds -On aspirin 81 mg twice daily PTA, neurology recommending no antithrombotics now due to ICH. -Unclear baseline mental status, was noted to be confused on  admission.  No significant improvement, he is alert and awake, oriented to self but otherwise no meaningful conversation. -Awaiting placement   Cerebral Edema:  - Repeat CT 2/17, 5 mm rightward  midline shift.    Aspiration Pneumonia:   - S/p IV Unasyn x 5 days, COVID, RSV, influenza negative.  -Continue pulmonary toileting, I-S, Xopenex, Atrovent.  No fevers  Dysphagia, on tube feeds, diarrhea -No fevers, leukocytosis, abdominal pain, low suspicion for C. Difficile -Will try Banatrol twice daily, p.o. Protonix, if possible change tube feeds to different brand   Essential HTN:  -BP stable, continue amlodipine, metoprolol    Diabetes Mellitus Type 2: -Hemoglobin A1c 6.1 -CBG (last 3)   CBG (last 3)  Recent Labs    12/30/23 0348 12/30/23 0803 12/30/23 1210  GLUCAP 202* 128* 158*   Continue Semglee 10 units daily, NovoLog 2 units every 4 hours, moderate SSI   Recurrent mechanical falls:  PT/OT had recommended SNF wife wanted to take the patient home -PT recommending rehab   Hypernatremia -Sodium improving -Continue free water 200 cc every 4 hours   AKI on CKD Stage 3a:  -Baseline creatinine 1.4-1.5 -Creatinine at baseline   Abnormal LFTs:  -  LFTs improving   Normocytic Anemia:  --Anemia panel showed iron 32, ferritin 566, folate 11.6, B12 874 -H&H stable, transfuse for hemoglobin less than 7  GERD -Continue PPI    Pressure injury documentation Left hip stage II, POA Left knee posterior, stage III POA Left foot unstageable, POA Sacrum bilateral stage II Mid coccyx  Severe protein calorie malnutriton Nutrition Problem: Severe Malnutrition Etiology: chronic illness Signs/Symptoms: severe fat depletion, severe muscle depletion, percent weight loss Interventions: Tube feeding, MVI, Refer to RD note for recommendations  Estimated body mass index is 19.91 kg/m as calculated from the following:   Height as of this encounter: 6' (1.829 m).   Weight as of this  encounter: 66.6 kg.  Code Status: DNR DVT Prophylaxis:  heparin injection 5,000 Units Start: 12/06/23 1000 SCDs Start: 11/30/23 0334 Place TED hose Start: 11/30/23 0334   Level of Care: Level of care: Progressive Family Communication:  Disposition Plan:      Remains inpatient appropriate: SNF once diarrhea improves, hopefully next 24 to 48 hours   Procedures:    Consultants:   Neurosurgery Neurology Interventional neurology  Antimicrobials:   Anti-infectives (From admission, onward)    Start     Dose/Rate Route Frequency Ordered Stop   12/23/23 1419  ceFAZolin (ANCEF) IVPB 2g/100 mL premix        over 30 Minutes  Continuous PRN 12/23/23 1419 12/23/23 1419   12/22/23 1745  Ampicillin-Sulbactam (UNASYN) 3 g in sodium chloride 0.9 % 100 mL IVPB        3 g 200 mL/hr over 30 Minutes Intravenous Every 6 hours 12/22/23 1651 12/26/23 1743   12/15/23 1200  Ampicillin-Sulbactam (UNASYN) 3 g in sodium chloride 0.9 % 100 mL IVPB        3 g 200 mL/hr over 30 Minutes Intravenous Every 6 hours 12/15/23 1018 12/20/23 0551   12/15/23 0845  ceFEPIme (MAXIPIME) 2 g in sodium chloride 0.9 % 100 mL IVPB  Status:  Discontinued        2 g 200 mL/hr over 30 Minutes Intravenous Every 12 hours 12/15/23 0745 12/15/23 1018   12/14/23 1515  Ampicillin-Sulbactam (UNASYN) 3 g in sodium chloride 0.9 % 100 mL IVPB  Status:  Discontinued        3 g 200 mL/hr over 30 Minutes Intravenous Every 6 hours 12/14/23 1422 12/15/23 0745          Medications  amantadine  100 mg Per Tube BID   amLODipine  5 mg Per Tube Daily   atorvastatin  40 mg Per Tube Daily   feeding supplement (PROSource TF20)  60 mL Per Tube Daily   fiber supplement (BANATROL TF)  60 mL Per Tube BID   free water  200 mL Per Tube Q4H   heparin injection (subcutaneous)  5,000 Units Subcutaneous Q12H   insulin aspart  0-15 Units Subcutaneous Q4H   insulin aspart  2 Units Subcutaneous Q4H   insulin glargine  10 Units Subcutaneous Daily    metoCLOPramide  10 mg Per Tube TID AC & HS   metoprolol tartrate  50 mg Per Tube BID   multivitamin with minerals  1 tablet Per Tube Daily   mouth rinse  15 mL Mouth Rinse 4 times per day   pantoprazole (PROTONIX) IV  40 mg Intravenous Q12H   sodium chloride flush  3 mL Intravenous Q12H      Subjective:   William Todd was seen and examined today.  Having diarrhea, no fevers, abdominal pain,  nausea or vomiting.   Objective:   Vitals:   12/29/23 2339 12/30/23 0347 12/30/23 0804 12/30/23 1116  BP: (!) 126/107 (!) 129/104 123/73 112/61  Pulse: 84 96 97 76  Resp: 18 18 18 18   Temp: 98.9 F (37.2 C) 98.7 F (37.1 C) 99.4 F (37.4 C) 98.3 F (36.8 C)  TempSrc:   Oral Axillary  SpO2: 97% 100% 100% 96%  Weight:      Height:        Intake/Output Summary (Last 24 hours) at 12/30/2023 1414 Last data filed at 12/30/2023 1222 Gross per 24 hour  Intake 2402.5 ml  Output 2450 ml  Net -47.5 ml     Wt Readings from Last 3 Encounters:  12/29/23 66.6 kg  11/16/23 66.7 kg  12/23/13 74.7 kg   Physical Exam General: Alert and awake, tracking, appears comfortable Cardiovascular: S1 S2 clear, RRR.  Respiratory: CTAB Gastrointestinal: Soft, nontender, nondistended, NBS Ext: no pedal edema bilaterally Neuro: does not follow commands Psych: more alert and awake however no meaningful conversation, oriented to self  Data Reviewed:  I have personally reviewed following labs    CBC Lab Results  Component Value Date   WBC 10.2 12/30/2023   RBC 2.53 (L) 12/30/2023   HGB 7.6 (L) 12/30/2023   HCT 24.5 (L) 12/30/2023   MCV 96.8 12/30/2023   MCH 30.0 12/30/2023   PLT 157 12/30/2023   MCHC 31.0 12/30/2023   RDW 16.6 (H) 12/30/2023   LYMPHSABS 0.5 (L) 12/24/2023   MONOABS 0.3 12/24/2023   EOSABS 0.0 12/24/2023   BASOSABS 0.0 12/24/2023     Last metabolic panel Lab Results  Component Value Date   NA 140 12/29/2023   K 4.1 12/29/2023   CL 106 12/29/2023   CO2 24 12/29/2023    BUN 26 (H) 12/29/2023   CREATININE 1.33 (H) 12/29/2023   GLUCOSE 141 (H) 12/29/2023   GFRNONAA 56 (L) 12/29/2023   GFRAA 87 (L) 01/02/2013   CALCIUM 8.4 (L) 12/29/2023   PHOS 3.4 12/29/2023   PROT 6.1 (L) 12/24/2023   ALBUMIN 2.0 (L) 12/29/2023   BILITOT 0.7 12/24/2023   ALKPHOS 117 12/24/2023   AST 40 12/24/2023   ALT 66 (H) 12/24/2023   ANIONGAP 10 12/29/2023    CBG (last 3)  Recent Labs    12/30/23 0348 12/30/23 0803 12/30/23 1210  GLUCAP 202* 128* 158*      Coagulation Profile: No results for input(s): "INR", "PROTIME" in the last 168 hours.    Radiology Studies: I have personally reviewed the imaging studies  No results found.      Thad Ranger M.D. [facility] 12/30/2023, 2:14 PM  Available via Epic secure chat 7am-7pm After 7 pm, please refer to night coverage provider listed on amion.

## 2023-12-30 NOTE — Progress Notes (Signed)
 Occupational Therapy Treatment Patient Details Name: William Todd MRN: [id number] DOB: 03-24-1951 Today's Date: 12/30/2023   History of present illness William Todd is a 73 y.o. male who presented on 2/15 after ground-level fall at SNF. 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 Cortrak replaced on 12/22/2023 and underwent PEG tube placement on 12/23/2023. PMHx includes HTN, recurrent mechanical falls, protein calorie malnutrition, DM2, CKD, and prostate cancer 2014, L foot fx with AFO,  L THA 11/16/23, anxiety, dementia   OT comments  Pt is making limited progress towards acute OT goals. Continues to be limited by deficit below. Pt received in bed, gazing towards ceiling. Moved into chair position to challenge ADL and inattention. Pt with R side visual neglect requiring MAX multimodal cues to attend to R side. Pt attended to R side briefly and with focused attention. Pt required hand over hand to TOTAL A for grooming tasks demonstrating minimal initiation and poor coordination throughout. Pt with decreased motor planning, sequencing, and initiating of task despite MAX multimodal cueing and increased time for processing. Pt answered "yes" "no" inconsistently throughout session. Pt required significantly increased time to process less than <10% of commands. OT to continue following pt acutely to address functional needs with discharge recommendations of follow-up inpatient therapy services <3hrs/day.      If plan is discharge home, recommend the following:  Two people to help with walking and/or transfers;A lot of help with bathing/dressing/bathroom;Assistance with feeding;Assistance with cooking/housework;Direct supervision/assist for medications management;Direct supervision/assist for financial management;Assist for transportation;Help with stairs or ramp for entrance;Supervision due to cognitive status   Equipment Recommendations  Other (comment) (defer)     Recommendations for Other Services      Precautions / Restrictions Precautions Precautions: Fall Recall of Precautions/Restrictions: Impaired Restrictions Weight Bearing Restrictions Per Provider Order: No       Mobility Bed Mobility Overal bed mobility: Needs Assistance             General bed mobility comments: Pt moved to chair position in bed.    Transfers Overall transfer level: Needs assistance                 General transfer comment: Did not attempt on this date     Balance Overall balance assessment: Needs assistance     Sitting balance - Comments: moved to chair position in bed                                   ADL either performed or assessed with clinical judgement   ADL Overall ADL's : Needs assistance/impaired     Grooming: Total assistance;Bed level                                 General ADL Comments: Pt required hand over hand to TOTAL A for grooming tasks. Pt with R side neglect and impaired cognition. Pt unable to motor plan, sequence, or initiate grooming task.    Extremity/Trunk Assessment Upper Extremity Assessment Upper Extremity Assessment: Difficult to assess due to impaired cognition;Generalized weakness   Lower Extremity Assessment Lower Extremity Assessment: Defer to PT evaluation        Vision   Vision Assessment?: Vision impaired- to be further tested in functional context   Perception Perception Perception: Impaired Preception Impairment Details: Inattention/Neglect Perception-Other Comments: R side  Praxis Praxis Praxis: Not tested   Communication Communication Communication: Impaired Factors Affecting Communication: Difficulty expressing self   Cognition Arousal: Alert Behavior During Therapy: Flat affect Cognition: History of cognitive impairments, Cognition impaired   Orientation impairments: Place, Time, Situation Awareness: Intellectual awareness impaired, Online  awareness impaired Memory impairment (select all impairments): Short-term memory Attention impairment (select first level of impairment): Focused attention Executive functioning impairment (select all impairments): Initiation, Organization, Sequencing, Reasoning, Problem solving OT - Cognition Comments: Pt with focused attention requiring MAX multimodal cues to attend during session. Pt inconsistently followed 1-step verbal commands <5% of session despite MAX cueing. Pt unable to state name as in previous session but answered to answer "yes" "no" inconsistently                 Following commands: Impaired Following commands impaired: Follows one step commands inconsistently      Cueing   Cueing Techniques: Verbal cues, Gestural cues, Tactile cues, Visual cues  Exercises      Shoulder Instructions       General Comments VSS on RA; wife at bedside    Pertinent Vitals/ Pain       Pain Assessment Pain Assessment: Faces Faces Pain Scale: Hurts little more Pain Location: LLE with PROM Pain Descriptors / Indicators: Guarding, Discomfort, Grimacing Pain Intervention(s): Limited activity within patient's tolerance  Home Living                                          Prior Functioning/Environment              Frequency  Min 1X/week        Progress Toward Goals  OT Goals(current goals can now be found in the care plan section)     Acute Rehab OT Goals Patient Stated Goal: none stated OT Goal Formulation: Patient unable to participate in goal setting Time For Goal Achievement: 01/13/24 Potential to Achieve Goals: Fair ADL Goals Pt Will Perform Eating: with min assist;sitting Pt Will Perform Grooming: with mod assist;sitting Pt Will Perform Lower Body Dressing: with mod assist;sit to/from stand Pt Will Transfer to Toilet: with max assist;squat pivot transfer;bedside commode Additional ADL Goal #1: pt will follow 2 step commands 50% of session  attempts Additional ADL Goal #2: pt will complete bed mobility mod (A) as precursor to adls  Plan      Co-evaluation                 AM-PAC OT "6 Clicks" Daily Activity     Outcome Measure   Help from another person eating meals?: Total Help from another person taking care of personal grooming?: Total Help from another person toileting, which includes using toliet, bedpan, or urinal?: Total Help from another person bathing (including washing, rinsing, drying)?: Total Help from another person to put on and taking off regular upper body clothing?: Total Help from another person to put on and taking off regular lower body clothing?: Total 6 Click Score: 6    End of Session    OT Visit Diagnosis: Unsteadiness on feet (R26.81);Other abnormalities of gait and mobility (R26.89);History of falling (Z91.81);Muscle weakness (generalized) (M62.81)   Activity Tolerance Patient tolerated treatment well   Patient Left in bed;with call bell/phone within reach;with family/visitor present   Nurse Communication Mobility status        Time: 1030-1107 OT Time Calculation (min): 37 min  Charges: OT General Charges $OT Visit: 1 Visit OT Treatments $Self Care/Home Management : 8-22 mins $Therapeutic Activity: 8-22 mins  [address], Darliss Cheney 12/30/2023, 1:33 PM

## 2023-12-31 DIAGNOSIS — I619 Nontraumatic intracerebral hemorrhage, unspecified: Secondary | ICD-10-CM | POA: Diagnosis not present

## 2023-12-31 DIAGNOSIS — W19XXXA Unspecified fall, initial encounter: Secondary | ICD-10-CM | POA: Diagnosis not present

## 2023-12-31 DIAGNOSIS — I609 Nontraumatic subarachnoid hemorrhage, unspecified: Secondary | ICD-10-CM | POA: Diagnosis not present

## 2023-12-31 DIAGNOSIS — I611 Nontraumatic intracerebral hemorrhage in hemisphere, cortical: Secondary | ICD-10-CM | POA: Diagnosis not present

## 2023-12-31 LAB — GLUCOSE, CAPILLARY
Glucose-Capillary: 119 mg/dL — ABNORMAL HIGH (ref 70–99)
Glucose-Capillary: 123 mg/dL — ABNORMAL HIGH (ref 70–99)
Glucose-Capillary: 130 mg/dL — ABNORMAL HIGH (ref 70–99)
Glucose-Capillary: 170 mg/dL — ABNORMAL HIGH (ref 70–99)
Glucose-Capillary: 177 mg/dL — ABNORMAL HIGH (ref 70–99)
Glucose-Capillary: 59 mg/dL — ABNORMAL LOW (ref 70–99)
Glucose-Capillary: 92 mg/dL (ref 70–99)

## 2023-12-31 MED ORDER — INSULIN GLARGINE 100 UNIT/ML ~~LOC~~ SOLN
7.0000 [IU] | Freq: Every day | SUBCUTANEOUS | Status: DC
  Administered 2023-12-31 – 2024-01-01 (×2): 7 [IU] via SUBCUTANEOUS
  Filled 2023-12-31 (×2): qty 0.07

## 2023-12-31 MED ORDER — DEXTROSE 50 % IV SOLN
12.5000 g | INTRAVENOUS | Status: AC
  Administered 2023-12-31: 12.5 g via INTRAVENOUS

## 2023-12-31 MED ORDER — OXYCODONE-ACETAMINOPHEN 5-325 MG PO TABS
1.0000 | ORAL_TABLET | Freq: Four times a day (QID) | ORAL | Status: DC | PRN
  Administered 2023-12-31: 1 via ORAL
  Filled 2023-12-31: qty 1

## 2023-12-31 MED ORDER — DEXTROSE 50 % IV SOLN
INTRAVENOUS | Status: AC
  Filled 2023-12-31: qty 50

## 2023-12-31 NOTE — Progress Notes (Signed)
 Bilat hand mitts removed at this time.  ABD binder in place, TF via PEG infusing.  WCTM

## 2023-12-31 NOTE — Progress Notes (Signed)
 [facility]                                                                              William Todd, is a 73 y.o. male, DOB - 1951/06/07, [id number] Admit date - 11/29/2023    Outpatient Primary MD for the patient is Knox Royalty, MD  LOS - 31  days  Chief Complaint  Patient presents with   Fall       Brief summary   Patient is a 73 year old male with HTN, recurrents mechanical fall, protein caloric malnutrition, DMT2, CKD3a, prostate cancer 2014 presented from [facility] while he is rehabbing his recent leg surgery and subsequently had a ground-level fall at [facility].  Workup on admission revealed acute intraparenchymal hemorrhage of the left frontal lobe with multiple bilateral small volume SAH.  Neurosurgery and Neurology consulted and because upon repeat imaging his ICH was worsening he was admitted to [facility] for close monitoring.  Underwent multiple repeat head imaging and was then stabilized and Neurosurgery signed off.  Transferred to [facility] service on 12/09/2023.  Hospitalization has been complicated by intermittently Fevers likely from aspiration so was initiated on IV Unasyn ID was consulted. 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 -Repeat CT on 2/17 showed similar appearance of intraparenchymal hematoma in the left frontal lobe with associated mass effect and 5 mm rightward midline shift -LDL 57, hemoglobin A1c 6.1.  2D echo showed EF of 60 to6 5%.  Not able to tolerate MRI. -Seen by neurosurgery, neurology signed off -PEG tube placed on 12/23/2023, tube feeds started on 3/12, tolerating tube feeds -On aspirin 81 mg twice daily PTA, neurology recommending no antithrombotics now due to ICH. -Unclear baseline mental status, was noted to be confused on  admission.  Alert and awake, responds to questions in a very soft voice.  Appears close to his baseline. -Awaiting placement   Cerebral Edema:  - Repeat CT 2/17, 5 mm rightward  midline shift.    Aspiration Pneumonia:   - S/p IV Unasyn x 5 days, COVID, RSV, influenza negative.  -Continue pulmonary toileting, I-S, Xopenex, Atrovent.  No fevers  Dysphagia, on tube feeds, diarrhea -No fevers, leukocytosis, abdominal pain, low suspicion for C. Difficile -Will try Banatrol twice daily, p.o. Protonix, if possible change tube feeds to different brand   Essential HTN:  -BP stable, continue amlodipine, metoprolol    Diabetes Mellitus Type 2: -Hemoglobin A1c 6.1 -CBG (last 3)   CBG (last 3)  Recent Labs    12/31/23 0748 12/31/23 0853 12/31/23 1218  GLUCAP 59* 170* 92   Hypoglycemia this morning, discontinued meal coverage, decrease Semglee to 70 units daily, continue SSI  Recurrent mechanical falls:  PT/OT had recommended SNF   Hypernatremia -Sodium improving -Continue free water 200 cc every 4 hours   AKI on CKD Stage 3a:  -Baseline creatinine 1.4-1.5 -Creatinine at baseline   Abnormal LFTs:  -LFTs improving   Normocytic Anemia:  --Anemia panel  showed iron 32, ferritin 566, folate 11.6, B12 874 -H&H stable, transfuse for hemoglobin less than 7  GERD -Continue PPI    Pressure injury documentation Left hip stage II, POA Left knee posterior, stage III POA Left foot unstageable, POA Sacrum bilateral stage II Mid coccyx  Severe protein calorie malnutriton Nutrition Problem: Severe Malnutrition Etiology: chronic illness Signs/Symptoms: severe fat depletion, severe muscle depletion, percent weight loss Interventions: Tube feeding, MVI, Refer to RD note for recommendations  Estimated body mass index is 19.91 kg/m as calculated from the following:   Height as of this encounter: 6' (1.829 m).   Weight as of this encounter: 66.6 kg.  Code Status: DNR DVT  Prophylaxis:  heparin injection 5,000 Units Start: 12/06/23 1000 SCDs Start: 11/30/23 0334 Place TED hose Start: 11/30/23 0334   Level of Care: Level of care: Progressive Family Communication:  Disposition Plan:      Remains inpatient appropriate: SNF hopefully tomorrow   Procedures:    Consultants:   Neurosurgery Neurology Interventional neurology  Antimicrobials:   Anti-infectives (From admission, onward)    Start     Dose/Rate Route Frequency Ordered Stop   12/23/23 1419  ceFAZolin (ANCEF) IVPB 2g/100 mL premix        over 30 Minutes  Continuous PRN 12/23/23 1419 12/23/23 1419   12/22/23 1745  Ampicillin-Sulbactam (UNASYN) 3 g in sodium chloride 0.9 % 100 mL IVPB        3 g 200 mL/hr over 30 Minutes Intravenous Every 6 hours 12/22/23 1651 12/26/23 1743   12/15/23 1200  Ampicillin-Sulbactam (UNASYN) 3 g in sodium chloride 0.9 % 100 mL IVPB        3 g 200 mL/hr over 30 Minutes Intravenous Every 6 hours 12/15/23 1018 12/20/23 0551   12/15/23 0845  ceFEPIme (MAXIPIME) 2 g in sodium chloride 0.9 % 100 mL IVPB  Status:  Discontinued        2 g 200 mL/hr over 30 Minutes Intravenous Every 12 hours 12/15/23 0745 12/15/23 1018   12/14/23 1515  Ampicillin-Sulbactam (UNASYN) 3 g in sodium chloride 0.9 % 100 mL IVPB  Status:  Discontinued        3 g 200 mL/hr over 30 Minutes Intravenous Every 6 hours 12/14/23 1422 12/15/23 0745          Medications  amantadine  100 mg Per Tube BID   amLODipine  5 mg Per Tube Daily   atorvastatin  40 mg Per Tube Daily   dextrose       feeding supplement (PROSource TF20)  60 mL Per Tube Daily   fiber supplement (BANATROL TF)  60 mL Per Tube BID   free water  200 mL Per Tube Q4H   heparin injection (subcutaneous)  5,000 Units Subcutaneous Q12H   insulin aspart  0-15 Units Subcutaneous Q4H   insulin glargine  7 Units Subcutaneous Daily   metoCLOPramide  10 mg Per Tube TID AC & HS   metoprolol tartrate  50 mg Per Tube BID   multivitamin with  minerals  1 tablet Per Tube Daily   mouth rinse  15 mL Mouth Rinse 4 times per day   pantoprazole (PROTONIX) IV  40 mg Intravenous Q12H   sodium chloride flush  3 mL Intravenous Q12H      Subjective:   Darald Uzzle was seen and examined today.  No acute issues overnight, no fevers, abdominal pain nausea vomiting.    Objective:   Vitals:   12/31/23 0030 12/31/23 0413 12/31/23  0751 12/31/23 1222  BP: (!) 109/57 (!) 105/57 121/65 116/60  Pulse: 79 80 88 81  Resp: 18 18 16 19   Temp: 99.5 F (37.5 C) 99.6 F (37.6 C) 98.6 F (37 C) 98.4 F (36.9 C)  TempSrc: Oral Oral Oral Oral  SpO2:  100% 100% 100%  Weight:      Height:        Intake/Output Summary (Last 24 hours) at 12/31/2023 1320 Last data filed at 12/31/2023 1045 Gross per 24 hour  Intake 435 ml  Output 1550 ml  Net -1115 ml     Wt Readings from Last 3 Encounters:  12/29/23 66.6 kg  11/16/23 66.7 kg  12/23/13 74.7 kg   Physical Exam General: Alert, awake, speaks in very soft voice, appears comfortable Cardiovascular: S1 S2 clear, RRR.  Respiratory: CTAB Gastrointestinal: Soft, nontender, nondistended, NBS Ext: no pedal edema bilaterally Neuro: no new deficits Psych: more alert and awake,  Data Reviewed:  I have personally reviewed following labs    CBC Lab Results  Component Value Date   WBC 10.2 12/30/2023   RBC 2.53 (L) 12/30/2023   HGB 7.6 (L) 12/30/2023   HCT 24.5 (L) 12/30/2023   MCV 96.8 12/30/2023   MCH 30.0 12/30/2023   PLT 157 12/30/2023   MCHC 31.0 12/30/2023   RDW 16.6 (H) 12/30/2023   LYMPHSABS 0.5 (L) 12/24/2023   MONOABS 0.3 12/24/2023   EOSABS 0.0 12/24/2023   BASOSABS 0.0 12/24/2023     Last metabolic panel Lab Results  Component Value Date   NA 140 12/29/2023   K 4.1 12/29/2023   CL 106 12/29/2023   CO2 24 12/29/2023   BUN 26 (H) 12/29/2023   CREATININE 1.33 (H) 12/29/2023   GLUCOSE 141 (H) 12/29/2023   GFRNONAA 56 (L) 12/29/2023   GFRAA 87 (L) 01/02/2013    CALCIUM 8.4 (L) 12/29/2023   PHOS 3.4 12/29/2023   PROT 6.1 (L) 12/24/2023   ALBUMIN 2.0 (L) 12/29/2023   BILITOT 0.7 12/24/2023   ALKPHOS 117 12/24/2023   AST 40 12/24/2023   ALT 66 (H) 12/24/2023   ANIONGAP 10 12/29/2023    CBG (last 3)  Recent Labs    12/31/23 0748 12/31/23 0853 12/31/23 1218  GLUCAP 59* 170* 92      Coagulation Profile: No results for input(s): "INR", "PROTIME" in the last 168 hours.    Radiology Studies: I have personally reviewed the imaging studies  No results found.      Thad Ranger M.D. [facility] 12/31/2023, 1:20 PM  Available via Epic secure chat 7am-7pm After 7 pm, please refer to night coverage provider listed on amion.

## 2023-12-31 NOTE — Progress Notes (Signed)
 [facility] night cross cover note:   I was notified by RN that the patient is complaining of left lower extremity discomfort.  I subsequently added prn Percocet to address this pain.      Newton Pigg, DO Hospitalist

## 2023-12-31 NOTE — Plan of Care (Signed)
  Problem: Metabolic: Goal: Ability to maintain appropriate glucose levels will improve Outcome: Progressing   Problem: Nutritional: Goal: Maintenance of adequate nutrition will improve Outcome: Progressing   Problem: Tissue Perfusion: Goal: Adequacy of tissue perfusion will improve Outcome: Progressing   Problem: Clinical Measurements: Goal: Will remain free from infection Outcome: Progressing Goal: Diagnostic test results will improve Outcome: Progressing Goal: Respiratory complications will improve Outcome: Progressing Goal: Cardiovascular complication will be avoided Outcome: Progressing   Problem: Nutrition: Goal: Adequate nutrition will be maintained Outcome: Progressing   Problem: Coping: Goal: Level of anxiety will decrease Outcome: Progressing   Problem: Elimination: Goal: Will not experience complications related to bowel motility Outcome: Progressing Goal: Will not experience complications related to urinary retention Outcome: Progressing   Problem: Pain Managment: Goal: General experience of comfort will improve and/or be controlled Outcome: Progressing   Problem: Safety: Goal: Ability to remain free from injury will improve Outcome: Progressing

## 2023-12-31 NOTE — TOC Progression Note (Signed)
 Transition of Care [facility]) - Progression Note    Patient Details  Name: William Todd MRN: [id number] Date of Birth: 11-Jan-1951  Transition of Care [facility]) CM/SW Contact  Baldemar Lenis, [place] Phone Number: 12/31/2023, 3:47 PM  Clinical Narrative:   Per MD, discharge tomorrow is possible. CSW confirmed authorization will still be approved tomorrow, and [facility] has a bed available. Patient will need to be out of mitts for 24 hours. CSW updated RN and MD. CSW to follow.    Expected Discharge Plan: Skilled Nursing Facility Barriers to Discharge: Continued Medical Work up  Expected Discharge Plan and Services In-house Referral: Clinical Social Work, Hospice / Palliative Care     Living arrangements for the past 2 months: Single Family Home, Skilled Nursing Facility                                       Social Determinants of Health (SDOH) Interventions SDOH Screenings   Food Insecurity: No Food Insecurity (11/30/2023)  Housing: Low Risk  (11/30/2023)  Transportation Needs: No Transportation Needs (11/30/2023)  Utilities: Not At Risk (11/30/2023)  Social Connections: Moderately Integrated (11/30/2023)  Tobacco Use: Low Risk  (11/30/2023)    Readmission Risk Interventions    12/08/2023   12:40 PM  Readmission Risk Prevention Plan  Transportation Screening Complete  Medication Review (RN Care Manager) Complete  PCP or Specialist appointment within 3-5 days of discharge Complete  HRI or Home Care Consult Complete  SW Recovery Care/Counseling Consult Complete  Palliative Care Screening Complete  Skilled Nursing Facility Complete

## 2024-01-01 LAB — GLUCOSE, CAPILLARY
Glucose-Capillary: 119 mg/dL — ABNORMAL HIGH (ref 70–99)
Glucose-Capillary: 153 mg/dL — ABNORMAL HIGH (ref 70–99)
Glucose-Capillary: 186 mg/dL — ABNORMAL HIGH (ref 70–99)
Glucose-Capillary: 204 mg/dL — ABNORMAL HIGH (ref 70–99)

## 2024-01-01 MED ORDER — FREE WATER: 200.0000 mL | Status: DC

## 2024-01-01 MED ORDER — AMLODIPINE BESYLATE 5 MG PO TABS: 5.0000 mg | ORAL_TABLET | Freq: Every day | ORAL | Status: DC

## 2024-01-01 MED ORDER — ADULT MULTIVITAMIN W/MINERALS CH: 1.0000 | ORAL_TABLET | Freq: Every day | ORAL | Status: DC

## 2024-01-01 MED ORDER — INSULIN ASPART 100 UNIT/ML IJ SOLN
0.0000 [IU] | INTRAMUSCULAR | Status: DC
Start: 2024-01-01 — End: 2024-01-15

## 2024-01-01 MED ORDER — BANATROL TF EN LIQD: 60.0000 mL | Freq: Two times a day (BID) | ENTERAL | Status: DC

## 2024-01-01 MED ORDER — OSMOLITE 1.5 CAL PO LIQD: 1000.0000 mL | ORAL | Status: DC

## 2024-01-01 MED ORDER — METOPROLOL TARTRATE 50 MG PO TABS: 50.0000 mg | ORAL_TABLET | Freq: Two times a day (BID) | ORAL | Status: DC

## 2024-01-01 MED ORDER — ATORVASTATIN CALCIUM 80 MG PO TABS: 40.0000 mg | ORAL_TABLET | Freq: Every day | ORAL | Status: DC

## 2024-01-01 MED ORDER — METOCLOPRAMIDE HCL 5 MG/5ML PO SOLN: 10.0000 mg | Freq: Three times a day (TID) | ORAL | Status: DC

## 2024-01-01 MED ORDER — PROSOURCE TF20 ENFIT COMPATIBL EN LIQD: 60.0000 mL | Freq: Every day | ENTERAL | Status: DC

## 2024-01-01 MED ORDER — LEVALBUTEROL HCL 0.63 MG/3ML IN NEBU: 0.6300 mg | INHALATION_SOLUTION | Freq: Four times a day (QID) | RESPIRATORY_TRACT | Status: DC | PRN

## 2024-01-01 MED ORDER — OMEPRAZOLE 20 MG PO CPDR: 20.0000 mg | DELAYED_RELEASE_CAPSULE | Freq: Two times a day (BID) | ORAL | Status: DC

## 2024-01-01 MED ORDER — PANTOPRAZOLE SODIUM 40 MG PO PACK: 40.0000 mg | PACK | Freq: Two times a day (BID) | ORAL | Status: DC

## 2024-01-01 MED ORDER — ACETAMINOPHEN 160 MG/5ML PO SOLN: 650.0000 mg | Freq: Four times a day (QID) | ORAL | Status: DC | PRN

## 2024-01-01 MED ORDER — AMANTADINE HCL 50 MG/5ML PO SOLN: 100.0000 mg | Freq: Two times a day (BID) | ORAL | Status: DC

## 2024-01-01 MED ORDER — INSULIN GLARGINE 100 UNIT/ML ~~LOC~~ SOLN: 7.0000 [IU] | Freq: Every day | SUBCUTANEOUS | Status: DC

## 2024-01-01 NOTE — Plan of Care (Signed)
 Pt more verbal this shift. Communicated pain level and need for pain medication. Pt also asked for water and was able to perform tasks when asked to relax when being cleaned/turned. Will continue to monitor progress.

## 2024-01-01 NOTE — Discharge Summary (Addendum)
 Physician Discharge Summary   Patient: William Todd MRN: [id number] DOB: [birth year]  Admit date:     11/29/2023  Discharge date: 01/01/24  Discharge Physician: Thad Ranger, MD    PCP: Knox Royalty, MD   Recommendations at discharge:   Continue Osmolite 1.5 Cal at 50 cc an hour.  Can substitute to facility tube feeding protocol as needed. Continue Prosource to 60 mL per tube daily, Benadryl PF 60 mL per tube twice daily Continue free water 200 cc per tube every 4 hours  Discharge Diagnoses:  Left frontal large ICH hemorrhage (HCC) Cerebral edema   Acute metabolic encephalopathy Aspiration pneumonia Dysphagia on tube feeds   Acute kidney injury superimposed on chronic kidney disease IIIa (HCC)   Essential hypertension   History of prostate cancer   Non-insulin dependent type 2 diabetes mellitus (HCC)   Normocytic anemia   History of mood disorder   HLD (hyperlipidemia)   Protein-calorie malnutrition, severe Recurrent mechanical falls Hypernatremia   Hospital Course:  Patient is a 73 year old male with HTN, recurrents mechanical fall, protein caloric malnutrition, DMT2, CKD3a, prostate cancer 2014 presented from [facility] while he is rehabbing his recent leg surgery and subsequently had a ground-level fall at [facility].  Workup on admission revealed acute intraparenchymal hemorrhage of the left frontal lobe with multiple bilateral small volume SAH.  Neurosurgery and Neurology consulted and because upon repeat imaging his ICH was worsening he was admitted to CCM for close monitoring.  Underwent multiple repeat head imaging and was then stabilized and Neurosurgery signed off.  Transferred to [facility] service on 12/09/2023.  Hospitalization has been complicated by intermittently Fevers likely from aspiration so was initiated on IV Unasyn ID was consulted. PEG tube placed on 12/23/2023  Assessment and Plan:   Left Frontal large ICH, etiology unclear, traumatic ICH versus spontaneous  ICH -Presented with bilateral SAH secondary to traumatic fall, s/p 7 days of prophylactic Keppra. -Repeat CT on 2/17 showed similar appearance of intraparenchymal hematoma in the left frontal lobe with associated mass effect and 5 mm rightward midline shift -LDL 57, hemoglobin A1c 6.1.  2D echo showed EF of 60 to6 5%.  Not able to tolerate MRI. -Seen by neurosurgery, neurology signed off -PEG tube placed on 12/23/2023, tube feeds started on 3/12, tolerating tube feeds -On aspirin 81 mg twice daily PTA, neurology recommending no antithrombotics now due to ICH. -Unclear baseline mental status, was noted to be confused on admission.  Alert and awake, responds to questions in a soft voice.  Appears close to his baseline.   Cerebral Edema:  -Repeat CT head on 12/12/2023 showed expected evolution of large left frontal hematoma with intraventricular and subarachnoid extension.  Anterior midline shift similar to prior at 7 mm.  No new abnormality.     Aspiration Pneumonia:   - S/p IV Unasyn x 5 days, COVID, RSV, influenza negative.  -Continue pulmonary toileting, I-S, Xopenex, Atrovent.  No fevers   Dysphagia, on tube feeds, diarrhea -No fevers, leukocytosis, abdominal pain, low suspicion for C. Difficile -Diarrhea improving, continue Banatrol Reglan, PPI  Essential HTN:  -BP stable, continue amlodipine, metoprolol    Diabetes Mellitus Type 2: -Hemoglobin A1c 6.1 Continue SSI moderate, Lantus 7 units daily   Recurrent mechanical falls:  PT/OT had recommended SNF   Hypernatremia -Sodium improving, 148 on 12/22/2023 -Continue free water 200 cc every 4 hours, sodium 140 on discharge   AKI on CKD Stage 3a:  -Baseline creatinine 1.4-1.5 -Creatinine at baseline 1.3   Abnormal LFTs:  -  LFTs improving    Normocytic Anemia:  --Anemia panel showed iron 32, ferritin 566, folate 11.6, B12 874 -H&H stable, 7.6.   Transfuse for hemoglobin less than 7  GERD -Continue PPI     Pressure injury  documentation Left hip stage II, POA Left knee posterior, stage III POA Left foot unstageable, POA Sacrum bilateral stage II Mid coccyx   Severe protein calorie malnutriton Nutrition Problem: Severe Malnutrition Etiology: chronic illness Signs/Symptoms: severe fat depletion, severe muscle depletion, percent weight loss Interventions: Tube feeding, MVI, Refer to RD note for recommendations   Estimated body mass index is 19.91 kg/m as calculated from the following:   Height as of this encounter: 6' (1.829 m).   Weight as of this encounter: 66.6 kg.      Pain control - [employer] Controlled Substance Reporting System database was reviewed. and patient was instructed, not to drive, operate heavy machinery, perform activities at heights, swimming or participation in water activities or provide baby-sitting services while on Pain, Sleep and Anxiety Medications; until their outpatient Physician has advised to do so again. Also recommended to not to take more than prescribed Pain, Sleep and Anxiety Medications.  Consultants: Neurosurgery, Neurology, Interventional neurology Procedures performed:   Disposition: SNF Diet recommendation: N.p.o. on tube feeds  DISCHARGE MEDICATION: Allergies as of 01/01/2024   No Known Allergies      Medication List     STOP taking these medications    acetaminophen 325 MG tablet Commonly known as: TYLENOL Replaced by: acetaminophen 160 MG/5ML solution   aspirin EC 81 MG tablet   famotidine 20 MG tablet Commonly known as: Pepcid   metoprolol succinate 50 MG 24 hr tablet Commonly known as: TOPROL-XL   nitroGLYCERIN 0.4 MG SL tablet Commonly known as: NITROSTAT   polyethylene glycol powder 17 GM/SCOOP powder Commonly known as: MiraLax   senna-docusate 8.6-50 MG tablet Commonly known as: Senokot-S   sitaGLIPtin 50 MG tablet Commonly known as: JANUVIA       TAKE these medications    acetaminophen 160 MG/5ML solution Commonly  known as: TYLENOL Place 20.3 mLs (650 mg total) into feeding tube every 6 (six) hours as needed for fever or mild pain (pain score 1-3). Replaces: acetaminophen 325 MG tablet   amantadine 50 MG/5ML solution Commonly known as: SYMMETREL Place 10 mLs (100 mg total) into feeding tube 2 (two) times daily.   amLODipine 5 MG tablet Commonly known as: NORVASC Place 1 tablet (5 mg total) into feeding tube daily. What changed: how to take this   atorvastatin 80 MG tablet Commonly known as: LIPITOR Place 0.5 tablets (40 mg total) into feeding tube daily at 6 PM. What changed: how to take this   feeding supplement (OSMOLITE 1.5 CAL) Liqd Place 1,000 mLs into feeding tube continuous. What changed:  how much to take how to take this when to take this   feeding supplement (PROSource TF20) liquid Place 60 mLs into feeding tube daily. Start taking on: January 02, 2024   fiber supplement (BANATROL TF) liquid Place 60 mLs into feeding tube 2 (two) times daily.   free water Soln Place 200 mLs into feeding tube every 4 (four) hours.   insulin aspart 100 UNIT/ML injection Commonly known as: novoLOG Inject 0-15 Units into the skin every 4 (four) hours. Sliding scale  CBG 70 - 120: 0 units: CBG 121 - 150: 2 units; CBG 151 - 200: 3 units; CBG 201 - 250: 5 units; CBG 251 - 300: 8 units;CBG 301 -  350: 11 units; CBG 351 - 400: 15 units; CBG > 400 : 15 units and notify MD   insulin glargine 100 UNIT/ML injection Commonly known as: LANTUS Inject 0.07 mLs (7 Units total) into the skin daily. Start taking on: January 02, 2024   levalbuterol 0.63 MG/3ML nebulizer solution Commonly known as: XOPENEX Take 3 mLs (0.63 mg total) by nebulization every 6 (six) hours as needed for wheezing or shortness of breath.   metoCLOPramide 5 MG/5ML solution Commonly known as: REGLAN Place 10 mLs (10 mg total) into feeding tube 4 (four) times daily -  before meals and at bedtime.   metoprolol tartrate 50 MG  tablet Commonly known as: LOPRESSOR Place 1 tablet (50 mg total) into feeding tube 2 (two) times daily.   multivitamin with minerals Tabs tablet Place 1 tablet into feeding tube daily. Start taking on: January 02, 2024   omeprazole 20 MG capsule Commonly known as: PRILOSEC Take 1 capsule (20 mg total) by mouth 2 (two) times daily. Open capsule and pour contents into NG tube               Discharge Care Instructions  (From admission, onward)           Start     Ordered   01/01/24 0000  Discharge wound care:       Comments: Wound care per nursing.  Frequent turning, every 2 hours, delirium precautions.   01/01/24 1126            Contact information for follow-up providers      [facility]. Schedule an appointment as soon as possible for a visit in 1 month(s).   Specialty: Neurology Why: stroke clinic Contact information: [address] [place] [address] [phone]             Contact information for after-discharge care     Destination     [facility] Preferred SNF .   Service: Skilled Nursing Contact information: [address] [place] [address] [phone]                    Discharge Exam: Filed Weights   12/28/23 0500 12/29/23 0415 01/01/24 0500  Weight: 66.2 kg 66.6 kg 65.3 kg   S: Alert and awake, no acute complaints, diarrhea improving.  BP (!) 119/97   Pulse (!) 131   Temp 99.1 F (37.3 C) (Oral)   Resp 18   Ht 6' (1.829 m)   Wt 65.3 kg   SpO2 100%   BMI 19.52 kg/m   Physical Exam General: Alert and awake, in NAD, appears comfortable Cardiovascular: S1 S2 clear, RRR.  Respiratory: CTAB, no wheezing Gastrointestinal: Soft, nontender, nondistended, NBS Ext: no pedal edema bilaterally Psych:  mental status improving   Condition at discharge: fair  The results of significant diagnostics from this  hospitalization (including imaging, microbiology, ancillary and laboratory) are listed below for reference.   Imaging Studies: DG Swallowing Func-Speech Pathology Result Date: 12/25/2023 Table formatting from the original result was not included. Modified Barium Swallow Study Patient Details Name: THEODEN MAUCH MRN: [id number] Date of Birth: 1951/03/20 Today's Date: 12/25/2023 HPI/PMH: HPI: Tremell Reimers is a 73 yo male admitted from [facility] 11/29/23 after a fall. PMH: HTN, recurrent falls, malnutrition, DM2, CKD, prostate cancer (2014). Acute left frontal lobe hematoma with multiple bilateral small SAH. CXR concerning for aspiration PNA. PEG placed 3/11 Clinical Impression: Pt scored a DIGEST rating of  2, moderate pharyngeal dysphagia. Oral dysphagia was also present.  He demonstrated fluctuating swallowing, hampered by his cognitive impairments, including poor initiation and difficulty with sustaining his attention during the study. There was frequent oral holding and reduced oral movement/propulsion into the pharynx.  There was inconsistent base-of-tongue and pharyngeal wall contraction, leading to variations in degree of residue.  There was diminished laryngeal mobility, inconsistent epiglottic closure, and intermittent penetration of materials into the laryngeal vestibule.  Mixed consistencies collected in the pyriforms and on two occasions spilled into the airway with no response from the patient.  Pt could not follow commands for compensatory strategies or a cued cough.  There was no single consistency that can be deemed safer than the others. However, with opportunities to swallow, pt may show some gains.  For now, continue ice chips and allow teaspoons of water with supervision.  SLP will follow while in acute care; he will benefit from swallowing tx at next level of care.  DIGEST Swallow Severity Rating*             Safety: 2             Efficiency:2             Overall Pharyngeal Swallow Severity:  2 moderate 1: mild; 2: moderate; 3: severe; 4: profound *The Dynamic Imaging Grade of Swallowing Toxicity is standardized for the head and neck cancer population, however, demonstrates promising clinical applications across populations to standardize the clinical rating of pharyngeal swallow safety and severity.    Factors that may increase risk of adverse event in presence of aspiration [name] & [name] 2021): Factors that may increase risk of adverse event in presence of aspiration [name] & [name] 2021): Reduced cognitive function; Limited mobility; Dependence for feeding and/or oral hygiene; Weak cough; Aspiration of thick, dense, and/or acidic materials Recommendations/Plan: Swallowing Evaluation Recommendations Swallowing Evaluation Recommendations Recommendations: NPO; Ice chips PRN after oral care (teaspoons of water after oral care) Liquid Administration via: Spoon Medication Administration: Via alternative means Supervision: Full assist for feeding Swallowing strategies  : Small bites/sips Oral care recommendations: Oral care QID (4x/day) Caregiver Recommendations: Have oral suction available Treatment Plan Treatment Plan Treatment recommendations: Therapy as outlined in treatment plan below Functional status assessment: Patient has had a recent decline in their functional status and demonstrates the ability to make significant improvements in function in a reasonable and predictable amount of time. Treatment frequency: Min 2x/week Treatment duration: 2 weeks Interventions: Patient/family education; Diet toleration management by SLP Recommendations Recommendations for follow up therapy are one component of a multi-disciplinary discharge planning process, led by the attending physician.  Recommendations may be updated based on patient status, additional functional criteria and insurance authorization. Assessment: Orofacial Exam: Orofacial Exam Oral Cavity: Oral Hygiene: WFL Oral Cavity - Dentition:  Missing dentition Anatomy: Anatomy: Suspected cervical osteophytes Boluses Administered: Boluses Administered Boluses Administered: Thin liquids (Level 0); Mildly thick liquids (Level 2, nectar thick); Moderately thick liquids (Level 3, honey thick); Puree  Oral Impairment Domain: Oral Impairment Domain Lip Closure: Interlabial escape, no progression to anterior lip Tongue control during bolus hold: Not tested Bolus preparation/mastication: Slow prolonged chewing/mashing with complete recollection Bolus transport/lingual motion: Repetitive/disorganized tongue motion Oral residue: Residue collection on oral structures Location of oral residue : Tongue; Floor of mouth Initiation of pharyngeal swallow : Pyriform sinuses  Pharyngeal Impairment Domain: Pharyngeal Impairment Domain Soft palate elevation: No bolus between soft palate (SP)/pharyngeal wall (PW) Laryngeal elevation: Partial superior movement of thyroid cartilage/partial approximation of arytenoids  to epiglottic petiole Anterior hyoid excursion: Partial anterior movement Epiglottic movement: Partial inversion Laryngeal vestibule closure: Incomplete, narrow column air/contrast in laryngeal vestibule Pharyngeal stripping wave : Present - diminished Pharyngoesophageal segment opening: Partial distention/partial duration, partial obstruction of flow Tongue base retraction: Narrow column of contrast or air between tongue base and PPW Pharyngeal residue: Collection of residue within or on pharyngeal structures Location of pharyngeal residue: Valleculae; Pharyngeal wall; Pyriform sinuses  Esophageal Impairment Domain: Esophageal Impairment Domain Esophageal clearance upright position: Esophageal retention (minimal) Pill: Pill Consistency administered: -- (NT) Penetration/Aspiration Scale Score: Penetration/Aspiration Scale Score 8.  Material enters airway, passes BELOW cords without attempt by patient to eject out (silent aspiration) : Thin liquids (Level 0); Mildly  thick liquids (Level 2, nectar thick); Moderately thick liquids (Level 3, honey thick); Puree Compensatory Strategies: Compensatory Strategies Compensatory strategies: Yes Straw: Ineffective   General Information: Caregiver present: No  Diet Prior to this Study: NPO   No data recorded  No data recorded  No data recorded  No data recorded Behavior/Cognition: Alert; Doesn't follow directions Self-Feeding Abilities: Dependent for feeding Baseline vocal quality/speech: Normal Volitional Cough: Unable to elicit Volitional Swallow: Unable to elicit Exam Limitations: Poor positioning (head extension and turning during the study) Goal Planning: Prognosis for improved oropharyngeal function: Fair No data recorded No data recorded Patient/Family Stated Goal: unable to state No data recorded Pain: Pain Assessment Pain Assessment: Faces Faces Pain Scale: 0 Breathing: 0 Negative Vocalization: 0 Facial Expression: 0 Body Language: 0 Consolability: 0 PAINAD Score: 0 End of Session: Start Time:SLP Start Time (ACUTE ONLY): 1032 Stop Time: SLP Stop Time (ACUTE ONLY): 1101 Time Calculation:SLP Time Calculation (min) (ACUTE ONLY): 29 min Charges: SLP Evaluations $ SLP Speech Visit: 1 Visit SLP Evaluations $Swallowing Treatment: 1 Procedure SLP visit diagnosis: SLP Visit Diagnosis: Dysphagia, oropharyngeal phase (R13.12) Past Medical History: Past Medical History: Diagnosis Date  Anxiety   new dx  Chronic kidney disease   Diabetes mellitus   type ii  History of radiation therapy 09/29/13- 11/25/13  prostate 7800 cGy 40 sessions, seminal vesicles 5600 cGy 40 sessions  Hypertension   Prostate cancer (HCC) 06/01/2013  gleason 7 Past Surgical History: Past Surgical History: Procedure Laterality Date  CARDIAC CATHETERIZATION  01/01/2013  FRACTURE SURGERY    left foot   HIP ARTHROPLASTY Left 11/16/2023  Procedure: ARTHROPLASTY BIPOLAR HIP (HEMIARTHROPLASTY);  Surgeon: Sheral Apley, MD;  [facility]: [facility];  Service: Orthopedics;  Laterality:  Left;  IR GASTROSTOMY TUBE MOD SED  12/23/2023  LEFT HEART CATHETERIZATION WITH CORONARY ANGIOGRAM N/A 01/01/2013  Procedure: LEFT HEART CATHETERIZATION WITH CORONARY ANGIOGRAM;  Surgeon: Robynn Pane, MD;  Location: [facility] CATH LAB;  Service: Cardiovascular;  Laterality: N/A;  PROSTATE BIOPSY  06/01/13 [name] 12/25/2023, 3:34 PM Amanda L. [name], MA CCC/SLP Clinical Specialist - Acute Care SLP Acute Rehabilitation Services Office number [phone]  DG CHEST PORT 1 VIEW Result Date: 12/24/2023 CLINICAL DATA:  Shortness of breath. EXAM: PORTABLE CHEST 1 VIEW COMPARISON:  12/23/2023 FINDINGS: Enteric tube has been removed. Stable lung volumes. Stable heart size and mediastinal contours. Mild peribronchial thickening with mild atelectasis in the lung bases. No pneumothorax or pleural effusion. 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 IMPRESSION: Mild peribronchial thickening with mild atelectasis in the lung bases. Electronically Signed   By: Narda Rutherford M.D.   On: 12/24/2023 10:30   IR GASTROSTOMY TUBE MOD SED Result Date: 12/23/2023 INDICATION: Subarachnoid hemorrhage, dysphagia EXAM: FLUOROSCOPIC 20 FRENCH PULL-THROUGH GASTROSTOMY Date:  12/23/2023 12/23/2023 2:48  pm Radiologist:  M. Ruel Favors, MD Guidance:  Fluoroscopic MEDICATIONS: Ancef 2 g; Antibiotics were administered within 1 hour of the procedure. Glucagon 0.5 mg IV ANESTHESIA/SEDATION: Versed 2.0 mg IV; Fentanyl 100 mcg IV Moderate Sedation Time:  11 minutes The patient was continuously monitored during the procedure by the interventional radiology nurse under my direct supervision. CONTRAST:  20mL OMNIPAQUE IOHEXOL 300 MG/ML SOLN - administered into the gastric lumen. FLUOROSCOPY: Fluoroscopy Time: 6 minutes 0 seconds (100 mGy). COMPLICATIONS: None immediate. PROCEDURE: Informed consent was obtained from the patient following explanation of the procedure, risks, benefits and alternatives. The patient understands, agrees  and consents for the procedure. All questions were addressed. A time out was performed. Maximal barrier sterile technique utilized including caps, mask, sterile gowns, sterile gloves, large sterile drape, hand hygiene, and betadine prep. The left upper quadrant was sterilely prepped and draped. An oral gastric catheter was inserted into the stomach under fluoroscopy. The existing nasogastric feeding tube was removed. Air was injected into the stomach for insufflation and visualization under fluoroscopy. The air distended stomach was confirmed beneath the anterior abdominal wall in the frontal and lateral projections. Under sterile conditions and local anesthesia, a 17 gauge trocar needle was utilized to access the stomach percutaneously beneath the left subcostal margin. Needle position was confirmed within the stomach under biplane fluoroscopy. Contrast injection confirmed position also. A single T tack was deployed for gastropexy. Over an Amplatz guide wire, a 9-French sheath was inserted into the stomach. A snare device was utilized to capture the oral gastric catheter. The snare device was pulled retrograde from the stomach up the esophagus and out the oropharynx. The 20-French pull-through gastrostomy was connected to the snare device and pulled antegrade through the oropharynx down the esophagus into the stomach and then through the percutaneous tract external to the patient. The gastrostomy was assembled externally. Contrast injection confirms position in the stomach. Images were obtained for documentation. The patient tolerated procedure well. No immediate complication. IMPRESSION: Fluoroscopic insertion of a 20-French "pull-through" gastrostomy. Electronically Signed   By: Judie Petit.  Shick M.D.   On: 12/23/2023 14:58   DG CHEST PORT 1 VIEW Result Date: 12/23/2023 CLINICAL DATA:  Shortness of breath EXAM: PORTABLE CHEST 1 VIEW COMPARISON:  Film from the previous day. FINDINGS: Cardiac shadow is within normal  limits. The lungs are clear bilaterally. Feeding catheter extends into the stomach. IMPRESSION: No active disease. Electronically Signed   By: Alcide Clever M.D.   On: 12/23/2023 11:01   DG Abd Portable 1V Result Date: 12/23/2023 CLINICAL DATA:  Check colonic contrast EXAM: PORTABLE ABDOMEN - 1 VIEW COMPARISON:  12/18/2023 FINDINGS: Contrast is noted throughout the colon. Feeding catheter is noted within the stomach. Some partial overlap of the stomach with the transverse colon is noted similar to that seen on prior CT. IMPRESSION: Transverse colon and gastric lumen in close proximity. Electronically Signed   By: Alcide Clever M.D.   On: 12/23/2023 11:00   DG CHEST PORT 1 VIEW Addendum Date: 12/22/2023 ADDENDUM REPORT: 12/22/2023 08:57 ADDENDUM: These results were called by telephone at the time of interpretation on 12/22/2023 at 8:56 am to provider [facility] , who verbally acknowledged these results. Electronically Signed   By: Signa Kell M.D.   On: 12/22/2023 08:57   Result Date: 12/22/2023 CLINICAL DATA:  Shortness of breath EXAM: PORTABLE CHEST 1 VIEW COMPARISON:  12/21/2023 FINDINGS: The enteric tube tip is now within the mid mediastinum in the projection of the distal left mainstem  bronchus. This is similar in position to the previous exam. Heart size is normal. Aortic atherosclerotic calcifications. No pleural fluid, interstitial edema or airspace disease. Scar versus atelectasis within the periphery of the left lower lobe. IMPRESSION: 1. Enteric tube tip is now within the mid mediastinum in the projection of the distal left mainstem bronchus. This is similar in position to the previous exam. Recommend repositioning. 2. No acute cardiopulmonary abnormalities. Electronically Signed: By: Signa Kell M.D. On: 12/22/2023 08:49   DG CHEST PORT 1 VIEW Result Date: 12/21/2023 CLINICAL DATA:  Shortness of breath EXAM: PORTABLE CHEST 1 VIEW COMPARISON:  X-ray 12/14/2023 FINDINGS: Feeding tube is seen  overlying the mid mediastinum. This has been retracted. No pneumothorax, effusion or edema. Normal cardiopericardial silhouette. Calcified aorta. Improving lung base opacity. Overlapping cardiac leads. IMPRESSION: Retraction of feeding tube now with tip along the mid mediastinum. Critical Value/emergent results were called by telephone at the time of interpretation on 12/21/2023 at 9:38 am to provider [facility] , who verbally acknowledged these results. Electronically Signed   By: Karen Kays M.D.   On: 12/21/2023 09:44   CT ABDOMEN WO CONTRAST Result Date: 12/19/2023 CLINICAL DATA:  dysphagia EXAM: CT ABDOMEN WITHOUT CONTRAST TECHNIQUE: Multidetector CT imaging of the abdomen was performed following the standard protocol without IV contrast. RADIATION DOSE REDUCTION: This exam was performed according to the departmental dose-optimization program which includes automated exposure control, adjustment of the mA and/or kV according to patient size and/or use of iterative reconstruction technique. COMPARISON:  CT AP, 06/29/2013.  CHEST XR 12/14/2018. FINDINGS: Lower chest: Calcified coronary atherosclerosis. Minimal patchy opacities of the imaged lung bases, greater on the RIGHT. Hepatobiliary: Normal noncontrast appearance of the liver without focal abnormality. No gallstones, gallbladder wall thickening, or biliary dilatation. Pancreas: No pancreatic ductal dilatation or surrounding inflammatory changes. Spleen: Normal in size without focal abnormality. Small infrahilar accessory spleen. Adrenals/Urinary Tract: Adrenal glands are unremarkable. Small, sub-1 cm rounded renal cortical hyperdensities measuring 60 HU, incompletely characterized though likely to represent small proteinaceous cysts. Kidneys are otherwise normal, without renal calculi or hydronephrosis. Stomach/Bowel: Stomach is within normal limits. Imaged portions of the small bowel and colon are nondilated. Appendix is outside the field of view. No  evidence of bowel wall thickening, distention, or inflammatory changes. Vascular/Lymphatic: Aortic atherosclerosis. No enlarged abdominal lymph nodes. Other: No abdominal wall hernia or ascites. Musculoskeletal: Chronic-appearing L2 compression deformity with 50% loss of height. No acute osseous findings. IMPRESSION: 1. No acute abdominal process. 2. Mild prominence of the LEFT hepatic lobe and interposition of distal transverse and splenic flexure of colon. Anatomy is equivocal for percutaneous gastrostomy, with a trial contrast opacification of colon and insufflation recommended. 3. Chronic L2 compression deformity and Aortic Atherosclerosis (ICD10-I70.0). Additional incidental, chronic and senescent findings as above. Roanna Banning, MD [facility] Electronically Signed   By: Roanna Banning M.D.   On: 12/19/2023 07:00   DG CHEST PORT 1 VIEW Result Date: 12/14/2023 CLINICAL DATA:  Fever EXAM: PORTABLE CHEST 1 VIEW COMPARISON:  12/09/2023 FINDINGS: Frontal view of the chest demonstrates enteric catheter passing below diaphragm tip projecting over the gastric fundus. Interval development of right basilar airspace disease, which could reflect aspiration or infection. No effusion or pneumothorax. No acute bony abnormalities. IMPRESSION: 1. New right basilar airspace disease, which could reflect sequela of aspiration or infection. Electronically Signed   By: Sharlet Salina M.D.   On: 12/14/2023 13:09   CT HEAD WO CONTRAST ( ) Result  Date: 12/12/2023 CLINICAL DATA:  Sudden hiccups in the setting of ICH, evaluate for worsening bleed EXAM: CT HEAD WITHOUT CONTRAST TECHNIQUE: Contiguous axial images were obtained from the base of the skull through the vertex without intravenous contrast. RADIATION DOSE REDUCTION: This exam was performed according to the departmental dose-optimization program which includes automated exposure control, adjustment of the mA and/or  kV according to patient size and/or use of iterative reconstruction technique. COMPARISON:  12/04/2023 FINDINGS: Brain: The patient's large left frontal hematoma has hazy margination, expected evolution. Regional edema is mildly increased, also expected. The hemorrhagic area and lobulated shape is unchanged, hemorrhagic area measuring up to 7.7 cm anterior to posterior and 4.3 cm craniocaudal. Similar degree of local mass effect and anterior midline shift measuring 7 mm at the anterior septum pellucidum. Layering blood clot in the lateral ventricles. Subarachnoid hemorrhage scattered along the convexities without interval increase. 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 No hydrocephalus or evidence of acute infarct. Vascular: No hyperdense vessel or unexpected calcification. Skull: Normal. Negative for fracture or focal lesion. Sinuses/Orbits: No acute finding. IMPRESSION: Expected evolution of large left frontal hematoma with intraventricular and subarachnoid extension. Anterior midline shift is similar to prior at 7 mm. No new abnormality. Electronically Signed   By: Tiburcio Pea M.D.   On: 12/12/2023 05:14   DG CHEST PORT 1 VIEW Result Date: 12/09/2023 CLINICAL DATA:  Fever EXAM: PORTABLE CHEST 1 VIEW COMPARISON:  12/05/2023 FINDINGS: Two frontal views of the chest and upper abdomen are obtained. Enteric catheter passes below diaphragm tip projecting over the gastric fundus. Cardiac silhouette is unremarkable. No airspace disease, effusion, or pneumothorax. No acute bony abnormalities. IMPRESSION: 1. Enteric catheter tip projecting over gastric fundus. 2. No acute intrathoracic process. Electronically Signed   By: Sharlet Salina M.D.   On: 12/09/2023 15:49   DG Abd Portable 1V Result Date: 12/06/2023 CLINICAL DATA:  Nasogastric tube placement. EXAM: PORTABLE ABDOMEN - 1 VIEW COMPARISON:  December 03, 2023. FINDINGS: Distal tip of feeding tube is seen in expected position of  proximal stomach. IMPRESSION: Distal tip of feeding tube is seen in expected position of proximal stomach. Electronically Signed   By: Lupita Raider M.D.   On: 12/06/2023 09:19   DG CHEST PORT 1 VIEW Result Date: 12/05/2023 CLINICAL DATA:  Fever EXAM: PORTABLE CHEST 1 VIEW COMPARISON:  11/29/2023 FINDINGS: Enteric tube tip at the distal esophagus. Lung fields are clear. Cardiac size is normal. Aortic atherosclerosis. IMPRESSION: No active disease. Enteric tube tip at the distal esophagus, recommend advancement for more optimal positioning. Electronically Signed   By: Jasmine Pang M.D.   On: 12/05/2023 21:13   MR BRAIN W WO CONTRAST Result Date: 12/05/2023 CLINICAL DATA:  Hemorrhagic stroke EXAM: MRI HEAD WITHOUT AND WITH CONTRAST TECHNIQUE: Multiplanar, multiecho pulse sequences of the brain and surrounding structures were obtained without and with intravenous contrast. CONTRAST:  6mL GADAVIST GADOBUTROL 1 MMOL/ML IV SOLN COMPARISON:  Head CT 12/04/2023 FINDINGS: Brain: Unchanged size of large left frontal intraparenchymal hematoma. Blood layering within both lateral ventricles. Subarachnoid blood over both hemispheres. There is multifocal hyperintense T2-weighted signal within the white matter. Generalized volume loss. There is 4 mm of anterior rightward midline shift. The midline structures are normal. There is no abnormal contrast enhancement. Vascular: Normal flow voids. Skull and upper cervical spine: Normal calvarium and skull base. Visualized upper cervical spine and soft tissues are normal. Sinuses/Orbits:No paranasal sinus fluid levels or advanced mucosal thickening. No mastoid or middle ear  effusion. Normal orbits. IMPRESSION: 1. Unchanged size of large left frontal intraparenchymal hematoma with 4 mm of anterior rightward midline shift. 2. Blood layering within both lateral ventricles. 3. Subarachnoid blood over both hemispheres. Electronically Signed   By: Deatra Robinson M.D.   On: 12/05/2023  19:24   CT HEAD WO CONTRAST ( ) Result Date: 12/04/2023 CLINICAL DATA:  73 year old male with multifocal intracranial hemorrhage after a fall. Subsequent encounter. EXAM: CT HEAD WITHOUT CONTRAST TECHNIQUE: Contiguous axial images were obtained from the base of the skull through the vertex without intravenous contrast. RADIATION DOSE REDUCTION: This exam was performed according to the departmental dose-optimization program which includes automated exposure control, adjustment of the mA and/or kV according to patient size and/or use of iterative reconstruction technique. COMPARISON:  12/02/2023 and earlier. FINDINGS: Brain: Left anterior frontal lobe intra-axial hemorrhage remains hyperdense and amorphous. Due to scan angulation the most direct comparison now is on sagittal images (series 6, image 39 today versus the same series and image 2 days ago), where hemorrhage size and configuration appear unchanged. Regional edema and mass effect also unchanged. Scattered bilateral subarachnoid blood has not significantly changed. Small volume layering intraventricular hemorrhage has not significantly changed. Stable ventricle size and configuration, no ventriculomegaly. Stable mild rightward midline shift limited to the anterior septum pellucidum. Basilar cisterns are stable. Stable gray-white matter differentiation throughout the brain. Left frontal lobe edema surrounding the hematoma and chronic right PCA territory infarct with encephalomalacia. Vascular: No suspicious intracranial vascular hyperdensity. Skull: Intact, stable. Incidental large maxillary torus exostoses incidentally noted. Sinuses/Orbits: Left nasoenteric tube now in place. Visualized paranasal sinuses and mastoids are stable and well aerated. Other: Stable orbit and scalp soft tissues. IMPRESSION: 1. Continued stability of large anterior left frontal lobar hemorrhage, associated edema. Relatively mild intracranial mass effect, scattered small volume  bilateral SAH, and IVH remain stable. 2. No new intracranial abnormality. Chronic right PCA territory infarct. Electronically Signed   By: Odessa Fleming M.D.   On: 12/04/2023 06:37   EEG adult Result Date: 12/03/2023 Charlsie Quest, MD     12/03/2023  4:09 PM Patient Name: William Todd MRN: [id number] Epilepsy Attending: Charlsie Quest Referring Physician/Provider: Elmer Picker, NP Date: 12/03/2023 Duration: 23.45 mins Patient history: 73yo M with left frontal ICH. EEG to evaluate for seizure Level of alertness: Awake, asleep AEDs during EEG study: LEV Technical aspects: This EEG study was done with scalp electrodes positioned according to the 10-20 International system of electrode placement. Electrical activity was reviewed with band pass filter of 1-70Hz , sensitivity of 7 uV/mm, display speed of 22mm/sec with a 60Hz  notched filter applied as appropriate. EEG data were recorded continuously and digitally stored.  Video monitoring was available and reviewed as appropriate. Description: No clear posterior dominant rhythm was seen. Sleep was characterized by sleep spindles (12 to 14 Hz), maximal frontocentral region. EEG showed continuous generalized and maximal left frontal 3 to 6 Hz theta-delta slowing. Sharp waves were noted in left frontal region,at times periodic at 1hz . Hyperventilation and photic stimulation were not performed.   ABNORMALITY - Sharp wave, left frontal region - Continuous slow, generalized and maximal left frontal region IMPRESSION: This study showed evidence of epileptogenicity and cortical dysfunction arising from left frontal region likely secondary to underlying ICH. Additionally there is moderate diffuse encephalopathy. No seizures were seen throughout the recording. If suspicion for ictal activity remains a concern, a prolonged study can be considered. Charlsie Quest   DG Abd Portable 1V Result Date: 12/03/2023 CLINICAL  DATA:  73 year old male feeding tube placement. EXAM:  PORTABLE ABDOMEN - 1 VIEW COMPARISON:  12/01/2023 and earlier. FINDINGS: Portable AP semi upright view at 1038 hours. Enteric feeding tube is in place within the proximal stomach, looped at the level of the gastric fundus. Negative lung bases. Nonobstructed bowel gas pattern. No acute osseous abnormality identified. IMPRESSION: Enteric feeding tube placed into the proximal stomach. Electronically Signed   By: Odessa Fleming M.D.   On: 12/03/2023 11:19    Microbiology: Results for orders placed or performed during the hospital encounter of 11/29/23  MRSA Next Gen by PCR, Nasal     Status: None   Collection Time: 11/30/23 11:19 AM   Specimen: Nasal Mucosa; Nasal Swab  Result Value Ref Range Status   MRSA by PCR Next Gen NOT DETECTED NOT DETECTED Final    Comment: (NOTE) The GeneXpert MRSA Assay (FDA approved for NASAL specimens only), is one component of a comprehensive MRSA colonization surveillance program. It is not intended to diagnose MRSA infection nor to guide or monitor treatment for MRSA infections. Test performance is not FDA approved in patients less than 15 years old. Performed at [facility], [address], [place], [place] [address]   Culture, blood (single) w Reflex to ID Panel     Status: None   Collection Time: 12/14/23  9:48 PM   Specimen: BLOOD LEFT ARM  Result Value Ref Range Status   Specimen Description BLOOD LEFT ARM  Final   Special Requests   Final    BOTTLES DRAWN AEROBIC AND ANAEROBIC Blood Culture results may not be optimal due to an inadequate volume of blood received in culture bottles   Culture   Final    NO GROWTH 5 DAYS Performed at [facility], [address], [place], [place] [address]    Report Status 12/19/2023 FINAL  Final  Resp panel by RT-PCR (RSV, Flu A&B, Covid) Anterior Nasal Swab     Status: None   Collection Time: 12/15/23  8:07 AM   Specimen: Anterior Nasal Swab  Result Value Ref Range Status   SARS Coronavirus 2 by RT PCR  NEGATIVE NEGATIVE Final   Influenza A by PCR NEGATIVE NEGATIVE Final   Influenza B by PCR NEGATIVE NEGATIVE Final    Comment: (NOTE) The Xpert Xpress SARS-CoV-2/FLU/RSV plus assay is intended as an aid in the diagnosis of influenza from Nasopharyngeal swab specimens and should not be used as a sole basis for treatment. Nasal washings and aspirates are unacceptable for Xpert Xpress SARS-CoV-2/FLU/RSV testing.  Fact Sheet for Patients: BloggerCourse.com  Fact Sheet for Healthcare Providers: SeriousBroker.it  This test is not yet approved or cleared by the [place] FDA and has been authorized for detection and/or diagnosis of SARS-CoV-2 by FDA under an Emergency Use Authorization (EUA). This EUA will remain in effect (meaning this test can be used) for the duration of the COVID-19 declaration under Section 564(b)(1) of the Act, 21 U.S.C. section 360bbb-3(b)(1), unless the authorization is terminated or revoked.     Resp Syncytial Virus by PCR NEGATIVE NEGATIVE Final    Comment: (NOTE) Fact Sheet for Patients: BloggerCourse.com  Fact Sheet for Healthcare Providers: SeriousBroker.it  This test is not yet approved or cleared by the [place] FDA and has been authorized for detection and/or diagnosis of SARS-CoV-2 by FDA under an Emergency Use Authorization (EUA). This EUA will remain in effect (meaning this test can be used) for the duration of the COVID-19 declaration under  Section 564(b)(1) of the Act, 21 U.S.C. section 360bbb-3(b)(1), unless the authorization is terminated or revoked.  Performed at [facility], [address], [place], [place] [address]     Labs: CBC: Recent Labs  Lab 12/26/23 [phone] 12/27/23 0606 12/28/23 0646 12/29/23 0205 12/30/23 0503  WBC 11.4* 10.8* 11.1* 11.3* 10.2  HGB 7.7* 7.3* 7.6* 7.8* 7.6*  HCT 25.2* 24.4* 24.9* 25.6* 24.5*   MCV 98.4 100.0 96.9 98.5 96.8  PLT 164 151 150 158 157   Basic Metabolic Panel: Recent Labs  Lab 12/26/23 0723 12/27/23 0606 12/28/23 0646 12/29/23 0205  NA 147* 142 140 140  K 3.9 4.2 4.5 4.1  CL 109 107 107 106  CO2 27 22 26 24   GLUCOSE 220* 146* 146* 141*  BUN 21 24* 26* 26*  CREATININE 1.25* 1.24 1.41* 1.33*  CALCIUM 8.4* 8.2* 8.3* 8.4*  MG  --   --   --  2.0  PHOS 2.0* 2.7 2.9 3.4   Liver Function Tests: Recent Labs  Lab 12/26/23 0723 12/27/23 0606 12/28/23 0646 12/29/23 0205  ALBUMIN 1.8* 1.8* 2.0* 2.0*   CBG: Recent Labs  Lab 12/31/23 2021 01/01/24 0030 01/01/24 0351 01/01/24 0806 01/01/24 1254  GLUCAP 130* 186* 153* 119* 204*    Discharge time spent: greater than 30 minutes.  Signed: Thad Ranger, MD [facility] 01/01/2024

## 2024-01-01 NOTE — TOC Transition Note (Signed)
 Transition of Care [facility]) - Discharge Note   Patient Details  Name: William Todd MRN: [id number] Date of Birth: 1951/08/09  [facility] CM/SW Contact:  William Todd, Student-Social Work Phone Number: 01/01/2024, 12:18 PM   Clinical Narrative:   MSW Student received insurance approval for patient to admit to SNF. MSW Student confirmed with MD that patient is stable for discharge. MSW Student notified William Todd and they are in agreement with discharge. MSW Student confirmed bed is available at [facility]. Transport arranged with [facility] for next available.   Number to call report [phone] RM: [phone]     Final next level of care: Skilled Nursing Facility Barriers to Discharge: No Barriers Identified   Patient Goals and CMS Choice Patient states their goals for this hospitalization and ongoing recovery are:: UTA CMS Medicare.gov Compare Post Acute Care list provided to:: Patient Represenative (must comment) Choice offered to / list presented to : Spouse William Todd ownership interest in [facility].provided to:: Spouse    Discharge Placement              Patient chooses bed at:  ([facility]) Patient to be transferred to facility by: William Todd Name of family member notified: William Todd Patient and family notified of of transfer: 01/01/24  Discharge Plan and Services Additional resources added to the After Visit Summary for   In-house Referral: Clinical Social Work, Hospice / Palliative Care                                   Social Drivers of Health (SDOH) Interventions SDOH Screenings   Food Insecurity: No Food Insecurity (11/30/2023)  Housing: Low Risk  (11/30/2023)  Transportation Needs: No Transportation Needs (11/30/2023)  Utilities: Not At Risk (11/30/2023)  Social Connections: Moderately Integrated (11/30/2023)  Tobacco Use: Low Risk  (11/30/2023)     Readmission Risk Interventions    12/08/2023   12:40 PM  Readmission Risk Prevention Plan   Transportation Screening Complete  Medication Review (RN Care Manager) Complete  PCP or Specialist appointment within 3-5 days of discharge Complete  HRI or Home Care Consult Complete  SW Recovery Care/Counseling Consult Complete  Palliative Care Screening Complete  Skilled Nursing Facility Complete

## 2024-01-06 ENCOUNTER — Emergency Department (HOSPITAL_COMMUNITY)

## 2024-01-06 ENCOUNTER — Inpatient Hospital Stay (HOSPITAL_COMMUNITY)
Admission: EM | Admit: 2024-01-06 | Discharge: 2024-02-12 | DRG: 871 | Disposition: E | Attending: Internal Medicine | Admitting: Internal Medicine

## 2024-01-06 ENCOUNTER — Encounter (HOSPITAL_COMMUNITY): Payer: Self-pay

## 2024-01-06 ENCOUNTER — Other Ambulatory Visit: Payer: Self-pay

## 2024-01-06 DIAGNOSIS — Z8679 Personal history of other diseases of the circulatory system: Secondary | ICD-10-CM

## 2024-01-06 DIAGNOSIS — Z96642 Presence of left artificial hip joint: Secondary | ICD-10-CM | POA: Diagnosis present

## 2024-01-06 DIAGNOSIS — E875 Hyperkalemia: Secondary | ICD-10-CM | POA: Diagnosis present

## 2024-01-06 DIAGNOSIS — Z7189 Other specified counseling: Secondary | ICD-10-CM

## 2024-01-06 DIAGNOSIS — L89150 Pressure ulcer of sacral region, unstageable: Secondary | ICD-10-CM | POA: Diagnosis present

## 2024-01-06 DIAGNOSIS — R54 Age-related physical debility: Secondary | ICD-10-CM | POA: Diagnosis present

## 2024-01-06 DIAGNOSIS — R509 Fever, unspecified: Secondary | ICD-10-CM

## 2024-01-06 DIAGNOSIS — G252 Other specified forms of tremor: Secondary | ICD-10-CM | POA: Diagnosis present

## 2024-01-06 DIAGNOSIS — Z515 Encounter for palliative care: Secondary | ICD-10-CM

## 2024-01-06 DIAGNOSIS — F0390 Unspecified dementia without behavioral disturbance: Secondary | ICD-10-CM | POA: Diagnosis present

## 2024-01-06 DIAGNOSIS — J690 Pneumonitis due to inhalation of food and vomit: Secondary | ICD-10-CM | POA: Diagnosis present

## 2024-01-06 DIAGNOSIS — E1122 Type 2 diabetes mellitus with diabetic chronic kidney disease: Secondary | ICD-10-CM | POA: Diagnosis present

## 2024-01-06 DIAGNOSIS — A419 Sepsis, unspecified organism: Principal | ICD-10-CM | POA: Diagnosis present

## 2024-01-06 DIAGNOSIS — H1033 Unspecified acute conjunctivitis, bilateral: Secondary | ICD-10-CM | POA: Diagnosis present

## 2024-01-06 DIAGNOSIS — L8990 Pressure ulcer of unspecified site, unspecified stage: Secondary | ICD-10-CM

## 2024-01-06 DIAGNOSIS — Z681 Body mass index (BMI) 19 or less, adult: Secondary | ICD-10-CM

## 2024-01-06 DIAGNOSIS — G9341 Metabolic encephalopathy: Secondary | ICD-10-CM | POA: Diagnosis present

## 2024-01-06 DIAGNOSIS — Z79899 Other long term (current) drug therapy: Secondary | ICD-10-CM

## 2024-01-06 DIAGNOSIS — Z8042 Family history of malignant neoplasm of prostate: Secondary | ICD-10-CM

## 2024-01-06 DIAGNOSIS — Z66 Do not resuscitate: Secondary | ICD-10-CM | POA: Diagnosis present

## 2024-01-06 DIAGNOSIS — E119 Type 2 diabetes mellitus without complications: Secondary | ICD-10-CM

## 2024-01-06 DIAGNOSIS — R1312 Dysphagia, oropharyngeal phase: Secondary | ICD-10-CM | POA: Diagnosis present

## 2024-01-06 DIAGNOSIS — R6510 Systemic inflammatory response syndrome (SIRS) of non-infectious origin without acute organ dysfunction: Principal | ICD-10-CM

## 2024-01-06 DIAGNOSIS — R251 Tremor, unspecified: Secondary | ICD-10-CM

## 2024-01-06 DIAGNOSIS — L89896 Pressure-induced deep tissue damage of other site: Secondary | ICD-10-CM | POA: Diagnosis present

## 2024-01-06 DIAGNOSIS — J9601 Acute respiratory failure with hypoxia: Secondary | ICD-10-CM | POA: Diagnosis not present

## 2024-01-06 DIAGNOSIS — Z1152 Encounter for screening for COVID-19: Secondary | ICD-10-CM

## 2024-01-06 DIAGNOSIS — I447 Left bundle-branch block, unspecified: Secondary | ICD-10-CM | POA: Diagnosis present

## 2024-01-06 DIAGNOSIS — E43 Unspecified severe protein-calorie malnutrition: Secondary | ICD-10-CM | POA: Diagnosis present

## 2024-01-06 DIAGNOSIS — E872 Acidosis, unspecified: Secondary | ICD-10-CM | POA: Diagnosis present

## 2024-01-06 DIAGNOSIS — E785 Hyperlipidemia, unspecified: Secondary | ICD-10-CM | POA: Diagnosis present

## 2024-01-06 DIAGNOSIS — N1831 Chronic kidney disease, stage 3a: Secondary | ICD-10-CM | POA: Diagnosis present

## 2024-01-06 DIAGNOSIS — E870 Hyperosmolality and hypernatremia: Secondary | ICD-10-CM | POA: Diagnosis present

## 2024-01-06 DIAGNOSIS — Z794 Long term (current) use of insulin: Secondary | ICD-10-CM

## 2024-01-06 DIAGNOSIS — I10 Essential (primary) hypertension: Secondary | ICD-10-CM | POA: Diagnosis present

## 2024-01-06 DIAGNOSIS — I129 Hypertensive chronic kidney disease with stage 1 through stage 4 chronic kidney disease, or unspecified chronic kidney disease: Secondary | ICD-10-CM | POA: Diagnosis present

## 2024-01-06 DIAGNOSIS — L89626 Pressure-induced deep tissue damage of left heel: Secondary | ICD-10-CM | POA: Diagnosis present

## 2024-01-06 DIAGNOSIS — E871 Hypo-osmolality and hyponatremia: Secondary | ICD-10-CM | POA: Diagnosis present

## 2024-01-06 DIAGNOSIS — I69091 Dysphagia following nontraumatic subarachnoid hemorrhage: Secondary | ICD-10-CM

## 2024-01-06 DIAGNOSIS — Z931 Gastrostomy status: Secondary | ICD-10-CM

## 2024-01-06 DIAGNOSIS — Z7984 Long term (current) use of oral hypoglycemic drugs: Secondary | ICD-10-CM

## 2024-01-06 DIAGNOSIS — Z923 Personal history of irradiation: Secondary | ICD-10-CM

## 2024-01-06 DIAGNOSIS — N179 Acute kidney failure, unspecified: Secondary | ICD-10-CM | POA: Diagnosis not present

## 2024-01-06 DIAGNOSIS — D6489 Other specified anemias: Secondary | ICD-10-CM | POA: Diagnosis present

## 2024-01-06 DIAGNOSIS — F419 Anxiety disorder, unspecified: Secondary | ICD-10-CM | POA: Diagnosis present

## 2024-01-06 DIAGNOSIS — Z8546 Personal history of malignant neoplasm of prostate: Secondary | ICD-10-CM

## 2024-01-06 DIAGNOSIS — L89222 Pressure ulcer of left hip, stage 2: Secondary | ICD-10-CM | POA: Diagnosis present

## 2024-01-06 LAB — URINALYSIS, ROUTINE W REFLEX MICROSCOPIC
Bilirubin Urine: NEGATIVE
Glucose, UA: >=500 mg/dL — AB
Ketones, ur: NEGATIVE mg/dL
Leukocytes,Ua: NEGATIVE
Nitrite: NEGATIVE
Protein, ur: 30 mg/dL — AB
Specific Gravity, Urine: 1.01 (ref 1.005–1.030)
pH: 5 (ref 5.0–8.0)

## 2024-01-06 LAB — CBC WITH DIFFERENTIAL/PLATELET
Abs Immature Granulocytes: 0.09 10*3/uL — ABNORMAL HIGH (ref 0.00–0.07)
Basophils Absolute: 0 10*3/uL (ref 0.0–0.1)
Basophils Relative: 0 %
Eosinophils Absolute: 0 10*3/uL (ref 0.0–0.5)
Eosinophils Relative: 0 %
HCT: 26.4 % — ABNORMAL LOW (ref 39.0–52.0)
Hemoglobin: 7.9 g/dL — ABNORMAL LOW (ref 13.0–17.0)
Immature Granulocytes: 1 %
Lymphocytes Relative: 4 %
Lymphs Abs: 0.5 10*3/uL — ABNORMAL LOW (ref 0.7–4.0)
MCH: 29.3 pg (ref 26.0–34.0)
MCHC: 29.9 g/dL — ABNORMAL LOW (ref 30.0–36.0)
MCV: 97.8 fL (ref 80.0–100.0)
Monocytes Absolute: 0.9 10*3/uL (ref 0.1–1.0)
Monocytes Relative: 7 %
Neutro Abs: 12 10*3/uL — ABNORMAL HIGH (ref 1.7–7.7)
Neutrophils Relative %: 88 %
Platelets: 239 10*3/uL (ref 150–400)
RBC: 2.7 MIL/uL — ABNORMAL LOW (ref 4.22–5.81)
RDW: 16.3 % — ABNORMAL HIGH (ref 11.5–15.5)
WBC: 13.5 10*3/uL — ABNORMAL HIGH (ref 4.0–10.5)
nRBC: 0 % (ref 0.0–0.2)

## 2024-01-06 LAB — AMMONIA: Ammonia: 16 umol/L (ref 9–35)

## 2024-01-06 LAB — COMPREHENSIVE METABOLIC PANEL
ALT: 90 U/L — ABNORMAL HIGH (ref 0–44)
AST: 44 U/L — ABNORMAL HIGH (ref 15–41)
Albumin: 2.5 g/dL — ABNORMAL LOW (ref 3.5–5.0)
Alkaline Phosphatase: 143 U/L — ABNORMAL HIGH (ref 38–126)
Anion gap: 13 (ref 5–15)
BUN: 34 mg/dL — ABNORMAL HIGH (ref 8–23)
CO2: 28 mmol/L (ref 22–32)
Calcium: 9.6 mg/dL (ref 8.9–10.3)
Chloride: 106 mmol/L (ref 98–111)
Creatinine, Ser: 1.54 mg/dL — ABNORMAL HIGH (ref 0.61–1.24)
GFR, Estimated: 47 mL/min — ABNORMAL LOW (ref >60)
Glucose, Bld: 207 mg/dL — ABNORMAL HIGH (ref 70–99)
Potassium: 4.2 mmol/L (ref 3.5–5.1)
Sodium: 147 mmol/L — ABNORMAL HIGH (ref 135–145)
Total Bilirubin: 0.6 mg/dL (ref 0.0–1.2)
Total Protein: 6.7 g/dL (ref 6.5–8.1)

## 2024-01-06 LAB — MAGNESIUM: Magnesium: 2 mg/dL (ref 1.7–2.4)

## 2024-01-06 LAB — RESP PANEL BY RT-PCR (RSV, FLU A&B, COVID)  RVPGX2
Influenza A by PCR: NEGATIVE
Influenza B by PCR: NEGATIVE
Resp Syncytial Virus by PCR: NEGATIVE
SARS Coronavirus 2 by RT PCR: NEGATIVE

## 2024-01-06 LAB — TROPONIN I (HIGH SENSITIVITY)
Troponin I (High Sensitivity): 17 ng/L (ref <18)
Troponin I (High Sensitivity): 20 ng/L — ABNORMAL HIGH (ref <18)

## 2024-01-06 MED ORDER — SODIUM CHLORIDE 0.9 % IV BOLUS
1000.0000 mL | Freq: Once | INTRAVENOUS | Status: AC
  Administered 2024-01-06: 1000 mL via INTRAVENOUS

## 2024-01-06 MED ORDER — ACETAMINOPHEN 650 MG RE SUPP
650.0000 mg | Freq: Once | RECTAL | Status: AC
  Administered 2024-01-06: 650 mg via RECTAL
  Filled 2024-01-06: qty 1

## 2024-01-06 MED ORDER — PROPOFOL 10 MG/ML IV BOLUS: 0.5000 mg/kg | Freq: Once | INTRAVENOUS | Status: DC

## 2024-01-06 MED ORDER — METOPROLOL TARTRATE 5 MG/5ML IV SOLN
5.0000 mg | Freq: Once | INTRAVENOUS | Status: AC
  Administered 2024-01-07: 5 mg via INTRAVENOUS
  Filled 2024-01-06: qty 5

## 2024-01-06 MED ORDER — PROPRANOLOL HCL 40 MG/5ML PO SOLN: 30.0000 mg | Freq: Two times a day (BID) | ORAL | 12 refills | Status: DC

## 2024-01-06 NOTE — ED Triage Notes (Signed)
 Patient coming from his nursing facility. Patient being sent in by facility due to increased tremors. Patient has hx of stroke with tremors, and subarachnoid. Per EMS, wife states he is the same as usual but facility wanted him checked out

## 2024-01-06 NOTE — ED Provider Notes (Signed)
 [place] EMERGENCY DEPARTMENT AT [facility] Provider Note   CSN: [id number] Arrival date & time: 01/06/24  1439     History {Add pertinent medical, surgical, social history, OB history to HPI:1} Chief Complaint  Patient presents with   Tremors    William Todd is a 73 y.o. male.  He is brought in from his nursing facility for increased tremor.  Patient is very difficult to get a history from.  Unclear baseline.  He has a history of cognitive impairment, prostate cancer and was admitted last month with intraparenchymal hemorrhage and subarachnoid after a fall.  He has been back at rehab for the last 5 days.  The history is provided by the nursing home and the spouse.       Home Medications Prior to Admission medications   Medication Sig Start Date End Date Taking? Authorizing Provider  acetaminophen (TYLENOL) 160 MG/5ML solution Place 20.3 mLs (650 mg total) into feeding tube every 6 (six) hours as needed for fever or mild pain (pain score 1-3). 01/01/24   Rai, Delene Ruffini, MD  amantadine (SYMMETREL) 50 MG/5ML solution Place 10 mLs (100 mg total) into feeding tube 2 (two) times daily. 01/01/24   Rai, Ripudeep K, MD  amLODipine (NORVASC) 5 MG tablet Place 1 tablet (5 mg total) into feeding tube daily. 01/01/24   Rai, Delene Ruffini, MD  atorvastatin (LIPITOR) 80 MG tablet Place 0.5 tablets (40 mg total) into feeding tube daily at 6 PM. 01/01/24   Rai, Ripudeep K, MD  fiber supplement, BANATROL TF, liquid Place 60 mLs into feeding tube 2 (two) times daily. 01/01/24   Rai, Ripudeep K, MD  insulin aspart (NOVOLOG) 100 UNIT/ML injection Inject 0-15 Units into the skin every 4 (four) hours. Sliding scale  CBG 70 - 120: 0 units: CBG 121 - 150: 2 units; CBG 151 - 200: 3 units; CBG 201 - 250: 5 units; CBG 251 - 300: 8 units;CBG 301 - 350: 11 units; CBG 351 - 400: 15 units; CBG > 400 : 15 units and notify MD 01/01/24   Rai, Ripudeep K, MD  insulin glargine (LANTUS) 100 UNIT/ML injection  Inject 0.07 mLs (7 Units total) into the skin daily. 01/02/24   Rai, Delene Ruffini, MD  levalbuterol Pauline Aus) 0.63 MG/3ML nebulizer solution Take 3 mLs (0.63 mg total) by nebulization every 6 (six) hours as needed for wheezing or shortness of breath. 01/01/24   Rai, Ripudeep K, MD  metoCLOPramide (REGLAN) 5 MG/5ML solution Place 10 mLs (10 mg total) into feeding tube 4 (four) times daily -  before meals and at bedtime. 01/01/24   Rai, Delene Ruffini, MD  metoprolol tartrate (LOPRESSOR) 50 MG tablet Place 1 tablet (50 mg total) into feeding tube 2 (two) times daily. 01/01/24   Rai, Delene Ruffini, MD  Multiple Vitamin (MULTIVITAMIN WITH MINERALS) TABS tablet Place 1 tablet into feeding tube daily. 01/02/24   Rai, Delene Ruffini, MD  Nutritional Supplements (FEEDING SUPPLEMENT, OSMOLITE 1.5 CAL,) LIQD Place 1,000 mLs into feeding tube continuous. 01/01/24   Rai, Delene Ruffini, MD  omeprazole (PRILOSEC) 20 MG capsule Take 1 capsule (20 mg total) by mouth 2 (two) times daily. Open capsule and pour contents into NG tube 01/01/24   Rai, Ripudeep K, MD  Protein (FEEDING SUPPLEMENT, PROSOURCE TF20,) liquid Place 60 mLs into feeding tube daily. 01/02/24   Rai, Delene Ruffini, MD  Water For Irrigation, Sterile (FREE WATER) SOLN Place 200 mLs into feeding tube every 4 (four) hours. 01/01/24  Rai, Delene Ruffini, MD      Allergies    Patient has no known allergies.    Review of Systems   Review of Systems  Unable to perform ROS: Mental status change    Physical Exam Updated Vital Signs BP (!) 142/61   Pulse (S) (!) 107 Comment: patient shaking  Temp 99.1 F (37.3 C)   Resp 19   Ht 6' (1.829 m)   Wt 65.3 kg   SpO2 99%   BMI 19.52 kg/m  Physical Exam Vitals and nursing note reviewed.  Constitutional:      General: He is not in acute distress.    Appearance: Normal appearance. He is well-developed.  HENT:     Head: Normocephalic and atraumatic.  Eyes:     Conjunctiva/sclera: Conjunctivae normal.  Cardiovascular:     Rate  and Rhythm: Regular rhythm. Tachycardia present.     Heart sounds: No murmur heard. Pulmonary:     Effort: Pulmonary effort is normal. No respiratory distress.     Breath sounds: Normal breath sounds.  Abdominal:     Palpations: Abdomen is soft.     Tenderness: There is no abdominal tenderness. There is no guarding or rebound.     Comments: PEG tube in place  Musculoskeletal:        General: No deformity.     Cervical back: Neck supple.  Skin:    General: Skin is warm and dry.     Capillary Refill: Capillary refill takes less than 2 seconds.  Neurological:     Mental Status: He is alert.     Comments: Patient is awake.  He is leaning towards the left.  He has some significant tremor of his bilateral upper extremities especially with any type of stimulation.  Lower extremities are fairly quiet and stiff.  Strength and sensation unable to be tested     ED Results / Procedures / Treatments   Labs (all labs ordered are listed, but only abnormal results are displayed) Labs Reviewed - No data to display  EKG None  Radiology No results found.  Procedures Procedures  {Document cardiac monitor, telemetry assessment procedure when appropriate:1}  Medications Ordered in ED Medications - No data to display  ED Course/ Medical Decision Making/ A&P   {   Click here for ABCD2, HEART and other calculatorsREFRESH Note before signing :1}                              Medical Decision Making Amount and/or Complexity of Data Reviewed Labs: ordered. Radiology: ordered.   This patient complains of ***; this involves an extensive number of treatment Options and is a complaint that carries with it a high risk of complications and morbidity. The differential includes ***  I ordered, reviewed and interpreted labs, which included *** I ordered medication *** and reviewed PMP when indicated. I ordered imaging studies which included *** and I independently    visualized and interpreted  imaging which showed *** Additional history obtained from *** Previous records obtained and reviewed *** I consulted *** and discussed lab and imaging findings and discussed disposition.  Cardiac monitoring reviewed, *** Social determinants considered, *** Critical Interventions: ***  After the interventions stated above, I reevaluated the patient and found *** Admission and further testing considered, ***   {Document critical care time when appropriate:1} {Document review of labs and clinical decision tools ie heart score, Chads2Vasc2 etc:1}  {Document your  independent review of radiology images, and any outside records:1} {Document your discussion with family members, caretakers, and with consultants:1} {Document social determinants of health affecting pt's care:1} {Document your decision making why or why not admission, treatments were needed:1} Final Clinical Impression(s) / ED Diagnoses Final diagnoses:  None    Rx / DC Orders ED Discharge Orders     None

## 2024-01-06 NOTE — Discharge Instructions (Addendum)
 You were seen in the emergency department for increased tremor.  Your CAT scan and lab work did not show an obvious explanation for your symptoms.  Neurology is recommending stopping the metoprolol and starting on propranolol as it may be better for tremor.  Your blood pressure medications may need further adjustment.

## 2024-01-07 ENCOUNTER — Inpatient Hospital Stay (HOSPITAL_COMMUNITY)

## 2024-01-07 DIAGNOSIS — Z681 Body mass index (BMI) 19 or less, adult: Secondary | ICD-10-CM | POA: Diagnosis not present

## 2024-01-07 DIAGNOSIS — H1033 Unspecified acute conjunctivitis, bilateral: Secondary | ICD-10-CM | POA: Diagnosis not present

## 2024-01-07 DIAGNOSIS — Z794 Long term (current) use of insulin: Secondary | ICD-10-CM | POA: Diagnosis not present

## 2024-01-07 DIAGNOSIS — E785 Hyperlipidemia, unspecified: Secondary | ICD-10-CM | POA: Diagnosis present

## 2024-01-07 DIAGNOSIS — E872 Acidosis, unspecified: Secondary | ICD-10-CM | POA: Diagnosis present

## 2024-01-07 DIAGNOSIS — L8990 Pressure ulcer of unspecified site, unspecified stage: Secondary | ICD-10-CM

## 2024-01-07 DIAGNOSIS — R569 Unspecified convulsions: Secondary | ICD-10-CM | POA: Diagnosis not present

## 2024-01-07 DIAGNOSIS — E43 Unspecified severe protein-calorie malnutrition: Secondary | ICD-10-CM | POA: Diagnosis present

## 2024-01-07 DIAGNOSIS — R6510 Systemic inflammatory response syndrome (SIRS) of non-infectious origin without acute organ dysfunction: Secondary | ICD-10-CM | POA: Diagnosis not present

## 2024-01-07 DIAGNOSIS — R1312 Dysphagia, oropharyngeal phase: Secondary | ICD-10-CM | POA: Diagnosis present

## 2024-01-07 DIAGNOSIS — I129 Hypertensive chronic kidney disease with stage 1 through stage 4 chronic kidney disease, or unspecified chronic kidney disease: Secondary | ICD-10-CM | POA: Diagnosis present

## 2024-01-07 DIAGNOSIS — E871 Hypo-osmolality and hyponatremia: Secondary | ICD-10-CM | POA: Diagnosis present

## 2024-01-07 DIAGNOSIS — G252 Other specified forms of tremor: Secondary | ICD-10-CM | POA: Diagnosis not present

## 2024-01-07 DIAGNOSIS — E870 Hyperosmolality and hypernatremia: Secondary | ICD-10-CM | POA: Diagnosis present

## 2024-01-07 DIAGNOSIS — R509 Fever, unspecified: Secondary | ICD-10-CM

## 2024-01-07 DIAGNOSIS — Z66 Do not resuscitate: Secondary | ICD-10-CM | POA: Diagnosis present

## 2024-01-07 DIAGNOSIS — R251 Tremor, unspecified: Secondary | ICD-10-CM | POA: Diagnosis present

## 2024-01-07 DIAGNOSIS — E875 Hyperkalemia: Secondary | ICD-10-CM | POA: Diagnosis present

## 2024-01-07 DIAGNOSIS — Z931 Gastrostomy status: Secondary | ICD-10-CM | POA: Diagnosis not present

## 2024-01-07 DIAGNOSIS — Z515 Encounter for palliative care: Secondary | ICD-10-CM | POA: Diagnosis not present

## 2024-01-07 DIAGNOSIS — E1122 Type 2 diabetes mellitus with diabetic chronic kidney disease: Secondary | ICD-10-CM | POA: Diagnosis present

## 2024-01-07 DIAGNOSIS — Z7189 Other specified counseling: Secondary | ICD-10-CM | POA: Diagnosis not present

## 2024-01-07 DIAGNOSIS — Z8679 Personal history of other diseases of the circulatory system: Secondary | ICD-10-CM | POA: Diagnosis not present

## 2024-01-07 DIAGNOSIS — Z1152 Encounter for screening for COVID-19: Secondary | ICD-10-CM | POA: Diagnosis not present

## 2024-01-07 DIAGNOSIS — L89222 Pressure ulcer of left hip, stage 2: Secondary | ICD-10-CM | POA: Diagnosis present

## 2024-01-07 DIAGNOSIS — A419 Sepsis, unspecified organism: Principal | ICD-10-CM

## 2024-01-07 DIAGNOSIS — N179 Acute kidney failure, unspecified: Secondary | ICD-10-CM | POA: Diagnosis not present

## 2024-01-07 DIAGNOSIS — J690 Pneumonitis due to inhalation of food and vomit: Secondary | ICD-10-CM | POA: Diagnosis present

## 2024-01-07 DIAGNOSIS — F0390 Unspecified dementia without behavioral disturbance: Secondary | ICD-10-CM | POA: Diagnosis present

## 2024-01-07 DIAGNOSIS — G9341 Metabolic encephalopathy: Secondary | ICD-10-CM | POA: Diagnosis present

## 2024-01-07 DIAGNOSIS — L89150 Pressure ulcer of sacral region, unstageable: Secondary | ICD-10-CM | POA: Diagnosis present

## 2024-01-07 DIAGNOSIS — J9601 Acute respiratory failure with hypoxia: Secondary | ICD-10-CM | POA: Diagnosis not present

## 2024-01-07 DIAGNOSIS — N1831 Chronic kidney disease, stage 3a: Secondary | ICD-10-CM | POA: Diagnosis present

## 2024-01-07 LAB — COMPREHENSIVE METABOLIC PANEL
ALT: 69 U/L — ABNORMAL HIGH (ref 0–44)
AST: 36 U/L (ref 15–41)
Albumin: 2.3 g/dL — ABNORMAL LOW (ref 3.5–5.0)
Alkaline Phosphatase: 132 U/L — ABNORMAL HIGH (ref 38–126)
Anion gap: 11 (ref 5–15)
BUN: 27 mg/dL — ABNORMAL HIGH (ref 8–23)
CO2: 24 mmol/L (ref 22–32)
Calcium: 9.1 mg/dL (ref 8.9–10.3)
Chloride: 113 mmol/L — ABNORMAL HIGH (ref 98–111)
Creatinine, Ser: 1.34 mg/dL — ABNORMAL HIGH (ref 0.61–1.24)
GFR, Estimated: 56 mL/min — ABNORMAL LOW (ref >60)
Glucose, Bld: 210 mg/dL — ABNORMAL HIGH (ref 70–99)
Potassium: 4.3 mmol/L (ref 3.5–5.1)
Sodium: 148 mmol/L — ABNORMAL HIGH (ref 135–145)
Total Bilirubin: 0.8 mg/dL (ref 0.0–1.2)
Total Protein: 6.3 g/dL — ABNORMAL LOW (ref 6.5–8.1)

## 2024-01-07 LAB — GLUCOSE, CAPILLARY
Glucose-Capillary: 182 mg/dL — ABNORMAL HIGH (ref 70–99)
Glucose-Capillary: 181 mg/dL — ABNORMAL HIGH (ref 70–99)
Glucose-Capillary: 180 mg/dL — ABNORMAL HIGH (ref 70–99)
Glucose-Capillary: 233 mg/dL — ABNORMAL HIGH (ref 70–99)

## 2024-01-07 LAB — CBC
HCT: 24.8 % — ABNORMAL LOW (ref 39.0–52.0)
Hemoglobin: 7.3 g/dL — ABNORMAL LOW (ref 13.0–17.0)
MCH: 29.2 pg (ref 26.0–34.0)
MCHC: 29.4 g/dL — ABNORMAL LOW (ref 30.0–36.0)
MCV: 99.2 fL (ref 80.0–100.0)
Platelets: 198 10*3/uL (ref 150–400)
RBC: 2.5 MIL/uL — ABNORMAL LOW (ref 4.22–5.81)
RDW: 16.7 % — ABNORMAL HIGH (ref 11.5–15.5)
WBC: 10.2 10*3/uL (ref 4.0–10.5)
nRBC: 0 % (ref 0.0–0.2)

## 2024-01-07 LAB — I-STAT CG4 LACTIC ACID, ED
Lactic Acid, Venous: 2.3 mmol/L (ref 0.5–1.9)
Lactic Acid, Venous: 2.5 mmol/L (ref 0.5–1.9)

## 2024-01-07 MED ORDER — VANCOMYCIN HCL IN DEXTROSE 1-5 GM/200ML-% IV SOLN: 1000.0000 mg | INTRAVENOUS | Status: DC

## 2024-01-07 MED ORDER — AMLODIPINE BESYLATE 5 MG PO TABS
5.0000 mg | ORAL_TABLET | Freq: Every day | ORAL | Status: DC
  Administered 2024-01-07 – 2024-01-08 (×2): 5 mg
  Filled 2024-01-07 (×2): qty 1

## 2024-01-07 MED ORDER — METRONIDAZOLE 500 MG/100ML IV SOLN
500.0000 mg | Freq: Two times a day (BID) | INTRAVENOUS | Status: DC
  Administered 2024-01-07 (×2): 500 mg via INTRAVENOUS
  Filled 2024-01-07 (×2): qty 100

## 2024-01-07 MED ORDER — INSULIN ASPART 100 UNIT/ML IJ SOLN
0.0000 [IU] | INTRAMUSCULAR | Status: DC
  Administered 2024-01-07: 3 [IU] via SUBCUTANEOUS
  Administered 2024-01-07: 2 [IU] via SUBCUTANEOUS
  Administered 2024-01-08: 3 [IU] via SUBCUTANEOUS
  Administered 2024-01-08: 1 [IU] via SUBCUTANEOUS

## 2024-01-07 MED ORDER — SODIUM CHLORIDE 0.9 % IV SOLN
3.0000 g | Freq: Four times a day (QID) | INTRAVENOUS | Status: DC
  Administered 2024-01-07 – 2024-01-09 (×9): 3 g via INTRAVENOUS
  Filled 2024-01-07 (×8): qty 8

## 2024-01-07 MED ORDER — INSULIN GLARGINE-YFGN 100 UNIT/ML ~~LOC~~ SOLN
7.0000 [IU] | Freq: Every day | SUBCUTANEOUS | Status: DC
  Administered 2024-01-07 – 2024-01-11 (×5): 7 [IU] via SUBCUTANEOUS
  Filled 2024-01-07 (×8): qty 0.07

## 2024-01-07 MED ORDER — GERHARDT'S BUTT CREAM
TOPICAL_CREAM | Freq: Two times a day (BID) | CUTANEOUS | Status: DC
  Administered 2024-01-08: 1 via TOPICAL
  Filled 2024-01-07: qty 60

## 2024-01-07 MED ORDER — PROSOURCE TF20 ENFIT COMPATIBL EN LIQD
60.0000 mL | Freq: Every day | ENTERAL | Status: DC
  Administered 2024-01-07 – 2024-01-14 (×8): 60 mL
  Filled 2024-01-07 (×7): qty 60

## 2024-01-07 MED ORDER — OSMOLITE 1.5 CAL PO LIQD
1000.0000 mL | ORAL | Status: DC
  Administered 2024-01-07 – 2024-01-13 (×6): 1000 mL
  Filled 2024-01-07 (×10): qty 1000

## 2024-01-07 MED ORDER — LACTATED RINGERS IV SOLN
150.0000 mL/h | INTRAVENOUS | Status: DC
  Administered 2024-01-07: 150 mL/h via INTRAVENOUS

## 2024-01-07 MED ORDER — MEDIHONEY WOUND/BURN DRESSING EX PSTE
1.0000 | PASTE | Freq: Every day | CUTANEOUS | Status: DC
  Administered 2024-01-07 – 2024-01-14 (×9): 1 via TOPICAL
  Filled 2024-01-07: qty 44

## 2024-01-07 MED ORDER — LEVALBUTEROL HCL 0.63 MG/3ML IN NEBU
0.6300 mg | INHALATION_SOLUTION | Freq: Four times a day (QID) | RESPIRATORY_TRACT | Status: DC | PRN
  Administered 2024-01-10 – 2024-01-13 (×5): 0.63 mg via RESPIRATORY_TRACT
  Filled 2024-01-07 (×8): qty 3

## 2024-01-07 MED ORDER — SODIUM CHLORIDE 0.9 % IV SOLN
2.0000 g | Freq: Two times a day (BID) | INTRAVENOUS | Status: DC
  Administered 2024-01-07: 2 g via INTRAVENOUS
  Filled 2024-01-07 (×2): qty 12.5

## 2024-01-07 MED ORDER — METOCLOPRAMIDE HCL 5 MG/5ML PO SOLN
10.0000 mg | Freq: Three times a day (TID) | ORAL | Status: DC
  Administered 2024-01-07 – 2024-01-08 (×8): 10 mg
  Filled 2024-01-07 (×10): qty 10

## 2024-01-07 MED ORDER — ONDANSETRON HCL 4 MG PO TABS: 4.0000 mg | ORAL_TABLET | Freq: Four times a day (QID) | ORAL | Status: DC | PRN

## 2024-01-07 MED ORDER — AMANTADINE HCL 50 MG/5ML PO SOLN
100.0000 mg | Freq: Two times a day (BID) | ORAL | Status: DC
  Administered 2024-01-07 – 2024-01-14 (×15): 100 mg
  Filled 2024-01-07 (×17): qty 10

## 2024-01-07 MED ORDER — FREE WATER
300.0000 mL | Freq: Four times a day (QID) | Status: DC
Start: 2024-01-07 — End: 2024-01-08
  Administered 2024-01-07 – 2024-01-08 (×5): 300 mL

## 2024-01-07 MED ORDER — VANCOMYCIN HCL 1250 MG/250ML IV SOLN
1250.0000 mg | Freq: Once | INTRAVENOUS | Status: AC
  Administered 2024-01-07: 1250 mg via INTRAVENOUS
  Filled 2024-01-07: qty 250

## 2024-01-07 MED ORDER — FAMOTIDINE 20 MG PO TABS
20.0000 mg | ORAL_TABLET | Freq: Two times a day (BID) | ORAL | Status: DC
  Administered 2024-01-07 – 2024-01-14 (×15): 20 mg
  Filled 2024-01-07 (×15): qty 1

## 2024-01-07 MED ORDER — ATORVASTATIN CALCIUM 40 MG PO TABS
40.0000 mg | ORAL_TABLET | Freq: Every day | ORAL | Status: DC
  Administered 2024-01-07 – 2024-01-13 (×7): 40 mg
  Filled 2024-01-07 (×7): qty 1

## 2024-01-07 MED ORDER — ACETAMINOPHEN 160 MG/5ML PO SOLN
650.0000 mg | Freq: Four times a day (QID) | ORAL | Status: DC | PRN
  Administered 2024-01-07 – 2024-01-12 (×6): 650 mg
  Filled 2024-01-07 (×6): qty 20.3

## 2024-01-07 MED ORDER — LORAZEPAM 0.5 MG PO TABS
0.5000 mg | ORAL_TABLET | Freq: Four times a day (QID) | ORAL | Status: DC | PRN
  Administered 2024-01-07: 0.5 mg
  Filled 2024-01-07 (×2): qty 1

## 2024-01-07 MED ORDER — ADULT MULTIVITAMIN W/MINERALS CH
1.0000 | ORAL_TABLET | Freq: Every day | ORAL | Status: DC
  Administered 2024-01-07 – 2024-01-14 (×8): 1
  Filled 2024-01-07 (×8): qty 1

## 2024-01-07 MED ORDER — BANATROL TF EN LIQD
60.0000 mL | Freq: Two times a day (BID) | ENTERAL | Status: DC
  Administered 2024-01-07 – 2024-01-14 (×15): 60 mL
  Filled 2024-01-07 (×15): qty 60

## 2024-01-07 MED ORDER — ONDANSETRON HCL 4 MG/2ML IJ SOLN
4.0000 mg | Freq: Four times a day (QID) | INTRAMUSCULAR | Status: DC | PRN
Start: 2024-01-07 — End: 2024-01-14

## 2024-01-07 MED ORDER — CIPROFLOXACIN HCL 0.3 % OP SOLN
1.0000 [drp] | OPHTHALMIC | Status: DC
  Administered 2024-01-07 – 2024-01-14 (×34): 1 [drp] via OPHTHALMIC
  Filled 2024-01-07: qty 2.5

## 2024-01-07 MED ORDER — ENOXAPARIN SODIUM 40 MG/0.4ML IJ SOSY
40.0000 mg | PREFILLED_SYRINGE | Freq: Every day | INTRAMUSCULAR | Status: DC
Start: 2024-01-07 — End: 2024-01-14
  Administered 2024-01-07 – 2024-01-14 (×8): 40 mg via SUBCUTANEOUS
  Filled 2024-01-07 (×8): qty 0.4

## 2024-01-07 NOTE — Assessment & Plan Note (Signed)
 Dietary consult for tube feed management

## 2024-01-07 NOTE — Assessment & Plan Note (Addendum)
 Ulcers on left heel unstageable, stage 3 on sacrum and healed superficial on left hip Decubitus precautions Wound care consult

## 2024-01-07 NOTE — Hospital Course (Signed)
 William Todd

## 2024-01-07 NOTE — TOC Initial Note (Signed)
 Transition of Care [facility]) - Initial/Assessment Note    Patient Details  Name: William Todd MRN: [id number] Date of Birth: [birth year]  [facility] CM/SW Contact:    Mearl Latin, LCSW Phone Number: 01/07/2024, 9:49 AM  Clinical Narrative:                 Patient admitted from [facility] where he was admitted for rehab on 3/20. CSW will continue to follow.     Expected Discharge Plan: Skilled Nursing Facility Barriers to Discharge: Continued Medical Work up   Patient Goals and CMS Choice Patient states their goals for this hospitalization and ongoing recovery are:: Rehab          Expected Discharge Plan and Services In-house Referral: Clinical Social Work     Living arrangements for the past 2 months: Single Family Home, Skilled Nursing Facility                                      Prior Living Arrangements/Services Living arrangements for the past 2 months: Single Family Home, Skilled Nursing Facility Lives with:: Spouse Patient language and need for interpreter reviewed:: Yes Do you feel safe going back to the place where you live?: Yes      Need for Family Participation in Patient Care: Yes (Comment) Care giver support system in place?: Yes (comment)   Criminal Activity/Legal Involvement Pertinent to Current Situation/Hospitalization: No - Comment as needed  Activities of Daily Living      Permission Sought/Granted Permission sought to share information with : Facility Medical sales representative, Family Supports Permission granted to share information with : No              Emotional Assessment Appearance:: Appears stated age Attitude/Demeanor/Rapport: Unable to Assess Affect (typically observed): Unable to Assess Orientation: :  (Disoriented x4) Alcohol / Substance Use: Not Applicable Psych Involvement: No (comment)  Admission diagnosis:  Tremor [R25.1] Coarse tremors [G25.2] Patient Active Problem List   Diagnosis Date  Noted   Coarse tremors 01/07/2024   Febrile illness 01/07/2024   Sepsis (HCC) 01/07/2024   History of intracranial hemorrhage and subarachnoid hemorrhage 11/29/23 01/07/2024   Decubitus ulcer 01/07/2024   S/P percutaneous endoscopic gastrostomy (PEG) tube placement 12/23/2023(HCC) 01/07/2024   Hypernatremia 01/07/2024   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   Acute conjunctivitis, bilateral 01/07/2024   Protein-calorie malnutrition, severe 12/01/2023   Intraparenchymal hemorrhage of brain (HCC) 11/30/2023   Hypertension 11/30/2023   History of prostate cancer 11/30/2023   Non-insulin dependent type 2 diabetes mellitus (HCC) 11/30/2023   Normocytic anemia 11/30/2023   History of mood disorder 11/30/2023   HLD (hyperlipidemia) 11/30/2023   Subarachnoid hemorrhage (HCC) 11/30/2023   Acute metabolic encephalopathy 11/30/2023   ICH (intracerebral hemorrhage) (HCC) 11/30/2023   Malnutrition of moderate degree 11/19/2023   History of closed left femoral fracture s/p arthroplasty (HCC) 11/15/2023   Abnormal head CT 11/15/2023   Long-term memory impairment 11/15/2023   Acute kidney injury superimposed on chronic kidney disease (HCC) 11/15/2023   Prostate cancer (HCC) 07/15/2013   PCP:  Knox Royalty, MD Pharmacy:   [facility] [phone] [place], [place] - [address] [place] [address] Phone: [phone] Fax: [phone]  [id number] [place] [facility] - [address] [place] [address] Phone: [phone] Fax: [phone]     Social Drivers of Health (SDOH) Social History: SDOH  Screenings   Food Insecurity: Patient Unable To Answer (01/07/2024)  Housing: Patient Unable To Answer (01/07/2024)  Transportation Needs: Patient Unable To Answer (01/07/2024)  Utilities: Patient Unable To Answer (01/07/2024)  Social Connections: Patient Unable To Answer (01/07/2024)  Tobacco Use: Low Risk  (01/06/2024)   SDOH  Interventions:     Readmission Risk Interventions    12/08/2023   12:40 PM  Readmission Risk Prevention Plan  Transportation Screening Complete  Medication Review (RN Care Manager) Complete  PCP or Specialist appointment within 3-5 days of discharge Complete  HRI or Home Care Consult Complete  SW Recovery Care/Counseling Consult Complete  Palliative Care Screening Complete  Skilled Nursing Facility Complete

## 2024-01-07 NOTE — Assessment & Plan Note (Signed)
 Dietary consult

## 2024-01-07 NOTE — Assessment & Plan Note (Addendum)
 CT head showing interval improvement No acute disease suspected

## 2024-01-07 NOTE — Plan of Care (Signed)

## 2024-01-07 NOTE — Assessment & Plan Note (Addendum)
 Etiology uncertain Neurology was consulted from the ED with recommendation for switching metoprolol to propranolol Received a dose of metoprolol in the ED EEG on 2/19 showed evidence of epileptogenicity and cortical dysfunction arising from left frontal region likely secondary to underlying ICH  Consider neurology to follow

## 2024-01-07 NOTE — Progress Notes (Signed)
 Pharmacy Antibiotic Note  William Todd is a 73 y.o. male admitted on 01/06/2024 with sepsis/PNA.  Pharmacy has been consulted for unasyn dosing.    Plan: DC vanco DC cefepime Start unasyn 3 gm iv q6h  Height: 6' (182.9 cm) Weight: 65.3 kg (143 lb 15.4 oz) IBW/kg (Calculated) : 77.6  Temp (24hrs), Avg:99.1 F (37.3 C), Min:98.1 F (36.7 C), Max:100.4 F (38 C)  Recent Labs  Lab 01/06/24 1512 01/07/24 0018 01/07/24 0025 01/07/24 0422  WBC 13.5*  --   --  10.2  CREATININE 1.54*  --   --  1.34*  LATICACIDVEN  --  2.3* 2.5*  --     Estimated Creatinine Clearance: 45.3 mL/min (A) (by C-G formula based on SCr of 1.34 mg/dL (H)).    No Known Allergies  Micro 3/26 Bcx- ngtd <12H  William Todd BS, PharmD, BCPS Clinical Pharmacist 01/07/2024 12:37 PM  Contact: [phone] after 3 PM  "Be curious, not judgmental..." -Debbora Dus

## 2024-01-07 NOTE — Assessment & Plan Note (Signed)
 No acute issues suspected

## 2024-01-07 NOTE — Assessment & Plan Note (Signed)
 Cipro drops

## 2024-01-07 NOTE — Progress Notes (Signed)
 EEG complete - results pending

## 2024-01-07 NOTE — H&P (Signed)
 History and Physical    Patient: William Todd WUJ:[id number] DOB: 01/06/1951 DOA: 01/06/2024 DOS: the patient was seen and examined on 01/07/2024 PCP: Knox Royalty, MD  Patient coming from: SNF  Chief Complaint:  Chief Complaint  Patient presents with   Tremors    HPI: William Todd is a 73 y.o. male with medical history significant for Who was sent from rehab facility for evaluation of upper extremity tremors.  During workup in the ED he spiked a temperature of 100.7.  Patient had a recent prolonged hospitalization from 2/15 to 01/01/2024 with a large frontal ICH hemorrhage initially admitted to the ICU, with stay complicated by intermittent fevers from suspected aspiration, treated with Unasyn x 5 days, undergoing PEG tube placement on 3/11, developing diarrhea related to tube feeds, and stay otherwise notable for pressure ulcers and severe protein calorie malnutrition.  In the ED, patient was noted to have coarse tremor of the hands when stimulated. ED course and data review: Tmax 100.4, tachycardic on arrival to 107 up to the 1 teens, intermittently tachypneic to the low 20s.  SBP mostly in the 130s and O2 sat in the high 90s on room air. Labs notable for WBC 13,500, up from 10,000 a week prior to discharge.  Lactic acid 2.5. Hemoglobin at baseline at 7.9 Sodium 147, up from 140 Creatinine 1.54, baseline 1.24 Mildly abnormal LFTs with AST 44, ALT 90 and alk phos 143 Troponin 20 Urinalysis unremarkable EKG, personally viewed and interpreted showing sinus at 106 with nonspecific ST-T wave changes CT head showing interval decrease in size and density of left frontal intraparenchymal hematoma with decreased edema and midline shift and decreasing subarachnoid hemorrhage as well as a chronic right occipital infarct Chest x-ray nonacute-that showed dense stool within the partially visualized colon  Patient treated with NS bolus  The ED provider spoke with neurology, Dr. Derry Lory who  recommended propranolol/beta-blocker for tremors.  Patient received IV metoprolol  Hospitalist consulted for admission.     Past Medical History:  Diagnosis Date   Anxiety    new dx   Chronic kidney disease    Diabetes mellitus    type ii   History of radiation therapy 09/29/13- 11/25/13   prostate 7800 cGy 40 sessions, seminal vesicles 5600 cGy 40 sessions   Hypertension    Prostate cancer (HCC) 06/01/2013   gleason 7   Past Surgical History:  Procedure Laterality Date   CARDIAC CATHETERIZATION  01/01/2013   FRACTURE SURGERY     left foot    HIP ARTHROPLASTY Left 11/16/2023   Procedure: ARTHROPLASTY BIPOLAR HIP (HEMIARTHROPLASTY);  Surgeon: Sheral Apley, MD;  Location: [facility] OR;  Service: Orthopedics;  Laterality: Left;   IR GASTROSTOMY TUBE MOD SED  12/23/2023   LEFT HEART CATHETERIZATION WITH CORONARY ANGIOGRAM N/A 01/01/2013   Procedure: LEFT HEART CATHETERIZATION WITH CORONARY ANGIOGRAM;  Surgeon: Robynn Pane, MD;  Location: [facility] CATH LAB;  Service: Cardiovascular;  Laterality: N/A;   PROSTATE BIOPSY  06/01/13   Social History:  reports that he has never smoked. He has never used smokeless tobacco. He reports that he does not drink alcohol and does not use drugs.  No Known Allergies  Family History  Problem Relation Age of Onset   Prostate cancer Brother 75       seed implant    Prior to Admission medications   Medication Sig Start Date End Date Taking? Authorizing Provider  acetaminophen (TYLENOL) 160 MG/5ML solution Place 20.3 mLs (650 mg total) into  feeding tube every 6 (six) hours as needed for fever or mild pain (pain score 1-3). 01/01/24  Yes Rai, Ripudeep K, MD  amantadine (SYMMETREL) 50 MG/5ML solution Place 10 mLs (100 mg total) into feeding tube 2 (two) times daily. 01/01/24  Yes Rai, Ripudeep K, MD  amLODipine (NORVASC) 5 MG tablet Place 1 tablet (5 mg total) into feeding tube daily. 01/01/24  Yes Rai, Ripudeep K, MD  atorvastatin (LIPITOR) 80 MG tablet Place  0.5 tablets (40 mg total) into feeding tube daily at 6 PM. 01/01/24  Yes Rai, Ripudeep K, MD  fiber supplement, BANATROL TF, liquid Place 60 mLs into feeding tube 2 (two) times daily. 01/01/24  Yes Rai, Ripudeep K, MD  insulin aspart (NOVOLOG) 100 UNIT/ML injection Inject 0-15 Units into the skin every 4 (four) hours. Sliding scale  CBG 70 - 120: 0 units: CBG 121 - 150: 2 units; CBG 151 - 200: 3 units; CBG 201 - 250: 5 units; CBG 251 - 300: 8 units;CBG 301 - 350: 11 units; CBG 351 - 400: 15 units; CBG > 400 : 15 units and notify MD 01/01/24  Yes Rai, Ripudeep K, MD  Insulin Glargine (BASAGLAR KWIKPEN [employer]) Inject 7 Units into the skin at bedtime.   Yes [provider]  levalbuterol Pauline Aus) 0.63 MG/3ML nebulizer solution Take 3 mLs (0.63 mg total) by nebulization every 6 (six) hours as needed for wheezing or shortness of breath. 01/01/24  Yes Rai, Ripudeep K, MD  metoCLOPramide (REGLAN) 5 MG/5ML solution Place 10 mLs (10 mg total) into feeding tube 4 (four) times daily -  before meals and at bedtime. 01/01/24  Yes Rai, Ripudeep K, MD  Multiple Vitamin (MULTIVITAMIN WITH MINERALS) TABS tablet Place 1 tablet into feeding tube daily. 01/02/24  Yes Rai, Ripudeep K, MD  pantoprazole sodium (PROTONIX) 40 mg Place 40 mg into feeding tube 2 (two) times daily.   Yes [provider]  propranolol 40 MG/5ML solution Take 3.8 mLs (30.4 mg total) by mouth 2 (two) times daily. 01/06/24  Yes Terrilee Files, MD    Physical Exam: Vitals:   01/06/24 2230 01/06/24 2238 01/06/24 2300 01/06/24 2340  BP:  (!) 137/93 (!) 149/82   Pulse: (!) 114 (!) 122 (!) 115   Resp:  (!) 26    Temp:  98.3 F (36.8 C)  (!) 100.4 F (38 C)  TempSrc:  Axillary  Rectal  SpO2: 100% 98% 100%   Weight:      Height:       Physical Exam Vitals and nursing note reviewed.  Constitutional:      General: He is not in acute distress.    Appearance: He is underweight.     Comments: Frail appearing, tremoulous  HENT:      Head: Normocephalic and atraumatic.  Eyes:     General:        Right eye: Discharge present.        Left eye: Discharge present.    Conjunctiva/sclera:     Right eye: Right conjunctiva is injected.     Left eye: Left conjunctiva is injected.     Comments: Thick discharge eyes with conjunctival injection  Cardiovascular:     Rate and Rhythm: Regular rhythm. Tachycardia present.     Heart sounds: Normal heart sounds.  Pulmonary:     Effort: Tachypnea present. No respiratory distress.     Breath sounds: Normal breath sounds.  Abdominal:     Palpations: Abdomen is soft.  Tenderness: There is no abdominal tenderness.     Comments: G tube site intact though dressings have dark brown crusted soiling  Skin:    Comments: Decubitus ulcers, see pics  Neurological:     Comments: Generalized tremor          Labs on Admission: I have personally reviewed following labs and imaging studies  CBC: Recent Labs  Lab 01/06/24 1512  WBC 13.5*  NEUTROABS 12.0*  HGB 7.9*  HCT 26.4*  MCV 97.8  PLT 239   Basic Metabolic Panel: Recent Labs  Lab 01/06/24 1512  NA 147*  K 4.2  CL 106  CO2 28  GLUCOSE 207*  BUN 34*  CREATININE 1.54*  CALCIUM 9.6  MG 2.0   GFR: Estimated Creatinine Clearance: 39.5 mL/min (A) (by C-G formula based on SCr of 1.54 mg/dL (H)). Liver Function Tests: Recent Labs  Lab 01/06/24 1512  AST 44*  ALT 90*  ALKPHOS 143*  BILITOT 0.6  PROT 6.7  ALBUMIN 2.5*   No results for input(s): "LIPASE", "AMYLASE" in the last 168 hours. Recent Labs  Lab 01/06/24 1512  AMMONIA 16   Coagulation Profile: No results for input(s): "INR", "PROTIME" in the last 168 hours. Cardiac Enzymes: No results for input(s): "CKTOTAL", "CKMB", "CKMBINDEX", "TROPONINI" in the last 168 hours. BNP (last 3 results) No results for input(s): "PROBNP" in the last 8760 hours. HbA1C: No results for input(s): "HGBA1C" in the last 72 hours. CBG: Recent Labs  Lab 12/31/23 2021  01/01/24 0030 01/01/24 0351 01/01/24 0806 01/01/24 1254  GLUCAP 130* 186* 153* 119* 204*   Lipid Profile: No results for input(s): "CHOL", "HDL", "LDLCALC", "TRIG", "CHOLHDL", "LDLDIRECT" in the last 72 hours. Thyroid Function Tests: No results for input(s): "TSH", "T4TOTAL", "FREET4", "T3FREE", "THYROIDAB" in the last 72 hours. Anemia Panel: No results for input(s): "VITAMINB12", "FOLATE", "FERRITIN", "TIBC", "IRON", "RETICCTPCT" in the last 72 hours. Urine analysis:    Component Value Date/Time   COLORURINE YELLOW 01/06/2024 2340   APPEARANCEUR HAZY (A) 01/06/2024 2340   LABSPEC 1.010 01/06/2024 2340   PHURINE 5.0 01/06/2024 2340   GLUCOSEU >=500 (A) 01/06/2024 2340   HGBUR SMALL (A) 01/06/2024 2340   BILIRUBINUR NEGATIVE 01/06/2024 2340   KETONESUR NEGATIVE 01/06/2024 2340   PROTEINUR 30 (A) 01/06/2024 2340   NITRITE NEGATIVE 01/06/2024 2340   LEUKOCYTESUR NEGATIVE 01/06/2024 2340    Radiological Exams on Admission: CT Head Wo Contrast Result Date: 01/06/2024 CLINICAL DATA:  Tremors EXAM: CT HEAD WITHOUT CONTRAST TECHNIQUE: Contiguous axial images were obtained from the base of the skull through the vertex without intravenous contrast. RADIATION DOSE REDUCTION: This exam was performed according to the departmental dose-optimization program which includes automated exposure control, adjustment of the mA and/or kV according to patient size and/or use of iterative reconstruction technique. COMPARISON:  CT brain 12/12/2023 FINDINGS: Brain: Motion degradation on multiple images limits the exam. Decreasing size of left frontal intraparenchymal hematoma, also appears more hypodense in the interim consistent with interval evolutionary changes. Resolution of intraventricular blood compared to prior. Scattered subarachnoid hemorrhage not progressive and appears decreased in the interim as well. Residual 3 mm midline shift to the right, decreased from 7 mm previously. Decreased edema  surrounding left frontal hematoma. Chronic right occipital infarct. Residual mass effect on left frontal horn but also diminished. Vascular: No hyperdense vessels.  Carotid vascular calcifications Skull: Normal. Negative for fracture or focal lesion. Incompletely visualized bony mass off the right mandible. Sinuses/Orbits: No acute finding. Other: None IMPRESSION: 1. Motion degradation  on multiple images limits the exam. Interval decreased size and density of left frontal intraparenchymal hematoma with decreased edema and decreased midline shift to the right compared to prior. Decreasing subarachnoid hemorrhage and interval resolution of intraventricular blood. 2. Chronic right occipital infarct Electronically Signed   By: Jasmine Pang M.D.   On: 01/06/2024 18:45   DG Chest Port 1 View Result Date: 01/06/2024 CLINICAL DATA:  Weakness and tremors. EXAM: PORTABLE CHEST 1 VIEW COMPARISON:  AP chest 12/24/2023 FINDINGS: Cardiac silhouette and mediastinal contours are within normal limits. Mild atherosclerotic calcifications within the aortic arch. The lungs are clear. No pleural effusion or pneumothorax. No acute skeletal abnormality.Dense stool is seen within the partially visualized colon. IMPRESSION: 1.  No acute cardiopulmonary disease process. 2. Dense stool within the partially visualized colon. Electronically Signed   By: Neita Garnet M.D.   On: 01/06/2024 17:59     Data Reviewed: Relevant notes from primary care and specialist visits, past discharge summaries as available in EHR, including Care Everywhere. Prior diagnostic testing as pertinent to current admission diagnoses Updated medications and problem lists for reconciliation ED course, including vitals, labs, imaging, treatment and response to treatment Triage notes, nursing and pharmacy notes and ED provider's notes Notable results as noted in HPI   Assessment and Plan: * Sepsis (HCC) Febrile illness, suspect aspiration  pneumonitis Sepsis criteria include fever, tachycardia and tachypnea, leukocytosis with lactic acidosis Source uncertain but suspect aspiration pneumonia versus aspiration pneumonitis-were treated empirically with Unasyn during recent hospitalization Respiratory viral panel negative, urinalysis unremarkable Continue sepsis fluids Empiric antibiotics for sepsis of unknown source Follow blood cultures Aspiration precautions Consider ID consult  Hypernatremia Dietary consult for assistance with increasing free water via PEG  History of intracranial hemorrhage and subarachnoid hemorrhage 11/29/23 CT head showing interval improvement No acute disease suspected  Coarse tremors Etiology uncertain Neurology was consulted from the ED with recommendation for switching metoprolol to propranolol Received a dose of metoprolol in the ED EEG on 2/19 showed evidence of epileptogenicity and cortical dysfunction arising from left frontal region likely secondary to underlying ICH  Consider neurology to follow  Decubitus ulcer Ulcers on left heel unstageable, stage 3 on sacrum and healed superficial on left hip Decubitus precautions Wound care consult  Acute conjunctivitis, bilateral Cipro drops  S/P percutaneous endoscopic gastrostomy (PEG) tube placement 12/23/2023(HCC) Dietary consult for tube feed management  Protein-calorie malnutrition, severe Dietary consult  Non-insulin dependent type 2 diabetes mellitus (HCC) Continue basal insulin with sliding scale coverage  History of prostate cancer No acute issues suspected  Hypertension Continue amlodipine per tube    DVT prophylaxis: Lovenox  Consults: none  Advance Care Planning:   Code Status: Prior   Family Communication: none  Disposition Plan: Back to previous home environment  Severity of Illness: The appropriate patient status for this patient is OBSERVATION. Observation status is judged to be reasonable and necessary in  order to provide the required intensity of service to ensure the patient's safety. The patient's presenting symptoms, physical exam findings, and initial radiographic and laboratory data in the context of their medical condition is felt to place them at decreased risk for further clinical deterioration. Furthermore, it is anticipated that the patient will be medically stable for discharge from the hospital within 2 midnights of admission.   Author: Andris Baumann, MD 01/07/2024 1:16 AM  For on call review [email or web address].

## 2024-01-07 NOTE — Progress Notes (Signed)
 Pharmacy Antibiotic Note  William Todd is a 73 y.o. male admitted on 01/06/2024 with sepsis/PNA.  Pharmacy has been consulted for Vancomycin and Cefepime  dosing.  Plan: Vancomycin 1250 mg IV, then 1000 mg IV q48h Cefepime 2 g IV q12h  Height: 6' (182.9 cm) Weight: 65.3 kg (143 lb 15.4 oz) IBW/kg (Calculated) : 77.6  Temp (24hrs), Avg:99.4 F (37.4 C), Min:98.3 F (36.8 C), Max:100.4 F (38 C)  Recent Labs  Lab 01/06/24 1512 01/07/24 0018 01/07/24 0025  WBC 13.5*  --   --   CREATININE 1.54*  --   --   LATICACIDVEN  --  2.3* 2.5*    Estimated Creatinine Clearance: 39.5 mL/min (A) (by C-G formula based on SCr of 1.54 mg/dL (H)).    No Known Allergies  Eddie Candle 01/07/2024 1:32 AM

## 2024-01-07 NOTE — Progress Notes (Signed)
 Initial Nutrition Assessment  DOCUMENTATION CODES:   Severe malnutrition in context of chronic illness  INTERVENTION:  Re-initiate tube feeding via PEG Osmolite 1.5 at 50 ml/h (1200 ml per day) Re-initiate at 28ml/hr and increase by 32ml/hr Q8 hours until goal rate achieved Prosource TF20 60 ml daily Free Water Flushes Q6 hours Provides 1880 kcal, 95 gm protein, free water (TF+flush)  Continue MVI w/ minerals daily Continue Banatrol BID, each packet provides 5g soluble fiber  NUTRITION DIAGNOSIS:   Severe Malnutrition related to chronic illness (T2DM, cognitive impairment, hx prostate CA, short term memory issues questionable for dementia) as evidenced by severe fat depletion, severe muscle depletion.  GOAL:  Patient will meet greater than or equal to 90% of their needs  MONITOR:  TF tolerance, Labs, Skin  REASON FOR ASSESSMENT:  Consult Assessment of nutrition requirement/status, Enteral/tube feeding initiation and management  ASSESSMENT:   Pt discharged almost one week ago. Staff at [facility] reported increased tremors as well as fever. Found to have sepsis likely 2/2 aspiration PNA. PMH: CAD, HLD, T2DM, HTN, anxiety, and prostate cancer (radiation 09/29/13-11/25/13). No formal dx of dementia, however with poor short term memory.  Previous Admission 2/16 admitted to [facility] w/ ICH with bilateral [facility] 2/17 Cortrak placed/TF initiated/ pt pulled out Cortrak 2/19 Cortrak replaced/TF re-initiated/EEG 2/25 transferred out of ICU  3/7 - SLP continues to recommend NPO 3/10 - cortrak replaced 3/11 - PEG placed in IR 3/20 - discharged to [facility] (SNF) Current Admission 3/25 admitted, CT head: decrease size/density of left frontal ICH - decreasing SAH; CXR: no PNA 3/26: TF re-initiated via PEG  Pt with prolonged hospitalization from 2/15-3/20 due to subdural hematoma sustained at [facility] s/p discharge from [facility]. Admitted prior to that from 2/2-2/6 due to  mechanical fall at home resulting in L femoral neck fracture requiring L hip arthroplasty. Continues with lean to the left. Tremors on exertion, but not at rest.  Pt discharged most recently, to [facility] SNF, with continuous tube feed orders and recommendations to transition to bolus schedule, if preferred. No family at bedside this morning. Unable to determine if patient was on boluses or continuous feeds s/p previous discharge. Will recommend re-initiation continuously at this time and transition to bolus feedings, if indicated.  No intake to review as he has not been on PO diet  for some time d/t aspiration risk. Mentation also a barrier. Wife endorsed, during previous admission, that patient "could not get enough to eat" at baseline and would consume three meals per day along with one Ensure. Degree of muscle and fat wasting do not suggest this.   Artificial nutrition continues to be indicated 2/2 inability to consume sufficient energy. This is related to mentation and increased estimated needs d/t chronic illness and skin-related breakdown issues.  No documented N/V/C/D. Will monitor bowel frequency and assess the need for bowel regimen, if indicated. Struggled with delayed gastric transit times. Required senna, Miralax, Reglan, and tap water enema. Remains on Banatrol.  Admit/Current Weight: 65.3kg ? pulled forward from previous admission (five days ago)  Weight stable during last two admissions between 65-66kg. UBW unable to be endorsed.   Drains/Lines: LUQ: PEG tube (20Fr) UOP: x24 hours  Free water flushes initiated to aid in decreasing sodium levels. Lactic acid elevated. Blood sugars elevated despite NPO status and no tube feed infusing. Sliding scale and long-acting insulin in place. Will assess trend and recommend modification in insulin regimen, if indicated.  Meds: famotidine, Banatrol, SSI 0-9 Q4,  semglee, MVI,  metoclopramide, IV ABX  Labs: Na+ 147>148 (H) K+ 4.3  (wdl) Hgb 7.9>7.3 (L) Lactic Acid 2.3>2.5 (H) CBGs 207-210 x24 hours A1c 6.1 (11/2023)  NUTRITION - FOCUSED PHYSICAL EXAM:  Flowsheet Row Most Recent Value  Orbital Region Moderate depletion  Upper Arm Region Severe depletion  Thoracic and Lumbar Region Severe depletion  Buccal Region Moderate depletion  Temple Region Moderate depletion  Clavicle Bone Region Severe depletion  Clavicle and Acromion Bone Region Severe depletion  Scapular Bone Region Severe depletion  Dorsal Hand Severe depletion  Patellar Region Severe depletion  Anterior Thigh Region Severe depletion  Posterior Calf Region Moderate depletion  Edema (RD Assessment) None  Hair Reviewed  Eyes Reviewed  Mouth Reviewed  Skin Reviewed  Nails Reviewed    Diet Order:   Diet Order     None    EDUCATION NEEDS:   Not appropriate for education at this time  Skin:  Skin Assessment: Skin Integrity Issues: Skin Integrity Issues:: DTI, Stage II, Stage III DTI: mid coccyx Stage II: sacrum and L hip Stage III: L knee  Last BM:  PTA  Height:  Ht Readings from Last 1 Encounters:  01/06/24 6' (1.829 m)   Weight:  Wt Readings from Last 1 Encounters:  01/06/24 65.3 kg    BMI:  Body mass index is 19.52 kg/m.  Estimated Nutritional Needs:   Kcal:  1800-2000kcal  Protein:  90-100g  Fluid:  >1.8L/day  Myrtie Cruise MS, RD, LDN Registered Dietitian Clinical Nutrition RD Inpatient Contact Info in Amion

## 2024-01-07 NOTE — Assessment & Plan Note (Signed)
 Dietary consult for assistance with increasing free water via PEG

## 2024-01-07 NOTE — Progress Notes (Addendum)
 PROGRESS NOTE        PATIENT DETAILS Name: William Todd Age: 73 y.o. Sex: male Date of Birth: [birth year] Admit Date: 01/06/2024 Admitting Physician Andris Baumann, MD AGT:[id number], Modena Slater, MD  Brief Summary: Patient is a 73 y.o.  male with history of recent hospitalization for ICH (2/15 to 3/20)-discharged to SNF-presented with fever and tremors.  Significant events: 3/25>> admit to [facility]-fever/tremors.  Significant studies: 3/25>> CT head: Decrease size/density of left frontal ICH-decreasing SAH 3/25>> CXR: No PNA.  Significant microbiology data: 3/25>> COVID/influenza/RSV PCR: Negative 3/26>> blood culture: Negative  Procedures: None  Consults: None  Subjective: Chronically ill-awake-has some intention tremor.  No tremor at rest.  Objective: Vitals: Blood pressure 125/77, pulse (!) 108, temperature 98.1 F (36.7 C), temperature source Axillary, resp. rate 19, height 6' (1.829 m), weight 65.3 kg, SpO2 93%.   Exam: Gen Exam:Alert awake-chronically ill-appearing HEENT:atraumatic, normocephalic Chest: B/L clear to auscultation anteriorly CVS:S1S2 regular Abdomen:soft non tender, non distended Extremities:no edema Neurology: Non focal-but with significant generalized weakness. Skin: no rash  Pertinent Labs/Radiology:    Latest Ref Rng & Units 01/07/2024    4:22 AM 01/06/2024    3:12 PM 12/30/2023    5:03 AM  CBC  WBC 4.0 - 10.5 K/uL 10.2  13.5  10.2   Hemoglobin 13.0 - 17.0 g/dL 7.3  7.9  7.6   Hematocrit 39.0 - 52.0 % 24.8  26.4  24.5   Platelets 150 - 400 K/uL 198  239  157     Lab Results  Component Value Date   NA 148 (H) 01/07/2024   K 4.3 01/07/2024   CL 113 (H) 01/07/2024   CO2 24 01/07/2024      Assessment/Plan: Sepsis likely secondary to presumed aspiration pneumonia Low-grade fever overnight-remains tachycardic No other source of infection apparent at this point Stop vancomycin/cefepime-will switch to Unasyn Remain  n.p.o. Monitor closely and follow cultures  Hypernatremia Add free water via PEG tube Repeat electrolytes tomorrow.  Tremor Appears to be intentional-no tremors at rest Recently TSH on 2/15 stable. Could be a physiological response to febrile illness/recent hospitalization Neurology was curbsided by ED-recommendations were to switch metoprolol to propranolol. Need outpatient neurology evaluation if tremors continue after he recovers from his acute illness.  CKD stage IIIa Close to baseline Follow electrolytes periodically  Normocytic anemia Secondary to acute illness Hb close to usual baseline when compared to his most recent hospitalization Follow closely-and transfuse if significant drop.  Recent left frontal ICH/SAH with cerebral edema CT showing interval improvement Continue supportive care  Dysphagia Resume tube feeds per dietitian  DM-2 CBGs stable Semglee 7 units daily + SSI  Recent Labs    01/07/24 0225 01/07/24 0453 01/07/24 0748  GLUCAP 182* 181* 180*     HTN BP stable Amlodipine  HLD Statin  Severe calorie malnutrition Tube feeds Nutrition evaluation  Bilateral conjunctivitis Cipro eyedrops for a few days.  History of prostate cancer Outpatient follow-up with urology/oncology.   Deep Tissue Pressure Injury L heel Unstageable PI L lateral foot  Stage 2 Pressure Injury L hip Unstageable PI sacrum  POA Wound care per wound care RN.  Code status:   Code Status: Limited: Do not attempt resuscitation (DNR) -DNR-LIMITED -Do Not Intubate/DNI    DVT Prophylaxis: enoxaparin (LOVENOX) injection 40 mg Start: 01/07/24 1000  Family Communication: Spouse at bedside  Disposition Plan: Status is: Observation The patient will require care spanning > 2 midnights and should be moved to inpatient because: Severity of illness   Planned Discharge Destination:Skilled nursing facility   Diet: Diet Order     None         Antimicrobial  agents: Anti-infectives (From admission, onward)    Start     Dose/Rate Route Frequency Ordered Stop   01/09/24 1000  vancomycin (VANCOCIN) IVPB 1000 mg/200 mL premix  Status:  Discontinued        1,000 mg 200 mL/hr over 60 Minutes Intravenous Every 48 hours 01/07/24 0140 01/07/24 0858   01/08/24 0500  vancomycin (VANCOCIN) IVPB 1000 mg/200 mL premix        1,000 mg 200 mL/hr over 60 Minutes Intravenous Every 24 hours 01/07/24 0858     01/07/24 0230  metroNIDAZOLE (FLAGYL) IVPB 500 mg        500 mg 100 mL/hr over 60 Minutes Intravenous 2 times daily 01/07/24 0121 01/13/24 2159   01/07/24 0230  vancomycin (VANCOREADY) IVPB 1250 mg/250 mL        1,250 mg 166.7 mL/hr over 90 Minutes Intravenous  Once 01/07/24 0121 01/07/24 0638   01/07/24 0200  ceFEPIme (MAXIPIME) 2 g in sodium chloride 0.9 % 100 mL IVPB        2 g 200 mL/hr over 30 Minutes Intravenous 2 times daily 01/07/24 0121          MEDICATIONS: Scheduled Meds:  amantadine  100 mg Per Tube BID   amLODipine  5 mg Per Tube Daily   atorvastatin  40 mg Per Tube q1800   ciprofloxacin  1 drop Both Eyes Q4H while awake   enoxaparin (LOVENOX) injection  40 mg Subcutaneous Daily   famotidine  20 mg Per Tube BID   fiber supplement (BANATROL TF)  60 mL Per Tube BID   free water  300 mL Per Tube Q6H   Gerhardt's butt cream   Topical BID   insulin aspart  0-9 Units Subcutaneous Q4H   insulin glargine-yfgn  7 Units Subcutaneous QHS   leptospermum manuka honey  1 Application Topical Daily   metoCLOPramide  10 mg Per Tube TID AC & HS   multivitamin with minerals  1 tablet Per Tube Daily   Continuous Infusions:  ceFEPime (MAXIPIME) IV Stopped (01/07/24 0357)   lactated ringers 150 mL/hr (01/07/24 1022)   metronidazole 500 mg (01/07/24 1022)   [START ON 01/08/2024] vancomycin     PRN Meds:.acetaminophen, levalbuterol, ondansetron **OR** ondansetron (ZOFRAN) IV   I have personally reviewed following labs and imaging  studies  LABORATORY DATA: CBC: Recent Labs  Lab 01/06/24 1512 01/07/24 0422  WBC 13.5* 10.2  NEUTROABS 12.0*  --   HGB 7.9* 7.3*  HCT 26.4* 24.8*  MCV 97.8 99.2  PLT 239 198    Basic Metabolic Panel: Recent Labs  Lab 01/06/24 1512 01/07/24 0422  NA 147* 148*  K 4.2 4.3  CL 106 113*  CO2 28 24  GLUCOSE 207* 210*  BUN 34* 27*  CREATININE 1.54* 1.34*  CALCIUM 9.6 9.1  MG 2.0  --     GFR: Estimated Creatinine Clearance: 45.3 mL/min (A) (by C-G formula based on SCr of 1.34 mg/dL (H)).  Liver Function Tests: Recent Labs  Lab 01/06/24 1512 01/07/24 0422  AST 44* 36  ALT 90* 69*  ALKPHOS 143* 132*  BILITOT 0.6 0.8  PROT 6.7 6.3*  ALBUMIN 2.5* 2.3*   No results for input(s): "  LIPASE", "AMYLASE" in the last 168 hours. Recent Labs  Lab 01/06/24 1512  AMMONIA 16    Coagulation Profile: No results for input(s): "INR", "PROTIME" in the last 168 hours.  Cardiac Enzymes: No results for input(s): "CKTOTAL", "CKMB", "CKMBINDEX", "TROPONINI" in the last 168 hours.  BNP (last 3 results) No results for input(s): "PROBNP" in the last 8760 hours.  Lipid Profile: No results for input(s): "CHOL", "HDL", "LDLCALC", "TRIG", "CHOLHDL", "LDLDIRECT" in the last 72 hours.  Thyroid Function Tests: No results for input(s): "TSH", "T4TOTAL", "FREET4", "T3FREE", "THYROIDAB" in the last 72 hours.  Anemia Panel: No results for input(s): "VITAMINB12", "FOLATE", "FERRITIN", "TIBC", "IRON", "RETICCTPCT" in the last 72 hours.  Urine analysis:    Component Value Date/Time   COLORURINE YELLOW 01/06/2024 2340   APPEARANCEUR HAZY (A) 01/06/2024 2340   LABSPEC 1.010 01/06/2024 2340   PHURINE 5.0 01/06/2024 2340   GLUCOSEU >=500 (A) 01/06/2024 2340   HGBUR SMALL (A) 01/06/2024 2340   BILIRUBINUR NEGATIVE 01/06/2024 2340   KETONESUR NEGATIVE 01/06/2024 2340   PROTEINUR 30 (A) 01/06/2024 2340   NITRITE NEGATIVE 01/06/2024 2340   LEUKOCYTESUR NEGATIVE 01/06/2024 2340    Sepsis  Labs: Lactic Acid, Venous    Component Value Date/Time   LATICACIDVEN 2.5 (HH) 01/07/2024 0025    MICROBIOLOGY: Recent Results (from the past 240 hours)  Resp panel by RT-PCR (RSV, Flu A&B, Covid) Anterior Nasal Swab     Status: None   Collection Time: 01/06/24  3:07 PM   Specimen: Anterior Nasal Swab  Result Value Ref Range Status   SARS Coronavirus 2 by RT PCR NEGATIVE NEGATIVE Final   Influenza A by PCR NEGATIVE NEGATIVE Final   Influenza B by PCR NEGATIVE NEGATIVE Final    Comment: (NOTE) The Xpert Xpress SARS-CoV-2/FLU/RSV plus assay is intended as an aid in the diagnosis of influenza from Nasopharyngeal swab specimens and should not be used as a sole basis for treatment. Nasal washings and aspirates are unacceptable for Xpert Xpress SARS-CoV-2/FLU/RSV testing.  Fact Sheet for Patients: BloggerCourse.com  Fact Sheet for Healthcare Providers: SeriousBroker.it  This test is not yet approved or cleared by the [place] FDA and has been authorized for detection and/or diagnosis of SARS-CoV-2 by FDA under an Emergency Use Authorization (EUA). This EUA will remain in effect (meaning this test can be used) for the duration of the COVID-19 declaration under Section 564(b)(1) of the Act, 21 U.S.C. section 360bbb-3(b)(1), unless the authorization is terminated or revoked.     Resp Syncytial Virus by PCR NEGATIVE NEGATIVE Final    Comment: (NOTE) Fact Sheet for Patients: BloggerCourse.com  Fact Sheet for Healthcare Providers: SeriousBroker.it  This test is not yet approved or cleared by the [place] FDA and has been authorized for detection and/or diagnosis of SARS-CoV-2 by FDA under an Emergency Use Authorization (EUA). This EUA will remain in effect (meaning this test can be used) for the duration of the COVID-19 declaration under Section 564(b)(1) of the Act, 21  U.S.C. section 360bbb-3(b)(1), unless the authorization is terminated or revoked.  Performed at [facility], [address] [place], [place] [address]   Culture, blood (routine x 2)     Status: None (Preliminary result)   Collection Time: 01/07/24 12:18 AM   Specimen: BLOOD LEFT ARM  Result Value Ref Range Status   Specimen Description BLOOD LEFT ARM  Final   Special Requests   Final    BOTTLES DRAWN AEROBIC AND ANAEROBIC Blood Culture adequate volume  Culture   Final    NO GROWTH < 12 HOURS Performed at [facility], [address] [place], [place] [address]    Report Status PENDING  Incomplete  Culture, blood (routine x 2)     Status: None (Preliminary result)   Collection Time: 01/07/24 12:20 AM   Specimen: BLOOD  Result Value Ref Range Status   Specimen Description BLOOD RIGHT ANTECUBITAL  Final   Special Requests   Final    BOTTLES DRAWN AEROBIC AND ANAEROBIC Blood Culture adequate volume   Culture   Final    NO GROWTH < 12 HOURS Performed at [facility], [address], [place], [place] [address]    Report Status PENDING  Incomplete    RADIOLOGY STUDIES/RESULTS: CT Head Wo Contrast Result Date: 01/06/2024 CLINICAL DATA:  Tremors EXAM: CT HEAD WITHOUT CONTRAST TECHNIQUE: Contiguous axial images were obtained from the base of the skull through the vertex without intravenous contrast. RADIATION DOSE REDUCTION: This exam was performed according to the departmental dose-optimization program which includes automated exposure control, adjustment of the mA and/or kV according to patient size and/or use of iterative reconstruction technique. COMPARISON:  CT brain 12/12/2023 FINDINGS: Brain: Motion degradation on multiple images limits the exam. Decreasing size of left frontal intraparenchymal hematoma, also appears more hypodense in the interim consistent with interval evolutionary changes. Resolution of intraventricular blood compared to prior. Scattered  subarachnoid hemorrhage not progressive and appears decreased in the interim as well. Residual 3 mm midline shift to the right, decreased from 7 mm previously. Decreased edema surrounding left frontal hematoma. Chronic right occipital infarct. Residual mass effect on left frontal horn but also diminished. Vascular: No hyperdense vessels.  Carotid vascular calcifications Skull: Normal. Negative for fracture or focal lesion. Incompletely visualized bony mass off the right mandible. Sinuses/Orbits: No acute finding. Other: None IMPRESSION: 1. Motion degradation on multiple images limits the exam. Interval decreased size and density of left frontal intraparenchymal hematoma with decreased edema and decreased midline shift to the right compared to prior. Decreasing subarachnoid hemorrhage and interval resolution of intraventricular blood. 2. Chronic right occipital infarct Electronically Signed   By: Jasmine Pang M.D.   On: 01/06/2024 18:45   DG Chest Port 1 View Result Date: 01/06/2024 CLINICAL DATA:  Weakness and tremors. EXAM: PORTABLE CHEST 1 VIEW COMPARISON:  AP chest 12/24/2023 FINDINGS: Cardiac silhouette and mediastinal contours are within normal limits. Mild atherosclerotic calcifications within the aortic arch. The lungs are clear. No pleural effusion or pneumothorax. No acute skeletal abnormality.Dense stool is seen within the partially visualized colon. IMPRESSION: 1.  No acute cardiopulmonary disease process. 2. Dense stool within the partially visualized colon. Electronically Signed   By: Neita Garnet M.D.   On: 01/06/2024 17:59     LOS: 0 days   Jeoffrey Massed, MD  [facility]    To contact the attending provider between 7A-7P or the covering provider during after hours 7P-7A, please log into the web site [email or web address] and access using universal [place] password for that web site. If you do not have the password, please call the hospital operator.  01/07/2024, 11:41 AM

## 2024-01-07 NOTE — Assessment & Plan Note (Addendum)
 Continue amlodipine per tube

## 2024-01-07 NOTE — Progress Notes (Signed)
 Pharmacy Antibiotic Note  William Todd is a 73 y.o. male admitted on 01/06/2024 with sepsis/PNA.  Pharmacy has been consulted for Vancomycin and Cefepime  dosing.  3/26 AM update: Scr 1.54 >> 1.34  Plan: Change Vancomycin 1000 mg IV q24h eAUC 506 scr 1.34 Cefepime 2 g IV q12h  Height: 6' (182.9 cm) Weight: 65.3 kg (143 lb 15.4 oz) IBW/kg (Calculated) : 77.6  Temp (24hrs), Avg:99.2 F (37.3 C), Min:98.3 F (36.8 C), Max:100.4 F (38 C)  Recent Labs  Lab 01/06/24 1512 01/07/24 0018 01/07/24 0025 01/07/24 0422  WBC 13.5*  --   --  10.2  CREATININE 1.54*  --   --  1.34*  LATICACIDVEN  --  2.3* 2.5*  --     Estimated Creatinine Clearance: 45.3 mL/min (A) (by C-G formula based on SCr of 1.34 mg/dL (H)).    No Known Allergies  Micro 3/26 Bcx- ngtd <12H  Destan Franchini BS, PharmD, BCPS Clinical Pharmacist 01/07/2024 8:56 AM  Contact: [phone] after 3 PM  "Be curious, not judgmental..." -Debbora Dus

## 2024-01-07 NOTE — Assessment & Plan Note (Signed)
Continue basal insulin with sliding scale coverage

## 2024-01-07 NOTE — Assessment & Plan Note (Addendum)
 Febrile illness, suspect aspiration pneumonitis Sepsis criteria include fever, tachycardia and tachypnea, leukocytosis with lactic acidosis Source uncertain but suspect aspiration pneumonia versus aspiration pneumonitis-were treated empirically with Unasyn during recent hospitalization Respiratory viral panel negative, urinalysis unremarkable Continue sepsis fluids Empiric antibiotics for sepsis of unknown source Follow blood cultures Aspiration precautions Consider ID consult

## 2024-01-07 NOTE — Consult Note (Signed)
 WOC Nurse Consult Note: Reason for Consult: multiple pressure injuries  Wound type: 1.  Deep Tissue Pressure Injury L heel 2.  Unstageable PI L lateral foot  3.  Stage 2 Pressure Injury L hip 4. Unstageable PI sacrum  Pressure Injury POA: Yes Measurement: see nursing flowsheet  Wound bed: 1.  DTPI L heel purple maroon discoloration skin intact  2.  Left lateral foot Unstageable pressure injury 100% eschar  3.  Stage 2 L hip pink and dry  4.  Unstageable PI sacrum 100% yellow necrotic tissue  Drainage (amount, consistency, odor) see nursing flowsheet  Periwound:peeling pink epithelium around sacral wound  Dressing procedure/placement/frequency: Cleanse L heel and lateral foot wounds with soap and water, dry and apply Xeroform gauze [place] [phone]) to wound beds daily. Cover with dry gauze and Kerlix roll gauze or silicone foam whichever is desired. Foot should be placed in a prevalon boot to offload pressure [place] [phone]).  Apply silicone foam to left hip wound, lift daily to assess area.  Cleanse sacral wound with Vashe wound cleanser [place] [phone]), do not rinse and allow to air dry. Apply Medihoney to wound bed daily, cover with dry gauze. Coat surrounding skin with Gerhardt's Butt Cream 2 times daily and prn soiling.  Cover with ABD pad or silicone foam whichever is preferred.   Patient should be placed on a low air mattress for pressure redistribution and moisture management.   POC discussed with bedside nurse. WOC team will not follow. Re-consult if further needs arise.   Thank you,    Priscella Mann MSN, RN-BC, [employer] [phone]

## 2024-01-08 ENCOUNTER — Inpatient Hospital Stay (HOSPITAL_COMMUNITY)

## 2024-01-08 DIAGNOSIS — E870 Hyperosmolality and hypernatremia: Secondary | ICD-10-CM | POA: Diagnosis not present

## 2024-01-08 DIAGNOSIS — A419 Sepsis, unspecified organism: Secondary | ICD-10-CM | POA: Diagnosis not present

## 2024-01-08 DIAGNOSIS — Z8679 Personal history of other diseases of the circulatory system: Secondary | ICD-10-CM | POA: Diagnosis not present

## 2024-01-08 DIAGNOSIS — R569 Unspecified convulsions: Secondary | ICD-10-CM

## 2024-01-08 DIAGNOSIS — G252 Other specified forms of tremor: Secondary | ICD-10-CM | POA: Diagnosis not present

## 2024-01-08 LAB — CBC
HCT: 28.5 % — ABNORMAL LOW (ref 39.0–52.0)
HCT: 27.9 % — ABNORMAL LOW (ref 39.0–52.0)
Hemoglobin: 8.7 g/dL — ABNORMAL LOW (ref 13.0–17.0)
Hemoglobin: 8.2 g/dL — ABNORMAL LOW (ref 13.0–17.0)
MCH: 29 pg (ref 26.0–34.0)
MCH: 28.7 pg (ref 26.0–34.0)
MCHC: 30.5 g/dL (ref 30.0–36.0)
MCHC: 29.4 g/dL — ABNORMAL LOW (ref 30.0–36.0)
MCV: 95 fL (ref 80.0–100.0)
MCV: 97.6 fL (ref 80.0–100.0)
Platelets: 200 10*3/uL (ref 150–400)
Platelets: 199 10*3/uL (ref 150–400)
RBC: 3 MIL/uL — ABNORMAL LOW (ref 4.22–5.81)
RBC: 2.86 MIL/uL — ABNORMAL LOW (ref 4.22–5.81)
RDW: 16.9 % — ABNORMAL HIGH (ref 11.5–15.5)
RDW: 16.8 % — ABNORMAL HIGH (ref 11.5–15.5)
WBC: 11.6 10*3/uL — ABNORMAL HIGH (ref 4.0–10.5)
WBC: 11.6 10*3/uL — ABNORMAL HIGH (ref 4.0–10.5)
nRBC: 0 % (ref 0.0–0.2)
nRBC: 0 % (ref 0.0–0.2)

## 2024-01-08 LAB — COMPREHENSIVE METABOLIC PANEL WITH GFR
ALT: 57 U/L — ABNORMAL HIGH (ref 0–44)
AST: 36 U/L (ref 15–41)
Albumin: 2.5 g/dL — ABNORMAL LOW (ref 3.5–5.0)
Alkaline Phosphatase: 139 U/L — ABNORMAL HIGH (ref 38–126)
Anion gap: 11 (ref 5–15)
BUN: 18 mg/dL (ref 8–23)
CO2: 25 mmol/L (ref 22–32)
Calcium: 9.5 mg/dL (ref 8.9–10.3)
Chloride: 114 mmol/L — ABNORMAL HIGH (ref 98–111)
Creatinine, Ser: 1.36 mg/dL — ABNORMAL HIGH (ref 0.61–1.24)
GFR, Estimated: 55 mL/min — ABNORMAL LOW (ref >60)
Glucose, Bld: 103 mg/dL — ABNORMAL HIGH (ref 70–99)
Potassium: 3.5 mmol/L (ref 3.5–5.1)
Sodium: 150 mmol/L — ABNORMAL HIGH (ref 135–145)
Total Bilirubin: 0.7 mg/dL (ref 0.0–1.2)
Total Protein: 6.6 g/dL (ref 6.5–8.1)

## 2024-01-08 LAB — GLUCOSE, CAPILLARY
Glucose-Capillary: 102 mg/dL — ABNORMAL HIGH (ref 70–99)
Glucose-Capillary: 130 mg/dL — ABNORMAL HIGH (ref 70–99)
Glucose-Capillary: 136 mg/dL — ABNORMAL HIGH (ref 70–99)
Glucose-Capillary: 190 mg/dL — ABNORMAL HIGH (ref 70–99)
Glucose-Capillary: 215 mg/dL — ABNORMAL HIGH (ref 70–99)
Glucose-Capillary: 220 mg/dL — ABNORMAL HIGH (ref 70–99)

## 2024-01-08 LAB — PHOSPHORUS: Phosphorus: 2.7 mg/dL (ref 2.5–4.6)

## 2024-01-08 LAB — PROCALCITONIN: Procalcitonin: 0.31 ng/mL

## 2024-01-08 LAB — MAGNESIUM: Magnesium: 1.8 mg/dL (ref 1.7–2.4)

## 2024-01-08 MED ORDER — SENNOSIDES-DOCUSATE SODIUM 8.6-50 MG PO TABS
2.0000 | ORAL_TABLET | Freq: Every day | ORAL | Status: DC
  Administered 2024-01-08 – 2024-01-09 (×2): 2 via ORAL
  Filled 2024-01-08 (×3): qty 2

## 2024-01-08 MED ORDER — AMLODIPINE BESYLATE 5 MG PO TABS
2.5000 mg | ORAL_TABLET | Freq: Every day | ORAL | Status: DC
  Filled 2024-01-08: qty 1

## 2024-01-08 MED ORDER — BISACODYL 10 MG RE SUPP
10.0000 mg | Freq: Every day | RECTAL | Status: DC | PRN
  Administered 2024-01-11: 10 mg via RECTAL
  Filled 2024-01-08: qty 1

## 2024-01-08 MED ORDER — PROPRANOLOL HCL 10 MG PO TABS
40.0000 mg | ORAL_TABLET | Freq: Two times a day (BID) | ORAL | Status: DC
  Administered 2024-01-08 – 2024-01-10 (×5): 40 mg via ORAL
  Filled 2024-01-08 (×6): qty 4

## 2024-01-08 MED ORDER — POLYETHYLENE GLYCOL 3350 17 G PO PACK
17.0000 g | PACK | Freq: Every day | ORAL | Status: DC
  Administered 2024-01-08 – 2024-01-10 (×3): 17 g via ORAL
  Filled 2024-01-08 (×3): qty 1

## 2024-01-08 MED ORDER — METOPROLOL TARTRATE 5 MG/5ML IV SOLN
2.5000 mg | INTRAVENOUS | Status: DC | PRN
  Administered 2024-01-08: 2.5 mg via INTRAVENOUS
  Filled 2024-01-08: qty 5

## 2024-01-08 MED ORDER — SODIUM CHLORIDE 0.45 % IV SOLN: INTRAVENOUS | Status: AC

## 2024-01-08 MED ORDER — FREE WATER
400.0000 mL | Freq: Four times a day (QID) | Status: DC
  Administered 2024-01-08 – 2024-01-09 (×4): 400 mL

## 2024-01-08 MED ORDER — INSULIN ASPART 100 UNIT/ML IJ SOLN
0.0000 [IU] | INTRAMUSCULAR | Status: DC
  Administered 2024-01-08: 2 [IU] via SUBCUTANEOUS
  Administered 2024-01-08: 3 [IU] via SUBCUTANEOUS
  Administered 2024-01-08 – 2024-01-09 (×2): 1 [IU] via SUBCUTANEOUS
  Administered 2024-01-09: 7 [IU] via SUBCUTANEOUS
  Administered 2024-01-09 (×2): 1 [IU] via SUBCUTANEOUS
  Administered 2024-01-09: 9 [IU] via SUBCUTANEOUS
  Administered 2024-01-10 (×2): 2 [IU] via SUBCUTANEOUS
  Administered 2024-01-10: 1 [IU] via SUBCUTANEOUS
  Administered 2024-01-10: 7 [IU] via SUBCUTANEOUS
  Administered 2024-01-10: 2 [IU] via SUBCUTANEOUS
  Administered 2024-01-11: 3 [IU] via SUBCUTANEOUS
  Administered 2024-01-11: 2 [IU] via SUBCUTANEOUS
  Administered 2024-01-11 (×2): 5 [IU] via SUBCUTANEOUS
  Administered 2024-01-11: 3 [IU] via SUBCUTANEOUS
  Administered 2024-01-11: 5 [IU] via SUBCUTANEOUS
  Administered 2024-01-12: 7 [IU] via SUBCUTANEOUS

## 2024-01-08 NOTE — Plan of Care (Signed)

## 2024-01-08 NOTE — Evaluation (Signed)
 Physical Therapy Evaluation Patient Details Name: William Todd MRN: [id number] DOB: Aug 13, 1951 Today's Date: 01/08/2024  History of Present Illness  William Todd is a 73 y.o. male who presented 3/25 from SNF with  fever and tremors. Sepsis likely secondary to presumed aspiration pneumonia. PMHx includes recent ICH, HTN, recurrent mechanical falls, protein calorie malnutrition, DM2, CKD, and prostate cancer 2014, L foot fx with AFO,  L THA 11/16/23, anxiety, dementia.  Clinical Impression  Pt admitted with above diagnosis. Information obtained from sister, reportedly working with therapy at SNF and has been getting out of bed to sit in a chair. Pt required total assist for rolling, sidelying to sit, and max assist to lower back into bed. Strong retropulsion initially. Progressed to min assist for seated balance EOB. Tolerated NMRE and seated balance, gentle AAROM exercises for neck. SpO2 mid 90s when demonstrating good waveform on monitor. Repositioned with better alignment end of session. Pt currently with functional limitations due to the deficits listed below (see PT Problem List). Pt will benefit from acute skilled PT to increase their independence and safety with mobility to allow discharge.           If plan is discharge home, recommend the following: Two people to help with walking and/or transfers;Two people to help with bathing/dressing/bathroom   Can travel by private vehicle   No    Equipment Recommendations None recommended by PT  Recommendations for Other Services       Functional Status Assessment Patient has had a recent decline in their functional status and demonstrates the ability to make significant improvements in function in a reasonable and predictable amount of time.     Precautions / Restrictions Precautions Precautions: Fall Recall of Precautions/Restrictions: Impaired Precaution/Restrictions Comments: seizure Required Braces or Orthoses: Other Brace Other  Brace: L AFO Restrictions Weight Bearing Restrictions Per Provider Order: No      Mobility  Bed Mobility Overal bed mobility: Needs Assistance Bed Mobility: Sidelying to Sit, Rolling, Sit to Sidelying Rolling: Total assist Sidelying to sit: Total assist, HOB elevated     Sit to sidelying: Max assist General bed mobility comments: Total assist to rise with heavy posterior retropulsion tone. Gradually relaxes. Multimodal cues. Max assist to guide down to sidelying, able to assist with lean.    Transfers                   General transfer comment: Did not attempt on this date    Ambulation/Gait               General Gait Details: Unable at this time.  Stairs            Wheelchair Mobility     Tilt Bed    Modified Rankin (Stroke Patients Only)       Balance Overall balance assessment: Needs assistance Sitting-balance support: Bilateral upper extremity supported, Feet supported Sitting balance-Leahy Scale: Zero Sitting balance - Comments: Progressed to min assist for balance on EOB. Tolerate >5 min EOB working on weight shift, able to pass midline towards Rt side, but has heavy left bias.                                     Pertinent Vitals/Pain Pain Assessment Pain Assessment: PAINAD Breathing: occasional labored breathing, short period of hyperventilation Negative Vocalization: none Facial Expression: sad, frightened, frown Body Language: rigid, fists clenched, knees up, pushing/pulling  away, strikes out Consolability: distracted or reassured by voice/touch PAINAD Score: 5 Pain Intervention(s): Monitored during session, Repositioned    Home Living Family/patient expects to be discharged to:: Skilled nursing facility Living Arrangements: Spouse/significant other Available Help at Discharge: Family Type of Home: House Home Access: Stairs to enter Entrance Stairs-Rails: Doctor, general practice of Steps: 1 from the  garage into the house Alternate Level Stairs-Number of Steps: 13 upstiars to bed/bath. Does have a murphy bed downstairs in a sunken room - 1 step down. Home Layout: Two level;1/2 bath on main level;Able to live on main level with bedroom/bathroom Home Equipment: Cane - single point;Rolling Walker (2 wheels) Additional Comments: info from prior admission.    Prior Function Prior Level of Function : Needs assist             Mobility Comments: Has been up in recliner at SNF ADLs Comments: total assist.     Extremity/Trunk Assessment   Upper Extremity Assessment Upper Extremity Assessment: Defer to OT evaluation    Lower Extremity Assessment Lower Extremity Assessment: Difficult to assess due to impaired cognition    Cervical / Trunk Assessment Cervical / Trunk Assessment: Other exceptions Cervical / Trunk Exceptions: Lt rotation, able to bring to neutral with gentle overpressure.  Communication   Communication Communication: Impaired Factors Affecting Communication: Difficulty expressing self    Cognition Arousal: Alert Behavior During Therapy: Flat affect   PT - Cognitive impairments: Difficult to assess Difficult to assess due to: Impaired communication                     PT - Cognition Comments: Able to verbalize his name with great effot. Following commands: Impaired Following commands impaired: Follows one step commands inconsistently     Cueing Cueing Techniques: Verbal cues, Gestural cues, Tactile cues, Visual cues     General Comments General comments (skin integrity, edema, etc.): SpO2 mid 90s when good waveform present. on RA.    Exercises Other Exercises Other Exercises: AAROM cervical ROM, SCM and upper trap. Other Exercises: Seated balance placing hands on Rails and bed as tolerated. Unable to fully open Rt hand but grasped rail with finger.   Assessment/Plan    PT Assessment Patient needs continued PT services  PT Problem List  Decreased strength;Decreased activity tolerance;Decreased balance;Decreased range of motion;Decreased mobility;Decreased cognition;Decreased knowledge of use of DME;Decreased safety awareness;Decreased knowledge of precautions;Pain;Decreased coordination;Impaired tone       PT Treatment Interventions DME instruction;Gait training;Stair training;Functional mobility training;Therapeutic activities;Therapeutic exercise;Balance training;Patient/family education;Neuromuscular re-education;Cognitive remediation;Wheelchair mobility training    PT Goals (Current goals can be found in the Care Plan section)  Acute Rehab PT Goals Patient Stated Goal: n/a PT Goal Formulation: All assessment and education complete, DC therapy Time For Goal Achievement: 01/22/24 Potential to Achieve Goals: Poor    Frequency Min 1X/week     Co-evaluation               AM-PAC PT "6 Clicks" Mobility  Outcome Measure Help needed turning from your back to your side while in a flat bed without using bedrails?: Total Help needed moving from lying on your back to sitting on the side of a flat bed without using bedrails?: Total Help needed moving to and from a bed to a chair (including a wheelchair)?: Total Help needed standing up from a chair using your arms (e.g., wheelchair or bedside chair)?: Total Help needed to walk in hospital room?: Total Help needed climbing 3-5 steps with a railing? :  Total 6 Click Score: 6    End of Session Equipment Utilized During Treatment: Gait belt Activity Tolerance: Patient tolerated treatment well Patient left: in bed;with call bell/phone within reach;with bed alarm set;with family/visitor present   PT Visit Diagnosis: Difficulty in walking, not elsewhere classified (R26.2);Muscle weakness (generalized) (M62.81);History of falling (Z91.81);Other symptoms and signs involving the nervous system (R29.898);Apraxia (R48.2)    Time: 1610-9604 PT Time Calculation (min) (ACUTE ONLY):  19 min   Charges:   PT Evaluation $PT Eval High Complexity: 1 High   PT General Charges $$ ACUTE PT VISIT: 1 Visit         Kathlyn [place], PT, DPT [facility]  Rehabilitation Services Physical Therapist Office: [phone] Website: [email or web address]   William Todd 01/08/2024, 4:40 PM

## 2024-01-08 NOTE — Procedures (Signed)
 Patient Name: William Todd  MRN: [id number]  Epilepsy Attending: Charlsie Quest  Referring Physician/Provider: Maretta Bees, MD  Date: 01/07/2024 Duration: 22.47 mins  Patient history: 73yo M with left frontal ICH getting eeg to evaluate for seizure  Level of alertness: Awake  AEDs during EEG study: None  Technical aspects: This EEG study was done with scalp electrodes positioned according to the 10-20 International system of electrode placement. Electrical activity was reviewed with band pass filter of 1-70Hz , sensitivity of 7 uV/mm, display speed of 15mm/sec with a 60Hz  notched filter applied as appropriate. EEG data were recorded continuously and digitally stored.  Video monitoring was available and reviewed as appropriate.  Description: The posterior dominant rhythm consists of 8 Hz activity of moderate voltage (25-35 uV) seen predominantly in posterior head regions, symmetric and reactive to eye opening and eye closing. EEG showed intermittent generalized 5 to 6 Hz theta slowing. Hyperventilation and photic stimulation were not performed.     ABNORMALITY - Intermittent slow, generalized  IMPRESSION: This study is suggestive of mild diffuse encephalopathy. No seizures or epileptiform discharges were seen throughout the recording.  [name]

## 2024-01-08 NOTE — Progress Notes (Signed)
 PROGRESS NOTE        PATIENT DETAILS Name: William Todd Age: 73 y.o. Sex: male Date of Birth: [birth year] Admit Date: 01/06/2024 Admitting Physician Dewayne Shorter Levora Dredge, MD [id number], Modena Slater, MD  Brief Summary: Patient is a 73 y.o.  male with history of recent hospitalization for ICH (2/15 to 3/20)-discharged to SNF-presented with fever and tremors.  Significant events: 3/25>> admit to [facility]-fever/tremors.  Significant studies: 3/25>> CT head: Decrease size/density of left frontal ICH-decreasing SAH 3/25>> CXR: No PNA.  Significant microbiology data: 3/25>> COVID/influenza/RSV PCR: Negative 3/26>> blood culture: Negative  Procedures: None  Consults: None  Subjective: No fever overnight-awake-appears very weak-still with some tremors.  Objective: Vitals: Blood pressure (!) 143/91, pulse 92, temperature 98.5 F (36.9 C), temperature source Axillary, resp. rate (!) 22, height 6' (1.829 m), weight 65.3 kg, SpO2 96%.   Exam: Gen Exam: Frail-but awake/alert.  Continues to have some tremors. HEENT:atraumatic, normocephalic Chest: B/L clear to auscultation anteriorly CVS:S1S2 regular Abdomen:soft non tender, non distended Extremities:no edema Neurology: Moves all 4 extremities-but with generalized weakness. Skin: no rash  Pertinent Labs/Radiology:    Latest Ref Rng & Units 01/08/2024    7:48 AM 01/08/2024    5:20 AM 01/07/2024    4:22 AM  CBC  WBC 4.0 - 10.5 K/uL 11.6  11.6  10.2   Hemoglobin 13.0 - 17.0 g/dL 8.2  8.7  7.3   Hematocrit 39.0 - 52.0 % 27.9  28.5  24.8   Platelets 150 - 400 K/uL 199  200  198     Lab Results  Component Value Date   NA 150 (H) 01/08/2024   K 3.5 01/08/2024   CL 114 (H) 01/08/2024   CO2 25 01/08/2024      Assessment/Plan: Sepsis likely secondary to presumed aspiration pneumonia Slowly improving-no fever overnight Presumed aspiration pneumonia-given clinical symptomatology-although chest x-ray does not  show any overt infiltrates.  Other possibility is that this could be central fever.  No other sources of infection apparent-all cultures negative. Continue Unasyn for a few more days Follow clinical course.  Hypernatremia Continues to have persistent hyponatremia Increase free water via PEG tube to 400 cc every 6 hours Repeat electrolytes tomorrow.  Tremor Appears to be intentional-no tremors at rest Recently TSH on 2/15 stable. Could be a physiological response to febrile illness/recent hospitalization Neurology was curbsided by ED-recommendations were to switch metoprolol to propranolol. Need outpatient neurology evaluation if tremors continue after he recovers from his acute illness.  CKD stage IIIa Close to baseline Follow electrolytes periodically  Normocytic anemia Secondary to acute illness Hb close to usual baseline when compared to his most recent hospitalization Follow closely-and transfuse if significant drop.  Recent left frontal ICH/SAH with cerebral edema CT showing interval improvement Continue supportive care  Dysphagia Continue tube feeds.  DM-2 CBGs stable Semglee 7 units daily + SSI  Recent Labs    01/08/24 0537 01/08/24 0755 01/08/24 1128  GLUCAP 220* 102* 215*     HTN BP stable Amlodipine/propranolol.  HLD Statin  Severe calorie malnutrition Tube feeds Nutrition evaluation  Bilateral conjunctivitis Cipro eyedrops for a few days.  History of prostate cancer Outpatient follow-up with urology/oncology.   Deep Tissue Pressure Injury L heel Unstageable PI L lateral foot  Stage 2 Pressure Injury L hip Unstageable PI sacrum  POA Wound care per wound care RN.  Palliative  care DNR in place Slowly improving-multiple medical issues as outlined above-not a great candidate for aggressive care-if no improvement with above measures-May need goals of care discussion over the next several days.  Code status:   Code Status: Limited: Do not  attempt resuscitation (DNR) -DNR-LIMITED -Do Not Intubate/DNI    DVT Prophylaxis: enoxaparin (LOVENOX) injection 40 mg Start: 01/07/24 1000  Family Communication: Spouse at bedside on 3/27.   Disposition Plan: Status is: Observation The patient will require care spanning > 2 midnights and should be moved to inpatient because: Severity of illness   Planned Discharge Destination:Skilled nursing facility   Diet: Diet Order     None         Antimicrobial agents: Anti-infectives (From admission, onward)    Start     Dose/Rate Route Frequency Ordered Stop   01/09/24 1000  vancomycin (VANCOCIN) IVPB 1000 mg/200 mL premix  Status:  Discontinued        1,000 mg 200 mL/hr over 60 Minutes Intravenous Every 48 hours 01/07/24 0140 01/07/24 0858   01/08/24 0500  vancomycin (VANCOCIN) IVPB 1000 mg/200 mL premix  Status:  Discontinued        1,000 mg 200 mL/hr over 60 Minutes Intravenous Every 24 hours 01/07/24 0858 01/07/24 1144   01/07/24 1300  Ampicillin-Sulbactam (UNASYN) 3 g in sodium chloride 0.9 % 100 mL IVPB        3 g 200 mL/hr over 30 Minutes Intravenous Every 6 hours 01/07/24 1212     01/07/24 0230  metroNIDAZOLE (FLAGYL) IVPB 500 mg  Status:  Discontinued        500 mg 100 mL/hr over 60 Minutes Intravenous 2 times daily 01/07/24 0121 01/07/24 1154   01/07/24 0230  vancomycin (VANCOREADY) IVPB 1250 mg/250 mL        1,250 mg 166.7 mL/hr over 90 Minutes Intravenous  Once 01/07/24 0121 01/07/24 0638   01/07/24 0200  ceFEPIme (MAXIPIME) 2 g in sodium chloride 0.9 % 100 mL IVPB  Status:  Discontinued        2 g 200 mL/hr over 30 Minutes Intravenous 2 times daily 01/07/24 0121 01/07/24 1154        MEDICATIONS: Scheduled Meds:  amantadine  100 mg Per Tube BID   amLODipine  5 mg Per Tube Daily   atorvastatin  40 mg Per Tube q1800   ciprofloxacin  1 drop Both Eyes Q4H while awake   enoxaparin (LOVENOX) injection  40 mg Subcutaneous Daily   famotidine  20 mg Per Tube BID    feeding supplement (PROSource TF20)  60 mL Per Tube Daily   fiber supplement (BANATROL TF)  60 mL Per Tube BID   free water  300 mL Per Tube Q6H   Gerhardt's butt cream   Topical BID   insulin aspart  0-9 Units Subcutaneous Q4H   insulin glargine-yfgn  7 Units Subcutaneous QHS   leptospermum manuka honey  1 Application Topical Daily   metoCLOPramide  10 mg Per Tube TID AC & HS   multivitamin with minerals  1 tablet Per Tube Daily   polyethylene glycol  17 g Oral Daily   propranolol  40 mg Oral BID   senna-docusate  2 tablet Oral QHS   Continuous Infusions:  ampicillin-sulbactam (UNASYN) IV 3 g (01/08/24 0922)   feeding supplement (OSMOLITE 1.5 CAL) 30 mL/hr at 01/08/24 0726   PRN Meds:.acetaminophen, bisacodyl, levalbuterol, LORazepam, metoprolol tartrate, ondansetron **OR** ondansetron (ZOFRAN) IV   I have personally reviewed following labs and  imaging studies  LABORATORY DATA: CBC: Recent Labs  Lab 01/06/24 1512 01/07/24 0422 01/08/24 0520 01/08/24 0748  WBC 13.5* 10.2 11.6* 11.6*  NEUTROABS 12.0*  --   --   --   HGB 7.9* 7.3* 8.7* 8.2*  HCT 26.4* 24.8* 28.5* 27.9*  MCV 97.8 99.2 95.0 97.6  PLT 239 198 200 199    Basic Metabolic Panel: Recent Labs  Lab 01/06/24 1512 01/07/24 0422 01/08/24 0748  NA 147* 148* 150*  K 4.2 4.3 3.5  CL 106 113* 114*  CO2 28 24 25   GLUCOSE 207* 210* 103*  BUN 34* 27* 18  CREATININE 1.54* 1.34* 1.36*  CALCIUM 9.6 9.1 9.5  MG 2.0  --  1.8  PHOS  --   --  2.7    GFR: Estimated Creatinine Clearance: 44.7 mL/min (A) (by C-G formula based on SCr of 1.36 mg/dL (H)).  Liver Function Tests: Recent Labs  Lab 01/06/24 1512 01/07/24 0422 01/08/24 0748  AST 44* 36 36  ALT 90* 69* 57*  ALKPHOS 143* 132* 139*  BILITOT 0.6 0.8 0.7  PROT 6.7 6.3* 6.6  ALBUMIN 2.5* 2.3* 2.5*   No results for input(s): "LIPASE", "AMYLASE" in the last 168 hours. Recent Labs  Lab 01/06/24 1512  AMMONIA 16    Coagulation Profile: No results for  input(s): "INR", "PROTIME" in the last 168 hours.  Cardiac Enzymes: No results for input(s): "CKTOTAL", "CKMB", "CKMBINDEX", "TROPONINI" in the last 168 hours.  BNP (last 3 results) No results for input(s): "PROBNP" in the last 8760 hours.  Lipid Profile: No results for input(s): "CHOL", "HDL", "LDLCALC", "TRIG", "CHOLHDL", "LDLDIRECT" in the last 72 hours.  Thyroid Function Tests: No results for input(s): "TSH", "T4TOTAL", "FREET4", "T3FREE", "THYROIDAB" in the last 72 hours.  Anemia Panel: No results for input(s): "VITAMINB12", "FOLATE", "FERRITIN", "TIBC", "IRON", "RETICCTPCT" in the last 72 hours.  Urine analysis:    Component Value Date/Time   COLORURINE YELLOW 01/06/2024 2340   APPEARANCEUR HAZY (A) 01/06/2024 2340   LABSPEC 1.010 01/06/2024 2340   PHURINE 5.0 01/06/2024 2340   GLUCOSEU >=500 (A) 01/06/2024 2340   HGBUR SMALL (A) 01/06/2024 2340   BILIRUBINUR NEGATIVE 01/06/2024 2340   KETONESUR NEGATIVE 01/06/2024 2340   PROTEINUR 30 (A) 01/06/2024 2340   NITRITE NEGATIVE 01/06/2024 2340   LEUKOCYTESUR NEGATIVE 01/06/2024 2340    Sepsis Labs: Lactic Acid, Venous    Component Value Date/Time   LATICACIDVEN 2.5 (HH) 01/07/2024 0025    MICROBIOLOGY: Recent Results (from the past 240 hours)  Resp panel by RT-PCR (RSV, Flu A&B, Covid) Anterior Nasal Swab     Status: None   Collection Time: 01/06/24  3:07 PM   Specimen: Anterior Nasal Swab  Result Value Ref Range Status   SARS Coronavirus 2 by RT PCR NEGATIVE NEGATIVE Final   Influenza A by PCR NEGATIVE NEGATIVE Final   Influenza B by PCR NEGATIVE NEGATIVE Final    Comment: (NOTE) The Xpert Xpress SARS-CoV-2/FLU/RSV plus assay is intended as an aid in the diagnosis of influenza from Nasopharyngeal swab specimens and should not be used as a sole basis for treatment. Nasal washings and aspirates are unacceptable for Xpert Xpress SARS-CoV-2/FLU/RSV testing.  Fact Sheet for  Patients: BloggerCourse.com  Fact Sheet for Healthcare Providers: SeriousBroker.it  This test is not yet approved or cleared by the [place] FDA and has been authorized for detection and/or diagnosis of SARS-CoV-2 by FDA under an Emergency Use Authorization (EUA). This EUA will remain in effect (meaning this test  can be used) for the duration of the COVID-19 declaration under Section 564(b)(1) of the Act, 21 U.S.C. section 360bbb-3(b)(1), unless the authorization is terminated or revoked.     Resp Syncytial Virus by PCR NEGATIVE NEGATIVE Final    Comment: (NOTE) Fact Sheet for Patients: BloggerCourse.com  Fact Sheet for Healthcare Providers: SeriousBroker.it  This test is not yet approved or cleared by the [place] FDA and has been authorized for detection and/or diagnosis of SARS-CoV-2 by FDA under an Emergency Use Authorization (EUA). This EUA will remain in effect (meaning this test can be used) for the duration of the COVID-19 declaration under Section 564(b)(1) of the Act, 21 U.S.C. section 360bbb-3(b)(1), unless the authorization is terminated or revoked.  Performed at [facility], [address], [place], [place] [address]   Culture, blood (routine x 2)     Status: None (Preliminary result)   Collection Time: 01/07/24 12:18 AM   Specimen: BLOOD LEFT ARM  Result Value Ref Range Status   Specimen Description BLOOD LEFT ARM  Final   Special Requests   Final    BOTTLES DRAWN AEROBIC AND ANAEROBIC Blood Culture adequate volume   Culture   Final    NO GROWTH < 12 HOURS Performed at [facility], [address], [place], [place] [address]    Report Status PENDING  Incomplete  Culture, blood (routine x 2)     Status: None (Preliminary result)   Collection Time: 01/07/24 12:20 AM   Specimen: BLOOD  Result Value Ref Range Status   Specimen Description  BLOOD RIGHT ANTECUBITAL  Final   Special Requests   Final    BOTTLES DRAWN AEROBIC AND ANAEROBIC Blood Culture adequate volume   Culture   Final    NO GROWTH < 12 HOURS Performed at [facility], [address], [place], [place] [address]    Report Status PENDING  Incomplete    RADIOLOGY STUDIES/RESULTS: DG Chest Port 1 View Result Date: 01/08/2024 CLINICAL DATA:  Shortness of breath. EXAM: PORTABLE CHEST 1 VIEW COMPARISON:  Chest radiograph dated 01/06/2024. FINDINGS: No focal consolidation, pleural effusion, or pneumothorax. The cardiac silhouette is within normal limits. No acute osseous pathology. IMPRESSION: No active disease. Electronically Signed   By: Elgie Collard M.D.   On: 01/08/2024 11:17   EEG adult Result Date: 01/08/2024 Charlsie Quest, MD     01/08/2024  8:24 AM Patient Name: William Todd MRN: [id number] Epilepsy Attending: Charlsie Quest Referring Physician/Provider: Maretta Bees, MD Date: 01/07/2024 Duration: 22.47 mins Patient history: 73yo M with left frontal ICH getting eeg to evaluate for seizure Level of alertness: Awake AEDs during EEG study: None Technical aspects: This EEG study was done with scalp electrodes positioned according to the 10-20 International system of electrode placement. Electrical activity was reviewed with band pass filter of 1-70Hz , sensitivity of 7 uV/mm, display speed of 76mm/sec with a 60Hz  notched filter applied as appropriate. EEG data were recorded continuously and digitally stored.  Video monitoring was available and reviewed as appropriate. Description: The posterior dominant rhythm consists of 8 Hz activity of moderate voltage (25-35 uV) seen predominantly in posterior head regions, symmetric and reactive to eye opening and eye closing. EEG showed intermittent generalized 5 to 6 Hz theta slowing. Hyperventilation and photic stimulation were not performed.   ABNORMALITY - Intermittent slow, generalized IMPRESSION: This study is  suggestive of mild diffuse encephalopathy. No seizures or epileptiform discharges were seen throughout the recording. Priyanka  Annabelle Harman   CT Head Wo Contrast Result Date: 01/06/2024 CLINICAL DATA:  Tremors EXAM: CT HEAD WITHOUT CONTRAST TECHNIQUE: Contiguous axial images were obtained from the base of the skull through the vertex without intravenous contrast. RADIATION DOSE REDUCTION: This exam was performed according to the departmental dose-optimization program which includes automated exposure control, adjustment of the mA and/or kV according to patient size and/or use of iterative reconstruction technique. COMPARISON:  CT brain 12/12/2023 FINDINGS: Brain: Motion degradation on multiple images limits the exam. Decreasing size of left frontal intraparenchymal hematoma, also appears more hypodense in the interim consistent with interval evolutionary changes. Resolution of intraventricular blood compared to prior. Scattered subarachnoid hemorrhage not progressive and appears decreased in the interim as well. Residual 3 mm midline shift to the right, decreased from 7 mm previously. Decreased edema surrounding left frontal hematoma. Chronic right occipital infarct. Residual mass effect on left frontal horn but also diminished. Vascular: No hyperdense vessels.  Carotid vascular calcifications Skull: Normal. Negative for fracture or focal lesion. Incompletely visualized bony mass off the right mandible. Sinuses/Orbits: No acute finding. Other: None IMPRESSION: 1. Motion degradation on multiple images limits the exam. Interval decreased size and density of left frontal intraparenchymal hematoma with decreased edema and decreased midline shift to the right compared to prior. Decreasing subarachnoid hemorrhage and interval resolution of intraventricular blood. 2. Chronic right occipital infarct Electronically Signed   By: Jasmine Pang M.D.   On: 01/06/2024 18:45   DG Chest Port 1 View Result Date: 01/06/2024 CLINICAL  DATA:  Weakness and tremors. EXAM: PORTABLE CHEST 1 VIEW COMPARISON:  AP chest 12/24/2023 FINDINGS: Cardiac silhouette and mediastinal contours are within normal limits. Mild atherosclerotic calcifications within the aortic arch. The lungs are clear. No pleural effusion or pneumothorax. No acute skeletal abnormality.Dense stool is seen within the partially visualized colon. IMPRESSION: 1.  No acute cardiopulmonary disease process. 2. Dense stool within the partially visualized colon. Electronically Signed   By: Neita Garnet M.D.   On: 01/06/2024 17:59     LOS: 1 day   Jeoffrey Massed, MD  [facility]    To contact the attending provider between 7A-7P or the covering provider during after hours 7P-7A, please log into the web site [email or web address] and access using universal [place] password for that web site. If you do not have the password, please call the hospital operator.  01/08/2024, 12:16 PM

## 2024-01-09 ENCOUNTER — Inpatient Hospital Stay (HOSPITAL_COMMUNITY)

## 2024-01-09 DIAGNOSIS — Z8679 Personal history of other diseases of the circulatory system: Secondary | ICD-10-CM | POA: Diagnosis not present

## 2024-01-09 DIAGNOSIS — E870 Hyperosmolality and hypernatremia: Secondary | ICD-10-CM | POA: Diagnosis not present

## 2024-01-09 DIAGNOSIS — G252 Other specified forms of tremor: Secondary | ICD-10-CM | POA: Diagnosis not present

## 2024-01-09 DIAGNOSIS — A419 Sepsis, unspecified organism: Secondary | ICD-10-CM | POA: Diagnosis not present

## 2024-01-09 LAB — BASIC METABOLIC PANEL WITH GFR
Anion gap: 10 (ref 5–15)
BUN: 24 mg/dL — ABNORMAL HIGH (ref 8–23)
CO2: 22 mmol/L (ref 22–32)
Calcium: 8.1 mg/dL — ABNORMAL LOW (ref 8.9–10.3)
Chloride: 112 mmol/L — ABNORMAL HIGH (ref 98–111)
Creatinine, Ser: 1.35 mg/dL — ABNORMAL HIGH (ref 0.61–1.24)
GFR, Estimated: 55 mL/min — ABNORMAL LOW (ref >60)
Glucose, Bld: 119 mg/dL — ABNORMAL HIGH (ref 70–99)
Potassium: 4 mmol/L (ref 3.5–5.1)
Sodium: 144 mmol/L (ref 135–145)

## 2024-01-09 LAB — CBC
HCT: 26.9 % — ABNORMAL LOW (ref 39.0–52.0)
Hemoglobin: 8.2 g/dL — ABNORMAL LOW (ref 13.0–17.0)
MCH: 30 pg (ref 26.0–34.0)
MCHC: 30.5 g/dL (ref 30.0–36.0)
MCV: 98.5 fL (ref 80.0–100.0)
Platelets: 183 10*3/uL (ref 150–400)
RBC: 2.73 MIL/uL — ABNORMAL LOW (ref 4.22–5.81)
RDW: 17 % — ABNORMAL HIGH (ref 11.5–15.5)
WBC: 12.6 10*3/uL — ABNORMAL HIGH (ref 4.0–10.5)
nRBC: 0 % (ref 0.0–0.2)

## 2024-01-09 LAB — BLOOD CULTURE ID PANEL (REFLEXED) - BCID2

## 2024-01-09 LAB — GLUCOSE, CAPILLARY
Glucose-Capillary: 130 mg/dL — ABNORMAL HIGH (ref 70–99)
Glucose-Capillary: 145 mg/dL — ABNORMAL HIGH (ref 70–99)
Glucose-Capillary: 150 mg/dL — ABNORMAL HIGH (ref 70–99)
Glucose-Capillary: 192 mg/dL — ABNORMAL HIGH (ref 70–99)
Glucose-Capillary: 316 mg/dL — ABNORMAL HIGH (ref 70–99)
Glucose-Capillary: 330 mg/dL — ABNORMAL HIGH (ref 70–99)
Glucose-Capillary: 365 mg/dL — ABNORMAL HIGH (ref 70–99)
Glucose-Capillary: 375 mg/dL — ABNORMAL HIGH (ref 70–99)
Glucose-Capillary: 90 mg/dL (ref 70–99)
Glucose-Capillary: 92 mg/dL (ref 70–99)

## 2024-01-09 MED ORDER — LORAZEPAM 2 MG/ML IJ SOLN
0.5000 mg | Freq: Four times a day (QID) | INTRAMUSCULAR | Status: DC | PRN
  Administered 2024-01-09 – 2024-01-14 (×5): 0.5 mg via INTRAVENOUS
  Filled 2024-01-09 (×5): qty 1

## 2024-01-09 MED ORDER — FREE WATER
300.0000 mL | Freq: Four times a day (QID) | Status: DC
  Administered 2024-01-09 – 2024-01-10 (×4): 300 mL

## 2024-01-09 MED ORDER — CEFAZOLIN SODIUM-DEXTROSE 2-4 GM/100ML-% IV SOLN
2.0000 g | INTRAVENOUS | Status: AC
  Administered 2024-01-09: 2 g via INTRAVENOUS
  Filled 2024-01-09: qty 100

## 2024-01-09 MED ORDER — SODIUM CHLORIDE 0.9 % IV SOLN
2.0000 g | Freq: Two times a day (BID) | INTRAVENOUS | Status: DC
  Administered 2024-01-09 – 2024-01-14 (×10): 2 g via INTRAVENOUS
  Filled 2024-01-09 (×11): qty 12.5

## 2024-01-09 MED ORDER — MORPHINE SULFATE (PF) 2 MG/ML IV SOLN
1.0000 mg | INTRAVENOUS | Status: DC | PRN
  Administered 2024-01-11 – 2024-01-14 (×7): 1 mg via INTRAVENOUS
  Filled 2024-01-09 (×7): qty 1

## 2024-01-09 MED ORDER — LORAZEPAM 2 MG/ML IJ SOLN
0.5000 mg | Freq: Once | INTRAMUSCULAR | Status: AC
  Administered 2024-01-09: 0.5 mg via INTRAVENOUS
  Filled 2024-01-09: qty 1

## 2024-01-09 NOTE — Progress Notes (Addendum)
 PROGRESS NOTE        PATIENT DETAILS Name: William Todd Age: 73 y.o. Sex: male Date of Birth: 02-05-1951 Admit Date: 01/06/2024 Admitting Physician Dewayne Shorter Levora Dredge, MD ZOX:[id number], Modena Slater, MD  Brief Summary: Patient is a 73 y.o.  male with history of recent hospitalization for ICH (2/15 to 3/20)-discharged to SNF-presented with fever and tremors.  Significant events: 3/25>> admit to [facility]-fever/tremors. 3/28>> agitated-gurgling at times-tachypneic-given IV Ativan-with significant improvement-much more calmer.  CXR with basilar infiltrates bilaterally.Suspicion for aspiration with agitation  Significant studies: 3/25>> CT head: Decrease size/density of left frontal ICH-decreasing SAH 3/25>> CXR: No PNA. 3/28>> CXR: Bibasilar interstitial opacities.  Significant microbiology data: 3/25>> COVID/influenza/RSV PCR: Negative 3/26>> blood culture: Negative  Procedures: None  Consults: None  Subjective: Restless this morning-hypoxic on room air-requiring on 3 L to maintain O2 saturations.  Gurgling this morning as well.  Objective: Vitals: Blood pressure (!) 137/57, pulse 93, temperature 99 F (37.2 C), temperature source Oral, resp. rate (!) 27, height 6' (1.829 m), weight 65.3 kg, SpO2 91%.   Exam: Gen Exam: Restless and will get agitated when I just touch him.  Gurgling at times. HEENT:atraumatic, normocephalic Chest: B/L clear to auscultation anteriorly CVS:S1S2 regular Abdomen:soft non tender, non distended.  PEG in place. Extremities:no edema Neurology: Difficult exam-but appears to be moving all 4 extremities.  Very restless/agitated with gentle touch. Skin: no rash  Pertinent Labs/Radiology:    Latest Ref Rng & Units 01/09/2024    6:12 AM 01/08/2024    7:48 AM 01/08/2024    5:20 AM  CBC  WBC 4.0 - 10.5 K/uL 12.6  11.6  11.6   Hemoglobin 13.0 - 17.0 g/dL 8.2  8.2  8.7   Hematocrit 39.0 - 52.0 % 26.9  27.9  28.5   Platelets 150 - 400  K/uL 183  199  200     Lab Results  Component Value Date   NA 144 01/09/2024   K 4.0 01/09/2024   CL 112 (H) 01/09/2024   CO2 22 01/09/2024      Assessment/Plan: Sepsis likely secondary to presumed aspiration pneumonia Slowly improving-agitated today-low-grade fever overnight-minimal leukocytosis continues.  Accumulating some secretions today-could have aspirated again earlier today when he was agitated.  Chest x-ray on 3/28 that showed new bibasilar interstitial infiltrates. Continue IV Unasyn He remains n.p.o. and on tube feeds. Continue to monitor closely  Hypernatremia Resolved with free water via PEG tube Decrease free water to 300 cc every every 6 hours Follow electrolytes.  Tremor Appears to be intentional-no tremors at rest Recently TSH on 2/15 stable. EEG-no seizures Could be a physiological response to febrile illness/recent hospitalization Neurology was curbsided by ED-recommendations were to switch metoprolol to propranolol. Need outpatient neurology evaluation if tremors continue after he recovers from his acute illness.  Acute metabolic encephalopathy Significantly agitated today-suspect this is from hypoxia/pneumonia Good response to IV Ativan given for comfort Continue to treat underlying issues with oxygen/antibiotics.  CKD stage IIIa Close to baseline Follow electrolytes periodically  Normocytic anemia Secondary to acute illness Hb close to usual baseline when compared to his most recent hospitalization Follow closely-and transfuse if significant drop.  Recent left frontal ICH/SAH with cerebral edema CT showing interval improvement Continue supportive care  Dysphagia Continue tube feeds.  DM-2 CBGs stable Semglee 7 units daily + SSI  Recent Labs    01/09/24  9604 01/09/24 0919 01/09/24 0950  GLUCAP 130* 90 92     HTN BP stable Continue with propranolol-will stop amlodipine today.  HLD Statin  Severe calorie malnutrition Tube  feeds Nutrition evaluation appreciated.  Bilateral conjunctivitis Cipro eyedrops for a few days.  History of prostate cancer Outpatient follow-up with urology/oncology.   Deep Tissue Pressure Injury L heel Unstageable PI L lateral foot  Stage 2 Pressure Injury L hip Unstageable PI sacrum  POA Wound care per wound care RN.  Palliative care DNR in place Although slowly improving-significantly agitated today-and may have aspirated again-his overall prognosis is very poor-will try to have goals of care discussion with the spouse when she arrives.  Have also placed a palliative care consult.  Addendum Long discussion with spouse x 2-she understands tenuous clinical situation-and that he has worsened overnight and has likely aspirated.  I suspect that he may be close to end-of-life-and that at this point he would likely benefit from being transitioned to comfort measures.  This was discussed with spouse-although she understands tenuous situation-she wants to touch base with other family members and will let us know.  I have placed a palliative care consult as well.  DNR reconfirmed and remains in place-spouse is aware that apart from supportive care/antibiotics-no further role in escalation of care.  Code status:   Code Status: Limited: Do not attempt resuscitation (DNR) -DNR-LIMITED -Do Not Intubate/DNI    DVT Prophylaxis: enoxaparin (LOVENOX) injection 40 mg Start: 01/07/24 1000  Family Communication: Spouse at bedside on 3/27.   Disposition Plan: Status is: Observation The patient will require care spanning > 2 midnights and should be moved to inpatient because: Severity of illness   Planned Discharge Destination:Skilled nursing facility   Diet: Diet Order     None         Antimicrobial agents: Anti-infectives (From admission, onward)    Start     Dose/Rate Route Frequency Ordered Stop   01/09/24 1000  vancomycin (VANCOCIN) IVPB 1000 mg/200 mL premix  Status:   Discontinued        1,000 mg 200 mL/hr over 60 Minutes Intravenous Every 48 hours 01/07/24 0140 01/07/24 0858   01/09/24 0900  ceFAZolin (ANCEF) IVPB 2g/100 mL premix       Note to Pharmacy: Give in IR for surgical prophylaxis   2 g 200 mL/hr over 30 Minutes Intravenous To Radiology 01/09/24 0803 01/10/24 0900   01/08/24 0500  vancomycin (VANCOCIN) IVPB 1000 mg/200 mL premix  Status:  Discontinued        1,000 mg 200 mL/hr over 60 Minutes Intravenous Every 24 hours 01/07/24 0858 01/07/24 1144   01/07/24 1300  Ampicillin-Sulbactam (UNASYN) 3 g in sodium chloride 0.9 % 100 mL IVPB        3 g 200 mL/hr over 30 Minutes Intravenous Every 6 hours 01/07/24 1212     01/07/24 0230  metroNIDAZOLE (FLAGYL) IVPB 500 mg  Status:  Discontinued        500 mg 100 mL/hr over 60 Minutes Intravenous 2 times daily 01/07/24 0121 01/07/24 1154   01/07/24 0230  vancomycin (VANCOREADY) IVPB 1250 mg/250 mL        1,250 mg 166.7 mL/hr over 90 Minutes Intravenous  Once 01/07/24 0121 01/07/24 0638   01/07/24 0200  ceFEPIme (MAXIPIME) 2 g in sodium chloride 0.9 % 100 mL IVPB  Status:  Discontinued        2 g 200 mL/hr over 30 Minutes Intravenous 2 times daily 01/07/24 0121 01/07/24  1154        MEDICATIONS: Scheduled Meds:  amantadine  100 mg Per Tube BID   atorvastatin  40 mg Per Tube q1800   ciprofloxacin  1 drop Both Eyes Q4H while awake   enoxaparin (LOVENOX) injection  40 mg Subcutaneous Daily   famotidine  20 mg Per Tube BID   feeding supplement (PROSource TF20)  60 mL Per Tube Daily   fiber supplement (BANATROL TF)  60 mL Per Tube BID   free water  300 mL Per Tube Q6H   Gerhardt's butt cream   Topical BID   insulin aspart  0-9 Units Subcutaneous Q4H   insulin glargine-yfgn  7 Units Subcutaneous QHS   leptospermum manuka honey  1 Application Topical Daily   multivitamin with minerals  1 tablet Per Tube Daily   polyethylene glycol  17 g Oral Daily   propranolol  40 mg Oral BID   senna-docusate  2  tablet Oral QHS   Continuous Infusions:  ampicillin-sulbactam (UNASYN) IV 3 g (01/09/24 0902)    ceFAZolin (ANCEF) IV     feeding supplement (OSMOLITE 1.5 CAL) 1,000 mL (01/09/24 0618)   PRN Meds:.acetaminophen, bisacodyl, levalbuterol, LORazepam, metoprolol tartrate, ondansetron **OR** ondansetron (ZOFRAN) IV   I have personally reviewed following labs and imaging studies  LABORATORY DATA: CBC: Recent Labs  Lab 01/06/24 1512 01/07/24 0422 01/08/24 0520 01/08/24 0748 01/09/24 0612  WBC 13.5* 10.2 11.6* 11.6* 12.6*  NEUTROABS 12.0*  --   --   --   --   HGB 7.9* 7.3* 8.7* 8.2* 8.2*  HCT 26.4* 24.8* 28.5* 27.9* 26.9*  MCV 97.8 99.2 95.0 97.6 98.5  PLT 239 198 200 199 183    Basic Metabolic Panel: Recent Labs  Lab 01/06/24 1512 01/07/24 0422 01/08/24 0748 01/09/24 0612  NA 147* 148* 150* 144  K 4.2 4.3 3.5 4.0  CL 106 113* 114* 112*  CO2 28 24 25 22   GLUCOSE 207* 210* 103* 119*  BUN 34* 27* 18 24*  CREATININE 1.54* 1.34* 1.36* 1.35*  CALCIUM 9.6 9.1 9.5 8.1*  MG 2.0  --  1.8  --   PHOS  --   --  2.7  --     GFR: Estimated Creatinine Clearance: 45 mL/min (A) (by C-G formula based on SCr of 1.35 mg/dL (H)).  Liver Function Tests: Recent Labs  Lab 01/06/24 1512 01/07/24 0422 01/08/24 0748  AST 44* 36 36  ALT 90* 69* 57*  ALKPHOS 143* 132* 139*  BILITOT 0.6 0.8 0.7  PROT 6.7 6.3* 6.6  ALBUMIN 2.5* 2.3* 2.5*   No results for input(s): "LIPASE", "AMYLASE" in the last 168 hours. Recent Labs  Lab 01/06/24 1512  AMMONIA 16    Coagulation Profile: No results for input(s): "INR", "PROTIME" in the last 168 hours.  Cardiac Enzymes: No results for input(s): "CKTOTAL", "CKMB", "CKMBINDEX", "TROPONINI" in the last 168 hours.  BNP (last 3 results) No results for input(s): "PROBNP" in the last 8760 hours.  Lipid Profile: No results for input(s): "CHOL", "HDL", "LDLCALC", "TRIG", "CHOLHDL", "LDLDIRECT" in the last 72 hours.  Thyroid Function Tests: No  results for input(s): "TSH", "T4TOTAL", "FREET4", "T3FREE", "THYROIDAB" in the last 72 hours.  Anemia Panel: No results for input(s): "VITAMINB12", "FOLATE", "FERRITIN", "TIBC", "IRON", "RETICCTPCT" in the last 72 hours.  Urine analysis:    Component Value Date/Time   COLORURINE YELLOW 01/06/2024 2340   APPEARANCEUR HAZY (A) 01/06/2024 2340   LABSPEC 1.010 01/06/2024 2340   PHURINE 5.0 01/06/2024 2340  GLUCOSEU >=500 (A) 01/06/2024 2340   HGBUR SMALL (A) 01/06/2024 2340   BILIRUBINUR NEGATIVE 01/06/2024 2340   KETONESUR NEGATIVE 01/06/2024 2340   PROTEINUR 30 (A) 01/06/2024 2340   NITRITE NEGATIVE 01/06/2024 2340   LEUKOCYTESUR NEGATIVE 01/06/2024 2340    Sepsis Labs: Lactic Acid, Venous    Component Value Date/Time   LATICACIDVEN 2.5 (HH) 01/07/2024 0025    MICROBIOLOGY: Recent Results (from the past 240 hours)  Resp panel by RT-PCR (RSV, Flu A&B, Covid) Anterior Nasal Swab     Status: None   Collection Time: 01/06/24  3:07 PM   Specimen: Anterior Nasal Swab  Result Value Ref Range Status   SARS Coronavirus 2 by RT PCR NEGATIVE NEGATIVE Final   Influenza A by PCR NEGATIVE NEGATIVE Final   Influenza B by PCR NEGATIVE NEGATIVE Final    Comment: (NOTE) The Xpert Xpress SARS-CoV-2/FLU/RSV plus assay is intended as an aid in the diagnosis of influenza from Nasopharyngeal swab specimens and should not be used as a sole basis for treatment. Nasal washings and aspirates are unacceptable for Xpert Xpress SARS-CoV-2/FLU/RSV testing.  Fact Sheet for Patients: BloggerCourse.com  Fact Sheet for Healthcare Providers: SeriousBroker.it  This test is not yet approved or cleared by the [place] FDA and has been authorized for detection and/or diagnosis of SARS-CoV-2 by FDA under an Emergency Use Authorization (EUA). This EUA will remain in effect (meaning this test can be used) for the duration of the COVID-19 declaration  under Section 564(b)(1) of the Act, 21 U.S.C. section 360bbb-3(b)(1), unless the authorization is terminated or revoked.     Resp Syncytial Virus by PCR NEGATIVE NEGATIVE Final    Comment: (NOTE) Fact Sheet for Patients: BloggerCourse.com  Fact Sheet for Healthcare Providers: SeriousBroker.it  This test is not yet approved or cleared by the [place] FDA and has been authorized for detection and/or diagnosis of SARS-CoV-2 by FDA under an Emergency Use Authorization (EUA). This EUA will remain in effect (meaning this test can be used) for the duration of the COVID-19 declaration under Section 564(b)(1) of the Act, 21 U.S.C. section 360bbb-3(b)(1), unless the authorization is terminated or revoked.  Performed at [facility], [address], [place], [place] [address]   Culture, blood (routine x 2)     Status: None (Preliminary result)   Collection Time: 01/07/24 12:18 AM   Specimen: BLOOD LEFT ARM  Result Value Ref Range Status   Specimen Description BLOOD LEFT ARM  Final   Special Requests   Final    BOTTLES DRAWN AEROBIC AND ANAEROBIC Blood Culture adequate volume   Culture   Final    NO GROWTH 2 DAYS Performed at [facility], [address] [place], [place] [address]    Report Status PENDING  Incomplete  Culture, blood (routine x 2)     Status: None (Preliminary result)   Collection Time: 01/07/24 12:20 AM   Specimen: BLOOD  Result Value Ref Range Status   Specimen Description BLOOD RIGHT ANTECUBITAL  Final   Special Requests   Final    BOTTLES DRAWN AEROBIC AND ANAEROBIC Blood Culture adequate volume   Culture   Final    NO GROWTH 2 DAYS Performed at [facility], [address], [place], [place] [address]    Report Status PENDING  Incomplete    RADIOLOGY STUDIES/RESULTS: DG Chest Port 1V same Day Result Date: 01/09/2024 CLINICAL DATA:  141880 SOB (shortness of breath) [id number] EXAM: PORTABLE  CHEST - 1  VIEW SAME DAY COMPARISON:  01/08/2024 FINDINGS: Relatively low lung volumes with some mild left perihilar and basilar interstitial opacities. Right lung clear. Heart size and mediastinal contours are within normal limits. Aortic Atherosclerosis (ICD10-170.0). Blunting of left lateral costophrenic angle. IMPRESSION: Low volumes with mild left perihilar and basilar interstitial opacities. Electronically Signed   By: Corlis Leak M.D.   On: 01/09/2024 10:00   DG Chest Port 1 View Result Date: 01/08/2024 CLINICAL DATA:  Shortness of breath. EXAM: PORTABLE CHEST 1 VIEW COMPARISON:  Chest radiograph dated 01/06/2024. FINDINGS: No focal consolidation, pleural effusion, or pneumothorax. The cardiac silhouette is within normal limits. No acute osseous pathology. IMPRESSION: No active disease. Electronically Signed   By: Elgie Collard M.D.   On: 01/08/2024 11:17   EEG adult Result Date: 01/08/2024 Charlsie Quest, MD     01/08/2024  8:24 AM Patient Name: William Todd MRN: [id number] Epilepsy Attending: Charlsie Quest Referring Physician/Provider: Maretta Bees, MD Date: 01/07/2024 Duration: 22.47 mins Patient history: 73yo M with left frontal ICH getting eeg to evaluate for seizure Level of alertness: Awake AEDs during EEG study: None Technical aspects: This EEG study was done with scalp electrodes positioned according to the 10-20 International system of electrode placement. Electrical activity was reviewed with band pass filter of 1-70Hz , sensitivity of 7 uV/mm, display speed of 62mm/sec with a 60Hz  notched filter applied as appropriate. EEG data were recorded continuously and digitally stored.  Video monitoring was available and reviewed as appropriate. Description: The posterior dominant rhythm consists of 8 Hz activity of moderate voltage (25-35 uV) seen predominantly in posterior head regions, symmetric and reactive to eye opening and eye closing. EEG showed intermittent generalized 5 to 6 Hz  theta slowing. Hyperventilation and photic stimulation were not performed.   ABNORMALITY - Intermittent slow, generalized IMPRESSION: This study is suggestive of mild diffuse encephalopathy. No seizures or epileptiform discharges were seen throughout the recording. Priyanka Annabelle Harman     LOS: 2 days   Jeoffrey Massed, MD  [facility]    To contact the attending provider between 7A-7P or the covering provider during after hours 7P-7A, please log into the web site [email or web address] and access using universal [place] password for that web site. If you do not have the password, please call the hospital operator.  01/09/2024, 10:37 AM

## 2024-01-09 NOTE — Progress Notes (Signed)
 PHARMACY - PHYSICIAN COMMUNICATION CRITICAL VALUE ALERT - BLOOD CULTURE IDENTIFICATION (BCID)  William Todd is an 73 y.o. male who presented to [facility] on 01/06/2024 with a chief complaint of tremors.  Assessment:  73 year old male recently admitted for ICH and discharged on 3/20- Now readmitted with tremors/sepsis. Suspected aspiration pneumonia- Blood cultures now with 1/4 staph species which is likely a contaminant. Also with gram negative rods not identified on [employer].   Name of physician (or Provider) Contacted: Ghimire  Current antibiotics: Unasyn   Changes to prescribed antibiotics recommended:  Broaden to cefepime until gram negative rod identified- Patient noted to have clinically worsened  Results for orders placed or performed during the hospital encounter of 01/06/24  Blood Culture ID Panel (Reflexed) (Collected: 01/07/2024 12:20 AM)  Result Value Ref Range   Enterococcus faecalis NOT DETECTED NOT DETECTED   Enterococcus Faecium NOT DETECTED NOT DETECTED   Listeria monocytogenes NOT DETECTED NOT DETECTED   Staphylococcus species DETECTED (A) NOT DETECTED   Staphylococcus aureus (BCID) NOT DETECTED NOT DETECTED   Staphylococcus epidermidis NOT DETECTED NOT DETECTED   Staphylococcus lugdunensis NOT DETECTED NOT DETECTED   Streptococcus species NOT DETECTED NOT DETECTED   Streptococcus agalactiae NOT DETECTED NOT DETECTED   Streptococcus pneumoniae NOT DETECTED NOT DETECTED   Streptococcus pyogenes NOT DETECTED NOT DETECTED   A.calcoaceticus-baumannii NOT DETECTED NOT DETECTED   Bacteroides fragilis NOT DETECTED NOT DETECTED   Enterobacterales NOT DETECTED NOT DETECTED   Enterobacter cloacae complex NOT DETECTED NOT DETECTED   Escherichia coli NOT DETECTED NOT DETECTED   Klebsiella aerogenes NOT DETECTED NOT DETECTED   Klebsiella oxytoca NOT DETECTED NOT DETECTED   Klebsiella pneumoniae NOT DETECTED NOT DETECTED   Proteus species NOT DETECTED NOT DETECTED    Salmonella species NOT DETECTED NOT DETECTED   Serratia marcescens NOT DETECTED NOT DETECTED   Haemophilus influenzae NOT DETECTED NOT DETECTED   Neisseria meningitidis NOT DETECTED NOT DETECTED   Pseudomonas aeruginosa NOT DETECTED NOT DETECTED   Stenotrophomonas maltophilia NOT DETECTED NOT DETECTED   Candida albicans NOT DETECTED NOT DETECTED   Candida auris NOT DETECTED NOT DETECTED   Candida glabrata NOT DETECTED NOT DETECTED   Candida krusei NOT DETECTED NOT DETECTED   Candida parapsilosis NOT DETECTED NOT DETECTED   Candida tropicalis NOT DETECTED NOT DETECTED   Cryptococcus neoformans/gattii NOT DETECTED NOT DETECTED    Sharin Mons, PharmD, BCPS, BCIDP Infectious Diseases Clinical Pharmacist Phone: [phone] 01/09/2024  4:09 PM

## 2024-01-09 NOTE — Evaluation (Signed)
 Occupational Therapy Evaluation Patient Details Name: William Todd MRN: [id number] DOB: Sep 28, 1951 Today's Date: 01/09/2024   History of Present Illness   William Todd is a 73 y.o. male who presented 3/25 from SNF with  fever and tremors. Sepsis likely secondary to presumed aspiration pneumonia. PMHx includes recent ICH, HTN, recurrent mechanical falls, protein calorie malnutrition, DM2, CKD, and prostate cancer 2014, L foot fx with AFO,  L THA 11/16/23, anxiety, dementia.     Clinical Impressions Patient seen with mobility specialist for OT evaluation. Patient with no family present. Patient with loud, labored breathing upon OT entry, O2 89% to 93% on 3L, and BP 132/78 (94). Patient did not open eyes volitionally, did not grimace to painful stimuli or sternal rub, did not alert to loud sounds. Patient did not alert with change of position. No blink to threat or visual tracking noted when eyelids opened manually by OT. RN notified after session as well as NT present for part of session stating that the patient has been like this since earlier this morning. Patient is currently total A for all aspects of care. OT recommending lesser intensive rehab < 3 hours, however greatly welcome palliative consult due to current level of function.      If plan is discharge home, recommend the following:   Two people to help with walking and/or transfers;A lot of help with bathing/dressing/bathroom;Assistance with feeding;Assistance with cooking/housework;Direct supervision/assist for medications management;Direct supervision/assist for financial management;Assist for transportation;Help with stairs or ramp for entrance;Supervision due to cognitive status     Functional Status Assessment   Patient has had a recent decline in their functional status and demonstrates the ability to make significant improvements in function in a reasonable and predictable amount of time.     Equipment  Recommendations   Other (comment) (defer to next venue)     Recommendations for Other Services   Other (comment) (Palliative)     Precautions/Restrictions   Precautions Precautions: Fall Recall of Precautions/Restrictions: Impaired Precaution/Restrictions Comments: seizure Required Braces or Orthoses: Other Brace Other Brace: L AFO Restrictions Weight Bearing Restrictions Per Provider Order: No     Mobility Bed Mobility Overal bed mobility: Needs Assistance Bed Mobility: Rolling Rolling: Total assist, +2 for physical assistance, +2 for safety/equipment         General bed mobility comments: No assist or following commands, +2 total to reposition    Transfers                   General transfer comment: Did not attempt on this date      Balance Overall balance assessment: Needs assistance                                         ADL either performed or assessed with clinical judgement   ADL Overall ADL's : Needs assistance/impaired Eating/Feeding: NPO   Grooming: Total assistance;Bed level   Upper Body Bathing: Total assistance   Lower Body Bathing: Total assistance   Upper Body Dressing : Total assistance   Lower Body Dressing: Total assistance   Toilet Transfer: Total assistance;+2 for physical assistance;+2 for safety/equipment   Toileting- Clothing Manipulation and Hygiene: Total assistance;+2 for physical assistance;+2 for safety/equipment;Bed level       Functional mobility during ADLs: Total assistance;+2 for physical assistance;+2 for safety/equipment;Cueing for safety;Cueing for sequencing General ADL Comments: Patient seen with mobility specialist for  OT evaluation. Patient with no family present. Patient with loud, labored breathing upon OT entry, O2 89% to 93% on 3L, and BP 132/78 (94). Patient did not open eyes volitionally, did not grimace to painful stimuli or sternal rub, did not alert to loud sounds. Patient  did not alert with change of position. No blink to threat or visual tracking noted when eyelids opened manually by OT. RN notified after session as well as NT present for part of session stating that the patient has been like this since earlier this morning. Patient is currently total A for all aspects of care. OT recommending lesser intensive rehab < 3 hours, however greatly welcome palliative consult due to current level of function.     Vision   Additional Comments: unable to assess to due to cognition     Perception Perception: Not tested       Praxis Praxis: Not tested       Pertinent Vitals/Pain Pain Assessment Pain Assessment: PAINAD Breathing: occasional labored breathing, short period of hyperventilation Negative Vocalization: none Facial Expression: smiling or inexpressive Body Language: relaxed Consolability: no need to console PAINAD Score: 1 Facial Expression: Relaxed, neutral Body Movements: Absence of movements Muscle Tension: Relaxed Compliance with ventilator (intubated pts.): N/A Vocalization (extubated pts.): N/A CPOT Total: 0 Pain Intervention(s): Limited activity within patient's tolerance, Monitored during session, Repositioned     Extremity/Trunk Assessment Upper Extremity Assessment Upper Extremity Assessment: Generalized weakness;Right hand dominant;RUE deficits/detail;LUE deficits/detail RUE Deficits / Details: no AROM elicited RUE Coordination: decreased gross motor LUE Deficits / Details: no AROM elicited LUE Coordination: decreased gross motor       Cervical / Trunk Assessment Cervical / Trunk Assessment: Other exceptions Cervical / Trunk Exceptions: Lt rotation, able to bring to neutral with gentle overpressure.   Communication Communication Communication: Impaired Factors Affecting Communication: Difficulty expressing self   Cognition Arousal: Stuporous Behavior During Therapy: Flat affect Cognition: History of cognitive impairments,  Cognition impaired             OT - Cognition Comments: Patient did not open eyes volitionally, did not grimace to painful stimuli or sternal rub, did not alert to loud sounds                 Following commands: Impaired Following commands impaired: Follows one step commands inconsistently (No command following)     Cueing  General Comments   Cueing Techniques: Verbal cues;Gestural cues;Tactile cues;Visual cues      Exercises     Shoulder Instructions      Home Living Family/patient expects to be discharged to:: Skilled nursing facility Living Arrangements: Spouse/significant other Available Help at Discharge: Family Type of Home: House Home Access: Stairs to enter [employer] of Steps: 1 from the garage into the house Entrance Stairs-Rails: Right;Left Home Layout: Two level;1/2 bath on main level;Able to live on main level with bedroom/bathroom Alternate Level Stairs-Number of Steps: 13 upstiars to bed/bath. Does have a murphy bed downstairs in a sunken room - 1 step down.   Bathroom Shower/Tub: Tub/shower unit;Walk-in shower   Bathroom Toilet: Standard     Home Equipment: Cane - single Librarian, academic (2 wheels)   Additional Comments: info from prior admission.      Prior Functioning/Environment Prior Level of Function : Needs assist             Mobility Comments: Has been up in recliner at SNF ADLs Comments: total assist.    OT Problem List: Decreased strength;Decreased range of motion;Decreased  activity tolerance;Impaired balance (sitting and/or standing);Impaired vision/perception;Decreased safety awareness;Decreased cognition;Decreased knowledge of use of DME or AE;Decreased coordination;Decreased knowledge of precautions;Cardiopulmonary status limiting activity;Impaired tone   OT Treatment/Interventions: Self-care/ADL training;Neuromuscular education;Energy conservation;DME and/or AE instruction;Manual therapy;Therapeutic  activities;Cognitive remediation/compensation;Visual/perceptual remediation/compensation;Balance training;Patient/family education      OT Goals(Current goals can be found in the care plan section)   Acute Rehab OT Goals Patient Stated Goal: unable OT Goal Formulation: Patient unable to participate in goal setting Time For Goal Achievement: 01/23/24 Potential to Achieve Goals: Poor ADL Goals Pt Will Perform Grooming: with mod assist;sitting Pt Will Transfer to Toilet: with max assist;bedside commode;squat pivot transfer Pt Will Perform Toileting - Clothing Manipulation and hygiene: with mod assist;sitting/lateral leans;sit to/from stand Additional ADL Goal #1: Patient will be able to follo 1-2 step commands 50% of the time. Additional ADL Goal #2: Patient will be able to complete bed mobility at mod A as a precursor to ADL and OOB activities. Additional ADL Goal #3: Patient will be able to sit EOB for 5 minutes with min A to promote increased activity tolerance.   OT Frequency:  Min 1X/week    Co-evaluation              AM-PAC OT "6 Clicks" Daily Activity     Outcome Measure Help from another person eating meals?: Total Help from another person taking care of personal grooming?: Total Help from another person toileting, which includes using toliet, bedpan, or urinal?: Total Help from another person bathing (including washing, rinsing, drying)?: Total Help from another person to put on and taking off regular upper body clothing?: Total Help from another person to put on and taking off regular lower body clothing?: Total 6 Click Score: 6   End of Session Nurse Communication: Mobility status;Other (comment);Need for lift equipment (poor participation, labored breathing)  Activity Tolerance: Other (comment) (did not demonstrate any awareness) Patient left: in bed;with call bell/phone within reach;with bed alarm set;Other (comment) (chair position)  OT Visit Diagnosis:  Unsteadiness on feet (R26.81);Other abnormalities of gait and mobility (R26.89);Muscle weakness (generalized) (M62.81);Other symptoms and signs involving the nervous system (R29.898);Other symptoms and signs involving cognitive function;Adult, failure to thrive (R62.7)                Time: 8119-1478 OT Time Calculation (min): 13 min Charges:  OT General Charges $OT Visit: 1 Visit OT Evaluation $OT Eval Moderate Complexity: 1 Mod  Pollyann Glen E. Tayo Maute, OTR/L Acute Rehabilitation Services [phone]   Cherlyn Cushing 01/09/2024, 1:37 PM

## 2024-01-09 NOTE — Plan of Care (Signed)

## 2024-01-09 NOTE — NC FL2 (Signed)
 [place] MEDICAID FL2 LEVEL OF CARE FORM     IDENTIFICATION  Patient Name: William Todd Birthdate: [birth year] Sex: male Admission Date (Current Location): 01/06/2024  [facility] and [place] Number:  Producer, television/film/video and Address:  [facility] [place], [address] [place], [place] [address]      Provider Number: [id number]  Attending Physician Name and Address:  Maretta Bees, MD  Relative Name and Phone Number:  Rosevelt Luu, wife, phone: [phone]    Current Level of Care: Hospital Recommended Level of Care: Skilled Nursing Facility Prior Approval Number:    Date Approved/Denied: 01/06/24 PASRR Number: [id number]  Discharge Plan: SNF    Current Diagnoses: Patient Active Problem List   Diagnosis Date Noted   Coarse tremors 01/07/2024   Febrile illness 01/07/2024   Sepsis (HCC) 01/07/2024   History of intracranial hemorrhage and subarachnoid hemorrhage 11/29/23 01/07/2024   Decubitus ulcer 01/07/2024   S/P percutaneous endoscopic gastrostomy (PEG) tube placement 12/23/2023(HCC) 01/07/2024   Hypernatremia 01/07/2024   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   Acute conjunctivitis, bilateral 01/07/2024   Protein-calorie malnutrition, severe 12/01/2023   Intraparenchymal hemorrhage of brain (HCC) 11/30/2023   Hypertension 11/30/2023   History of prostate cancer 11/30/2023   Non-insulin dependent type 2 diabetes mellitus (HCC) 11/30/2023   Normocytic anemia 11/30/2023   History of mood disorder 11/30/2023   HLD (hyperlipidemia) 11/30/2023   Subarachnoid hemorrhage (HCC) 11/30/2023   Acute metabolic encephalopathy 11/30/2023   ICH (intracerebral hemorrhage) (HCC) 11/30/2023   Malnutrition of moderate degree 11/19/2023   History of closed left femoral fracture s/p arthroplasty (HCC) 11/15/2023   Abnormal head CT 11/15/2023   Long-term memory impairment 11/15/2023   Acute kidney injury superimposed on chronic kidney disease (HCC)  11/15/2023   Prostate cancer (HCC) 07/15/2013    Orientation RESPIRATION BLADDER Height & Weight    Disorientation x 4    Normal Incontinent Weight: 65.3 kg Height:  6' (182.9 cm)  BEHAVIORAL SYMPTOMS/MOOD NEUROLOGICAL BOWEL NUTRITION STATUS      Incontinent Feeding tube (Osmolite 1.5 @ 50cc/hr)  AMBULATORY STATUS COMMUNICATION OF NEEDS Skin   Limited Assist   Skin abrasions (1)Deep Tissue Pressure Injury L heel 2) Unstageable Pressure Injury L lateral foot  3) Stage 2 Pressure Injury L hip 4) Unstageable Pressure Injury sacrum)                       Personal Care Assistance Level of Assistance  Bathing, Feeding, Dressing Bathing Assistance: Maximum assistance Feeding assistance: Limited assistance Dressing Assistance: Maximum assistance Total Care Assistance: Maximum assistance   Functional Limitations Info             SPECIAL CARE FACTORS FREQUENCY  PT (By licensed PT), OT (By licensed OT)     PT Frequency: 5x per week OT Frequency: 5x per week            Contractures Contractures Info: Not present    Additional Factors Info  Code Status, Allergies, Insulin Sliding Scale Code Status Info: DNR Allergies Info: No Known Allergies   Insulin Sliding Scale Info: Please review discharge summary       Current Medications (01/09/2024):  This is the current hospital active medication list Current Facility-Administered Medications  Medication Dose Route Frequency Provider Last Rate Last Admin   acetaminophen (TYLENOL) 160 MG/5ML solution 650 mg  650 mg Per Tube Q6H PRN Andris Baumann, MD   650 mg at 01/07/24 2100   amantadine (  SYMMETREL) 50 MG/5ML solution 100 mg  100 mg Per Tube BID Lindajo Royal V, MD   100 mg at 01/09/24 0848   Ampicillin-Sulbactam (UNASYN) 3 g in sodium chloride 0.9 % 100 mL IVPB  3 g Intravenous Q6H Reome, Earle J, RPH 200 mL/hr at 01/09/24 0902 3 g at 01/09/24 0902   atorvastatin (LIPITOR) tablet 40 mg  40 mg Per Tube q1800 Andris Baumann,  MD   40 mg at 01/08/24 1733   bisacodyl (DULCOLAX) suppository 10 mg  10 mg Rectal Daily PRN Maretta Bees, MD       ciprofloxacin (CILOXAN) 0.3 % ophthalmic solution 1 drop  1 drop Both Eyes Q4H while awake Lindajo Royal V, MD   1 drop at 01/09/24 0914   enoxaparin (LOVENOX) injection 40 mg  40 mg Subcutaneous Daily Lindajo Royal V, MD   40 mg at 01/09/24 0848   famotidine (PEPCID) tablet 20 mg  20 mg Per Tube BID Lindajo Royal V, MD   20 mg at 01/09/24 0845   feeding supplement (OSMOLITE 1.5 CAL) liquid 1,000 mL  1,000 mL Per Tube Continuous Maretta Bees, MD 50 mL/hr at 01/09/24 0618 1,000 mL at 01/09/24 0618   feeding supplement (PROSource TF20) liquid 60 mL  60 mL Per Tube Daily Maretta Bees, MD   60 mL at 01/09/24 0847   fiber supplement (BANATROL TF) liquid 60 mL  60 mL Per Tube BID Lindajo Royal V, MD   60 mL at 01/09/24 0846   free water 300 mL  300 mL Per Tube Q6H Maretta Bees, MD   300 mL at 01/09/24 1103   Gerhardt's butt cream   Topical BID Maretta Bees, MD   Given at 01/09/24 0858   insulin aspart (novoLOG) injection 0-9 Units  0-9 Units Subcutaneous Q4H Maretta Bees, MD   1 Units at 01/09/24 1123   insulin glargine-yfgn (SEMGLEE) injection 7 Units  7 Units Subcutaneous QHS Lindajo Royal V, MD   7 Units at 01/08/24 2200   leptospermum manuka honey (MEDIHONEY) paste 1 Application  1 Application Topical Daily Maretta Bees, MD   1 Application at 01/09/24 0850   levalbuterol (XOPENEX) nebulizer solution 0.63 mg  0.63 mg Nebulization Q6H PRN Andris Baumann, MD       LORazepam (ATIVAN) injection 0.5 mg  0.5 mg Intravenous Q6H PRN Maretta Bees, MD       metoprolol tartrate (LOPRESSOR) injection 2.5 mg  2.5 mg Intravenous Q5 min PRN Sundil, Subrina, MD   2.5 mg at 01/08/24 0245   morphine (PF) 2 MG/ML injection 1 mg  1 mg Intravenous Q4H PRN Maretta Bees, MD       multivitamin with minerals tablet 1 tablet  1 tablet Per Tube Daily Andris Baumann, MD   1 tablet at 01/09/24 0846   ondansetron (ZOFRAN) tablet 4 mg  4 mg Oral Q6H PRN Andris Baumann, MD       Or   ondansetron [facility]) injection 4 mg  4 mg Intravenous Q6H PRN Andris Baumann, MD       polyethylene glycol (MIRALAX / GLYCOLAX) packet 17 g  17 g Oral Daily Maretta Bees, MD   17 g at 01/09/24 0846   propranolol (INDERAL) tablet 40 mg  40 mg Oral BID Maretta Bees, MD   40 mg at 01/09/24 1105   senna-docusate (Senokot-S) tablet 2 tablet  2 tablet Oral QHS Ghimire, Shanker  M, MD   2 tablet at 01/08/24 2300     Discharge Medications: Please see discharge summary for a list of discharge medications.  Relevant Imaging Results:  Relevant Lab Results:   Additional Information SSN: [id number]  Alesia Richards, RN

## 2024-01-09 NOTE — TOC Progression Note (Addendum)
 Transition of Care [facility]) - Progression Note    Patient Details  Name: William Todd MRN: [id number] Date of Birth: 02-Mar-1951  Transition of [facility]) CM/SW Contact  Alesia Richards, RN 01/09/2024, 10:18 AM  Clinical Narrative:      CM to Bjorn Loser Admissions, [facility], phone: [phone], regarding confirmation of patient return to SNF. Per Bjorn Loser, patient is expected to return to SNF and will need a new auth.   CM follow up call placed to [place], Admissions, [facility] SNF regarding patient return to SNF and possible hospice services. Per Bjorn Loser, SNF daily rate is $415 per day and SNF requires 30 day advance prior to patient arrival. Per Bjorn Loser, patient can return to SNF with therapy and Palliative Services.   CM to patient's room regarding discharge care planning, SNF with possible hospice services versus SNF for short term rehab. Patient's wife, at patient's bedside. CM and patient discussed SNF daily rate and SNF requirement of 30 day advance. Per patient's wife, prefers for patient to return to SNF for short term rehab with Palliative Services. Per patient's wife, has also discussed above discharge options with Dr. Jerral Ralph earlier today and prefers patient to return to SNF for short term rehabilitation. Per patient's wife, regarding hospice services, "I will cross that bridge when I come to it. I cannot make that decision now."  Expected Discharge Plan: Skilled Nursing Facility Barriers to Discharge: Continued Medical Work up  Expected Discharge Plan and Services In-house Referral: Clinical Social Work     Living arrangements for the past 2 months: Single Family Home, Skilled Nursing Facility                   Social Determinants of Health (SDOH) Interventions SDOH Screenings   Food Insecurity: Patient Unable To Answer (01/07/2024)  Housing: Patient Unable To Answer (01/07/2024)  Transportation Needs: Patient Unable To Answer (01/07/2024)  Utilities: Patient Unable To  Answer (01/07/2024)  Social Connections: Patient Unable To Answer (01/07/2024)  Tobacco Use: Low Risk  (01/06/2024)    Readmission Risk Interventions    12/08/2023   12:40 PM  Readmission Risk Prevention Plan  Transportation Screening Complete  Medication Review (RN Care Manager) Complete  PCP or Specialist appointment within 3-5 days of discharge Complete  HRI or Home Care Consult Complete  SW Recovery Care/Counseling Consult Complete  Palliative Care Screening Complete  Skilled Nursing Facility Complete

## 2024-01-10 ENCOUNTER — Inpatient Hospital Stay (HOSPITAL_COMMUNITY)

## 2024-01-10 DIAGNOSIS — Z7189 Other specified counseling: Secondary | ICD-10-CM | POA: Diagnosis not present

## 2024-01-10 DIAGNOSIS — Z8679 Personal history of other diseases of the circulatory system: Secondary | ICD-10-CM | POA: Diagnosis not present

## 2024-01-10 DIAGNOSIS — E43 Unspecified severe protein-calorie malnutrition: Secondary | ICD-10-CM | POA: Diagnosis not present

## 2024-01-10 DIAGNOSIS — A419 Sepsis, unspecified organism: Secondary | ICD-10-CM | POA: Diagnosis not present

## 2024-01-10 DIAGNOSIS — R6510 Systemic inflammatory response syndrome (SIRS) of non-infectious origin without acute organ dysfunction: Secondary | ICD-10-CM | POA: Diagnosis not present

## 2024-01-10 DIAGNOSIS — G252 Other specified forms of tremor: Secondary | ICD-10-CM | POA: Diagnosis not present

## 2024-01-10 DIAGNOSIS — E870 Hyperosmolality and hypernatremia: Secondary | ICD-10-CM | POA: Diagnosis not present

## 2024-01-10 DIAGNOSIS — Z515 Encounter for palliative care: Secondary | ICD-10-CM | POA: Diagnosis not present

## 2024-01-10 LAB — BASIC METABOLIC PANEL WITH GFR
Anion gap: 6 (ref 5–15)
BUN: 41 mg/dL — ABNORMAL HIGH (ref 8–23)
CO2: 27 mmol/L (ref 22–32)
Calcium: 8.3 mg/dL — ABNORMAL LOW (ref 8.9–10.3)
Chloride: 113 mmol/L — ABNORMAL HIGH (ref 98–111)
Creatinine, Ser: 1.73 mg/dL — ABNORMAL HIGH (ref 0.61–1.24)
GFR, Estimated: 41 mL/min — ABNORMAL LOW (ref >60)
Glucose, Bld: 171 mg/dL — ABNORMAL HIGH (ref 70–99)
Potassium: 3.9 mmol/L (ref 3.5–5.1)
Sodium: 146 mmol/L — ABNORMAL HIGH (ref 135–145)

## 2024-01-10 LAB — GLUCOSE, CAPILLARY
Glucose-Capillary: 141 mg/dL — ABNORMAL HIGH (ref 70–99)
Glucose-Capillary: 151 mg/dL — ABNORMAL HIGH (ref 70–99)
Glucose-Capillary: 166 mg/dL — ABNORMAL HIGH (ref 70–99)
Glucose-Capillary: 190 mg/dL — ABNORMAL HIGH (ref 70–99)
Glucose-Capillary: 210 mg/dL — ABNORMAL HIGH (ref 70–99)
Glucose-Capillary: 89 mg/dL (ref 70–99)

## 2024-01-10 LAB — CBC WITH DIFFERENTIAL/PLATELET
Abs Immature Granulocytes: 0.08 10*3/uL — ABNORMAL HIGH (ref 0.00–0.07)
Basophils Absolute: 0 10*3/uL (ref 0.0–0.1)
Basophils Relative: 0 %
Eosinophils Absolute: 0.1 10*3/uL (ref 0.0–0.5)
Eosinophils Relative: 1 %
HCT: 23.7 % — ABNORMAL LOW (ref 39.0–52.0)
Hemoglobin: 7.2 g/dL — ABNORMAL LOW (ref 13.0–17.0)
Immature Granulocytes: 1 %
Lymphocytes Relative: 5 %
Lymphs Abs: 0.7 10*3/uL (ref 0.7–4.0)
MCH: 29.4 pg (ref 26.0–34.0)
MCHC: 30.4 g/dL (ref 30.0–36.0)
MCV: 96.7 fL (ref 80.0–100.0)
Monocytes Absolute: 0.5 10*3/uL (ref 0.1–1.0)
Monocytes Relative: 4 %
Neutro Abs: 11.6 10*3/uL — ABNORMAL HIGH (ref 1.7–7.7)
Neutrophils Relative %: 89 %
Platelets: 144 10*3/uL — ABNORMAL LOW (ref 150–400)
RBC: 2.45 MIL/uL — ABNORMAL LOW (ref 4.22–5.81)
RDW: 16.8 % — ABNORMAL HIGH (ref 11.5–15.5)
WBC: 13 10*3/uL — ABNORMAL HIGH (ref 4.0–10.5)
nRBC: 0 % (ref 0.0–0.2)

## 2024-01-10 LAB — PROCALCITONIN: Procalcitonin: 0.66 ng/mL

## 2024-01-10 LAB — BRAIN NATRIURETIC PEPTIDE: B Natriuretic Peptide: 629.3 pg/mL — ABNORMAL HIGH (ref 0.0–100.0)

## 2024-01-10 MED ORDER — GLYCOPYRROLATE 0.2 MG/ML IJ SOLN
0.1000 mg | Freq: Three times a day (TID) | INTRAMUSCULAR | Status: DC
Start: 2024-01-10 — End: 2024-01-14
  Administered 2024-01-10 – 2024-01-14 (×13): 0.1 mg via INTRAVENOUS
  Filled 2024-01-10 (×13): qty 1

## 2024-01-10 MED ORDER — MIDODRINE HCL 5 MG PO TABS
10.0000 mg | ORAL_TABLET | Freq: Three times a day (TID) | ORAL | Status: DC
  Administered 2024-01-11 – 2024-01-14 (×7): 10 mg
  Filled 2024-01-10 (×11): qty 2

## 2024-01-10 MED ORDER — FREE WATER
400.0000 mL | Freq: Four times a day (QID) | Status: DC
  Administered 2024-01-10 – 2024-01-14 (×17): 400 mL

## 2024-01-10 MED ORDER — POLYETHYLENE GLYCOL 3350 17 G PO PACK
17.0000 g | PACK | Freq: Every day | ORAL | Status: DC
  Administered 2024-01-11 – 2024-01-14 (×4): 17 g
  Filled 2024-01-10 (×4): qty 1

## 2024-01-10 MED ORDER — VANCOMYCIN HCL IN DEXTROSE 1-5 GM/200ML-% IV SOLN
1000.0000 mg | INTRAVENOUS | Status: DC
  Administered 2024-01-10: 1000 mg via INTRAVENOUS
  Filled 2024-01-10: qty 200

## 2024-01-10 MED ORDER — VANCOMYCIN HCL 750 MG/150ML IV SOLN
750.0000 mg | INTRAVENOUS | Status: DC
  Administered 2024-01-11 – 2024-01-12 (×2): 750 mg via INTRAVENOUS
  Filled 2024-01-10 (×2): qty 150

## 2024-01-10 MED ORDER — SENNOSIDES-DOCUSATE SODIUM 8.6-50 MG PO TABS
2.0000 | ORAL_TABLET | Freq: Every day | ORAL | Status: DC
  Administered 2024-01-10 – 2024-01-13 (×4): 2
  Filled 2024-01-10 (×3): qty 2

## 2024-01-10 MED ORDER — PROPRANOLOL HCL 10 MG PO TABS
40.0000 mg | ORAL_TABLET | Freq: Two times a day (BID) | ORAL | Status: DC
  Administered 2024-01-10 – 2024-01-14 (×8): 40 mg
  Filled 2024-01-10 (×7): qty 4

## 2024-01-10 MED ORDER — MIDODRINE HCL 5 MG PO TABS
10.0000 mg | ORAL_TABLET | Freq: Three times a day (TID) | ORAL | Status: DC
  Filled 2024-01-10: qty 2

## 2024-01-10 MED ORDER — ACETYLCYSTEINE 20 % IN SOLN
3.0000 mL | Freq: Two times a day (BID) | RESPIRATORY_TRACT | Status: DC
  Administered 2024-01-11 – 2024-01-13 (×5): 3 mL via RESPIRATORY_TRACT
  Filled 2024-01-10 (×11): qty 4

## 2024-01-10 MED ORDER — MIDODRINE HCL 5 MG PO TABS
10.0000 mg | ORAL_TABLET | Freq: Three times a day (TID) | ORAL | Status: DC
  Administered 2024-01-10 (×2): 10 mg via ORAL
  Filled 2024-01-10: qty 2

## 2024-01-10 NOTE — Plan of Care (Signed)

## 2024-01-10 NOTE — Plan of Care (Signed)
  Problem: Fluid Volume: Goal: Ability to maintain a balanced intake and output will improve Outcome: Progressing   Problem: Nutritional: Goal: Maintenance of adequate nutrition will improve Outcome: Progressing   Problem: Skin Integrity: Goal: Risk for impaired skin integrity will decrease Outcome: Progressing   Problem: Clinical Measurements: Goal: Diagnostic test results will improve Outcome: Progressing Goal: Signs and symptoms of infection will decrease Outcome: Progressing   Problem: Respiratory: Goal: Ability to maintain adequate ventilation will improve Outcome: Progressing

## 2024-01-10 NOTE — Progress Notes (Addendum)
 Received a call from patient RN that respiratory 37 and O2 sat dropped to 85%.  Patient has been placed on nasal cannula oxygen 5 L and O2 sat improved to 95%. Per chart review patient has been managed for sepsis in the setting of aspiration pneumonia.  Initially he was being on Unasyn which has been transition to cefepime in the setting of bacteremia as well. Physical exam showing left-sided lung field rhonchi.  No evidence of wheezing. -Obtain chest x-ray which is showing development of right and lower lung zone consolidation pneumonia.  Small left pleural effusion. As comparing of chest x-ray from morning it showed interstitial opacities. - Blood cultures already in process. - Continue IV cefepime 2 g daily. - Given patient being hospitalized for more than 48 hours there is risk for development of healthcare associated pneumonia as well.   Adding IV vancomycin pharmacy consult and continue IV cefepime.     -Continue supplemental oxygen, Xopenex nebulizer and Mucomyst twice daily.  Continue supportive care.   Tereasa Coop, MD [facility] 01/10/2024, 1:48 AM

## 2024-01-10 NOTE — Progress Notes (Signed)
 Pharmacy Antibiotic Note  William Todd is a 73 y.o. male admitted on 01/06/2024 with sepsis/PNA.  Pharmacy has been consulted for Vancomycin and Cefepime  dosing.  Had initially improved but now much worse-more hypoxic-continues to have leukocytosis-looks like he may have aspirated again last night. Antibiotics have now been broadened to vancomycin/cefepime.  Plan: Change vancomycin to 750 mg IV q24h (eAUC 475 using Scr 1.73, TBW, Vd 0.72) - IBW: 78 kg so TBW <IBW  Continue cefepime 2 g IV q12h Monitor clinical improvement, CBC, and renal function   Height: 6' (182.9 cm) Weight: 65.3 kg (143 lb 15.4 oz) IBW/kg (Calculated) : 77.6  Temp (24hrs), Avg:99 F (37.2 C), Min:97.9 F (36.6 C), Max:100.2 F (37.9 C)  Recent Labs  Lab 01/06/24 1512 01/07/24 0018 01/07/24 0025 01/07/24 0422 01/08/24 0520 01/08/24 0748 01/09/24 0612 01/10/24 0436  WBC 13.5*  --   --  10.2 11.6* 11.6* 12.6* 13.0*  CREATININE 1.54*  --   --  1.34*  --  1.36* 1.35* 1.73*  LATICACIDVEN  --  2.3* 2.5*  --   --   --   --   --     Estimated Creatinine Clearance: 35.1 mL/min (A) (by C-G formula based on SCr of 1.73 mg/dL (H)).    No Known Allergies  Vanco 3/26 >> Flagyl 3/26 >> 4/1 Cefepime 3/26 >>   3/25>> COVID/influenza/RSV PCR: Negative 3/26>> blood culture: Gram-positive cocci (likely contaminant), gram-negative rod 3/28>> blood culture: No growth  Roslyn Smiling, PharmD PGY1 Pharmacy Resident 01/10/2024 11:52 AM

## 2024-01-10 NOTE — Consult Note (Cosign Needed Addendum)
 Palliative Care Consult Note                                  Date: 01/10/2024   Patient Name: William Todd  DOB: 1951-08-23  MRN: [id number]  Age / Sex: 73 y.o., male  PCP: Knox Royalty, MD Referring Physician: Maretta Bees, MD  Reason for Consultation: Establishing goals of care  HPI/Patient Profile: 73 y.o. male  with past medical history of recent ICH, HTN, recurrent mechanical falls, protein calorie malnutrition, DM2, CKD, prostate cancer (s/p radiation), hip fracture s/p surgical intervention 11/16/23, anxiety, and dementia admitted on 01/06/2024 from SNF with fever and tremors. Sepsis likely secondary to presumed aspiration pneumonia.   Last night patient had worsening respiratory status with worsening chest xray.  Patient is known to Palliative Medicine from previous hospitalization.  Past Medical History:  Diagnosis Date   Anxiety    new dx   Chronic kidney disease    Diabetes mellitus    type ii   History of radiation therapy 09/29/13- 11/25/13   prostate 7800 cGy 40 sessions, seminal vesicles 5600 cGy 40 sessions   Hypertension    Prostate cancer (HCC) 06/01/2013   gleason 7    Subjective:   I have reviewed medical records including EPIC notes, labs and imaging, receive update from Dr. Jerral Ralph, assessed the patient and then met at bedside with patient's wife William Todd and daughter to discuss diagnosis prognosis, GOC, EOL wishes, disposition and options.  I introduced Palliative Medicine as specialized medical care for people living with serious illness. It focuses on providing relief from symptoms and stress of a serious illness. The goal is to improve quality of life for both the patient and the family.  Today's Discussion: Patient lying in bed asleep. His breathing looked uncomfortable but he was in NAD. Patient unable to participate in conversation. Patient's wife William Todd and daughter were at bedside. William Todd shared  her understanding of the patient's progress at rehab before he was readmitted to the hospital. She shared her understanding of his current hospitalization. She shared that she understands his pneumonia is worsening. We discuss his continued risk for aspiration pneumonia with his dysphagia. Answered questions from patient's daughter around dysphagia and aspiration pneumonia. I shared my understanding of his current hospitalization including limitations of ongoing interventions and high risk for further decline despite aggressive treatment efforts.  Patient's daughter was shocked in hearing this and asked for a few minutes to process it. Emotional support provided. We then discussed goals of care. The patient's wife told me they have never discussed goals of care with the patient. We discussed code status and scope or care. The difference between an aggressive medical intervention path and a palliative comfort care path for this patient at this time was had. They would like to keep the patient DNR/DNI.  Patient's wife shared that they will never move him to a palliative/ comfort care path. I encouraged the family to discuss and consider if there were any circumstance where she would consider a comfort care path. I shared that the patient is exhibiting signs and symptoms that are consistent with end of life symptoms. While we are continuing to treat the treatable we are limited in symptom management.   Discussed the importance of continued conversation with family and the medical providers regarding overall plan of care and treatment options, ensuring decisions are within the context of the patient's values and GOCs.  Questions and concerns were addressed. Hard Choices booklet left for review. The family was encouraged to call with questions or concerns. PMT will continue to support holistically.  Review of Systems  Unable to perform ROS   Objective:   Primary Diagnoses: Present on Admission:  Coarse  tremors  Hypertension  Protein-calorie malnutrition, severe   Physical Exam Constitutional:      General: He is sleeping.     Appearance: He is ill-appearing.  Cardiovascular:     Rate and Rhythm: Normal rate.  Pulmonary:     Effort: Tachypnea and accessory muscle usage present.  Skin:    General: Skin is warm and dry.     Vital Signs:  BP 107/61 (BP Location: Left Arm)   Pulse 70   Temp 98.8 F (37.1 C) (Axillary)   Resp (!) 30   Ht 6' (1.829 m)   Wt 65.3 kg   SpO2 99%   BMI 19.52 kg/m   Palliative Assessment/Data: 30% with tube feeds    Advanced Care Planning:   Existing Vynca/ACP Documentation: None  Primary Decision Maker: NEXT OF KIN. Patient's wife William Todd  Code Status/Advance Care Planning: DNR  Assessment & Plan:   SUMMARY OF RECOMMENDATIONS   DNR DNI Continue current plan of treating the treatable Encouraged family to continue discussing goals of care PMT will continue to follow- will follow up tomorrow  Discussed with: Dr. Jerral Ralph and bedside RN  Time Total: 75 minutes  Thank you for allowing [place] to participate in the care of William Todd PMT will continue to support holistically.   Signed by: Sarina Ser, NP Palliative Medicine Team  Team Phone # [phone] (Nights/Weekends)  01/10/2024, 1:42 PM

## 2024-01-10 NOTE — Progress Notes (Addendum)
 PROGRESS NOTE        PATIENT DETAILS Name: William Todd Age: 73 y.o. Sex: male Date of Birth: 14-Apr-1951 Admit Date: 01/06/2024 Admitting Physician Dewayne Shorter Levora Dredge, MD ZOX:[id number], Modena Slater, MD  Brief Summary: Patient is a 73 y.o.  male with history of recent hospitalization for ICH (2/15 to 3/20)-discharged to SNF-presented with fever and tremors.  Significant events: 3/25>> admit to [facility]-fever/tremors. 3/28>> agitated-gurgling at times-tachypneic-given IV Ativan-with significant improvement-much more calmer.  CXR with basilar infiltrates bilaterally.Suspicion for aspiration with agitation.  Antibiotics broadened to cefepime. 3/29>> overnight O2 saturations dropped to 85-more left-sided infiltrates on repeat CXR.  Vancomycin started.  Significant studies: 3/25>> CT head: Decrease size/density of left frontal ICH-decreasing SAH 3/25>> CXR: No PNA. 3/28>> CXR: Bibasilar interstitial opacities. 3/29>> CXR: Left mid/lower lobe consolidation.  Significant microbiology data: 3/25>> COVID/influenza/RSV PCR: Negative 3/26>> blood culture: Gram-positive cocci (likely contaminant), gram-negative rod 3/28>> blood culture: No growth.  Procedures: None  Consults: Palliative care  Subjective: Resting comfortably-but gets agitated/starts moving all 4 extremities with minimal touch.  Does not follow commands.  Now on 3-4 L of oxygen.  Objective: Vitals: Blood pressure 122/88, pulse 76, temperature 98.1 F (36.7 C), temperature source Axillary, resp. rate (!) 21, height 6' (1.829 m), weight 65.3 kg, SpO2 100%.   Exam: Gen Exam: Not in any distress-but the movement you touch him-he gets agitated and very restless. HEENT:atraumatic, normocephalic Chest: More transmitted upper airway sounds today compared to the past few days. CVS:S1S2 regular Abdomen:soft non tender, non distended Extremities:no edema Neurology: Difficult exam but moving all 4  extremities. Skin: no rash  Pertinent Labs/Radiology:    Latest Ref Rng & Units 01/10/2024    4:36 AM 01/09/2024    6:12 AM 01/08/2024    7:48 AM  CBC  WBC 4.0 - 10.5 K/uL 13.0  12.6  11.6   Hemoglobin 13.0 - 17.0 g/dL 7.2  8.2  8.2   Hematocrit 39.0 - 52.0 % 23.7  26.9  27.9   Platelets 150 - 400 K/uL 144  183  199     Lab Results  Component Value Date   NA 146 (H) 01/10/2024   K 3.9 01/10/2024   CL 113 (H) 01/10/2024   CO2 27 01/10/2024      Assessment/Plan: Sepsis likely secondary to presumed aspiration pneumonia-possible gram-negative bacteremia Had initially improved but now much worse-more hypoxic-continues to have leukocytosis-looks like he may have aspirated again last night.   Antibiotics have now been broadened to vancomycin/cefepime Await repeat cultures Very difficult situation-recent prolonged hospitalization due to ICH-appears to have severe dysphagia-even though he is n.p.o.-he appears to be aspirating his secretions. Long discussion with spouse on 3/28 at bedside-DNR in place but she is not yet ready to transition to comfort measures.  I suspect that we may be heading to an end-of-life situation.  Palliative care has been consulted-will await further recommendations. In the interim-continue antibiotics-add Robinul-suction as much as we can  Acute hypoxic respiratory failure Due to aspiration pneumonia-requiring oxygen up to 3-4 L.  Suspect he has had aspiration episodes for the past 2 nights.  See above discussion.  Acute metabolic encephalopathy Agitated/lethargic-suspect this is from hypoxia/pneumonia. Continue to treat underlying pneumonia Per my discussion with spouse on 3/28-DNR remains in place-okay to use as needed morphine/Ativan for comfort.  As noted above-she is not yet ready to transition to  comfort measures.  Hypernatremia Mild hypernatremia today-continue free water flushes with PEG tube Follow electrolytes.    Tremor Appears to be  intentional-no tremors at rest Recently TSH on 2/15 stable. EEG-no seizures Could be a physiological response to febrile illness/recent hospitalization Neurology was curbsided by ED-recommendations were to switch metoprolol to propranolol. Need outpatient neurology evaluation if tremors continue after he recovers from his acute illness.  AKI CKD stage IIIa Worsening renal function overnight-suspect this is due to underlying sepsis/pneumonia. Volume status reasonable Getting free water and PEG tube feeds Repeat electrolytes tomorrow.  Normocytic anemia Secondary to acute illness Hb close to usual baseline when compared to his most recent hospitalization Follow closely-and transfuse if significant drop.  Recent left frontal ICH/SAH with cerebral edema CT showing interval improvement Continue supportive care  Dysphagia Continue tube feeds. Remains n.p.o. See above regarding concerns for aspiration for the past 2 nights Starting Robinul. Oral suctioning as needed.     DM-2 CBGs stable Semglee 7 units daily + SSI  Recent Labs    01/09/24 2333 01/10/24 0357 01/10/24 0821  GLUCAP 316* 190* 141*     HTN BP stable Continue propranolol with holding parameters Amlodipine discontinued.  HLD Statin  Severe calorie malnutrition Tube feeds Nutrition evaluation appreciated.  Bilateral conjunctivitis Cipro eyedrops for a few days.  History of prostate cancer Outpatient follow-up with urology/oncology.   Deep Tissue Pressure Injury L heel Unstageable PI L lateral foot  Stage 2 Pressure Injury L hip Unstageable PI sacrum  POA Wound care per wound care RN.  Palliative care DNR in place Unfortunate 73 year old gentleman who recently had an ICH-severe dysphagia-PEG tube in place-just discharged to SNF-admitted here with fever/tremors/chills-likely secondary to aspiration pneumonia-Hospital course complicated by worsening hypoxia/sepsis physiology due to ongoing  aspiration.  Extensive discussion with the spouse for the past several days including on 3/28-she understands tenuous situation-I have explained to her that he may be heading to an end-of-life situation-however at this point-spouse does not want to transition to comfort measures.  I have consulted palliative care.  He remains on maximal medical treatment with hardly any room for any further escalation in care.  Patient does get agitated with simple touch-yesterday he was very tachypneic and agitated-after extensive discussion with spouse-she was agreeable to use Ativan/morphine for comfort but wants to continue with full care otherwise.  Await palliative care evaluation.  Addendum Spoke with spouse again today-I showed her chest x-ray images-explained that he has developed some AKI-with worsening hypoxemia-I again explained tenuous situation and that he is getting close to end-of-life and that he may not survive this hospitalization.  We discussed pathophysiology of how dysphagia in the setting of ICH/stroke causes aspiration pneumonia.  Awaiting palliative care evaluation-continue with care as outlined above.   Code status:   Code Status: Limited: Do not attempt resuscitation (DNR) -DNR-LIMITED -Do Not Intubate/DNI    DVT Prophylaxis: enoxaparin (LOVENOX) injection 40 mg Start: 01/07/24 1000  Family Communication: Spouse at bedside on 3/28   Disposition Plan: Status is: Observation The patient will require care spanning > 2 midnights and should be moved to inpatient because: Severity of illness   Planned Discharge Destination:Skilled nursing facility   Diet: Diet Order     None         Antimicrobial agents: Anti-infectives (From admission, onward)    Start     Dose/Rate Route Frequency Ordered Stop   01/10/24 0300  vancomycin (VANCOCIN) IVPB 1000 mg/200 mL premix        1,000  mg 200 mL/hr over 60 Minutes Intravenous Every 24 hours 01/10/24 0155     01/09/24 1700  ceFEPIme  (MAXIPIME) 2 g in sodium chloride 0.9 % 100 mL IVPB        2 g 200 mL/hr over 30 Minutes Intravenous Every 12 hours 01/09/24 1608     01/09/24 1000  vancomycin (VANCOCIN) IVPB 1000 mg/200 mL premix  Status:  Discontinued        1,000 mg 200 mL/hr over 60 Minutes Intravenous Every 48 hours 01/07/24 0140 01/07/24 0858   01/09/24 0900  ceFAZolin (ANCEF) IVPB 2g/100 mL premix       Note to Pharmacy: Give in IR for surgical prophylaxis   2 g 200 mL/hr over 30 Minutes Intravenous To Radiology 01/09/24 0803 01/09/24 1137   01/08/24 0500  vancomycin (VANCOCIN) IVPB 1000 mg/200 mL premix  Status:  Discontinued        1,000 mg 200 mL/hr over 60 Minutes Intravenous Every 24 hours 01/07/24 0858 01/07/24 1144   01/07/24 1300  Ampicillin-Sulbactam (UNASYN) 3 g in sodium chloride 0.9 % 100 mL IVPB  Status:  Discontinued        3 g 200 mL/hr over 30 Minutes Intravenous Every 6 hours 01/07/24 1212 01/09/24 1608   01/07/24 0230  metroNIDAZOLE (FLAGYL) IVPB 500 mg  Status:  Discontinued        500 mg 100 mL/hr over 60 Minutes Intravenous 2 times daily 01/07/24 0121 01/07/24 1154   01/07/24 0230  vancomycin (VANCOREADY) IVPB 1250 mg/250 mL        1,250 mg 166.7 mL/hr over 90 Minutes Intravenous  Once 01/07/24 0121 01/07/24 0638   01/07/24 0200  ceFEPIme (MAXIPIME) 2 g in sodium chloride 0.9 % 100 mL IVPB  Status:  Discontinued        2 g 200 mL/hr over 30 Minutes Intravenous 2 times daily 01/07/24 0121 01/07/24 1154        MEDICATIONS: Scheduled Meds:  acetylcysteine  3 mL Nebulization BID   amantadine  100 mg Per Tube BID   atorvastatin  40 mg Per Tube q1800   ciprofloxacin  1 drop Both Eyes Q4H while awake   enoxaparin (LOVENOX) injection  40 mg Subcutaneous Daily   famotidine  20 mg Per Tube BID   feeding supplement (PROSource TF20)  60 mL Per Tube Daily   fiber supplement (BANATROL TF)  60 mL Per Tube BID   free water  400 mL Per Tube Q6H   Gerhardt's butt cream   Topical BID    glycopyrrolate  0.1 mg Intravenous TID   insulin aspart  0-9 Units Subcutaneous Q4H   insulin glargine-yfgn  7 Units Subcutaneous QHS   leptospermum manuka honey  1 Application Topical Daily   midodrine  10 mg Oral TID WC   multivitamin with minerals  1 tablet Per Tube Daily   polyethylene glycol  17 g Oral Daily   propranolol  40 mg Oral BID   senna-docusate  2 tablet Oral QHS   Continuous Infusions:  ceFEPime (MAXIPIME) IV 2 g (01/10/24 0503)   feeding supplement (OSMOLITE 1.5 CAL) 1,000 mL (01/09/24 1516)   vancomycin 1,000 mg (01/10/24 0301)   PRN Meds:.acetaminophen, bisacodyl, levalbuterol, LORazepam, metoprolol tartrate, morphine injection, ondansetron **OR** ondansetron (ZOFRAN) IV   I have personally reviewed following labs and imaging studies  LABORATORY DATA: CBC: Recent Labs  Lab 01/06/24 1512 01/07/24 0422 01/08/24 0520 01/08/24 0748 01/09/24 0612 01/10/24 0436  WBC 13.5* 10.2 11.6* 11.6* 12.6*  13.0*  NEUTROABS 12.0*  --   --   --   --  11.6*  HGB 7.9* 7.3* 8.7* 8.2* 8.2* 7.2*  HCT 26.4* 24.8* 28.5* 27.9* 26.9* 23.7*  MCV 97.8 99.2 95.0 97.6 98.5 96.7  PLT 239 198 200 199 183 144*    Basic Metabolic Panel: Recent Labs  Lab 01/06/24 1512 01/07/24 0422 01/08/24 0748 01/09/24 0612 01/10/24 0436  NA 147* 148* 150* 144 146*  K 4.2 4.3 3.5 4.0 3.9  CL 106 113* 114* 112* 113*  CO2 28 24 25 22 27   GLUCOSE 207* 210* 103* 119* 171*  BUN 34* 27* 18 24* 41*  CREATININE 1.54* 1.34* 1.36* 1.35* 1.73*  CALCIUM 9.6 9.1 9.5 8.1* 8.3*  MG 2.0  --  1.8  --   --   PHOS  --   --  2.7  --   --     GFR: Estimated Creatinine Clearance: 35.1 mL/min (A) (by C-G formula based on SCr of 1.73 mg/dL (H)).  Liver Function Tests: Recent Labs  Lab 01/06/24 1512 01/07/24 0422 01/08/24 0748  AST 44* 36 36  ALT 90* 69* 57*  ALKPHOS 143* 132* 139*  BILITOT 0.6 0.8 0.7  PROT 6.7 6.3* 6.6  ALBUMIN 2.5* 2.3* 2.5*   No results for input(s): "LIPASE", "AMYLASE" in the  last 168 hours. Recent Labs  Lab 01/06/24 1512  AMMONIA 16    Coagulation Profile: No results for input(s): "INR", "PROTIME" in the last 168 hours.  Cardiac Enzymes: No results for input(s): "CKTOTAL", "CKMB", "CKMBINDEX", "TROPONINI" in the last 168 hours.  BNP (last 3 results) No results for input(s): "PROBNP" in the last 8760 hours.  Lipid Profile: No results for input(s): "CHOL", "HDL", "LDLCALC", "TRIG", "CHOLHDL", "LDLDIRECT" in the last 72 hours.  Thyroid Function Tests: No results for input(s): "TSH", "T4TOTAL", "FREET4", "T3FREE", "THYROIDAB" in the last 72 hours.  Anemia Panel: No results for input(s): "VITAMINB12", "FOLATE", "FERRITIN", "TIBC", "IRON", "RETICCTPCT" in the last 72 hours.  Urine analysis:    Component Value Date/Time   COLORURINE YELLOW 01/06/2024 2340   APPEARANCEUR HAZY (A) 01/06/2024 2340   LABSPEC 1.010 01/06/2024 2340   PHURINE 5.0 01/06/2024 2340   GLUCOSEU >=500 (A) 01/06/2024 2340   HGBUR SMALL (A) 01/06/2024 2340   BILIRUBINUR NEGATIVE 01/06/2024 2340   KETONESUR NEGATIVE 01/06/2024 2340   PROTEINUR 30 (A) 01/06/2024 2340   NITRITE NEGATIVE 01/06/2024 2340   LEUKOCYTESUR NEGATIVE 01/06/2024 2340    Sepsis Labs: Lactic Acid, Venous    Component Value Date/Time   LATICACIDVEN 2.5 (HH) 01/07/2024 0025    MICROBIOLOGY: Recent Results (from the past 240 hours)  Resp panel by RT-PCR (RSV, Flu A&B, Covid) Anterior Nasal Swab     Status: None   Collection Time: 01/06/24  3:07 PM   Specimen: Anterior Nasal Swab  Result Value Ref Range Status   SARS Coronavirus 2 by RT PCR NEGATIVE NEGATIVE Final   Influenza A by PCR NEGATIVE NEGATIVE Final   Influenza B by PCR NEGATIVE NEGATIVE Final    Comment: (NOTE) The Xpert Xpress SARS-CoV-2/FLU/RSV plus assay is intended as an aid in the diagnosis of influenza from Nasopharyngeal swab specimens and should not be used as a sole basis for treatment. Nasal washings and aspirates are  unacceptable for Xpert Xpress SARS-CoV-2/FLU/RSV testing.  Fact Sheet for Patients: BloggerCourse.com  Fact Sheet for Healthcare Providers: SeriousBroker.it  This test is not yet approved or cleared by the [place] FDA and has been authorized for detection  and/or diagnosis of SARS-CoV-2 by FDA under an Emergency Use Authorization (EUA). This EUA will remain in effect (meaning this test can be used) for the duration of the COVID-19 declaration under Section 564(b)(1) of the Act, 21 U.S.C. section 360bbb-3(b)(1), unless the authorization is terminated or revoked.     Resp Syncytial Virus by PCR NEGATIVE NEGATIVE Final    Comment: (NOTE) Fact Sheet for Patients: BloggerCourse.com  Fact Sheet for Healthcare Providers: SeriousBroker.it  This test is not yet approved or cleared by the [place] FDA and has been authorized for detection and/or diagnosis of SARS-CoV-2 by FDA under an Emergency Use Authorization (EUA). This EUA will remain in effect (meaning this test can be used) for the duration of the COVID-19 declaration under Section 564(b)(1) of the Act, 21 U.S.C. section 360bbb-3(b)(1), unless the authorization is terminated or revoked.  Performed at [facility], [address], [place], [place] [address]   Culture, blood (routine x 2)     Status: None (Preliminary result)   Collection Time: 01/07/24 12:18 AM   Specimen: BLOOD LEFT ARM  Result Value Ref Range Status   Specimen Description BLOOD LEFT ARM  Final   Special Requests   Final    BOTTLES DRAWN AEROBIC AND ANAEROBIC Blood Culture adequate volume   Culture   Final    NO GROWTH 3 DAYS Performed at [facility], [address], [place], [place] [address]    Report Status PENDING  Incomplete  Culture, blood (routine x 2)     Status: None (Preliminary result)   Collection Time: 01/07/24 12:20 AM    Specimen: BLOOD  Result Value Ref Range Status   Specimen Description BLOOD RIGHT ANTECUBITAL  Final   Special Requests   Final    BOTTLES DRAWN AEROBIC AND ANAEROBIC Blood Culture adequate volume   Culture  Setup Time   Final    GRAM NEGATIVE RODS GRAM POSITIVE COCCI AEROBIC BOTTLE ONLY CRITICAL RESULT CALLED TO, READ BACK BY AND VERIFIED WITH: PHARMD EMILY SINCLAIR ON 01/09/24 @ 1528 BY DRT    Culture   Final    GRAM NEGATIVE RODS TOO YOUNG TO READ Performed at [facility], [address], [place], [place] [address]    Report Status PENDING  Incomplete  Blood Culture ID Panel (Reflexed)     Status: Abnormal   Collection Time: 01/07/24 12:20 AM  Result Value Ref Range Status   Enterococcus faecalis NOT DETECTED NOT DETECTED Final   Enterococcus Faecium NOT DETECTED NOT DETECTED Final   Listeria monocytogenes NOT DETECTED NOT DETECTED Final   Staphylococcus species DETECTED (A) NOT DETECTED Final    Comment: CRITICAL RESULT CALLED TO, READ BACK BY AND VERIFIED WITH: PHARMD EMILY SINCLAIR ON 01/09/24 @ 1528 BY DRT    Staphylococcus aureus (BCID) NOT DETECTED NOT DETECTED Final   Staphylococcus epidermidis NOT DETECTED NOT DETECTED Final   Staphylococcus lugdunensis NOT DETECTED NOT DETECTED Final   Streptococcus species NOT DETECTED NOT DETECTED Final   Streptococcus agalactiae NOT DETECTED NOT DETECTED Final   Streptococcus pneumoniae NOT DETECTED NOT DETECTED Final   Streptococcus pyogenes NOT DETECTED NOT DETECTED Final   A.calcoaceticus-baumannii NOT DETECTED NOT DETECTED Final   Bacteroides fragilis NOT DETECTED NOT DETECTED Final   Enterobacterales NOT DETECTED NOT DETECTED Final   Enterobacter cloacae complex NOT DETECTED NOT DETECTED Final   Escherichia coli NOT DETECTED NOT DETECTED Final   Klebsiella aerogenes NOT DETECTED NOT DETECTED Final   Klebsiella oxytoca NOT  DETECTED NOT DETECTED Final   Klebsiella pneumoniae NOT DETECTED NOT DETECTED Final   Proteus  species NOT DETECTED NOT DETECTED Final   Salmonella species NOT DETECTED NOT DETECTED Final   Serratia marcescens NOT DETECTED NOT DETECTED Final   Haemophilus influenzae NOT DETECTED NOT DETECTED Final   Neisseria meningitidis NOT DETECTED NOT DETECTED Final   Pseudomonas aeruginosa NOT DETECTED NOT DETECTED Final   Stenotrophomonas maltophilia NOT DETECTED NOT DETECTED Final   Candida albicans NOT DETECTED NOT DETECTED Final   Candida auris NOT DETECTED NOT DETECTED Final   Candida glabrata NOT DETECTED NOT DETECTED Final   Candida krusei NOT DETECTED NOT DETECTED Final   Candida parapsilosis NOT DETECTED NOT DETECTED Final   Candida tropicalis NOT DETECTED NOT DETECTED Final   Cryptococcus neoformans/gattii NOT DETECTED NOT DETECTED Final    Comment: Performed at [facility], [address], [place], [place] [address]  Culture, blood (Routine X 2) w Reflex to ID Panel     Status: None (Preliminary result)   Collection Time: 01/09/24  9:50 PM   Specimen: BLOOD LEFT ARM  Result Value Ref Range Status   Specimen Description BLOOD LEFT ARM  Final   Special Requests   Final    BOTTLES DRAWN AEROBIC AND ANAEROBIC Blood Culture adequate volume   Culture   Final    NO GROWTH < 12 HOURS Performed at [facility], [address] [place], [place] [address]    Report Status PENDING  Incomplete  Culture, blood (Routine X 2) w Reflex to ID Panel     Status: None (Preliminary result)   Collection Time: 01/09/24 10:02 PM   Specimen: BLOOD LEFT HAND  Result Value Ref Range Status   Specimen Description BLOOD LEFT HAND  Final   Special Requests   Final    BOTTLES DRAWN AEROBIC AND ANAEROBIC Blood Culture adequate volume   Culture   Final    NO GROWTH < 12 HOURS Performed at [facility], [address] [place], [place] [address]    Report Status PENDING  Incomplete    RADIOLOGY STUDIES/RESULTS: DG CHEST PORT 1 VIEW Result Date: 01/10/2024 CLINICAL DATA:  Dyspnea  EXAM: PORTABLE CHEST 1 VIEW COMPARISON:  01/09/2024, 01/08/2024 FINDINGS: There is developing consolidation within the left mid and lower lung zone. Small cyst at left pleural effusion suspected. No pneumothorax. Right lung is clear. Cardiac size within limits. Pulmonary vascularity is normal. No acute. IMPRESSION: 1. Developing left mid and lower lung zone consolidation, likely pneumonia. Small left pleural effusion. Electronically Signed   By: Helyn Numbers M.D.   On: 01/10/2024 01:33   DG Chest Port 1V same Day Result Date: 01/09/2024 CLINICAL DATA:  [id number] SOB (shortness of breath) [id number] EXAM: PORTABLE CHEST - 1 VIEW SAME DAY COMPARISON:  01/08/2024 FINDINGS: Relatively low lung volumes with some mild left perihilar and basilar interstitial opacities. Right lung clear. Heart size and mediastinal contours are within normal limits. Aortic Atherosclerosis (ICD10-170.0). Blunting of left lateral costophrenic angle. IMPRESSION: Low volumes with mild left perihilar and basilar interstitial opacities. Electronically Signed   By: Corlis Leak M.D.   On: 01/09/2024 10:00     LOS: 3 days   Jeoffrey Massed, MD  [facility]    To contact the attending provider between 7A-7P or the covering provider during after hours 7P-7A, please log into the web site [email or web address] and access using universal [place] password for that web site. If you do not have  the password, please call the hospital operator.  01/10/2024, 10:29 AM

## 2024-01-10 NOTE — Progress Notes (Signed)
 Patient's was breathing 37-39 times a minute, his oxygen dropped to 87,. Patient was given 0.25mg  af ativan, oxygen was increased to 6 LNC up from 3, and sats, came up tp 97%, MD Avie Arenas has been notified.

## 2024-01-11 DIAGNOSIS — G252 Other specified forms of tremor: Secondary | ICD-10-CM | POA: Diagnosis not present

## 2024-01-11 DIAGNOSIS — A419 Sepsis, unspecified organism: Secondary | ICD-10-CM | POA: Diagnosis not present

## 2024-01-11 DIAGNOSIS — E870 Hyperosmolality and hypernatremia: Secondary | ICD-10-CM | POA: Diagnosis not present

## 2024-01-11 DIAGNOSIS — Z7189 Other specified counseling: Secondary | ICD-10-CM | POA: Diagnosis not present

## 2024-01-11 DIAGNOSIS — Z8679 Personal history of other diseases of the circulatory system: Secondary | ICD-10-CM | POA: Diagnosis not present

## 2024-01-11 DIAGNOSIS — R6510 Systemic inflammatory response syndrome (SIRS) of non-infectious origin without acute organ dysfunction: Secondary | ICD-10-CM | POA: Diagnosis not present

## 2024-01-11 DIAGNOSIS — Z515 Encounter for palliative care: Secondary | ICD-10-CM | POA: Diagnosis not present

## 2024-01-11 LAB — GLUCOSE, CAPILLARY
Glucose-Capillary: 297 mg/dL — ABNORMAL HIGH (ref 70–99)
Glucose-Capillary: 220 mg/dL — ABNORMAL HIGH (ref 70–99)
Glucose-Capillary: 264 mg/dL — ABNORMAL HIGH (ref 70–99)
Glucose-Capillary: 271 mg/dL — ABNORMAL HIGH (ref 70–99)
Glucose-Capillary: 216 mg/dL — ABNORMAL HIGH (ref 70–99)
Glucose-Capillary: 194 mg/dL — ABNORMAL HIGH (ref 70–99)

## 2024-01-11 LAB — COMPREHENSIVE METABOLIC PANEL WITH GFR
ALT: 33 U/L (ref 0–44)
AST: 41 U/L (ref 15–41)
Albumin: 1.9 g/dL — ABNORMAL LOW (ref 3.5–5.0)
Alkaline Phosphatase: 104 U/L (ref 38–126)
Anion gap: 8 (ref 5–15)
BUN: 41 mg/dL — ABNORMAL HIGH (ref 8–23)
CO2: 24 mmol/L (ref 22–32)
Calcium: 8.4 mg/dL — ABNORMAL LOW (ref 8.9–10.3)
Chloride: 111 mmol/L (ref 98–111)
Creatinine, Ser: 1.36 mg/dL — ABNORMAL HIGH (ref 0.61–1.24)
GFR, Estimated: 55 mL/min — ABNORMAL LOW (ref >60)
Glucose, Bld: 239 mg/dL — ABNORMAL HIGH (ref 70–99)
Potassium: 4.1 mmol/L (ref 3.5–5.1)
Sodium: 143 mmol/L (ref 135–145)
Total Bilirubin: 0.5 mg/dL (ref 0.0–1.2)
Total Protein: 5.3 g/dL — ABNORMAL LOW (ref 6.5–8.1)

## 2024-01-11 LAB — CBC
HCT: 24.9 % — ABNORMAL LOW (ref 39.0–52.0)
Hemoglobin: 7.3 g/dL — ABNORMAL LOW (ref 13.0–17.0)
MCH: 29.2 pg (ref 26.0–34.0)
MCHC: 29.3 g/dL — ABNORMAL LOW (ref 30.0–36.0)
MCV: 99.6 fL (ref 80.0–100.0)
Platelets: 157 10*3/uL (ref 150–400)
RBC: 2.5 MIL/uL — ABNORMAL LOW (ref 4.22–5.81)
RDW: 17.2 % — ABNORMAL HIGH (ref 11.5–15.5)
WBC: 12.1 10*3/uL — ABNORMAL HIGH (ref 4.0–10.5)
nRBC: 0 % (ref 0.0–0.2)

## 2024-01-11 MED ORDER — VANCOMYCIN HCL 500 MG/100ML IV SOLN
500.0000 mg | Freq: Once | INTRAVENOUS | Status: AC
  Administered 2024-01-11: 500 mg via INTRAVENOUS
  Filled 2024-01-11: qty 100

## 2024-01-11 NOTE — Progress Notes (Signed)
 PROGRESS NOTE        PATIENT DETAILS Name: William Todd Age: 73 y.o. Sex: male Date of Birth: Sep 28, 1951 Admit Date: 01/06/2024 Admitting Physician Dewayne Shorter Levora Dredge, MD VHQ:IONGE, Modena Slater, MD  Brief Summary: Patient is a 73 y.o.  male with history of recent hospitalization for ICH (2/15 to 3/20)-discharged to SNF-presented with fever and tremors.  Significant events: 3/25>> admit to [facility]-fever/tremors. 3/28>> agitated-gurgling at times-tachypneic-given IV Ativan-with significant improvement-much more calmer.  CXR with basilar infiltrates bilaterally.Suspicion for aspiration with agitation.  Antibiotics broadened to cefepime. 3/29>> overnight O2 saturations dropped to 85-more left-sided infiltrates on repeat CXR.  Vancomycin started.  Significant studies: 3/25>> CT head: Decrease size/density of left frontal ICH-decreasing SAH 3/25>> CXR: No PNA. 3/28>> CXR: Bibasilar interstitial opacities. 3/29>> CXR: Left mid/lower lobe consolidation.  Significant microbiology data: 3/25>> COVID/influenza/RSV PCR: Negative 3/26>> blood culture: Gram-positive cocci (likely contaminant), gram-negative rod 3/28>> blood culture: No growth.  Procedures: None  Consults: Palliative care  Subjective: No major issues overnight-appears unchanged-will get agitated with minimal touch at times.  Opens eyes and mumbles incoherently this morning.  Objective: Vitals: Blood pressure (!) 122/91, pulse 67, temperature (!) 97.3 F (36.3 C), temperature source Axillary, resp. rate 18, height 6' (1.829 m), weight 65.3 kg, SpO2 100%.   Exam: Gen Exam: Awake-mumbles incoherently-not in any distress.  Will get agitated/restless with gentle touch.  HEENT:atraumatic, normocephalic Chest: Transmitted upper airway sounds-moving air. CVS:S1S2 regular Abdomen:soft non tender, non distended Extremities:no edema Neurology: Physical exam but moves all 4 extremities.  l Skin: no  rash  Pertinent Labs/Radiology:    Latest Ref Rng & Units 01/11/2024    6:00 AM 01/10/2024    4:36 AM 01/09/2024    6:12 AM  CBC  WBC 4.0 - 10.5 K/uL 12.1  13.0  12.6   Hemoglobin 13.0 - 17.0 g/dL 7.3  7.2  8.2   Hematocrit 39.0 - 52.0 % 24.9  23.7  26.9   Platelets 150 - 400 K/uL 157  144  183     Lab Results  Component Value Date   NA 143 01/11/2024   K 4.1 01/11/2024   CL 111 01/11/2024   CO2 24 01/11/2024      Assessment/Plan: Sepsis likely secondary to presumed aspiration pneumonia-possible gram-negative bacteremia Initially clinically improved but subsequently deteriorated with sepsis physiology worsening/developing hypoxia-in significant left-sided infiltrates on imaging studies. Antibiotics have been broadened to vancomycin/cefepime Blood pressure was soft-and started on midodrine. Still awaiting speciation of gram-negative bacteria-however repeat blood cultures are negative so far Very difficult situation-recent prolonged hospitalization due to ICH-appears to have severe dysphagia and is clearly aspirating his secretions in spite of being n.p.o.  Palliative care following-spouse is resistant to comfort measures although patient is still in DNR Plan is to continue IV antibiotics-suction frequently as possible-and follow clinical course.  Concern that he may be getting close to an end-of-life situation.  Acute hypoxic respiratory failure Due to aspiration pneumonia-requiring oxygen up to 3-4 L.  Antibiotics as above-see discussion above.  Acute metabolic encephalopathy Suspect secondary to hypoxia/pneumonia-in a setting of recent large ICH. Calm at rest but if touched-gets agitated/restless Continue to treat underlying pneumonia Supportive care DNR in place-Per my discussion with spouse several days back-okay to use morphine/Ativan for severe agitation/comfort issues.    Hypernatremia Improved with free water via PEG tube.  AKI CKD stage IIIa Renal  function slowly  improving Continue PEG tube feeds/free water via PEG Avoid nephrotoxic agents.   Follow electrolytes.  Tremor Appears to be intentional-no tremors at rest Recently TSH on 2/15 stable. EEG-no seizures Could be a physiological response to febrile illness/recent hospitalization Neurology was curbsided by ED-recommendations were to switch metoprolol to propranolol. Need outpatient neurology evaluation if tremors continue after he recovers from his acute illness.  Normocytic anemia Secondary to acute illness Hb close to usual baseline when compared to his most recent hospitalization Follow closely-and transfuse if significant drop.  Recent left frontal ICH/SAH with cerebral edema CT showing interval improvement Continue supportive care  Dysphagia Continue tube feeds. Remains n.p.o. Suspect aspirated for the past several nights-and developed worsening sepsis physiology/hypoxia  Remains n.p.o. Has been started on Robinul Oral suctioning as much as possible Keep upright as much as possible.  DM-2 CBGs stable Semglee 7 units daily + SSI  Recent Labs    01/10/24 2341 01/11/24 0314 01/11/24 0742  GLUCAP 166* 297* 220*     HTN BP stable on midodrine Continuing propranolol-mostly for tremors. No longer on amlodipine   HLD Statin  Severe calorie malnutrition Tube feeds Nutrition evaluation appreciated.  Bilateral conjunctivitis Cipro eyedrops for a few days.  History of prostate cancer Outpatient follow-up with urology/oncology.   Deep Tissue Pressure Injury L heel Unstageable PI L lateral foot  Stage 2 Pressure Injury L hip Unstageable PI sacrum  POA Wound care per wound care RN.  Palliative care DNR in place Very difficult situation-recent ICH with resultant severe dysphagia-appears to have recurrent aspiration pneumonia during his prior hospitalization as well-during this hospitalization-likely aspirated even while n.p.o.-and has developed worsening sepsis  physiology/hypoxemia.  He is approaching end-of-life situation.  Spouse is resistant to comfort measures per palliative care team-patient remains a DNR.Plan is to allow time for clinical outcomes.  At this time-patient is receiving maximal medical treatment-and really no other means to escalate care.  Will continue to engage with family.   Code status:   Code Status: Limited: Do not attempt resuscitation (DNR) -DNR-LIMITED -Do Not Intubate/DNI    DVT Prophylaxis: enoxaparin (LOVENOX) injection 40 mg Start: 01/07/24 1000  Family Communication: Spouse at bedside on 3/28   Disposition Plan: Status is: Observation The patient will require care spanning > 2 midnights and should be moved to inpatient because: Severity of illness   Planned Discharge Destination:Skilled nursing facility   Diet: Diet Order     None         Antimicrobial agents: Anti-infectives (From admission, onward)    Start     Dose/Rate Route Frequency Ordered Stop   01/11/24 0300  vancomycin (VANCOREADY) IVPB 750 mg/150 mL        750 mg 150 mL/hr over 60 Minutes Intravenous Every 24 hours 01/10/24 1158     01/10/24 0300  vancomycin (VANCOCIN) IVPB 1000 mg/200 mL premix  Status:  Discontinued        1,000 mg 200 mL/hr over 60 Minutes Intravenous Every 24 hours 01/10/24 0155 01/10/24 1158   01/09/24 1700  ceFEPIme (MAXIPIME) 2 g in sodium chloride 0.9 % 100 mL IVPB        2 g 200 mL/hr over 30 Minutes Intravenous Every 12 hours 01/09/24 1608     01/09/24 1000  vancomycin (VANCOCIN) IVPB 1000 mg/200 mL premix  Status:  Discontinued        1,000 mg 200 mL/hr over 60 Minutes Intravenous Every 48 hours 01/07/24 0140 01/07/24 0858   01/09/24 0900  ceFAZolin (ANCEF) IVPB 2g/100 mL premix       Note to Pharmacy: Give in IR for surgical prophylaxis   2 g 200 mL/hr over 30 Minutes Intravenous To Radiology 01/09/24 0803 01/09/24 1137   01/08/24 0500  vancomycin (VANCOCIN) IVPB 1000 mg/200 mL premix  Status:   Discontinued        1,000 mg 200 mL/hr over 60 Minutes Intravenous Every 24 hours 01/07/24 0858 01/07/24 1144   01/07/24 1300  Ampicillin-Sulbactam (UNASYN) 3 g in sodium chloride 0.9 % 100 mL IVPB  Status:  Discontinued        3 g 200 mL/hr over 30 Minutes Intravenous Every 6 hours 01/07/24 1212 01/09/24 1608   01/07/24 0230  metroNIDAZOLE (FLAGYL) IVPB 500 mg  Status:  Discontinued        500 mg 100 mL/hr over 60 Minutes Intravenous 2 times daily 01/07/24 0121 01/07/24 1154   01/07/24 0230  vancomycin (VANCOREADY) IVPB 1250 mg/250 mL        1,250 mg 166.7 mL/hr over 90 Minutes Intravenous  Once 01/07/24 0121 01/07/24 0638   01/07/24 0200  ceFEPIme (MAXIPIME) 2 g in sodium chloride 0.9 % 100 mL IVPB  Status:  Discontinued        2 g 200 mL/hr over 30 Minutes Intravenous 2 times daily 01/07/24 0121 01/07/24 1154        MEDICATIONS: Scheduled Meds:  acetylcysteine  3 mL Nebulization BID   amantadine  100 mg Per Tube BID   atorvastatin  40 mg Per Tube q1800   ciprofloxacin  1 drop Both Eyes Q4H while awake   enoxaparin (LOVENOX) injection  40 mg Subcutaneous Daily   famotidine  20 mg Per Tube BID   feeding supplement (PROSource TF20)  60 mL Per Tube Daily   fiber supplement (BANATROL TF)  60 mL Per Tube BID   free water  400 mL Per Tube Q6H   Gerhardt's butt cream   Topical BID   glycopyrrolate  0.1 mg Intravenous TID   insulin aspart  0-9 Units Subcutaneous Q4H   insulin glargine-yfgn  7 Units Subcutaneous QHS   leptospermum manuka honey  1 Application Topical Daily   midodrine  10 mg Per Tube TID WC   multivitamin with minerals  1 tablet Per Tube Daily   polyethylene glycol  17 g Per Tube Daily   propranolol  40 mg Per Tube BID   senna-docusate  2 tablet Per Tube QHS   Continuous Infusions:  ceFEPime (MAXIPIME) IV 2 g (01/11/24 0621)   feeding supplement (OSMOLITE 1.5 CAL) 1,000 mL (01/09/24 1516)   vancomycin 750 mg (01/11/24 0330)   PRN Meds:.acetaminophen, bisacodyl,  levalbuterol, LORazepam, metoprolol tartrate, morphine injection, ondansetron **OR** ondansetron (ZOFRAN) IV   I have personally reviewed following labs and imaging studies  LABORATORY DATA: CBC: Recent Labs  Lab 01/06/24 1512 01/07/24 0422 01/08/24 0520 01/08/24 0748 01/09/24 0612 01/10/24 0436 01/11/24 0600  WBC 13.5*   < > 11.6* 11.6* 12.6* 13.0* 12.1*  NEUTROABS 12.0*  --   --   --   --  11.6*  --   HGB 7.9*   < > 8.7* 8.2* 8.2* 7.2* 7.3*  HCT 26.4*   < > 28.5* 27.9* 26.9* 23.7* 24.9*  MCV 97.8   < > 95.0 97.6 98.5 96.7 99.6  PLT 239   < > 200 199 183 144* 157   < > = values in this interval not displayed.    Basic Metabolic Panel: Recent  Labs  Lab 01/06/24 1512 01/07/24 0422 01/08/24 0748 01/09/24 0612 01/10/24 0436 01/11/24 0600  NA 147* 148* 150* 144 146* 143  K 4.2 4.3 3.5 4.0 3.9 4.1  CL 106 113* 114* 112* 113* 111  CO2 28 24 25 22 27 24   GLUCOSE 207* 210* 103* 119* 171* 239*  BUN 34* 27* 18 24* 41* 41*  CREATININE 1.54* 1.34* 1.36* 1.35* 1.73* 1.36*  CALCIUM 9.6 9.1 9.5 8.1* 8.3* 8.4*  MG 2.0  --  1.8  --   --   --   PHOS  --   --  2.7  --   --   --     GFR: Estimated Creatinine Clearance: 44.7 mL/min (A) (by C-G formula based on SCr of 1.36 mg/dL (H)).  Liver Function Tests: Recent Labs  Lab 01/06/24 1512 01/07/24 0422 01/08/24 0748 01/11/24 0600  AST 44* 36 36 41  ALT 90* 69* 57* 33  ALKPHOS 143* 132* 139* 104  BILITOT 0.6 0.8 0.7 0.5  PROT 6.7 6.3* 6.6 5.3*  ALBUMIN 2.5* 2.3* 2.5* 1.9*   No results for input(s): "LIPASE", "AMYLASE" in the last 168 hours. Recent Labs  Lab 01/06/24 1512  AMMONIA 16    Coagulation Profile: No results for input(s): "INR", "PROTIME" in the last 168 hours.  Cardiac Enzymes: No results for input(s): "CKTOTAL", "CKMB", "CKMBINDEX", "TROPONINI" in the last 168 hours.  BNP (last 3 results) No results for input(s): "PROBNP" in the last 8760 hours.  Lipid Profile: No results for input(s): "CHOL", "HDL",  "LDLCALC", "TRIG", "CHOLHDL", "LDLDIRECT" in the last 72 hours.  Thyroid Function Tests: No results for input(s): "TSH", "T4TOTAL", "FREET4", "T3FREE", "THYROIDAB" in the last 72 hours.  Anemia Panel: No results for input(s): "VITAMINB12", "FOLATE", "FERRITIN", "TIBC", "IRON", "RETICCTPCT" in the last 72 hours.  Urine analysis:    Component Value Date/Time   COLORURINE YELLOW 01/06/2024 2340   APPEARANCEUR HAZY (A) 01/06/2024 2340   LABSPEC 1.010 01/06/2024 2340   PHURINE 5.0 01/06/2024 2340   GLUCOSEU >=500 (A) 01/06/2024 2340   HGBUR SMALL (A) 01/06/2024 2340   BILIRUBINUR NEGATIVE 01/06/2024 2340   KETONESUR NEGATIVE 01/06/2024 2340   PROTEINUR 30 (A) 01/06/2024 2340   NITRITE NEGATIVE 01/06/2024 2340   LEUKOCYTESUR NEGATIVE 01/06/2024 2340    Sepsis Labs: Lactic Acid, Venous    Component Value Date/Time   LATICACIDVEN 2.5 (HH) 01/07/2024 0025    MICROBIOLOGY: Recent Results (from the past 240 hours)  Resp panel by RT-PCR (RSV, Flu A&B, Covid) Anterior Nasal Swab     Status: None   Collection Time: 01/06/24  3:07 PM   Specimen: Anterior Nasal Swab  Result Value Ref Range Status   SARS Coronavirus 2 by RT PCR NEGATIVE NEGATIVE Final   Influenza A by PCR NEGATIVE NEGATIVE Final   Influenza B by PCR NEGATIVE NEGATIVE Final    Comment: (NOTE) The Xpert Xpress SARS-CoV-2/FLU/RSV plus assay is intended as an aid in the diagnosis of influenza from Nasopharyngeal swab specimens and should not be used as a sole basis for treatment. Nasal washings and aspirates are unacceptable for Xpert Xpress SARS-CoV-2/FLU/RSV testing.  Fact Sheet for Patients: BloggerCourse.com  Fact Sheet for Healthcare Providers: SeriousBroker.it  This test is not yet approved or cleared by the [place] FDA and has been authorized for detection and/or diagnosis of SARS-CoV-2 by FDA under an Emergency Use Authorization (EUA). This EUA will  remain in effect (meaning this test can be used) for the duration of the COVID-19 declaration under  Section 564(b)(1) of the Act, 21 U.S.C. section 360bbb-3(b)(1), unless the authorization is terminated or revoked.     Resp Syncytial Virus by PCR NEGATIVE NEGATIVE Final    Comment: (NOTE) Fact Sheet for Patients: BloggerCourse.com  Fact Sheet for Healthcare Providers: SeriousBroker.it  This test is not yet approved or cleared by the [place] FDA and has been authorized for detection and/or diagnosis of SARS-CoV-2 by FDA under an Emergency Use Authorization (EUA). This EUA will remain in effect (meaning this test can be used) for the duration of the COVID-19 declaration under Section 564(b)(1) of the Act, 21 U.S.C. section 360bbb-3(b)(1), unless the authorization is terminated or revoked.  Performed at [facility], [address], [place], [place] [address]   Culture, blood (routine x 2)     Status: None (Preliminary result)   Collection Time: 01/07/24 12:18 AM   Specimen: BLOOD LEFT ARM  Result Value Ref Range Status   Specimen Description BLOOD LEFT ARM  Final   Special Requests   Final    BOTTLES DRAWN AEROBIC AND ANAEROBIC Blood Culture adequate volume   Culture   Final    NO GROWTH 4 DAYS Performed at [facility], [address], [place], [place] [address]    Report Status PENDING  Incomplete  Culture, blood (routine x 2)     Status: None (Preliminary result)   Collection Time: 01/07/24 12:20 AM   Specimen: BLOOD  Result Value Ref Range Status   Specimen Description BLOOD RIGHT ANTECUBITAL  Final   Special Requests   Final    BOTTLES DRAWN AEROBIC AND ANAEROBIC Blood Culture adequate volume   Culture  Setup Time   Final    GRAM NEGATIVE RODS GRAM POSITIVE COCCI AEROBIC BOTTLE ONLY CRITICAL RESULT CALLED TO, READ BACK BY AND VERIFIED WITH: PHARMD EMILY SINCLAIR ON 01/09/24 @ 1528 BY DRT    Culture    Final    GRAM NEGATIVE RODS TOO YOUNG TO READ Performed at [facility], [address] [place], [place] [address]    Report Status PENDING  Incomplete  Blood Culture ID Panel (Reflexed)     Status: Abnormal   Collection Time: 01/07/24 12:20 AM  Result Value Ref Range Status   Enterococcus faecalis NOT DETECTED NOT DETECTED Final   Enterococcus Faecium NOT DETECTED NOT DETECTED Final   Listeria monocytogenes NOT DETECTED NOT DETECTED Final   Staphylococcus species DETECTED (A) NOT DETECTED Final    Comment: CRITICAL RESULT CALLED TO, READ BACK BY AND VERIFIED WITH: PHARMD EMILY SINCLAIR ON 01/09/24 @ 1528 BY DRT    Staphylococcus aureus (BCID) NOT DETECTED NOT DETECTED Final   Staphylococcus epidermidis NOT DETECTED NOT DETECTED Final   Staphylococcus lugdunensis NOT DETECTED NOT DETECTED Final   Streptococcus species NOT DETECTED NOT DETECTED Final   Streptococcus agalactiae NOT DETECTED NOT DETECTED Final   Streptococcus pneumoniae NOT DETECTED NOT DETECTED Final   Streptococcus pyogenes NOT DETECTED NOT DETECTED Final   A.calcoaceticus-baumannii NOT DETECTED NOT DETECTED Final   Bacteroides fragilis NOT DETECTED NOT DETECTED Final   Enterobacterales NOT DETECTED NOT DETECTED Final   Enterobacter cloacae complex NOT DETECTED NOT DETECTED Final   Escherichia coli NOT DETECTED NOT DETECTED Final   Klebsiella aerogenes NOT DETECTED NOT DETECTED Final   Klebsiella oxytoca NOT DETECTED NOT DETECTED Final   Klebsiella pneumoniae NOT DETECTED NOT DETECTED Final   Proteus species NOT DETECTED NOT DETECTED Final   Salmonella species NOT DETECTED NOT DETECTED Final  Serratia marcescens NOT DETECTED NOT DETECTED Final   Haemophilus influenzae NOT DETECTED NOT DETECTED Final   Neisseria meningitidis NOT DETECTED NOT DETECTED Final   Pseudomonas aeruginosa NOT DETECTED NOT DETECTED Final   Stenotrophomonas maltophilia NOT DETECTED NOT DETECTED Final   Candida albicans NOT DETECTED  NOT DETECTED Final   Candida auris NOT DETECTED NOT DETECTED Final   Candida glabrata NOT DETECTED NOT DETECTED Final   Candida krusei NOT DETECTED NOT DETECTED Final   Candida parapsilosis NOT DETECTED NOT DETECTED Final   Candida tropicalis NOT DETECTED NOT DETECTED Final   Cryptococcus neoformans/gattii NOT DETECTED NOT DETECTED Final    Comment: Performed at [facility], [address], [place], [place] [address]  Culture, blood (Routine X 2) w Reflex to ID Panel     Status: None (Preliminary result)   Collection Time: 01/09/24  9:50 PM   Specimen: BLOOD LEFT ARM  Result Value Ref Range Status   Specimen Description BLOOD LEFT ARM  Final   Special Requests   Final    BOTTLES DRAWN AEROBIC AND ANAEROBIC Blood Culture adequate volume   Culture   Final    NO GROWTH 2 DAYS Performed at [facility], [address] [place], [place] [address]    Report Status PENDING  Incomplete  Culture, blood (Routine X 2) w Reflex to ID Panel     Status: None (Preliminary result)   Collection Time: 01/09/24 10:02 PM   Specimen: BLOOD LEFT HAND  Result Value Ref Range Status   Specimen Description BLOOD LEFT HAND  Final   Special Requests   Final    BOTTLES DRAWN AEROBIC AND ANAEROBIC Blood Culture adequate volume   Culture   Final    NO GROWTH 2 DAYS Performed at [facility], [address], [place], [place] [address]    Report Status PENDING  Incomplete    RADIOLOGY STUDIES/RESULTS: DG Chest Port 1 View Result Date: 01/10/2024 CLINICAL DATA:  Shortness of breath. EXAM: PORTABLE CHEST 1 VIEW COMPARISON:  Chest x-ray from same day at 0114 hours. FINDINGS: The patient is rotated to the left. Stable cardiomediastinal silhouette with normal heart size. Unchanged consolidation throughout the left lower lobe with obscuration of the left hemidiaphragm. The left heart border remains visible. Probable small left pleural effusion. Clear right lung. No pneumothorax. No acute osseous  abnormality. IMPRESSION: 1. Unchanged left lower lobe pneumonia. Electronically Signed   By: Obie Dredge M.D.   On: 01/10/2024 11:15   DG CHEST PORT 1 VIEW Result Date: 01/10/2024 CLINICAL DATA:  Dyspnea EXAM: PORTABLE CHEST 1 VIEW COMPARISON:  01/09/2024, 01/08/2024 FINDINGS: There is developing consolidation within the left mid and lower lung zone. Small cyst at left pleural effusion suspected. No pneumothorax. Right lung is clear. Cardiac size within limits. Pulmonary vascularity is normal. No acute. IMPRESSION: 1. Developing left mid and lower lung zone consolidation, likely pneumonia. Small left pleural effusion. Electronically Signed   By: Helyn Numbers M.D.   On: 01/10/2024 01:33     LOS: 4 days   Jeoffrey Massed, MD  [facility]    To contact the attending provider between 7A-7P or the covering provider during after hours 7P-7A, please log into the web site [email or web address] and access using universal [place] password for that web site. If you do not have the password, please call the hospital operator.  01/11/2024, 11:00 AM

## 2024-01-11 NOTE — Plan of Care (Signed)
  Problem: Nutritional: Goal: Maintenance of adequate nutrition will improve Outcome: Progressing Goal: Progress toward achieving an optimal weight will improve Outcome: Progressing   Problem: Skin Integrity: Goal: Risk for impaired skin integrity will decrease Outcome: Progressing   Problem: Clinical Measurements: Goal: Diagnostic test results will improve Outcome: Progressing   Problem: Respiratory: Goal: Ability to maintain adequate ventilation will improve Outcome: Progressing   Problem: Activity: Goal: Risk for activity intolerance will decrease Outcome: Progressing

## 2024-01-11 NOTE — Progress Notes (Signed)
 RT attempted to give breathing treatment but Pt would swat and grab aerosol mask. Pt VSS at this time.

## 2024-01-11 NOTE — Progress Notes (Addendum)
 Daily Progress Note   Patient Name: William Todd       Date: 01/11/2024 DOB: [birth year]  Age: 73 y.o. MRN#: [id number] Attending Physician: Maretta Bees, MD Primary Care Physician: Knox Royalty, MD Admit Date: 01/06/2024  Reason for Consultation/Follow-up: Establishing goals of care  Length of Stay: 4  Current Medications: Scheduled Meds:   acetylcysteine  3 mL Nebulization BID   amantadine  100 mg Per Tube BID   atorvastatin  40 mg Per Tube q1800   ciprofloxacin  1 drop Both Eyes Q4H while awake   enoxaparin (LOVENOX) injection  40 mg Subcutaneous Daily   famotidine  20 mg Per Tube BID   feeding supplement (PROSource TF20)  60 mL Per Tube Daily   fiber supplement (BANATROL TF)  60 mL Per Tube BID   free water  400 mL Per Tube Q6H   Gerhardt's butt cream   Topical BID   glycopyrrolate  0.1 mg Intravenous TID   insulin aspart  0-9 Units Subcutaneous Q4H   insulin glargine-yfgn  7 Units Subcutaneous QHS   leptospermum manuka honey  1 Application Topical Daily   midodrine  10 mg Per Tube TID WC   multivitamin with minerals  1 tablet Per Tube Daily   polyethylene glycol  17 g Per Tube Daily   propranolol  40 mg Per Tube BID   senna-docusate  2 tablet Per Tube QHS    Continuous Infusions:  ceFEPime (MAXIPIME) IV 2 g (01/11/24 0621)   feeding supplement (OSMOLITE 1.5 CAL) 1,000 mL (01/09/24 1516)   vancomycin     vancomycin 750 mg (01/11/24 0330)    PRN Meds: acetaminophen, bisacodyl, levalbuterol, LORazepam, metoprolol tartrate, morphine injection, ondansetron **OR** ondansetron (ZOFRAN) IV  Physical Exam Vitals reviewed.  Constitutional:      General: He is sleeping. He is not in acute distress.    Appearance: He is ill-appearing.  Cardiovascular:     Rate and  Rhythm: Normal rate.  Pulmonary:     Effort: Pulmonary effort is normal.  Skin:    General: Skin is dry.             Vital Signs: BP 122/65 (BP Location: Left Arm)   Pulse 68   Temp 97.6 F (36.4 C) (Oral)   Resp 20   Ht 6' (1.829  m)   Wt 65.3 kg   SpO2 98%   BMI 19.52 kg/m  SpO2: SpO2: 98 % O2 Device: O2 Device: Nasal Cannula O2 Flow Rate: O2 Flow Rate (L/min): 3 L/min    Patient Active Problem List   Diagnosis Date Noted   Coarse tremors 01/07/2024   Febrile illness 01/07/2024   Sepsis (HCC) 01/07/2024   History of intracranial hemorrhage and subarachnoid hemorrhage 11/29/23 01/07/2024   Decubitus ulcer 01/07/2024   S/P percutaneous endoscopic gastrostomy (PEG) tube placement 12/23/2023(HCC) 01/07/2024   Hypernatremia 01/07/2024   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   Acute conjunctivitis, bilateral 01/07/2024   Protein-calorie malnutrition, severe 12/01/2023   Intraparenchymal hemorrhage of brain (HCC) 11/30/2023   Hypertension 11/30/2023   History of prostate cancer 11/30/2023   Non-insulin dependent type 2 diabetes mellitus (HCC) 11/30/2023   Normocytic anemia 11/30/2023   History of mood disorder 11/30/2023   HLD (hyperlipidemia) 11/30/2023   Subarachnoid hemorrhage (HCC) 11/30/2023   Acute metabolic encephalopathy 11/30/2023   ICH (intracerebral hemorrhage) (HCC) 11/30/2023   Malnutrition of moderate degree 11/19/2023   History of closed left femoral fracture s/p arthroplasty (HCC) 11/15/2023   Abnormal head CT 11/15/2023   Long-term memory impairment 11/15/2023   Acute kidney injury superimposed on chronic kidney disease (HCC) 11/15/2023   Prostate cancer (HCC) 07/15/2013    Palliative Care Assessment & Plan   Patient Profile: 73 y.o. male  with past medical history of recent ICH, HTN, recurrent mechanical falls, protein calorie malnutrition, DM2, CKD, prostate cancer (s/p radiation), hip fracture s/p surgical intervention 11/16/23, anxiety, and  dementia admitted on 01/06/2024 from SNF with fever and tremors. Sepsis likely secondary to presumed aspiration pneumonia.    Last night patient had worsening respiratory status with worsening chest xray.   Patient is known to Palliative Medicine from previous hospitalization.  Today's Discussion: Patient lying in bed in upright position in NAD. He appears more comfortable than yesterday. I do not attempt to wake him as he can become agitated with stimulation. His wife Chyrl Civatte is at bedside. She says the patient has been upright which appears to be helping his breathing. She does not have questions or concerns about our conversation yesterday.   Reminded her that PMT is here to support the patient and family. Encouraged her to continue thinking about goals of care and if there could be a time where she would accept comfort measures.  Encouraged Joann to call PMT with questions or concerns. PMT will continue to follow.  Recommendations/Plan: DNR DNI Continue current plan of treating the treatable Encouraged family to continue discussing goals of care PMT will continue to follow   Code Status:    Code Status Orders  (From admission, onward)           Start     Ordered   01/07/24 0120  Do not attempt resuscitation (DNR)- Limited -Do Not Intubate (DNI)  Continuous       Question Answer Comment  If pulseless and not breathing No CPR or chest compressions.   In Pre-Arrest Conditions (Patient Is Breathing and Has A Pulse) Do not intubate. Provide all appropriate non-invasive medical interventions. Avoid ICU transfer unless indicated or required.   Consent: Discussion documented in EHR or advanced directives reviewed      01/07/24 0121            Extensive chart review has been completed prior to seeing the patient including labs, vital signs, imaging, progress/consult notes, orders, medications, and available advance directive documents.  Care plan was discussed with bedside RN  and Dr. Jerral Ralph  Time spent: 35 minutes  Thank you for allowing the Palliative Medicine Team to assist in the care of this patient.   Sherryll Burger, NP  Please contact Palliative Medicine Team phone at [phone] for questions and concerns.

## 2024-01-11 NOTE — Progress Notes (Signed)
 Pharmacy Antibiotic Note  William Todd is a 73 y.o. male admitted on 01/06/2024 with sepsis/PNA.  Pharmacy has been consulted for Vancomycin and Cefepime  dosing.  Had initially improved but now much worse-more hypoxic-continues to have leukocytosis-looks like he may have aspirated again last night. Antibiotics have now been broadened to vancomycin/cefepime. WBC 12, afebrile, and CrCl 44.7 mL/min.   Plan: Change vancomycin to 1250 mg IV q24h (eAUC 549 using Scr 1.36, TBW, Vd 0.72) - IBW: 78 kg so TBW <IBW  Continue cefepime 2 g IV q12h Monitor clinical improvement, CBC, and renal function   Height: 6' (182.9 cm) Weight: 65.3 kg (143 lb 15.4 oz) IBW/kg (Calculated) : 77.6  Temp (24hrs), Avg:97.9 F (36.6 C), Min:97.3 F (36.3 C), Max:98.6 F (37 C)  Recent Labs  Lab 01/07/24 0018 01/07/24 0025 01/07/24 0422 01/08/24 0520 01/08/24 0748 01/09/24 0612 01/10/24 0436 01/11/24 0600  WBC  --   --  10.2 11.6* 11.6* 12.6* 13.0* 12.1*  CREATININE  --   --  1.34*  --  1.36* 1.35* 1.73* 1.36*  LATICACIDVEN 2.3* 2.5*  --   --   --   --   --   --     Estimated Creatinine Clearance: 44.7 mL/min (A) (by C-G formula based on SCr of 1.36 mg/dL (H)).    No Known Allergies  Vanco 3/26 >> Flagyl 3/26 >> 4/1 Cefepime 3/26 >>   3/25>> COVID/influenza/RSV PCR: Negative 3/26>> blood culture: Gram-positive cocci (likely contaminant), gram-negative rod 3/28>> blood culture: No growth  Roslyn Smiling, PharmD PGY1 Pharmacy Resident 01/11/2024 12:27 PM

## 2024-01-11 NOTE — Plan of Care (Signed)
  Problem: Coping: Goal: Ability to adjust to condition or change in health will improve Outcome: Progressing   Problem: Skin Integrity: Goal: Risk for impaired skin integrity will decrease Outcome: Progressing   Problem: Nutritional: Goal: Maintenance of adequate nutrition will improve Outcome: Progressing   Problem: Respiratory: Goal: Ability to maintain adequate ventilation will improve Outcome: Progressing   Problem: Elimination: Goal: Will not experience complications related to bowel motility Outcome: Progressing Goal: Will not experience complications related to urinary retention Outcome: Progressing   Problem: Pain Managment: Goal: General experience of comfort will improve and/or be controlled Outcome: Progressing   Problem: Safety: Goal: Ability to remain free from injury will improve Outcome: Progressing   Problem: Skin Integrity: Goal: Risk for impaired skin integrity will decrease Outcome: Progressing

## 2024-01-12 ENCOUNTER — Inpatient Hospital Stay (HOSPITAL_COMMUNITY)

## 2024-01-12 DIAGNOSIS — Z8679 Personal history of other diseases of the circulatory system: Secondary | ICD-10-CM | POA: Diagnosis not present

## 2024-01-12 DIAGNOSIS — E870 Hyperosmolality and hypernatremia: Secondary | ICD-10-CM | POA: Diagnosis not present

## 2024-01-12 DIAGNOSIS — G252 Other specified forms of tremor: Secondary | ICD-10-CM | POA: Diagnosis not present

## 2024-01-12 DIAGNOSIS — A419 Sepsis, unspecified organism: Secondary | ICD-10-CM | POA: Diagnosis not present

## 2024-01-12 LAB — CBC
HCT: 23.7 % — ABNORMAL LOW (ref 39.0–52.0)
Hemoglobin: 7 g/dL — ABNORMAL LOW (ref 13.0–17.0)
MCH: 29.3 pg (ref 26.0–34.0)
MCHC: 29.5 g/dL — ABNORMAL LOW (ref 30.0–36.0)
MCV: 99.2 fL (ref 80.0–100.0)
Platelets: 152 10*3/uL (ref 150–400)
RBC: 2.39 MIL/uL — ABNORMAL LOW (ref 4.22–5.81)
RDW: 17.3 % — ABNORMAL HIGH (ref 11.5–15.5)
WBC: 10.7 10*3/uL — ABNORMAL HIGH (ref 4.0–10.5)
nRBC: 0 % (ref 0.0–0.2)

## 2024-01-12 LAB — BASIC METABOLIC PANEL WITH GFR
Anion gap: 14 (ref 5–15)
BUN: 37 mg/dL — ABNORMAL HIGH (ref 8–23)
CO2: 18 mmol/L — ABNORMAL LOW (ref 22–32)
Calcium: 8.4 mg/dL — ABNORMAL LOW (ref 8.9–10.3)
Chloride: 109 mmol/L (ref 98–111)
Creatinine, Ser: 1.16 mg/dL (ref 0.61–1.24)
GFR, Estimated: >60 mL/min (ref >60)
Glucose, Bld: 339 mg/dL — ABNORMAL HIGH (ref 70–99)
Potassium: 4.4 mmol/L (ref 3.5–5.1)
Sodium: 141 mmol/L (ref 135–145)

## 2024-01-12 LAB — CULTURE, BLOOD (ROUTINE X 2)
Culture: NO GROWTH
Special Requests: ADEQUATE

## 2024-01-12 LAB — GLUCOSE, CAPILLARY
Glucose-Capillary: 210 mg/dL — ABNORMAL HIGH (ref 70–99)
Glucose-Capillary: 211 mg/dL — ABNORMAL HIGH (ref 70–99)
Glucose-Capillary: 246 mg/dL — ABNORMAL HIGH (ref 70–99)
Glucose-Capillary: 283 mg/dL — ABNORMAL HIGH (ref 70–99)
Glucose-Capillary: 300 mg/dL — ABNORMAL HIGH (ref 70–99)
Glucose-Capillary: 315 mg/dL — ABNORMAL HIGH (ref 70–99)

## 2024-01-12 MED ORDER — INSULIN GLARGINE-YFGN 100 UNIT/ML ~~LOC~~ SOLN
12.0000 [IU] | Freq: Every day | SUBCUTANEOUS | Status: DC
  Administered 2024-01-12 – 2024-01-13 (×2): 12 [IU] via SUBCUTANEOUS
  Filled 2024-01-12 (×3): qty 0.12

## 2024-01-12 MED ORDER — INSULIN ASPART 100 UNIT/ML IJ SOLN
0.0000 [IU] | INTRAMUSCULAR | Status: DC
  Administered 2024-01-12: 8 [IU] via SUBCUTANEOUS
  Administered 2024-01-12 (×3): 5 [IU] via SUBCUTANEOUS
  Administered 2024-01-13 (×2): 3 [IU] via SUBCUTANEOUS
  Administered 2024-01-13 (×2): 8 [IU] via SUBCUTANEOUS
  Administered 2024-01-13: 11 [IU] via SUBCUTANEOUS
  Administered 2024-01-13 – 2024-01-14 (×2): 2 [IU] via SUBCUTANEOUS
  Administered 2024-01-14: 3 [IU] via SUBCUTANEOUS
  Administered 2024-01-14: 8 [IU] via SUBCUTANEOUS
  Administered 2024-01-14: 2 [IU] via SUBCUTANEOUS

## 2024-01-12 NOTE — Plan of Care (Signed)
  Problem: Coping: Goal: Ability to adjust to condition or change in health will improve Outcome: Progressing   Problem: Skin Integrity: Goal: Risk for impaired skin integrity will decrease Outcome: Progressing   Problem: Respiratory: Goal: Ability to maintain adequate ventilation will improve Outcome: Progressing   Problem: Nutrition: Goal: Adequate nutrition will be maintained Outcome: Progressing   Problem: Coping: Goal: Level of anxiety will decrease Outcome: Progressing   Problem: Elimination: Goal: Will not experience complications related to bowel motility Outcome: Progressing Goal: Will not experience complications related to urinary retention Outcome: Progressing   Problem: Pain Managment: Goal: General experience of comfort will improve and/or be controlled Outcome: Progressing   Problem: Skin Integrity: Goal: Risk for impaired skin integrity will decrease Outcome: Progressing

## 2024-01-12 NOTE — Progress Notes (Signed)
 PT Cancellation Note  Patient Details Name: William Todd MRN: [id number] DOB: 03-08-1951   Cancelled Treatment:    Reason Eval/Treat Not Completed: Medical issues which prohibited therapy  Entered room at 1623, noted rapid breathing RR in mid 30s, sats 87% at nadir, rapid facial twitching, some twitching in LUE as well. Eyes open, fixed forward/left, not responding to voice or sternal rub. No blink reflex. RN notified, witnessed by CNA shortly before nursing arrived to assume care.  Of note, I visited pt approx 30 min prior to introduce myself and he was able to turn his head towards the right when I spoke to him.  Kathlyn Sacramento, PT, DPT [facility]  Rehabilitation Services Physical Therapist Office: [phone] Website: [email or web address]    Berton Mount 01/12/2024, 4:31 PM

## 2024-01-12 NOTE — Care Management Important Message (Signed)
 Important Message  Patient Details  Name: William Todd MRN: [id number] Date of Birth: 1951-02-21   Important Message Given:  Yes - Medicare IM     [name] 01/12/2024, 4:26 PM

## 2024-01-12 NOTE — Plan of Care (Signed)
  Problem: Fluid Volume: Goal: Ability to maintain a balanced intake and output will improve Outcome: Progressing   Problem: Nutritional: Goal: Maintenance of adequate nutrition will improve Outcome: Progressing   Problem: Skin Integrity: Goal: Risk for impaired skin integrity will decrease Outcome: Progressing   Problem: Clinical Measurements: Goal: Diagnostic test results will improve Outcome: Progressing   Problem: Respiratory: Goal: Ability to maintain adequate ventilation will improve Outcome: Progressing

## 2024-01-12 NOTE — TOC Progression Note (Signed)
 Transition of Care [facility]) - Progression Note    Patient Details  Name: William Todd MRN: [id number] Date of Birth: 1951/01/13  [facility] CM/SW Contact  Alesia Richards, RN 01/12/2024, 11:37 AM  Clinical Narrative:     CM discussed case with Dr. Jerral Ralph regarding discharge care planning.Hospital day 6 with diagnosis of Sepsis. Patient is from [facility]. Per [place], patient not medically ready to return to SNF due to fevering; temp 101 this morning and aspiration. Patient remains on IV Vancomycin and Cefepime. Palliative Care continues to follow. Patient is DNR.  Expected Discharge Plan: Skilled Nursing Facility Barriers to Discharge: Continued Medical Work up  Expected Discharge Plan and Services In-house Referral: Clinical Social Work     Living arrangements for the past 2 months: Single Family Home, Skilled Nursing Facility                   Social Determinants of Health (SDOH) Interventions SDOH Screenings   Food Insecurity: Patient Unable To Answer (01/07/2024)  Housing: Patient Unable To Answer (01/07/2024)  Transportation Needs: Patient Unable To Answer (01/07/2024)  Utilities: Patient Unable To Answer (01/07/2024)  Social Connections: Patient Unable To Answer (01/07/2024)  Tobacco Use: Low Risk  (01/06/2024)    Readmission Risk Interventions    12/08/2023   12:40 PM  Readmission Risk Prevention Plan  Transportation Screening Complete  Medication Review (RN Care Manager) Complete  PCP or Specialist appointment within 3-5 days of discharge Complete  HRI or Home Care Consult Complete  SW Recovery Care/Counseling Consult Complete  Palliative Care Screening Complete  Skilled Nursing Facility Complete

## 2024-01-12 NOTE — Progress Notes (Addendum)
 PROGRESS NOTE        PATIENT DETAILS Name: William Todd Age: 73 y.o. Sex: male Date of Birth: May 24, 1951 Admit Date: 01/06/2024 Admitting Physician Dewayne Shorter Levora Dredge, MD [id number], Modena Slater, MD  Brief Summary: Patient is a 73 y.o.  male with history of recent hospitalization for ICH (2/15 to 3/20)-discharged to SNF-presented with fever and tremors.  Significant events: 3/25>> admit to [facility]-fever/tremors. 3/28>> agitated-gurgling at times-tachypneic-given IV Ativan-with significant improvement-much more calmer.  CXR with basilar infiltrates bilaterally.Suspicion for aspiration with agitation.  Antibiotics broadened to cefepime. 3/29>> overnight O2 saturations dropped to 85-more left-sided infiltrates on repeat CXR.  Vancomycin started.  Significant studies: 3/25>> CT head: Decrease size/density of left frontal ICH-decreasing SAH 3/25>> CXR: No PNA. 3/28>> CXR: Bibasilar interstitial opacities. 3/29>> CXR: Left mid/lower lobe consolidation.  Significant microbiology data: 3/25>> COVID/influenza/RSV PCR: Negative 3/26>> blood culture: Gram-positive cocci (likely contaminant), gram-negative rod 3/28>> blood culture: No growth.  Procedures: None  Consults: Palliative care  Subjective: No major events overnight-but febrile to 101 F this morning-agitated/restless.  Objective: Vitals: Blood pressure (!) 160/94, pulse 78, temperature (!) 101.3 F (38.5 C), resp. rate (!) 35, height 6' (1.829 m), weight 65.3 kg, SpO2 99%.   Exam: Gen Exam: Restless/agitated-but not in any distress. HEENT:atraumatic, normocephalic Chest: Transmitted upper airway sounds in bilateral upper lobes. CVS:S1S2 regular Abdomen:soft non tender, non distended Extremities:no edema Neurology: Difficult exam-but moving all 4 extremities. Skin: no rash  Pertinent Labs/Radiology:    Latest Ref Rng & Units 01/12/2024    4:15 AM 01/11/2024    6:00 AM 01/10/2024    4:36 AM  CBC   WBC 4.0 - 10.5 K/uL 10.7  12.1  13.0   Hemoglobin 13.0 - 17.0 g/dL 7.0  7.3  7.2   Hematocrit 39.0 - 52.0 % 23.7  24.9  23.7   Platelets 150 - 400 K/uL 152  157  144     Lab Results  Component Value Date   NA 141 01/12/2024   K 4.4 01/12/2024   CL 109 01/12/2024   CO2 18 (L) 01/12/2024      Assessment/Plan: Sepsis likely secondary to presumed aspiration pneumonia-possible gram-negative bacteremia Unfortunately still febrile-suspect this is secondary to ongoing aspiration-has significant left-sided infiltrates Very difficult situation-really no further treatment options-he recently had a stroke-and appears to have severe oropharyngeal dysphagia and is aspirating his secretions. For now continue with vancomycin/cefepime May need to do a CT chest-if he is still febrile to assess for empyema/effusion. Very poor overall prognosis-spouse is resistant to comfort measures-he is a DNR.  Have discussed at length with spouse over the past several days that if he continues to aspirate his secretions in spite of maximal medical treatment/suctioning/positioning/Robinul use-apart from transitioning to comfort measures really no other options available.  I still continue to think that he is very close to end-of-life-and would best benefit from being transitioned to full comfort measures. Palliative care following.  Acute hypoxic respiratory failure Due to aspiration pneumonia-requiring oxygen up to 3-4 L.  Antibiotics as above-see discussion above.  Acute metabolic encephalopathy Suspect secondary to hypoxia/pneumonia-in a setting of recent large ICH. More restless/agitated/coarse tremors this morning-suspect this is from fever Try Tylenol to see if this will make him a bit more comfortable-if not-I have asked nursing staff to use as needed Ativan/morphine.   Please note-Per my discussion with spouse several days back-okay  to use morphine/Ativan for severe agitation/restlessness/comfort  issues.  Hypernatremia Improved with free water via PEG tube.  AKI CKD stage IIIa Renal function slowly improving Continue PEG tube feeds/free water via PEG Avoid nephrotoxic agents.   Follow electrolytes.  Tremor Appears to be intentional-no tremors at rest Recently TSH on 2/15 stable. EEG-no seizures Could be a physiological response to febrile illness/recent hospitalization Neurology was curbsided by ED-recommendations were to switch metoprolol to propranolol. Need outpatient neurology evaluation if tremors continue after he recovers from his acute illness.  Normocytic anemia Secondary to acute illness Hb slowly downtrending-probably will require a PRBC transfusion if any further drop. Follow closely  Recent left frontal ICH/SAH with cerebral edema CT showing interval improvement Continue supportive care  Dysphagia Continue tube feeds. Remains n.p.o. Suspect continues to aspirate-in spite of positioning/suctioning/Robinul G-tube use/n.p.o. status Supportive care-see above regarding palliative care/end-of-life.  DM-2 CBGs on the higher side Increase Semglee to 12 units Change SSI to moderate scale Not a candidate for aggressive glycemic control.   Recent Labs    01/11/24 2259 01/12/24 0325 01/12/24 0759  GLUCAP 194* 315* 211*     HTN BP stable on midodrine Continuing propranolol-mostly for tremors. No longer on amlodipine  HLD Statin  Severe calorie malnutrition Tube feeds Nutrition evaluation appreciated.  Bilateral conjunctivitis Cipro eyedrops for a few days.  History of prostate cancer Outpatient follow-up with urology/oncology.   Deep Tissue Pressure Injury L heel Unstageable PI L lateral foot  Stage 2 Pressure Injury L hip Unstageable PI sacrum  POA Wound care per wound care RN.  Palliative care DNR in place Very difficult situation-recent ICH with resultant severe dysphagia-appears to have recurrent aspiration pneumonia during his  prior hospitalization as well-during this hospitalization-likely aspirated even while n.p.o.-and has developed worsening sepsis physiology/hypoxemia.  He is approaching end-of-life situation.  Spouse is resistant to comfort measures per palliative care team-patient remains a DNR.Plan is to allow time for clinical outcomes.  At this time-patient is receiving maximal medical treatment-and really no other means to escalate care.  Will continue to engage with family.   Code status:   Code Status: Limited: Do not attempt resuscitation (DNR) -DNR-LIMITED -Do Not Intubate/DNI    DVT Prophylaxis: enoxaparin (LOVENOX) injection 40 mg Start: 01/07/24 1000  Family Communication: Spouse at bedside on 3/31   Disposition Plan: Status is: Observation The patient will require care spanning > 2 midnights and should be moved to inpatient because: Severity of illness   Planned Discharge Destination:Skilled nursing facility   Diet: Diet Order     None         Antimicrobial agents: Anti-infectives (From admission, onward)    Start     Dose/Rate Route Frequency Ordered Stop   01/11/24 1330  vancomycin (VANCOREADY) IVPB 500 mg/100 mL        500 mg 100 mL/hr over 60 Minutes Intravenous  Once 01/11/24 1230 01/11/24 1512   01/11/24 0300  vancomycin (VANCOREADY) IVPB 750 mg/150 mL        750 mg 150 mL/hr over 60 Minutes Intravenous Every 24 hours 01/10/24 1158     01/10/24 0300  vancomycin (VANCOCIN) IVPB 1000 mg/200 mL premix  Status:  Discontinued        1,000 mg 200 mL/hr over 60 Minutes Intravenous Every 24 hours 01/10/24 0155 01/10/24 1158   01/09/24 1700  ceFEPIme (MAXIPIME) 2 g in sodium chloride 0.9 % 100 mL IVPB        2 g 200 mL/hr over 30 Minutes Intravenous  Every 12 hours 01/09/24 1608     01/09/24 1000  vancomycin (VANCOCIN) IVPB 1000 mg/200 mL premix  Status:  Discontinued        1,000 mg 200 mL/hr over 60 Minutes Intravenous Every 48 hours 01/07/24 0140 01/07/24 0858   01/09/24  0900  ceFAZolin (ANCEF) IVPB 2g/100 mL premix       Note to Pharmacy: Give in IR for surgical prophylaxis   2 g 200 mL/hr over 30 Minutes Intravenous To Radiology 01/09/24 0803 01/09/24 1137   01/08/24 0500  vancomycin (VANCOCIN) IVPB 1000 mg/200 mL premix  Status:  Discontinued        1,000 mg 200 mL/hr over 60 Minutes Intravenous Every 24 hours 01/07/24 0858 01/07/24 1144   01/07/24 1300  Ampicillin-Sulbactam (UNASYN) 3 g in sodium chloride 0.9 % 100 mL IVPB  Status:  Discontinued        3 g 200 mL/hr over 30 Minutes Intravenous Every 6 hours 01/07/24 1212 01/09/24 1608   01/07/24 0230  metroNIDAZOLE (FLAGYL) IVPB 500 mg  Status:  Discontinued        500 mg 100 mL/hr over 60 Minutes Intravenous 2 times daily 01/07/24 0121 01/07/24 1154   01/07/24 0230  vancomycin (VANCOREADY) IVPB 1250 mg/250 mL        1,250 mg 166.7 mL/hr over 90 Minutes Intravenous  Once 01/07/24 0121 01/07/24 0638   01/07/24 0200  ceFEPIme (MAXIPIME) 2 g in sodium chloride 0.9 % 100 mL IVPB  Status:  Discontinued        2 g 200 mL/hr over 30 Minutes Intravenous 2 times daily 01/07/24 0121 01/07/24 1154        MEDICATIONS: Scheduled Meds:  acetylcysteine  3 mL Nebulization BID   amantadine  100 mg Per Tube BID   atorvastatin  40 mg Per Tube q1800   ciprofloxacin  1 drop Both Eyes Q4H while awake   enoxaparin (LOVENOX) injection  40 mg Subcutaneous Daily   famotidine  20 mg Per Tube BID   feeding supplement (PROSource TF20)  60 mL Per Tube Daily   fiber supplement (BANATROL TF)  60 mL Per Tube BID   free water  400 mL Per Tube Q6H   Gerhardt's butt cream   Topical BID   glycopyrrolate  0.1 mg Intravenous TID   insulin aspart  0-15 Units Subcutaneous Q4H   insulin glargine-yfgn  12 Units Subcutaneous QHS   leptospermum manuka honey  1 Application Topical Daily   midodrine  10 mg Per Tube TID WC   multivitamin with minerals  1 tablet Per Tube Daily   polyethylene glycol  17 g Per Tube Daily   propranolol   40 mg Per Tube BID   senna-docusate  2 tablet Per Tube QHS   Continuous Infusions:  ceFEPime (MAXIPIME) IV 2 g (01/12/24 0429)   feeding supplement (OSMOLITE 1.5 CAL) 50 mL/hr at 01/12/24 0314   vancomycin 750 mg (01/12/24 0312)   PRN Meds:.acetaminophen, bisacodyl, levalbuterol, LORazepam, metoprolol tartrate, morphine injection, ondansetron **OR** ondansetron (ZOFRAN) IV   I have personally reviewed following labs and imaging studies  LABORATORY DATA: CBC: Recent Labs  Lab 01/06/24 1512 01/07/24 0422 01/08/24 0748 01/09/24 0612 01/10/24 0436 01/11/24 0600 01/12/24 0415  WBC 13.5*   < > 11.6* 12.6* 13.0* 12.1* 10.7*  NEUTROABS 12.0*  --   --   --  11.6*  --   --   HGB 7.9*   < > 8.2* 8.2* 7.2* 7.3* 7.0*  HCT 26.4*   < >  27.9* 26.9* 23.7* 24.9* 23.7*  MCV 97.8   < > 97.6 98.5 96.7 99.6 99.2  PLT 239   < > 199 183 144* 157 152   < > = values in this interval not displayed.    Basic Metabolic Panel: Recent Labs  Lab 01/06/24 1512 01/07/24 0422 01/08/24 0748 01/09/24 0612 01/10/24 0436 01/11/24 0600 01/12/24 0415  NA 147*   < > 150* 144 146* 143 141  K 4.2   < > 3.5 4.0 3.9 4.1 4.4  CL 106   < > 114* 112* 113* 111 109  CO2 28   < > 25 22 27 24  18*  GLUCOSE 207*   < > 103* 119* 171* 239* 339*  BUN 34*   < > 18 24* 41* 41* 37*  CREATININE 1.54*   < > 1.36* 1.35* 1.73* 1.36* 1.16  CALCIUM 9.6   < > 9.5 8.1* 8.3* 8.4* 8.4*  MG 2.0  --  1.8  --   --   --   --   PHOS  --   --  2.7  --   --   --   --    < > = values in this interval not displayed.    GFR: Estimated Creatinine Clearance: 52.4 mL/min (by C-G formula based on SCr of 1.16 mg/dL).  Liver Function Tests: Recent Labs  Lab 01/06/24 1512 01/07/24 0422 01/08/24 0748 01/11/24 0600  AST 44* 36 36 41  ALT 90* 69* 57* 33  ALKPHOS 143* 132* 139* 104  BILITOT 0.6 0.8 0.7 0.5  PROT 6.7 6.3* 6.6 5.3*  ALBUMIN 2.5* 2.3* 2.5* 1.9*   No results for input(s): "LIPASE", "AMYLASE" in the last 168 hours. Recent  Labs  Lab 01/06/24 1512  AMMONIA 16    Coagulation Profile: No results for input(s): "INR", "PROTIME" in the last 168 hours.  Cardiac Enzymes: No results for input(s): "CKTOTAL", "CKMB", "CKMBINDEX", "TROPONINI" in the last 168 hours.  BNP (last 3 results) No results for input(s): "PROBNP" in the last 8760 hours.  Lipid Profile: No results for input(s): "CHOL", "HDL", "LDLCALC", "TRIG", "CHOLHDL", "LDLDIRECT" in the last 72 hours.  Thyroid Function Tests: No results for input(s): "TSH", "T4TOTAL", "FREET4", "T3FREE", "THYROIDAB" in the last 72 hours.  Anemia Panel: No results for input(s): "VITAMINB12", "FOLATE", "FERRITIN", "TIBC", "IRON", "RETICCTPCT" in the last 72 hours.  Urine analysis:    Component Value Date/Time   COLORURINE YELLOW 01/06/2024 2340   APPEARANCEUR HAZY (A) 01/06/2024 2340   LABSPEC 1.010 01/06/2024 2340   PHURINE 5.0 01/06/2024 2340   GLUCOSEU >=500 (A) 01/06/2024 2340   HGBUR SMALL (A) 01/06/2024 2340   BILIRUBINUR NEGATIVE 01/06/2024 2340   KETONESUR NEGATIVE 01/06/2024 2340   PROTEINUR 30 (A) 01/06/2024 2340   NITRITE NEGATIVE 01/06/2024 2340   LEUKOCYTESUR NEGATIVE 01/06/2024 2340    Sepsis Labs: Lactic Acid, Venous    Component Value Date/Time   LATICACIDVEN 2.5 (HH) 01/07/2024 0025    MICROBIOLOGY: Recent Results (from the past 240 hours)  Resp panel by RT-PCR (RSV, Flu A&B, Covid) Anterior Nasal Swab     Status: None   Collection Time: 01/06/24  3:07 PM   Specimen: Anterior Nasal Swab  Result Value Ref Range Status   SARS Coronavirus 2 by RT PCR NEGATIVE NEGATIVE Final   Influenza A by PCR NEGATIVE NEGATIVE Final   Influenza B by PCR NEGATIVE NEGATIVE Final    Comment: (NOTE) The Xpert Xpress SARS-CoV-2/FLU/RSV plus assay is intended as an aid in  the diagnosis of influenza from Nasopharyngeal swab specimens and should not be used as a sole basis for treatment. Nasal washings and aspirates are unacceptable for Xpert Xpress  SARS-CoV-2/FLU/RSV testing.  Fact Sheet for Patients: BloggerCourse.com  Fact Sheet for Healthcare Providers: SeriousBroker.it  This test is not yet approved or cleared by the [place] FDA and has been authorized for detection and/or diagnosis of SARS-CoV-2 by FDA under an Emergency Use Authorization (EUA). This EUA will remain in effect (meaning this test can be used) for the duration of the COVID-19 declaration under Section 564(b)(1) of the Act, 21 U.S.C. section 360bbb-3(b)(1), unless the authorization is terminated or revoked.     Resp Syncytial Virus by PCR NEGATIVE NEGATIVE Final    Comment: (NOTE) Fact Sheet for Patients: BloggerCourse.com  Fact Sheet for Healthcare Providers: SeriousBroker.it  This test is not yet approved or cleared by the [place] FDA and has been authorized for detection and/or diagnosis of SARS-CoV-2 by FDA under an Emergency Use Authorization (EUA). This EUA will remain in effect (meaning this test can be used) for the duration of the COVID-19 declaration under Section 564(b)(1) of the Act, 21 U.S.C. section 360bbb-3(b)(1), unless the authorization is terminated or revoked.  Performed at [facility], [address] [place], [place] [address]   Culture, blood (routine x 2)     Status: None (Preliminary result)   Collection Time: 01/07/24 12:18 AM   Specimen: BLOOD LEFT ARM  Result Value Ref Range Status   Specimen Description BLOOD LEFT ARM  Final   Special Requests   Final    BOTTLES DRAWN AEROBIC AND ANAEROBIC Blood Culture adequate volume   Culture   Final    NO GROWTH 4 DAYS Performed at [facility], [address] [place], [place] [address]    Report Status PENDING  Incomplete  Culture, blood (routine x 2)     Status: None (Preliminary result)   Collection Time: 01/07/24 12:20 AM   Specimen: BLOOD  Result  Value Ref Range Status   Specimen Description BLOOD RIGHT ANTECUBITAL  Final   Special Requests   Final    BOTTLES DRAWN AEROBIC AND ANAEROBIC Blood Culture adequate volume   Culture  Setup Time   Final    GRAM NEGATIVE RODS GRAM POSITIVE COCCI AEROBIC BOTTLE ONLY CRITICAL RESULT CALLED TO, READ BACK BY AND VERIFIED WITH: PHARMD EMILY SINCLAIR ON 01/09/24 @ 1528 BY DRT    Culture   Final    GRAM NEGATIVE RODS GRAM POSITIVE COCCI IDENTIFICATION TO FOLLOW Performed at [facility], [address] [place], [place] [address]    Report Status PENDING  Incomplete  Blood Culture ID Panel (Reflexed)     Status: Abnormal   Collection Time: 01/07/24 12:20 AM  Result Value Ref Range Status   Enterococcus faecalis NOT DETECTED NOT DETECTED Final   Enterococcus Faecium NOT DETECTED NOT DETECTED Final   Listeria monocytogenes NOT DETECTED NOT DETECTED Final   Staphylococcus species DETECTED (A) NOT DETECTED Final    Comment: CRITICAL RESULT CALLED TO, READ BACK BY AND VERIFIED WITH: PHARMD EMILY SINCLAIR ON 01/09/24 @ 1528 BY DRT    Staphylococcus aureus (BCID) NOT DETECTED NOT DETECTED Final   Staphylococcus epidermidis NOT DETECTED NOT DETECTED Final   Staphylococcus lugdunensis NOT DETECTED NOT DETECTED Final   Streptococcus species NOT DETECTED NOT DETECTED Final   Streptococcus agalactiae NOT DETECTED NOT DETECTED Final   Streptococcus pneumoniae NOT DETECTED NOT DETECTED Final  Streptococcus pyogenes NOT DETECTED NOT DETECTED Final   A.calcoaceticus-baumannii NOT DETECTED NOT DETECTED Final   Bacteroides fragilis NOT DETECTED NOT DETECTED Final   Enterobacterales NOT DETECTED NOT DETECTED Final   Enterobacter cloacae complex NOT DETECTED NOT DETECTED Final   Escherichia coli NOT DETECTED NOT DETECTED Final   Klebsiella aerogenes NOT DETECTED NOT DETECTED Final   Klebsiella oxytoca NOT DETECTED NOT DETECTED Final   Klebsiella pneumoniae NOT DETECTED NOT DETECTED Final   Proteus  species NOT DETECTED NOT DETECTED Final   Salmonella species NOT DETECTED NOT DETECTED Final   Serratia marcescens NOT DETECTED NOT DETECTED Final   Haemophilus influenzae NOT DETECTED NOT DETECTED Final   Neisseria meningitidis NOT DETECTED NOT DETECTED Final   Pseudomonas aeruginosa NOT DETECTED NOT DETECTED Final   Stenotrophomonas maltophilia NOT DETECTED NOT DETECTED Final   Candida albicans NOT DETECTED NOT DETECTED Final   Candida auris NOT DETECTED NOT DETECTED Final   Candida glabrata NOT DETECTED NOT DETECTED Final   Candida krusei NOT DETECTED NOT DETECTED Final   Candida parapsilosis NOT DETECTED NOT DETECTED Final   Candida tropicalis NOT DETECTED NOT DETECTED Final   Cryptococcus neoformans/gattii NOT DETECTED NOT DETECTED Final    Comment: Performed at [facility], [address], [place], [place] [address]  Culture, blood (Routine X 2) w Reflex to ID Panel     Status: None (Preliminary result)   Collection Time: 01/09/24  9:50 PM   Specimen: BLOOD LEFT ARM  Result Value Ref Range Status   Specimen Description BLOOD LEFT ARM  Final   Special Requests   Final    BOTTLES DRAWN AEROBIC AND ANAEROBIC Blood Culture adequate volume   Culture   Final    NO GROWTH 2 DAYS Performed at [facility], [address], [place], [place] [address]    Report Status PENDING  Incomplete  Culture, blood (Routine X 2) w Reflex to ID Panel     Status: None (Preliminary result)   Collection Time: 01/09/24 10:02 PM   Specimen: BLOOD LEFT HAND  Result Value Ref Range Status   Specimen Description BLOOD LEFT HAND  Final   Special Requests   Final    BOTTLES DRAWN AEROBIC AND ANAEROBIC Blood Culture adequate volume   Culture   Final    NO GROWTH 2 DAYS Performed at [facility], [address] [place], [place] [address]    Report Status PENDING  Incomplete    RADIOLOGY STUDIES/RESULTS: No results found.    LOS: 5 days   Jeoffrey Massed, MD  [facility]    To contact the attending provider between 7A-7P or the covering provider during after hours 7P-7A, please log into the web site [email or web address] and access using universal [place] password for that web site. If you do not have the password, please call the hospital operator.  01/12/2024, 10:29 AM

## 2024-01-13 ENCOUNTER — Inpatient Hospital Stay (HOSPITAL_COMMUNITY)

## 2024-01-13 DIAGNOSIS — Z515 Encounter for palliative care: Secondary | ICD-10-CM | POA: Diagnosis not present

## 2024-01-13 DIAGNOSIS — Z7189 Other specified counseling: Secondary | ICD-10-CM | POA: Diagnosis not present

## 2024-01-13 DIAGNOSIS — G252 Other specified forms of tremor: Secondary | ICD-10-CM | POA: Diagnosis not present

## 2024-01-13 DIAGNOSIS — Z8679 Personal history of other diseases of the circulatory system: Secondary | ICD-10-CM | POA: Diagnosis not present

## 2024-01-13 DIAGNOSIS — J690 Pneumonitis due to inhalation of food and vomit: Secondary | ICD-10-CM

## 2024-01-13 DIAGNOSIS — E870 Hyperosmolality and hypernatremia: Secondary | ICD-10-CM | POA: Diagnosis not present

## 2024-01-13 DIAGNOSIS — A419 Sepsis, unspecified organism: Secondary | ICD-10-CM | POA: Diagnosis not present

## 2024-01-13 LAB — CBC
HCT: 28.3 % — ABNORMAL LOW (ref 39.0–52.0)
Hemoglobin: 8.1 g/dL — ABNORMAL LOW (ref 13.0–17.0)
MCH: 28.5 pg (ref 26.0–34.0)
MCHC: 28.6 g/dL — ABNORMAL LOW (ref 30.0–36.0)
MCV: 99.6 fL (ref 80.0–100.0)
Platelets: 178 10*3/uL (ref 150–400)
RBC: 2.84 MIL/uL — ABNORMAL LOW (ref 4.22–5.81)
RDW: 18 % — ABNORMAL HIGH (ref 11.5–15.5)
WBC: 13.4 10*3/uL — ABNORMAL HIGH (ref 4.0–10.5)
nRBC: 0.1 % (ref 0.0–0.2)

## 2024-01-13 LAB — GLUCOSE, CAPILLARY
Glucose-Capillary: 146 mg/dL — ABNORMAL HIGH (ref 70–99)
Glucose-Capillary: 147 mg/dL — ABNORMAL HIGH (ref 70–99)
Glucose-Capillary: 168 mg/dL — ABNORMAL HIGH (ref 70–99)
Glucose-Capillary: 171 mg/dL — ABNORMAL HIGH (ref 70–99)
Glucose-Capillary: 183 mg/dL — ABNORMAL HIGH (ref 70–99)
Glucose-Capillary: 292 mg/dL — ABNORMAL HIGH (ref 70–99)
Glucose-Capillary: 314 mg/dL — ABNORMAL HIGH (ref 70–99)

## 2024-01-13 LAB — BASIC METABOLIC PANEL WITH GFR
Anion gap: 13 (ref 5–15)
BUN: 40 mg/dL — ABNORMAL HIGH (ref 8–23)
CO2: 22 mmol/L (ref 22–32)
Calcium: 8.7 mg/dL — ABNORMAL LOW (ref 8.9–10.3)
Chloride: 106 mmol/L (ref 98–111)
Creatinine, Ser: 1.63 mg/dL — ABNORMAL HIGH (ref 0.61–1.24)
GFR, Estimated: 44 mL/min — ABNORMAL LOW (ref >60)
Glucose, Bld: 330 mg/dL — ABNORMAL HIGH (ref 70–99)
Potassium: 4.8 mmol/L (ref 3.5–5.1)
Sodium: 141 mmol/L (ref 135–145)

## 2024-01-13 NOTE — Inpatient Diabetes Management (Signed)
 Inpatient Diabetes Program Recommendations  AACE/ADA: New Consensus Statement on Inpatient Glycemic Control (2015)  Target Ranges:  Prepandial:   less than 140 mg/dL      Peak postprandial:   less than 180 mg/dL (1-2 hours)      Critically ill patients:  140 - 180 mg/dL   Lab Results  Component Value Date   GLUCAP 168 (H) 01/13/2024   HGBA1C 6.1 (H) 12/02/2023    Latest Reference Range & Units 01/12/24 07:59 01/12/24 12:06 01/12/24 16:30 01/12/24 20:32 01/12/24 23:30 01/13/24 04:33 01/13/24 08:35  Glucose-Capillary 70 - 99 mg/dL 147 (H) 829 (H) 562 (H) 210 (H) 283 (H) 314 (H) 168 (H)  (H): Data is abnormally high  Diabetes history: DM2 Outpatient Diabetes medications: Novolog 0-15 units Q4H, Basaglar 7 units at bedtime Current orders for Inpatient glycemic control: Semglee 12 units at bedtime, Novolog 0-15 units Q4H   Inpatient Diabetes Program Recommendations:   Please consider: -Add Novolog 2 units q 4 hrs. Tube feed coverage  Thank you, Billy Fischer. Amanada Philbrick, RN, MSN, CDCES  Diabetes Coordinator Inpatient Glycemic Control Team Team Pager [phone] (8am-5pm) 01/13/2024 11:26 AM

## 2024-01-13 NOTE — Progress Notes (Signed)
   01/13/24 0654  Oxygen Therapy  SpO2 (S)  97 %  O2 Device (S)  HFNC  O2 Flow Rate (L/min) (S)  5 L/min  Pulse Oximetry Type (S)  Continuous  Oximetry Probe Site Changed (S)  Yes   Attempt to wean, pt desats to 85 to 87% regular O2 with increased HR, otherwise pt remain tachypnea with decreased in HR and BP. Tol intermittent suction. SRP, RN

## 2024-01-13 NOTE — Progress Notes (Signed)
 Went back to room to check on patient-he is resting much more comfortably after getting IV morphine.  On 15 L of HFNC-tachypneic but comfortable.  Spouse not at bedside-will allow time for clinical outcomes and family discussion.  Palliative care also trying to get in touch with the family as well.

## 2024-01-13 NOTE — Progress Notes (Addendum)
 Pt unresponsive to pain or verbal responses, however, suctioned pt deep nasal tracheal due to pt biting down on suction catheter when entering mouth. Retrieved large amount of thick tan secretions which improved ventilation for a short time. Increased O2 3 times within 2hrs intervals and administered Morphine 1mg , pt currently  on  O2 8  l HFNC  SpO2 88-93%. Pt snoring respirations decreased and less congested. Pt appears more comfortable at present time on current setting. Will continue to monitor as pt is DNR/DNI and received report pt family is expecting to continue aggressive care. SRP,RN

## 2024-01-13 NOTE — Progress Notes (Signed)
   01/13/24 0400  Oxygen Therapy  SpO2 (S)  92 %  O2 Device (S)  HFNC  O2 Flow Rate (L/min) (S)  7 L/min  Pulse Oximetry Type Continuous  Oximetry Probe Site Changed Yes

## 2024-01-13 NOTE — Progress Notes (Signed)
 Palliative:  HPI: 73 y.o. male  with past medical history of recent ICH, HTN, recurrent mechanical falls, protein calorie malnutrition, DM2, CKD, prostate cancer (s/p radiation), hip fracture s/p surgical intervention 11/16/23, anxiety, and dementia admitted on 01/06/2024 from SNF with fever and tremors. Sepsis likely secondary to presumed aspiration pneumonia. Concern for ongoing aspiration and respiratory decline.   Discussed with Dr. Jerral Ralph. I met William Todd during his last admission. Dr. Jerral Ralph has had conversation with wife, William Todd, today regarding poor prognosis and concern he is approaching end of life. [name] is resting more comfortably after receiving morphine. I returned to bedside and met with wife and daughter. They express understanding of his condition and they have no questions or concerns at this time. They have my contact information and will call me as needed. Emotional support provided. Discussed with RN and Dr. Jerral Ralph.   Exam: Sleeping comfortably after medication. Did not awaken. Increased oxygen needs with respiratory decline.   Plan: - DNR/DNI in place - No changes to goals or plan of care at this time  30 min  Yong Channel, NP Palliative Medicine Team Pager [phone] (Please see [email or web address] for schedule) Team Phone [phone]

## 2024-01-13 NOTE — Progress Notes (Signed)
   01/13/24 0449  Oxygen Therapy  SpO2 (S)  96 %  O2 Device (S)  HFNC  O2 Flow Rate (L/min) (S)  5 L/min  Pulse Oximetry Type Continuous  Oximetry Probe Site Changed Yes

## 2024-01-13 NOTE — Progress Notes (Signed)
 Brief note: Sleeping comfortably  On 10 L of HFNC Spoke with daughter/spouse at bedside-all understand tenous state-potential for patent to pass suddenly. All questions answered, per spouse patient's son is scheduled to arrive tomorrow from out of town

## 2024-01-13 NOTE — Plan of Care (Signed)
   Problem: Education: Goal: Ability to describe self-care measures that may prevent or decrease complications (Diabetes Survival Skills Education) will improve Outcome: Progressing Goal: Individualized Educational Video(s) Outcome: Progressing   Problem: Coping: Goal: Ability to adjust to condition or change in health will improve Outcome: Progressing

## 2024-01-13 NOTE — Progress Notes (Signed)
 PROGRESS NOTE        PATIENT DETAILS Name: William Todd Age: 73 y.o. Sex: male Date of Birth: [birth year] Admit Date: 01/06/2024 Admitting Physician Dewayne Shorter Levora Dredge, MD ZOX:[id number], Modena Slater, MD  Brief Summary: Patient is a 73 y.o.  male with history of recent hospitalization for ICH (2/15 to 3/20)-discharged to SNF-presented with fever and tremors.  Significant events: 3/25>> admit to [facility]-fever/tremors. 3/28>> agitated-gurgling at times-tachypneic-given IV Ativan-with significant improvement-much more calmer.  CXR with basilar infiltrates bilaterally.Suspicion for aspiration with agitation.  Antibiotics broadened to cefepime. 3/29>> overnight O2 saturations dropped to 85-more left-sided infiltrates on repeat CXR.  Vancomycin started. 4/01>> worsening hypoxia-on 6 L of HFNC earlier this morning-worsened further-now on 100% NRB.  Significant studies: 3/25>> CT head: Decrease size/density of left frontal ICH-decreasing SAH 3/25>> CXR: No PNA. 3/28>> CXR: Bibasilar interstitial opacities. 3/29>> CXR: Left mid/lower lobe consolidation.  Significant microbiology data: 3/25>> COVID/influenza/RSV PCR: Negative 3/26>> blood culture: Gram-positive cocci (likely contaminant), gram-negative rod 3/28>> blood culture: No growth.  Procedures: None  Consults: Palliative care  Subjective: Had to be suctioned multiple times overnight-thick secretions obtained-on 6 L of HFNC when I saw him earlier-he was comfortable.  Later this morning-he developed tachypnea-and now is on 100% nonrebreather mask.  Objective: Vitals: Blood pressure 127/63, pulse 70, temperature 97.9 F (36.6 C), temperature source Axillary, resp. rate (!) 25, height 6' (1.829 m), weight 65.3 kg, SpO2 95%.   Exam: Gen Exam: Currently tachypneic-in some distress. HEENT:atraumatic, normocephalic Chest: Transmitted upper airway sounds CVS:S1S2 regular Abdomen:soft non tender, non  distended Extremities:no edema Neurology: Difficult exam but withdraws/moves all 4 extremities to pain.  No tremors this morning. Skin: no rash  Pertinent Labs/Radiology:    Latest Ref Rng & Units 01/13/2024    4:47 AM 01/12/2024    4:15 AM 01/11/2024    6:00 AM  CBC  WBC 4.0 - 10.5 K/uL 13.4  10.7  12.1   Hemoglobin 13.0 - 17.0 g/dL 8.1  7.0  7.3   Hematocrit 39.0 - 52.0 % 28.3  23.7  24.9   Platelets 150 - 400 K/uL 178  152  157     Lab Results  Component Value Date   NA 141 01/13/2024   K 4.8 01/13/2024   CL 106 01/13/2024   CO2 22 01/13/2024      Assessment/Plan: Sepsis likely secondary to presumed aspiration pneumonia-possible gram-negative bacteremia Unfortunately worsening-looks like he may have aspirated his secretions overnight-and again this morning-in spite of being n.p.o./upright/frequent suctioning/Robinul and strict aspiration precautions.  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  I explained that at this point he is rapidly worsening and is at risk of dying in the next few hours/days.  Explained what comfort measures/hospice care would look like at this point-since he is still tachypneic-I have requested bedside RN to give him as needed morphine/Ativan that was ordered several days ago.  Discussed with bedside RN-since patient is a DNR-actively dying-apart from frequent suctioning-getting assistance from respiratory therapist-really no further role in escalation in care (ICU care/rapid response etc.) I have reconsulted palliative care as well to continue to engage with family.  In the interim-continue with antibiotics and other care-but at this point he is  clearly deteriorating-actively aspirating and is heading the wrong way.  He has very close to end-of-life.  Note-Respiratory therapy  in the room when I was discussing care with spouse.  Acute hypoxic respiratory failure Worsening hypoxemia-see above-now on 100% NRB.  Acute metabolic encephalopathy Secondary to worsening hypoxemia/aspiration pneumonia-in the setting of recent large ICH  Supportive care.  Hypernatremia Improved with free water via PEG tube.  AKI CKD stage IIIa Creatinine worse today-suspect this is driven largely by ongoing aspiration pneumonitis. Continue PEG tube feeds/free water via PEG Avoid nephrotoxic agents.   Follow electrolytes.  Tremor Appears to be intentional-no tremors at rest Likely a physiologic response to febrile illness/recent hospitalization and worsens when he is in pain/discomfort and has fever. Suspect this is worsened when he has fever or is in pain/discomfort. Recently TSH on 2/15 stable. EEG-no seizures Neurology was curbsided by ED-recommendations were to switch metoprolol to propranolol. Need outpatient neurology evaluation if tremors continue after he recovers from his acute illness.  Normocytic anemia Secondary to acute illness Hb slowly downtrending-probably will require a PRBC transfusion if significant drop. Follow closely  Recent left frontal ICH/SAH with cerebral edema CT showing interval improvement Continue supportive care  Dysphagia-oropharyngeal Secondary to recent ICH Unfortunately continues to aspirate in spite of strict aspiration precaution/n.p.o. status See above discussion Continue tube feeds-keep NPO.  DM-2 CBGs fluctuating Continue Semglee 12 units Moderate scale SSI Not a candidate for aggressive glycemic control    Recent Labs    01/12/24 2330 01/13/24 0433 01/13/24 0835  GLUCAP 283* 314* 168*     HTN BP stable on midodrine Continuing propranolol-mostly for tremors. No longer on amlodipine  HLD Statin  Severe calorie malnutrition Tube feeds Nutrition evaluation appreciated.  Bilateral conjunctivitis Cipro eyedrops  for a few days.  History of prostate cancer Outpatient follow-up with urology/oncology.   Deep Tissue Pressure Injury L heel Unstageable PI L lateral foot  Stage 2 Pressure Injury L hip Unstageable PI sacrum  POA Wound care per wound care RN.  Palliative care DNR in place Difficult situation-recent ICH with severe oropharyngeal dysphagia-in spite of strict aspiration precautions-antibiotics-frequent suctioning/Robinul-appears that patient continues to aspirate and is rapidly worsening-has severe hypoxemia today-up to 15 L with NRB.  Please see prior notes-although patient has been very close to end-of-life-spouse has been resistant to initiation of comfort measures.   Discussed today again with spouse at bedside (respiratory therapist present in the room)-very clearly explained that he continues to deteriorate in spite of our best efforts-and that at this point he is very close to end-of-life and at risk of dying in a matter of few hours/days.  I have urged her to call other family members and her children.  I have reconsulted palliative care-she is okay with giving as needed morphine/Ativan.  She did indicate that she would want to talk with social worker regarding palliative care-but at this point I have explained that he probably would benefit from hospice care given that he is at end-of-life situation.  I had a frank discussion with her regarding what hospice care/comfort care would be like. Will give time for her to get in touch with other family members-and touch base in a few hours. Please note-several days back-I had offered to talk to other family members including her children-but she had politely declined-and did not want me calling other family members.   Code status:   Code Status: Limited: Do not attempt resuscitation (DNR) -DNR-LIMITED -Do Not Intubate/DNI    DVT Prophylaxis: enoxaparin (LOVENOX) injection  40 mg Start: 01/07/24 1000  Family Communication: Spouse at bedside on  4/1   Disposition Plan: Status is: Observation The patient will require care spanning > 2 midnights and should be moved to inpatient because: Severity of illness   Planned Discharge Destination:Skilled nursing facility   Diet: Diet Order     None         Antimicrobial agents: Anti-infectives (From admission, onward)    Start     Dose/Rate Route Frequency Ordered Stop   01/11/24 1330  vancomycin (VANCOREADY) IVPB 500 mg/100 mL        500 mg 100 mL/hr over 60 Minutes Intravenous  Once 01/11/24 1230 01/11/24 1512   01/11/24 0300  vancomycin (VANCOREADY) IVPB 750 mg/150 mL  Status:  Discontinued        750 mg 150 mL/hr over 60 Minutes Intravenous Every 24 hours 01/10/24 1158 01/12/24 1109   01/10/24 0300  vancomycin (VANCOCIN) IVPB 1000 mg/200 mL premix  Status:  Discontinued        1,000 mg 200 mL/hr over 60 Minutes Intravenous Every 24 hours 01/10/24 0155 01/10/24 1158   01/09/24 1700  ceFEPIme (MAXIPIME) 2 g in sodium chloride 0.9 % 100 mL IVPB        2 g 200 mL/hr over 30 Minutes Intravenous Every 12 hours 01/09/24 1608 01/16/24 2359   01/09/24 1000  vancomycin (VANCOCIN) IVPB 1000 mg/200 mL premix  Status:  Discontinued        1,000 mg 200 mL/hr over 60 Minutes Intravenous Every 48 hours 01/07/24 0140 01/07/24 0858   01/09/24 0900  ceFAZolin (ANCEF) IVPB 2g/100 mL premix       Note to Pharmacy: Give in IR for surgical prophylaxis   2 g 200 mL/hr over 30 Minutes Intravenous To Radiology 01/09/24 0803 01/09/24 1137   01/08/24 0500  vancomycin (VANCOCIN) IVPB 1000 mg/200 mL premix  Status:  Discontinued        1,000 mg 200 mL/hr over 60 Minutes Intravenous Every 24 hours 01/07/24 0858 01/07/24 1144   01/07/24 1300  Ampicillin-Sulbactam (UNASYN) 3 g in sodium chloride 0.9 % 100 mL IVPB  Status:  Discontinued        3 g 200 mL/hr over 30 Minutes Intravenous Every 6 hours 01/07/24 1212 01/09/24 1608   01/07/24 0230  metroNIDAZOLE (FLAGYL) IVPB 500 mg  Status:  Discontinued         500 mg 100 mL/hr over 60 Minutes Intravenous 2 times daily 01/07/24 0121 01/07/24 1154   01/07/24 0230  vancomycin (VANCOREADY) IVPB 1250 mg/250 mL        1,250 mg 166.7 mL/hr over 90 Minutes Intravenous  Once 01/07/24 0121 01/07/24 0638   01/07/24 0200  ceFEPIme (MAXIPIME) 2 g in sodium chloride 0.9 % 100 mL IVPB  Status:  Discontinued        2 g 200 mL/hr over 30 Minutes Intravenous 2 times daily 01/07/24 0121 01/07/24 1154        MEDICATIONS: Scheduled Meds:  acetylcysteine  3 mL Nebulization BID   amantadine  100 mg Per Tube BID   atorvastatin  40 mg Per Tube q1800   ciprofloxacin  1 drop Both Eyes Q4H while awake   enoxaparin (LOVENOX) injection  40 mg Subcutaneous Daily   famotidine  20 mg Per Tube BID   feeding supplement (PROSource TF20)  60 mL Per Tube Daily   fiber supplement (BANATROL TF)  60 mL Per Tube BID   free water  400 mL Per  Tube Q6H   Gerhardt's butt cream   Topical BID   glycopyrrolate  0.1 mg Intravenous TID   insulin aspart  0-15 Units Subcutaneous Q4H   insulin glargine-yfgn  12 Units Subcutaneous QHS   leptospermum manuka honey  1 Application Topical Daily   midodrine  10 mg Per Tube TID WC   multivitamin with minerals  1 tablet Per Tube Daily   polyethylene glycol  17 g Per Tube Daily   propranolol  40 mg Per Tube BID   senna-docusate  2 tablet Per Tube QHS   Continuous Infusions:  ceFEPime (MAXIPIME) IV 2 g (01/13/24 0442)   feeding supplement (OSMOLITE 1.5 CAL) 50 mL/hr at 01/12/24 0314   PRN Meds:.acetaminophen, bisacodyl, levalbuterol, LORazepam, metoprolol tartrate, morphine injection, ondansetron **OR** ondansetron (ZOFRAN) IV   I have personally reviewed following labs and imaging studies  LABORATORY DATA: CBC: Recent Labs  Lab 01/06/24 1512 01/07/24 0422 01/09/24 0612 01/10/24 0436 01/11/24 0600 01/12/24 0415 01/13/24 0447  WBC 13.5*   < > 12.6* 13.0* 12.1* 10.7* 13.4*  NEUTROABS 12.0*  --   --  11.6*  --   --   --    HGB 7.9*   < > 8.2* 7.2* 7.3* 7.0* 8.1*  HCT 26.4*   < > 26.9* 23.7* 24.9* 23.7* 28.3*  MCV 97.8   < > 98.5 96.7 99.6 99.2 99.6  PLT 239   < > 183 144* 157 152 178   < > = values in this interval not displayed.    Basic Metabolic Panel: Recent Labs  Lab 01/06/24 1512 01/07/24 0422 01/08/24 0748 01/09/24 0612 01/10/24 0436 01/11/24 0600 01/12/24 0415 01/13/24 0447  NA 147*   < > 150* 144 146* 143 141 141  K 4.2   < > 3.5 4.0 3.9 4.1 4.4 4.8  CL 106   < > 114* 112* 113* 111 109 106  CO2 28   < > 25 22 27 24  18* 22  GLUCOSE 207*   < > 103* 119* 171* 239* 339* 330*  BUN 34*   < > 18 24* 41* 41* 37* 40*  CREATININE 1.54*   < > 1.36* 1.35* 1.73* 1.36* 1.16 1.63*  CALCIUM 9.6   < > 9.5 8.1* 8.3* 8.4* 8.4* 8.7*  MG 2.0  --  1.8  --   --   --   --   --   PHOS  --   --  2.7  --   --   --   --   --    < > = values in this interval not displayed.    GFR: Estimated Creatinine Clearance: 37.3 mL/min (A) (by C-G formula based on SCr of 1.63 mg/dL (H)).  Liver Function Tests: Recent Labs  Lab 01/06/24 1512 01/07/24 0422 01/08/24 0748 01/11/24 0600  AST 44* 36 36 41  ALT 90* 69* 57* 33  ALKPHOS 143* 132* 139* 104  BILITOT 0.6 0.8 0.7 0.5  PROT 6.7 6.3* 6.6 5.3*  ALBUMIN 2.5* 2.3* 2.5* 1.9*   No results for input(s): "LIPASE", "AMYLASE" in the last 168 hours. Recent Labs  Lab 01/06/24 1512  AMMONIA 16    Coagulation Profile: No results for input(s): "INR", "PROTIME" in the last 168 hours.  Cardiac Enzymes: No results for input(s): "CKTOTAL", "CKMB", "CKMBINDEX", "TROPONINI" in the last 168 hours.  BNP (last 3 results) No results for input(s): "PROBNP" in the last 8760 hours.  Lipid Profile: No results for input(s): "CHOL", "HDL", "LDLCALC", "TRIG", "CHOLHDL", "  LDLDIRECT" in the last 72 hours.  Thyroid Function Tests: No results for input(s): "TSH", "T4TOTAL", "FREET4", "T3FREE", "THYROIDAB" in the last 72 hours.  Anemia Panel: No results for input(s):  "VITAMINB12", "FOLATE", "FERRITIN", "TIBC", "IRON", "RETICCTPCT" in the last 72 hours.  Urine analysis:    Component Value Date/Time   COLORURINE YELLOW 01/06/2024 2340   APPEARANCEUR HAZY (A) 01/06/2024 2340   LABSPEC 1.010 01/06/2024 2340   PHURINE 5.0 01/06/2024 2340   GLUCOSEU >=500 (A) 01/06/2024 2340   HGBUR SMALL (A) 01/06/2024 2340   BILIRUBINUR NEGATIVE 01/06/2024 2340   KETONESUR NEGATIVE 01/06/2024 2340   PROTEINUR 30 (A) 01/06/2024 2340   NITRITE NEGATIVE 01/06/2024 2340   LEUKOCYTESUR NEGATIVE 01/06/2024 2340    Sepsis Labs: Lactic Acid, Venous    Component Value Date/Time   LATICACIDVEN 2.5 (HH) 01/07/2024 0025    MICROBIOLOGY: Recent Results (from the past 240 hours)  Resp panel by RT-PCR (RSV, Flu A&B, Covid) Anterior Nasal Swab     Status: None   Collection Time: 01/06/24  3:07 PM   Specimen: Anterior Nasal Swab  Result Value Ref Range Status   SARS Coronavirus 2 by RT PCR NEGATIVE NEGATIVE Final   Influenza A by PCR NEGATIVE NEGATIVE Final   Influenza B by PCR NEGATIVE NEGATIVE Final    Comment: (NOTE) The Xpert Xpress SARS-CoV-2/FLU/RSV plus assay is intended as an aid in the diagnosis of influenza from Nasopharyngeal swab specimens and should not be used as a sole basis for treatment. Nasal washings and aspirates are unacceptable for Xpert Xpress SARS-CoV-2/FLU/RSV testing.  Fact Sheet for Patients: BloggerCourse.com  Fact Sheet for Healthcare Providers: SeriousBroker.it  This test is not yet approved or cleared by the [place] FDA and has been authorized for detection and/or diagnosis of SARS-CoV-2 by FDA under an Emergency Use Authorization (EUA). This EUA will remain in effect (meaning this test can be used) for the duration of the COVID-19 declaration under Section 564(b)(1) of the Act, 21 U.S.C. section 360bbb-3(b)(1), unless the authorization is terminated or revoked.     Resp  Syncytial Virus by PCR NEGATIVE NEGATIVE Final    Comment: (NOTE) Fact Sheet for Patients: BloggerCourse.com  Fact Sheet for Healthcare Providers: SeriousBroker.it  This test is not yet approved or cleared by the [place] FDA and has been authorized for detection and/or diagnosis of SARS-CoV-2 by FDA under an Emergency Use Authorization (EUA). This EUA will remain in effect (meaning this test can be used) for the duration of the COVID-19 declaration under Section 564(b)(1) of the Act, 21 U.S.C. section 360bbb-3(b)(1), unless the authorization is terminated or revoked.  Performed at [facility], [address] [place], [place] [address]   Culture, blood (routine x 2)     Status: None   Collection Time: 01/07/24 12:18 AM   Specimen: BLOOD LEFT ARM  Result Value Ref Range Status   Specimen Description BLOOD LEFT ARM  Final   Special Requests   Final    BOTTLES DRAWN AEROBIC AND ANAEROBIC Blood Culture adequate volume   Culture   Final    NO GROWTH 5 DAYS Performed at [facility], [address] [place], [place] [address]    Report Status 01/12/2024 FINAL  Final  Culture, blood (routine x 2)     Status: None (Preliminary result)   Collection Time: 01/07/24 12:20 AM   Specimen: BLOOD  Result Value Ref Range Status   Specimen Description BLOOD RIGHT ANTECUBITAL  Final   Special Requests  Final    BOTTLES DRAWN AEROBIC AND ANAEROBIC Blood Culture adequate volume   Culture  Setup Time   Final    GRAM NEGATIVE RODS GRAM POSITIVE COCCI AEROBIC BOTTLE ONLY CRITICAL RESULT CALLED TO, READ BACK BY AND VERIFIED WITH: PHARMD EMILY SINCLAIR ON 01/09/24 @ 1528 BY DRT Organism ID to follow    Culture   Final    CULTURE REINCUBATED FOR BETTER GROWTH Performed at [facility], [address] [place], [place] [address]    Report Status PENDING  Incomplete  Blood Culture ID Panel (Reflexed)     Status: Abnormal    Collection Time: 01/07/24 12:20 AM  Result Value Ref Range Status   Enterococcus faecalis NOT DETECTED NOT DETECTED Final   Enterococcus Faecium NOT DETECTED NOT DETECTED Final   Listeria monocytogenes NOT DETECTED NOT DETECTED Final   Staphylococcus species DETECTED (A) NOT DETECTED Final    Comment: CRITICAL RESULT CALLED TO, READ BACK BY AND VERIFIED WITH: PHARMD EMILY SINCLAIR ON 01/09/24 @ 1528 BY DRT    Staphylococcus aureus (BCID) NOT DETECTED NOT DETECTED Final   Staphylococcus epidermidis NOT DETECTED NOT DETECTED Final   Staphylococcus lugdunensis NOT DETECTED NOT DETECTED Final   Streptococcus species NOT DETECTED NOT DETECTED Final   Streptococcus agalactiae NOT DETECTED NOT DETECTED Final   Streptococcus pneumoniae NOT DETECTED NOT DETECTED Final   Streptococcus pyogenes NOT DETECTED NOT DETECTED Final   A.calcoaceticus-baumannii NOT DETECTED NOT DETECTED Final   Bacteroides fragilis NOT DETECTED NOT DETECTED Final   Enterobacterales NOT DETECTED NOT DETECTED Final   Enterobacter cloacae complex NOT DETECTED NOT DETECTED Final   Escherichia coli NOT DETECTED NOT DETECTED Final   Klebsiella aerogenes NOT DETECTED NOT DETECTED Final   Klebsiella oxytoca NOT DETECTED NOT DETECTED Final   Klebsiella pneumoniae NOT DETECTED NOT DETECTED Final   Proteus species NOT DETECTED NOT DETECTED Final   Salmonella species NOT DETECTED NOT DETECTED Final   Serratia marcescens NOT DETECTED NOT DETECTED Final   Haemophilus influenzae NOT DETECTED NOT DETECTED Final   Neisseria meningitidis NOT DETECTED NOT DETECTED Final   Pseudomonas aeruginosa NOT DETECTED NOT DETECTED Final   Stenotrophomonas maltophilia NOT DETECTED NOT DETECTED Final   Candida albicans NOT DETECTED NOT DETECTED Final   Candida auris NOT DETECTED NOT DETECTED Final   Candida glabrata NOT DETECTED NOT DETECTED Final   Candida krusei NOT DETECTED NOT DETECTED Final   Candida parapsilosis NOT DETECTED NOT DETECTED  Final   Candida tropicalis NOT DETECTED NOT DETECTED Final   Cryptococcus neoformans/gattii NOT DETECTED NOT DETECTED Final    Comment: Performed at [facility], [address], [place], [place] [address]  Culture, blood (Routine X 2) w Reflex to ID Panel     Status: None (Preliminary result)   Collection Time: 01/09/24  9:50 PM   Specimen: BLOOD LEFT ARM  Result Value Ref Range Status   Specimen Description BLOOD LEFT ARM  Final   Special Requests   Final    BOTTLES DRAWN AEROBIC AND ANAEROBIC Blood Culture adequate volume   Culture   Final    NO GROWTH 4 DAYS Performed at [facility], [address] [place], [place] [address]    Report Status PENDING  Incomplete  Culture, blood (Routine X 2) w Reflex to ID Panel     Status: None (Preliminary result)   Collection Time: 01/09/24 10:02 PM   Specimen: BLOOD LEFT HAND  Result Value Ref Range Status   Specimen  Description BLOOD LEFT HAND  Final   Special Requests   Final    BOTTLES DRAWN AEROBIC AND ANAEROBIC Blood Culture adequate volume   Culture   Final    NO GROWTH 4 DAYS Performed at [facility], [address] [place], [place] [address]    Report Status PENDING  Incomplete    RADIOLOGY STUDIES/RESULTS: DG Chest Port 1V same Day Result Date: 01/13/2024 CLINICAL DATA:  Short of breath EXAM: PORTABLE CHEST 1 VIEW COMPARISON:  01/12/2024 FINDINGS: Low lung volumes. Increase in RIGHT basilar atelectasis. Unchanged LEFT pleural effusion. Upper lungs are clear. No pneumothorax. Gas-filled colon. IMPRESSION: 1. Low lung volumes with increased RIGHT basilar atelectasis. 2. Unchanged LEFT pleural effusion. Electronically Signed   By: Genevive Bi M.D.   On: 01/13/2024 10:30   DG Chest Port 1 View Result Date: 01/12/2024 CLINICAL DATA:  Shortness of breath EXAM: PORTABLE CHEST 1 VIEW COMPARISON:  01/10/2024 FINDINGS: Persistent left lower lobe consolidation with pleural effusion. No pneumothorax. Right lung is  grossly clear. Stable cardiopericardial silhouette overlapping cardiac leads. Presumed chondroid lesion or ossific lesion involving the proximal right humeral metaphysis. Please correlate with any known history or symptoms. IMPRESSION: Stable left-sided lung infiltrate and effusion. Recommend continued follow-up. Sclerotic lesion involving the proximal right humerus. Possible chondroid lesion. Please correlate for any known history or dedicated evaluation when appropriate Electronically Signed   By: Karen Kays M.D.   On: 01/12/2024 10:45      LOS: 6 days   Jeoffrey Massed, MD  [facility]    To contact the attending provider between 7A-7P or the covering provider during after hours 7P-7A, please log into the web site [email or web address] and access using universal [place] password for that web site. If you do not have the password, please call the hospital operator.  01/13/2024, 11:05 AM

## 2024-01-13 DEATH — deceased

## 2024-01-14 ENCOUNTER — Inpatient Hospital Stay (HOSPITAL_COMMUNITY)

## 2024-01-14 DIAGNOSIS — A419 Sepsis, unspecified organism: Secondary | ICD-10-CM | POA: Diagnosis not present

## 2024-01-14 DIAGNOSIS — R6510 Systemic inflammatory response syndrome (SIRS) of non-infectious origin without acute organ dysfunction: Secondary | ICD-10-CM

## 2024-01-14 DIAGNOSIS — G252 Other specified forms of tremor: Secondary | ICD-10-CM | POA: Diagnosis not present

## 2024-01-14 DIAGNOSIS — Z8679 Personal history of other diseases of the circulatory system: Secondary | ICD-10-CM | POA: Diagnosis not present

## 2024-01-14 DIAGNOSIS — E870 Hyperosmolality and hypernatremia: Secondary | ICD-10-CM | POA: Diagnosis not present

## 2024-01-14 LAB — PROCALCITONIN: Procalcitonin: 4.69 ng/mL

## 2024-01-14 LAB — COMPREHENSIVE METABOLIC PANEL WITH GFR
ALT: 51 U/L — ABNORMAL HIGH (ref 0–44)
AST: 64 U/L — ABNORMAL HIGH (ref 15–41)
Albumin: 1.8 g/dL — ABNORMAL LOW (ref 3.5–5.0)
Alkaline Phosphatase: 117 U/L (ref 38–126)
Anion gap: 5 (ref 5–15)
BUN: 52 mg/dL — ABNORMAL HIGH (ref 8–23)
CO2: 26 mmol/L (ref 22–32)
Calcium: 7.8 mg/dL — ABNORMAL LOW (ref 8.9–10.3)
Chloride: 108 mmol/L (ref 98–111)
Creatinine, Ser: 1.66 mg/dL — ABNORMAL HIGH (ref 0.61–1.24)
GFR, Estimated: 43 mL/min — ABNORMAL LOW (ref >60)
Glucose, Bld: 274 mg/dL — ABNORMAL HIGH (ref 70–99)
Potassium: 5.6 mmol/L — ABNORMAL HIGH (ref 3.5–5.1)
Sodium: 139 mmol/L (ref 135–145)
Total Bilirubin: 0.7 mg/dL (ref 0.0–1.2)
Total Protein: 5.6 g/dL — ABNORMAL LOW (ref 6.5–8.1)

## 2024-01-14 LAB — CBC
HCT: 26.3 % — ABNORMAL LOW (ref 39.0–52.0)
Hemoglobin: 7.8 g/dL — ABNORMAL LOW (ref 13.0–17.0)
MCH: 30.1 pg (ref 26.0–34.0)
MCHC: 29.7 g/dL — ABNORMAL LOW (ref 30.0–36.0)
MCV: 101.5 fL — ABNORMAL HIGH (ref 80.0–100.0)
Platelets: 158 10*3/uL (ref 150–400)
RBC: 2.59 MIL/uL — ABNORMAL LOW (ref 4.22–5.81)
RDW: 17.9 % — ABNORMAL HIGH (ref 11.5–15.5)
WBC: 17.6 10*3/uL — ABNORMAL HIGH (ref 4.0–10.5)
nRBC: 0.1 % (ref 0.0–0.2)

## 2024-01-14 LAB — GLUCOSE, CAPILLARY
Glucose-Capillary: 141 mg/dL — ABNORMAL HIGH (ref 70–99)
Glucose-Capillary: 254 mg/dL — ABNORMAL HIGH (ref 70–99)
Glucose-Capillary: 248 mg/dL — ABNORMAL HIGH (ref 70–99)
Glucose-Capillary: 144 mg/dL — ABNORMAL HIGH (ref 70–99)
Glucose-Capillary: 140 mg/dL — ABNORMAL HIGH (ref 70–99)

## 2024-01-14 LAB — CULTURE, BLOOD (ROUTINE X 2)
Culture: NO GROWTH
Culture: NO GROWTH
Special Requests: ADEQUATE
Special Requests: ADEQUATE
Special Requests: ADEQUATE

## 2024-01-14 LAB — CREATININE, SERUM
Creatinine, Ser: 1.63 mg/dL — ABNORMAL HIGH (ref 0.61–1.24)
GFR, Estimated: 44 mL/min — ABNORMAL LOW (ref >60)

## 2024-01-14 MED ORDER — GLYCOPYRROLATE 0.2 MG/ML IJ SOLN: 0.2000 mg | Freq: Three times a day (TID) | INTRAMUSCULAR | Status: DC

## 2024-02-03 ENCOUNTER — Inpatient Hospital Stay: Admitting: Adult Health

## 2024-02-12 NOTE — Progress Notes (Signed)
    1200  Assess: MEWS Score  BP (!) 90/41  MAP (mmHg) (!) 55  Pulse Rate 81  ECG Heart Rate 82  Resp (!) 32  Level of Consciousness Unresponsive  SpO2 (!) 86 %  O2 Device Non-rebreather Mask  O2 Flow Rate (L/min) 15 L/min  Assess: MEWS Score  MEWS Temp 0  MEWS Systolic 1  MEWS Pulse 0  MEWS RR 2  MEWS LOC 3  MEWS Score 6  MEWS Score Color Red  Assess: if the MEWS score is Yellow or Red  Were vital signs accurate and taken at a resting state? Yes  Does the patient meet 2 or more of the SIRS criteria? Yes  Does the patient have a confirmed or suspected source of infection? Yes  MEWS guidelines implemented  No, previously red, continue vital signs every 4 hours  Notify: Charge Nurse/RN  Name of Charge Nurse/RN Notified Animator  Provider Notification  Provider Name/Title Dr. Jerral Ralph  Date Provider Notified   Time Provider Notified 1241  Method of Notification Page (secure chat)  Assess: SIRS CRITERIA  SIRS Temperature  0  SIRS Respirations  1  SIRS Pulse 0  SIRS WBC 1  SIRS Score Sum  2

## 2024-02-12 NOTE — Death Summary Note (Signed)
 DEATH SUMMARY   Patient Details  Name: MATTHEO SWINDLE MRN: [id number] DOB: 1951-06-30 OZH:[id number], Modena Slater, MD Admission/Discharge Information   Admit Date:  2024/01/26  Date of Death: Date of Death: 02/03/24  Time of Death: Time of Death: 02/07/11  Length of Stay: 7   Principle Cause of death: Aspiration Pneumonia  Hospital Diagnoses: Principal Problem:   Sepsis (HCC) Active Problems:   Febrile illness   Hypernatremia   Coarse tremors   History of intracranial hemorrhage and subarachnoid hemorrhage 11/29/23   Decubitus ulcer   Acute conjunctivitis, bilateral   S/P percutaneous endoscopic gastrostomy (PEG) tube placement 12/23/2023(HCC)   Hypertension   History of prostate cancer   Non-insulin dependent type 2 diabetes mellitus (HCC)   Protein-calorie malnutrition, severe   Gastrostomy tube dependent (HCC)   Aspiration pneumonia (HCC)   Goals of care, counseling/discussion   Palliative care by specialist   Hospital Course: Patient is a 73 y.o.  male with history of recent hospitalization for ICH (2/15 to 3/20)-discharged to SNF-presented with fever and tremors.   Significant events: 3/25>> admit to [facility]-fever/tremors. 3/28>> agitated-gurgling at times-tachypneic-given IV Ativan-with significant improvement-much more calmer.  CXR with basilar infiltrates bilaterally.Suspicion for aspiration with agitation.  Antibiotics broadened to cefepime. 3/29>> overnight O2 saturations dropped to 85-more left-sided infiltrates on repeat CXR.  Vancomycin started. 4/01>> worsening hypoxia-on 6 L of HFNC earlier this morning-worsened further-now on 100% NRB. 4/2>>More lethargic-apneic spells-on 100% NRB   Significant studies: 3/25>> CT head: Decrease size/density of left frontal ICH-decreasing SAH 3/25>> CXR: No PNA. 3/28>> CXR: Bibasilar interstitial opacities. 3/29>> CXR: Left mid/lower lobe consolidation 4/01>> CXR: Interval worsening of lung aeration-right greater than left  infiltrates.   Significant microbiology data: 3/25>> COVID/influenza/RSV PCR: Negative 3/26>> blood culture: Coagulase-negative staph (prior report of gram-negative rod-not seen) 3/28>> blood culture: No growth.     Procedures: None   Consults: Palliative care   Assessment and Plan: Sepsis secondary to aspiration pneumonia Acute hypoxic respiratory failure secondary to aspiration pneumonia Unfortunately hospital course complicated by gradually worsening hypoxemia in spite of n.p.o. status/strict aspiration precautions/Robinul and empiric antimicrobial therapy.  Numerous discussions was held with spouse by this MD and palliative care.  Although patient remained a DNR-family was resistant to efforts to transition to comfort measures.  He unfortunately continued to have aspiration events and required NRB.  On 4/2-he was significantly tachypneic-barely responsive-family was called to bedside-Long discussion regarding comfort care-however patient passed away shortly after.  Please see prior notes.  He did receive several doses of morphine and Ativan when he started developing air hunger tachypnea.   Acute hypoxic respiratory failure Gradually developed worsening hypoxemia-requiring 100% nonrebreather mask.  This was clearly secondary to aspiration pneumonia.  He had days when he was accumulating secretions and was "gurgling"    Acute metabolic encephalopathy Secondary to worsening hypoxemia/aspiration pneumonia-in the setting of recent large ICH  Supportive care.   Hypernatremia Improved with free water via PEG tube.   Hyperkalemia Secondary to worsening renal function Received Lokelma   AKI CKD stage IIIa AKI was felt to be hemodynamically mediated due to ongoing aspiration pneumonitis He was managed with supportive care-tube feeds/free water was continued.     Tremor Appears to be intentional-no tremors at rest Likely a physiologic response to febrile illness/recent  hospitalization and worsens when he is in pain/discomfort and has fever. Suspect this  worsenD he has fever or is in pain/discomfort. Recently TSH on 2/15 stable. EEG-no seizures Neurology was curbsided by ED-recommendations were to switch  metoprolol to propranolol. Need outpatient neurology evaluation if tremors continue after he recovers from his acute illness.   Normocytic anemia Secondary to acute illness Hb did slowly downtrend but he never required a blood transfusion.   Recent left frontal ICH/SAH with cerebral edema CT showing interval improvement Continue supportive care   Dysphagia-oropharyngeal Secondary to recent ICH Unfortunately continues to aspirate in spite of strict aspiration precaution/n.p.o. status See above discussion Was on tube feeds-kept NPO.   DM-2 CBGs fluctuating Was managed with Semglee and SSI. He was not felt to be candidate for aggressive glycemic control.  HTN BP was stable-but he was on midodrine He was continued on propranolol mostly for tremors.     HLD He was maintained on statin   Severe calorie malnutrition Tube feeds   Bilateral conjunctivitis Cipro eyedrops was given for a few days.   History of prostate cancer Outpatient follow-up with urology/oncology.   Deep Tissue Pressure Injury L heel Unstageable PI L lateral foot  Stage 2 Pressure Injury L hip Unstageable PI sacrum  POA Wound care per wound care RN.   Palliative care Please see prior notes Very difficult situation-DNR in place but patient was overtly aspirating and started deteriorating-requiring 100% nonrebreather mask.  After extensive discussion with spouse-he was given Ativan and morphine For comfort.  But spouse was resistant to transitioning him to full comfort measures.  Unfortunately he continued to decline-and on 4/2-was barely responsive-overtly aspirating and profoundly hypoxemic and in distress.  He was given several doses of morphine/Ativan.  He  subsequently passed away at 1412 hrs.    The results of significant diagnostics from this hospitalization (including imaging, microbiology, ancillary and laboratory) are listed below for reference.   Significant Diagnostic Studies: DG Chest Port 1V same Day Result Date:  CLINICAL DATA:  Short of breath. EXAM: PORTABLE CHEST 1 VIEW COMPARISON:  01/13/2024 and older studies. FINDINGS: Cardiac silhouette is normal in size. No mediastinal masses. Perihilar opacity, right greater than left, with more diffuse interstitial thickening. Opacity at the left lung base obscures the left hemidiaphragm consistent with layering pleural fluid. No pneumothorax. IMPRESSION: 1. Interval worsening of lung aeration. Right greater than left perihilar lung opacities associated more diffuse interstitial thickening. IR insert with a pulmonary edema versus multifocal infection. Persistent left pleural effusion. Electronically Signed   By: Amie Portland M.D.   On:  07:33   DG Chest Port 1V same Day Result Date: 01/13/2024 CLINICAL DATA:  Short of breath EXAM: PORTABLE CHEST 1 VIEW COMPARISON:  01/12/2024 FINDINGS: Low lung volumes. Increase in RIGHT basilar atelectasis. Unchanged LEFT pleural effusion. Upper lungs are clear. No pneumothorax. Gas-filled colon. IMPRESSION: 1. Low lung volumes with increased RIGHT basilar atelectasis. 2. Unchanged LEFT pleural effusion. Electronically Signed   By: Genevive Bi M.D.   On: 01/13/2024 10:30   DG Chest Port 1 View Result Date: 01/12/2024 CLINICAL DATA:  Shortness of breath EXAM: PORTABLE CHEST 1 VIEW COMPARISON:  01/10/2024 FINDINGS: Persistent left lower lobe consolidation with pleural effusion. No pneumothorax. Right lung is grossly clear. Stable cardiopericardial silhouette overlapping cardiac leads. Presumed chondroid lesion or ossific lesion involving the proximal right humeral metaphysis. Please correlate with any known history or symptoms. IMPRESSION: Stable  left-sided lung infiltrate and effusion. Recommend continued follow-up. Sclerotic lesion involving the proximal right humerus. Possible chondroid lesion. Please correlate for any known history or dedicated evaluation when appropriate Electronically Signed   By: Karen Kays M.D.   On: 01/12/2024 10:45   DG Chest Port 1  View Result Date: 01/10/2024 CLINICAL DATA:  Shortness of breath. EXAM: PORTABLE CHEST 1 VIEW COMPARISON:  Chest x-ray from same day at 0114 hours. FINDINGS: The patient is rotated to the left. Stable cardiomediastinal silhouette with normal heart size. Unchanged consolidation throughout the left lower lobe with obscuration of the left hemidiaphragm. The left heart border remains visible. Probable small left pleural effusion. Clear right lung. No pneumothorax. No acute osseous abnormality. IMPRESSION: 1. Unchanged left lower lobe pneumonia. Electronically Signed   By: Obie Dredge M.D.   On: 01/10/2024 11:15   DG CHEST PORT 1 VIEW Result Date: 01/10/2024 CLINICAL DATA:  Dyspnea EXAM: PORTABLE CHEST 1 VIEW COMPARISON:  01/09/2024, 01/08/2024 FINDINGS: There is developing consolidation within the left mid and lower lung zone. Small cyst at left pleural effusion suspected. No pneumothorax. Right lung is clear. Cardiac size within limits. Pulmonary vascularity is normal. No acute. IMPRESSION: 1. Developing left mid and lower lung zone consolidation, likely pneumonia. Small left pleural effusion. Electronically Signed   By: Helyn Numbers M.D.   On: 01/10/2024 01:33   DG Chest Port 1V same Day Result Date: 01/09/2024 CLINICAL DATA:  [id number] SOB (shortness of breath) [id number] EXAM: PORTABLE CHEST - 1 VIEW SAME DAY COMPARISON:  01/08/2024 FINDINGS: Relatively low lung volumes with some mild left perihilar and basilar interstitial opacities. Right lung clear. Heart size and mediastinal contours are within normal limits. Aortic Atherosclerosis (ICD10-170.0). Blunting of left lateral costophrenic  angle. IMPRESSION: Low volumes with mild left perihilar and basilar interstitial opacities. Electronically Signed   By: Corlis Leak M.D.   On: 01/09/2024 10:00   DG Chest Port 1 View Result Date: 01/08/2024 CLINICAL DATA:  Shortness of breath. EXAM: PORTABLE CHEST 1 VIEW COMPARISON:  Chest radiograph dated 01/06/2024. FINDINGS: No focal consolidation, pleural effusion, or pneumothorax. The cardiac silhouette is within normal limits. No acute osseous pathology. IMPRESSION: No active disease. Electronically Signed   By: Elgie Collard M.D.   On: 01/08/2024 11:17   EEG adult Result Date: 01/08/2024 Charlsie Quest, MD     01/08/2024  8:24 AM Patient Name: TALIN FEISTER MRN: [id number] Epilepsy Attending: Charlsie Quest Referring Physician/Provider: Maretta Bees, MD Date: 01/07/2024 Duration: 22.47 mins Patient history: 73yo M with left frontal ICH getting eeg to evaluate for seizure Level of alertness: Awake AEDs during EEG study: None Technical aspects: This EEG study was done with scalp electrodes positioned according to the 10-20 International system of electrode placement. Electrical activity was reviewed with band pass filter of 1-70Hz , sensitivity of 7 uV/mm, display speed of 40mm/sec with a 60Hz  notched filter applied as appropriate. EEG data were recorded continuously and digitally stored.  Video monitoring was available and reviewed as appropriate. Description: The posterior dominant rhythm consists of 8 Hz activity of moderate voltage (25-35 uV) seen predominantly in posterior head regions, symmetric and reactive to eye opening and eye closing. EEG showed intermittent generalized 5 to 6 Hz theta slowing. Hyperventilation and photic stimulation were not performed.   ABNORMALITY - Intermittent slow, generalized IMPRESSION: This study is suggestive of mild diffuse encephalopathy. No seizures or epileptiform discharges were seen throughout the recording. Charlsie Quest   CT Head Wo  Contrast Result Date: 01/06/2024 CLINICAL DATA:  Tremors EXAM: CT HEAD WITHOUT CONTRAST TECHNIQUE: Contiguous axial images were obtained from the base of the skull through the vertex without intravenous contrast. RADIATION DOSE REDUCTION: This exam was performed according to the departmental dose-optimization program which includes automated exposure control, adjustment of  the mA and/or kV according to patient size and/or use of iterative reconstruction technique. COMPARISON:  CT brain 12/12/2023 FINDINGS: Brain: Motion degradation on multiple images limits the exam. Decreasing size of left frontal intraparenchymal hematoma, also appears more hypodense in the interim consistent with interval evolutionary changes. Resolution of intraventricular blood compared to prior. Scattered subarachnoid hemorrhage not progressive and appears decreased in the interim as well. Residual 3 mm midline shift to the right, decreased from 7 mm previously. Decreased edema surrounding left frontal hematoma. Chronic right occipital infarct. Residual mass effect on left frontal horn but also diminished. Vascular: No hyperdense vessels.  Carotid vascular calcifications Skull: Normal. Negative for fracture or focal lesion. Incompletely visualized bony mass off the right mandible. Sinuses/Orbits: No acute finding. Other: None IMPRESSION: 1. Motion degradation on multiple images limits the exam. Interval decreased size and density of left frontal intraparenchymal hematoma with decreased edema and decreased midline shift to the right compared to prior. Decreasing subarachnoid hemorrhage and interval resolution of intraventricular blood. 2. Chronic right occipital infarct Electronically Signed   By: Jasmine Pang M.D.   On: 01/06/2024 18:45   DG Chest Port 1 View Result Date: 01/06/2024 CLINICAL DATA:  Weakness and tremors. EXAM: PORTABLE CHEST 1 VIEW COMPARISON:  AP chest 12/24/2023 FINDINGS: Cardiac silhouette and mediastinal contours are  within normal limits. Mild atherosclerotic calcifications within the aortic arch. The lungs are clear. No pleural effusion or pneumothorax. No acute skeletal abnormality.Dense stool is seen within the partially visualized colon. IMPRESSION: 1.  No acute cardiopulmonary disease process. 2. Dense stool within the partially visualized colon. Electronically Signed   By: Neita Garnet M.D.   On: 01/06/2024 17:59   DG Swallowing Func-Speech Pathology Result Date: 12/25/2023 Table formatting from the original result was not included. Modified Barium Swallow Study Patient Details Name: DARALD UZZLE MRN: [id number] Date of Birth: Nov 01, 1950 Today's Date: 12/25/2023 HPI/PMH: HPI: Braycen Burandt is a 73 yo male admitted [facility] 11/29/23 after a fall. PMH: HTN, recurrent falls, malnutrition, DM2, CKD, prostate cancer (2014). Acute left frontal lobe hematoma with multiple bilateral small SAH. CXR concerning for aspiration PNA. PEG placed 3/11 Clinical Impression: Pt scored a DIGEST rating of 2, moderate pharyngeal dysphagia. Oral dysphagia was also present.  He demonstrated fluctuating swallowing, hampered by his cognitive impairments, including poor initiation and difficulty with sustaining his attention during the study. There was frequent oral holding and reduced oral movement/propulsion into the pharynx.  There was inconsistent base-of-tongue and pharyngeal wall contraction, leading to variations in degree of residue.  There was diminished laryngeal mobility, inconsistent epiglottic closure, and intermittent penetration of materials into the laryngeal vestibule.  Mixed consistencies collected in the pyriforms and on two occasions spilled into the airway with no response from the patient.  Pt could not follow commands for compensatory strategies or a cued cough.  There was no single consistency that can be deemed safer than the others. However, with opportunities to swallow, pt may show some gains.  For now,  continue ice chips and allow teaspoons of water with supervision.  SLP will follow while in acute care; he will benefit from swallowing tx at next level of care.  DIGEST Swallow Severity Rating*             Safety: 2             Efficiency:2             Overall Pharyngeal Swallow Severity: 2 moderate 1: mild; 2: moderate; 3:  severe; 4: profound *The Dynamic Imaging Grade of Swallowing Toxicity is standardized for the head and neck cancer population, however, demonstrates promising clinical applications across populations to standardize the clinical rating of pharyngeal swallow safety and severity.    Factors that may increase risk of adverse event in presence of aspiration [name] & [name] 2021): Factors that may increase risk of adverse event in presence of aspiration [name] & [name] 2021): Reduced cognitive function; Limited mobility; Dependence for feeding and/or oral hygiene; Weak cough; Aspiration of thick, dense, and/or acidic materials Recommendations/Plan: Swallowing Evaluation Recommendations Swallowing Evaluation Recommendations Recommendations: NPO; Ice chips PRN after oral care (teaspoons of water after oral care) Liquid Administration via: Spoon Medication Administration: Via alternative means Supervision: Full assist for feeding Swallowing strategies  : Small bites/sips Oral care recommendations: Oral care QID (4x/day) Caregiver Recommendations: Have oral suction available Treatment Plan Treatment Plan Treatment recommendations: Therapy as outlined in treatment plan below Functional status assessment: Patient has had a recent decline in their functional status and demonstrates the ability to make significant improvements in function in a reasonable and predictable amount of time. Treatment frequency: Min 2x/week Treatment duration: 2 weeks Interventions: Patient/family education; Diet toleration management by SLP Recommendations Recommendations for follow up therapy are one component of a  multi-disciplinary discharge planning process, led by the attending physician.  Recommendations may be updated based on patient status, additional functional criteria and insurance authorization. Assessment: Orofacial Exam: Orofacial Exam Oral Cavity: Oral Hygiene: WFL Oral Cavity - Dentition: Missing dentition Anatomy: Anatomy: Suspected cervical osteophytes Boluses Administered: Boluses Administered Boluses Administered: Thin liquids (Level 0); Mildly thick liquids (Level 2, nectar thick); Moderately thick liquids (Level 3, honey thick); Puree  Oral Impairment Domain: Oral Impairment Domain Lip Closure: Interlabial escape, no progression to anterior lip Tongue control during bolus hold: Not tested Bolus preparation/mastication: Slow prolonged chewing/mashing with complete recollection Bolus transport/lingual motion: Repetitive/disorganized tongue motion Oral residue: Residue collection on oral structures Location of oral residue : Tongue; Floor of mouth Initiation of pharyngeal swallow : Pyriform sinuses  Pharyngeal Impairment Domain: Pharyngeal Impairment Domain Soft palate elevation: No bolus between soft palate (SP)/pharyngeal wall (PW) Laryngeal elevation: Partial superior movement of thyroid cartilage/partial approximation of arytenoids to epiglottic petiole Anterior hyoid excursion: Partial anterior movement Epiglottic movement: Partial inversion Laryngeal vestibule closure: Incomplete, narrow column air/contrast in laryngeal vestibule Pharyngeal stripping wave : Present - diminished Pharyngoesophageal segment opening: Partial distention/partial duration, partial obstruction of flow Tongue base retraction: Narrow column of contrast or air between tongue base and PPW Pharyngeal residue: Collection of residue within or on pharyngeal structures Location of pharyngeal residue: Valleculae; Pharyngeal wall; Pyriform sinuses  Esophageal Impairment Domain: Esophageal Impairment Domain Esophageal clearance upright  position: Esophageal retention (minimal) Pill: Pill Consistency administered: -- (NT) Penetration/Aspiration Scale Score: Penetration/Aspiration Scale Score 8.  Material enters airway, passes BELOW cords without attempt by patient to eject out (silent aspiration) : Thin liquids (Level 0); Mildly thick liquids (Level 2, nectar thick); Moderately thick liquids (Level 3, honey thick); Puree Compensatory Strategies: Compensatory Strategies Compensatory strategies: Yes Straw: Ineffective   General Information: Caregiver present: No  Diet Prior to this Study: NPO   No data recorded  No data recorded  No data recorded  No data recorded Behavior/Cognition: Alert; Doesn't follow directions Self-Feeding Abilities: Dependent for feeding Baseline vocal quality/speech: Normal Volitional Cough: Unable to elicit Volitional Swallow: Unable to elicit Exam Limitations: Poor positioning (head extension and turning during the study) Goal Planning: Prognosis for improved oropharyngeal function: Fair No data recorded  No data recorded Patient/Family Stated Goal: unable to state No data recorded Pain: Pain Assessment Pain Assessment: Faces Faces Pain Scale: 0 Breathing: 0 Negative Vocalization: 0 Facial Expression: 0 Body Language: 0 Consolability: 0 PAINAD Score: 0 End of Session: Start Time:SLP Start Time (ACUTE ONLY): 1032 Stop Time: SLP Stop Time (ACUTE ONLY): 1101 Time Calculation:SLP Time Calculation (min) (ACUTE ONLY): 29 min Charges: SLP Evaluations $ SLP Speech Visit: 1 Visit SLP Evaluations $Swallowing Treatment: 1 Procedure SLP visit diagnosis: SLP Visit Diagnosis: Dysphagia, oropharyngeal phase (R13.12) Past Medical History: Past Medical History: Diagnosis Date  Anxiety   new dx  Chronic kidney disease   Diabetes mellitus   type ii  History of radiation therapy 09/29/13- 11/25/13  prostate 7800 cGy 40 sessions, seminal vesicles 5600 cGy 40 sessions  Hypertension   Prostate cancer (HCC) 06/01/2013  gleason 7 Past Surgical  History: Past Surgical History: Procedure Laterality Date  CARDIAC CATHETERIZATION  01/01/2013  FRACTURE SURGERY    left foot   HIP ARTHROPLASTY Left 11/16/2023  Procedure: ARTHROPLASTY BIPOLAR HIP (HEMIARTHROPLASTY);  Surgeon: Sheral Apley, MD;  Location: [facility] OR;  Service: Orthopedics;  Laterality: Left;  IR GASTROSTOMY TUBE MOD SED  12/23/2023  LEFT HEART CATHETERIZATION WITH CORONARY ANGIOGRAM N/A 01/01/2013  Procedure: LEFT HEART CATHETERIZATION WITH CORONARY ANGIOGRAM;  Surgeon: Robynn Pane, MD;  Location: [facility] CATH LAB;  Service: Cardiovascular;  Laterality: N/A;  PROSTATE BIOPSY  06/01/13 [name] 12/25/2023, 3:34 PM Amanda L. [name], MA CCC/SLP Clinical Specialist - Acute Care SLP Acute Rehabilitation Services Office number [phone]  DG CHEST PORT 1 VIEW Result Date: 12/24/2023 CLINICAL DATA:  Shortness of breath. EXAM: PORTABLE CHEST 1 VIEW COMPARISON:  12/23/2023 FINDINGS: Enteric tube has been removed. Stable lung volumes. Stable heart size and mediastinal contours. Mild peribronchial thickening with mild atelectasis in the lung bases. No pneumothorax or pleural effusion. 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 IMPRESSION: Mild peribronchial thickening with mild atelectasis in the lung bases. Electronically Signed   By: Narda Rutherford M.D.   On: 12/24/2023 10:30   IR GASTROSTOMY TUBE MOD SED Result Date: 12/23/2023 INDICATION: Subarachnoid hemorrhage, dysphagia EXAM: FLUOROSCOPIC 20 FRENCH PULL-THROUGH GASTROSTOMY Date:  12/23/2023 12/23/2023 2:48 pm Radiologist:  Judie Petit. Ruel Favors, MD Guidance:  Fluoroscopic MEDICATIONS: Ancef 2 g; Antibiotics were administered within 1 hour of the procedure. Glucagon 0.5 mg IV ANESTHESIA/SEDATION: Versed 2.0 mg IV; Fentanyl 100 mcg IV Moderate Sedation Time:  11 minutes The patient was continuously monitored during the procedure by the interventional radiology nurse under my direct supervision. CONTRAST:  20mL OMNIPAQUE IOHEXOL 300 MG/ML  SOLN - administered into the gastric lumen. FLUOROSCOPY: Fluoroscopy Time: 6 minutes 0 seconds (100 mGy). COMPLICATIONS: None immediate. PROCEDURE: Informed consent was obtained from the patient following explanation of the procedure, risks, benefits and alternatives. The patient understands, agrees and consents for the procedure. All questions were addressed. A time out was performed. Maximal barrier sterile technique utilized including caps, mask, sterile gowns, sterile gloves, large sterile drape, hand hygiene, and betadine prep. The left upper quadrant was sterilely prepped and draped. An oral gastric catheter was inserted into the stomach under fluoroscopy. The existing nasogastric feeding tube was removed. Air was injected into the stomach for insufflation and visualization under fluoroscopy. The air distended stomach was confirmed beneath the anterior abdominal wall in the frontal and lateral projections. Under sterile conditions and local anesthesia, a 17 gauge trocar needle was utilized to access the stomach percutaneously beneath the left subcostal margin. Needle position was  confirmed within the stomach under biplane fluoroscopy. Contrast injection confirmed position also. A single T tack was deployed for gastropexy. Over an Amplatz guide wire, a 9-French sheath was inserted into the stomach. A snare device was utilized to capture the oral gastric catheter. The snare device was pulled retrograde from the stomach up the esophagus and out the oropharynx. The 20-French pull-through gastrostomy was connected to the snare device and pulled antegrade through the oropharynx down the esophagus into the stomach and then through the percutaneous tract external to the patient. The gastrostomy was assembled externally. Contrast injection confirms position in the stomach. Images were obtained for documentation. The patient tolerated procedure well. No immediate complication. IMPRESSION: Fluoroscopic insertion of a  20-French "pull-through" gastrostomy. Electronically Signed   By: Judie Petit.  Shick M.D.   On: 12/23/2023 14:58   DG CHEST PORT 1 VIEW Result Date: 12/23/2023 CLINICAL DATA:  Shortness of breath EXAM: PORTABLE CHEST 1 VIEW COMPARISON:  Film from the previous day. FINDINGS: Cardiac shadow is within normal limits. The lungs are clear bilaterally. Feeding catheter extends into the stomach. IMPRESSION: No active disease. Electronically Signed   By: Alcide Clever M.D.   On: 12/23/2023 11:01   DG Abd Portable 1V Result Date: 12/23/2023 CLINICAL DATA:  Check colonic contrast EXAM: PORTABLE ABDOMEN - 1 VIEW COMPARISON:  12/18/2023 FINDINGS: Contrast is noted throughout the colon. Feeding catheter is noted within the stomach. Some partial overlap of the stomach with the transverse colon is noted similar to that seen on prior CT. IMPRESSION: Transverse colon and gastric lumen in close proximity. Electronically Signed   By: Alcide Clever M.D.   On: 12/23/2023 11:00   DG CHEST PORT 1 VIEW Addendum Date: 12/22/2023 ADDENDUM REPORT: 12/22/2023 08:57 ADDENDUM: These results were called by telephone at the time of interpretation on 12/22/2023 at 8:56 am to provider [facility] , who verbally acknowledged these results. Electronically Signed   By: Signa Kell M.D.   On: 12/22/2023 08:57   Result Date: 12/22/2023 CLINICAL DATA:  Shortness of breath EXAM: PORTABLE CHEST 1 VIEW COMPARISON:  12/21/2023 FINDINGS: The enteric tube tip is now within the mid mediastinum in the projection of the distal left mainstem bronchus. This is similar in position to the previous exam. Heart size is normal. Aortic atherosclerotic calcifications. No pleural fluid, interstitial edema or airspace disease. Scar versus atelectasis within the periphery of the left lower lobe. IMPRESSION: 1. Enteric tube tip is now within the mid mediastinum in the projection of the distal left mainstem bronchus. This is similar in position to the previous exam. Recommend  repositioning. 2. No acute cardiopulmonary abnormalities. Electronically Signed: By: Signa Kell M.D. On: 12/22/2023 08:49   DG CHEST PORT 1 VIEW Result Date: 12/21/2023 CLINICAL DATA:  Shortness of breath EXAM: PORTABLE CHEST 1 VIEW COMPARISON:  X-ray 12/14/2023 FINDINGS: Feeding tube is seen overlying the mid mediastinum. This has been retracted. No pneumothorax, effusion or edema. Normal cardiopericardial silhouette. Calcified aorta. Improving lung base opacity. Overlapping cardiac leads. IMPRESSION: Retraction of feeding tube now with tip along the mid mediastinum. Critical Value/emergent results were called by telephone at the time of interpretation on 12/21/2023 at 9:38 am to provider [facility] , who verbally acknowledged these results. Electronically Signed   By: Karen Kays M.D.   On: 12/21/2023 09:44   CT ABDOMEN WO CONTRAST Result Date: 12/19/2023 CLINICAL DATA:  dysphagia EXAM: CT ABDOMEN WITHOUT CONTRAST TECHNIQUE: Multidetector CT imaging of the abdomen was performed following the standard protocol without  IV contrast. RADIATION DOSE REDUCTION: This exam was performed according to the departmental dose-optimization program which includes automated exposure control, adjustment of the mA and/or kV according to patient size and/or use of iterative reconstruction technique. COMPARISON:  CT AP, 06/29/2013.  CHEST XR 12/14/2018. FINDINGS: Lower chest: Calcified coronary atherosclerosis. Minimal patchy opacities of the imaged lung bases, greater on the RIGHT. Hepatobiliary: Normal noncontrast appearance of the liver without focal abnormality. No gallstones, gallbladder wall thickening, or biliary dilatation. Pancreas: No pancreatic ductal dilatation or surrounding inflammatory changes. Spleen: Normal in size without focal abnormality. Small infrahilar accessory spleen. Adrenals/Urinary Tract: Adrenal glands are unremarkable. Small, sub-1 cm rounded renal cortical hyperdensities measuring 60 HU,  incompletely characterized though likely to represent small proteinaceous cysts. Kidneys are otherwise normal, without renal calculi or hydronephrosis. Stomach/Bowel: Stomach is within normal limits. Imaged portions of the small bowel and colon are nondilated. Appendix is outside the field of view. No evidence of bowel wall thickening, distention, or inflammatory changes. Vascular/Lymphatic: Aortic atherosclerosis. No enlarged abdominal lymph nodes. Other: No abdominal wall hernia or ascites. Musculoskeletal: Chronic-appearing L2 compression deformity with 50% loss of height. No acute osseous findings. IMPRESSION: 1. No acute abdominal process. 2. Mild prominence of the LEFT hepatic lobe and interposition of distal transverse and splenic flexure of colon. Anatomy is equivocal for percutaneous gastrostomy, with a trial contrast opacification of colon and insufflation recommended. 3. Chronic L2 compression deformity and Aortic Atherosclerosis (ICD10-I70.0). Additional incidental, chronic and senescent findings as above. Roanna Banning, MD [facility] Electronically Signed   By: Roanna Banning M.D.   On: 12/19/2023 07:00    Microbiology: Recent Results (from the past 240 hours)  Resp panel by RT-PCR (RSV, Flu A&B, Covid) Anterior Nasal Swab     Status: None   Collection Time: 01/06/24  3:07 PM   Specimen: Anterior Nasal Swab  Result Value Ref Range Status   SARS Coronavirus 2 by RT PCR NEGATIVE NEGATIVE Final   Influenza A by PCR NEGATIVE NEGATIVE Final   Influenza B by PCR NEGATIVE NEGATIVE Final    Comment: (NOTE) The Xpert Xpress SARS-CoV-2/FLU/RSV plus assay is intended as an aid in the diagnosis of influenza from Nasopharyngeal swab specimens and should not be used as a sole basis for treatment. Nasal washings and aspirates are unacceptable for Xpert Xpress SARS-CoV-2/FLU/RSV testing.  Fact Sheet for  Patients: BloggerCourse.com  Fact Sheet for Healthcare Providers: SeriousBroker.it  This test is not yet approved or cleared by the [place] FDA and has been authorized for detection and/or diagnosis of SARS-CoV-2 by FDA under an Emergency Use Authorization (EUA). This EUA will remain in effect (meaning this test can be used) for the duration of the COVID-19 declaration under Section 564(b)(1) of the Act, 21 U.S.C. section 360bbb-3(b)(1), unless the authorization is terminated or revoked.     Resp Syncytial Virus by PCR NEGATIVE NEGATIVE Final    Comment: (NOTE) Fact Sheet for Patients: BloggerCourse.com  Fact Sheet for Healthcare Providers: SeriousBroker.it  This test is not yet approved or cleared by the [place] FDA and has been authorized for detection and/or diagnosis of SARS-CoV-2 by FDA under an Emergency Use Authorization (EUA). This EUA will remain in effect (meaning this test can be used) for the duration of the COVID-19 declaration under Section 564(b)(1) of the Act, 21 U.S.C. section 360bbb-3(b)(1), unless the authorization is terminated or revoked.  Performed at [facility], [address], [place], [place] [address]   Culture,  blood (routine x 2)     Status: None   Collection Time: 01/07/24 12:18 AM   Specimen: BLOOD LEFT ARM  Result Value Ref Range Status   Specimen Description BLOOD LEFT ARM  Final   Special Requests   Final    BOTTLES DRAWN AEROBIC AND ANAEROBIC Blood Culture adequate volume   Culture   Final    NO GROWTH 5 DAYS Performed at [facility], [address], [place], [place] [address]    Report Status 01/12/2024 FINAL  Final  Culture, blood (routine x 2)     Status: Abnormal   Collection Time: 01/07/24 12:20 AM   Specimen: BLOOD  Result Value Ref Range Status   Specimen Description BLOOD RIGHT ANTECUBITAL  Final    Special Requests   Final    BOTTLES DRAWN AEROBIC AND ANAEROBIC Blood Culture adequate volume   Culture  Setup Time   Final    GRAM POSITIVE COCCI AEROBIC BOTTLE ONLY CRITICAL RESULT CALLED TO, READ BACK BY AND VERIFIED WITH: PHARMD EMILY SINCLAIR ON 01/09/24 @ 1528 BY DRT CORRECTED RESULTS GRAM POSITIVE COCCI AEROBIC BOTTLE ONLY PREVIOUSLY REPORTED AS: GRAM NEGATIVE RODS AEROBIC BOTTLE ONLY CORRECTED RESULTS CALLED TO: PHARMD B.AGEE AT 0848 ON  BY T.SAAD.    Culture (A)  Final    COAGULASE NEGATIVE STAPHYLOCOCCUS THE SIGNIFICANCE OF ISOLATING THIS ORGANISM FROM A SINGLE SET OF BLOOD CULTURES WHEN MULTIPLE SETS ARE DRAWN IS UNCERTAIN. PLEASE NOTIFY THE MICROBIOLOGY DEPARTMENT WITHIN ONE WEEK IF SPECIATION AND SENSITIVITIES ARE REQUIRED. Performed at [facility], [address], [place], [place] [address]    Report Status  FINAL  Final  Blood Culture ID Panel (Reflexed)     Status: Abnormal   Collection Time: 01/07/24 12:20 AM  Result Value Ref Range Status   Enterococcus faecalis NOT DETECTED NOT DETECTED Final   Enterococcus Faecium NOT DETECTED NOT DETECTED Final   Listeria monocytogenes NOT DETECTED NOT DETECTED Final   Staphylococcus species DETECTED (A) NOT DETECTED Final    Comment: CRITICAL RESULT CALLED TO, READ BACK BY AND VERIFIED WITH: PHARMD EMILY SINCLAIR ON 01/09/24 @ 1528 BY DRT    Staphylococcus aureus (BCID) NOT DETECTED NOT DETECTED Final   Staphylococcus epidermidis NOT DETECTED NOT DETECTED Final   Staphylococcus lugdunensis NOT DETECTED NOT DETECTED Final   Streptococcus species NOT DETECTED NOT DETECTED Final   Streptococcus agalactiae NOT DETECTED NOT DETECTED Final   Streptococcus pneumoniae NOT DETECTED NOT DETECTED Final   Streptococcus pyogenes NOT DETECTED NOT DETECTED Final   A.calcoaceticus-baumannii NOT DETECTED NOT DETECTED Final   Bacteroides fragilis NOT DETECTED NOT DETECTED Final   Enterobacterales NOT DETECTED NOT  DETECTED Final   Enterobacter cloacae complex NOT DETECTED NOT DETECTED Final   Escherichia coli NOT DETECTED NOT DETECTED Final   Klebsiella aerogenes NOT DETECTED NOT DETECTED Final   Klebsiella oxytoca NOT DETECTED NOT DETECTED Final   Klebsiella pneumoniae NOT DETECTED NOT DETECTED Final   Proteus species NOT DETECTED NOT DETECTED Final   Salmonella species NOT DETECTED NOT DETECTED Final   Serratia marcescens NOT DETECTED NOT DETECTED Final   Haemophilus influenzae NOT DETECTED NOT DETECTED Final   Neisseria meningitidis NOT DETECTED NOT DETECTED Final   Pseudomonas aeruginosa NOT DETECTED NOT DETECTED Final   Stenotrophomonas maltophilia NOT DETECTED NOT DETECTED Final   Candida albicans NOT DETECTED NOT DETECTED Final   Candida auris NOT DETECTED NOT DETECTED Final   Candida glabrata NOT DETECTED NOT DETECTED Final   Candida krusei  NOT DETECTED NOT DETECTED Final   Candida parapsilosis NOT DETECTED NOT DETECTED Final   Candida tropicalis NOT DETECTED NOT DETECTED Final   Cryptococcus neoformans/gattii NOT DETECTED NOT DETECTED Final    Comment: Performed at [facility], [address] [place], [place] [address]  Culture, blood (Routine X 2) w Reflex to ID Panel     Status: None   Collection Time: 01/09/24  9:50 PM   Specimen: BLOOD LEFT ARM  Result Value Ref Range Status   Specimen Description BLOOD LEFT ARM  Final   Special Requests   Final    BOTTLES DRAWN AEROBIC AND ANAEROBIC Blood Culture adequate volume   Culture   Final    NO GROWTH 5 DAYS Performed at [facility], [address] [place], [place] [address]    Report Status  FINAL  Final  Culture, blood (Routine X 2) w Reflex to ID Panel     Status: None   Collection Time: 01/09/24 10:02 PM   Specimen: BLOOD LEFT HAND  Result Value Ref Range Status   Specimen Description BLOOD LEFT HAND  Final   Special Requests   Final    BOTTLES DRAWN AEROBIC AND ANAEROBIC Blood Culture adequate volume    Culture   Final    NO GROWTH 5 DAYS Performed at [facility], [address] [place], [place] [address]    Report Status  FINAL  Final    Time spent: 35 minutes  Signed: Jeoffrey Massed, MD 01/15/24

## 2024-02-12 NOTE — Progress Notes (Signed)
 Palliative:  Discussed with Dr. Jerral Ralph. Came by Mr. [name] bedside but no family present. He has more agonal pattern breathes at this time. Appears to be progressing at end of life but appears mostly comfortable.   Returned to bedside and family present at bedside along with RN and Dr. Jerral Ralph. He is actively dying. Left bedside to provide family privacy to spend with him in his last moments. Notified by RN that William Todd has died - family requesting privacy and no further visits from providers. Order placed for RN to pronounce to respect family wishes.   No charge  Yong Channel, NP Palliative Medicine Team Pager [phone] (Please see [email or web address] for schedule) Team Phone [phone]

## 2024-02-12 NOTE — Plan of Care (Signed)

## 2024-02-12 NOTE — Progress Notes (Signed)
    1343  Vitals  Pulse Rate (!) 48  ECG Heart Rate (!) 50  Resp (!) 29  MEWS COLOR  MEWS Score Color Red  Oxygen Therapy  SpO2 (!) 78 %  O2 Device Non-rebreather Mask  O2 Flow Rate (L/min) 15 L/min  MEWS Score  MEWS Temp 0  MEWS Systolic 1  MEWS Pulse 1  MEWS RR 2  MEWS LOC 3  MEWS Score 7   Wife and family at bedside. Per wife, Dr. Jerral Ralph is coming to bedside to talk with them.

## 2024-02-12 NOTE — Progress Notes (Addendum)
 PROGRESS NOTE        PATIENT DETAILS Name: COLONEL KRAUSER Age: 73 y.o. Sex: male Date of Birth: 18-May-1951 Admit Date: 01/06/2024 Admitting Physician Dewayne Shorter Levora Dredge, MD [id number], Modena Slater, MD  Brief Summary: Patient is a 73 y.o.  male with history of recent hospitalization for ICH (2/15 to 3/20)-discharged to SNF-presented with fever and tremors.  Significant events: 3/25>> admit to [facility]-fever/tremors. 3/28>> agitated-gurgling at times-tachypneic-given IV Ativan-with significant improvement-much more calmer.  CXR with basilar infiltrates bilaterally.Suspicion for aspiration with agitation.  Antibiotics broadened to cefepime. 3/29>> overnight O2 saturations dropped to 85-more left-sided infiltrates on repeat CXR.  Vancomycin started. 4/01>> worsening hypoxia-on 6 L of HFNC earlier this morning-worsened further-now on 100% NRB.  Significant studies: 3/25>> CT head: Decrease size/density of left frontal ICH-decreasing SAH 3/25>> CXR: No PNA. 3/28>> CXR: Bibasilar interstitial opacities. 3/29>> CXR: Left mid/lower lobe consolidation 4/01>> CXR: Interval worsening of lung aeration-right greater than left infiltrates.  Significant microbiology data: 3/25>> COVID/influenza/RSV PCR: Negative 3/26>> blood culture: Coagulase-negative staph (prior report of gram-negative rod-not seen) 3/28>> blood culture: No growth.   Procedures: None  Consults: Palliative care  Subjective: Continues to do poorly--required additional doses of morphine/Ativan overnight due to air hunger/tachypnea/restlessness.  On nonrebreather mask-tachypneic-uncomfortable today.  Poorly responsive.  No family at bedside this morning.  Objective: Vitals: Blood pressure (!) 106/49, pulse 89, temperature 99.3 F (37.4 C), resp. rate (!) 32, height 6' (1.829 m), weight 65.3 kg, SpO2 (!) 87%.   Exam: Gen Exam: Poorly responsive-coughing at times-gurgling at times.  Tachypneic-in some  discomfort. HEENT:atraumatic, normocephalic Chest: Significant transmitted upper airway sounds. CVS:S1S2 regular Abdomen:soft non tender, non distended Extremities:no edema Neurology: Non focal Skin: no rash  Pertinent Labs/Radiology:    Latest Ref Rng & Units     7:38 AM 01/13/2024    4:47 AM 01/12/2024    4:15 AM  CBC  WBC 4.0 - 10.5 K/uL 17.6  13.4  10.7   Hemoglobin 13.0 - 17.0 g/dL 7.8  8.1  7.0   Hematocrit 39.0 - 52.0 % 26.3  28.3  23.7   Platelets 150 - 400 K/uL 158  178  152     Lab Results  Component Value Date   NA 139    K 5.6 (H)    CL 108    CO2 26       Assessment/Plan: Sepsis secondary to aspiration pneumonia Acute hypoxic respiratory failure secondary to aspiration pneumonia Unfortunately continues to worsen-tachypneic this morning.  Required several more doses of Ativan/morphine for comfort. He is " gurgling "- accumulating secretions-uncomfortable and very poorly responsive On 100% nonrebreather mask Remains on cefepime-but doubt antibiotics at this point will help-as he is overtly aspirating. I have had numerous discussions with spouse over the past week-she has been resistant to transitioning to comfort measures-she is well aware of the fact that patient is at this point actively dying-and is clearly at end-of-life.  I had a chance to speak with patient's daughter on 4/1-all aware of tenuous/critical state.  DNR remains in place. Palliative care following Will await arrival of family and continue to engage them.  At this point-best served by initiation of full comfort measures as it is highly likely he will not survive this hospitalization. Continue strict suppression precautions/frequent suctioning/Robinul/n.p.o. status.  Acute hypoxic respiratory failure Worsening hypoxemia-see above-now on 100% NRB.  Acute metabolic encephalopathy  Secondary to worsening hypoxemia/aspiration pneumonia-in the setting of  recent large ICH  Supportive care.  Hypernatremia Improved with free water via PEG tube.  Hyperkalemia Secondary to worsening renal function Lokelma x 1  AKI CKD stage IIIa Creatinine worsening Continue PEG tube feeds Free water Avoid nephrotoxic agent Follow electrolytes.   Tremor Appears to be intentional-no tremors at rest Likely a physiologic response to febrile illness/recent hospitalization and worsens when he is in pain/discomfort and has fever. Suspect this is worsened when he has fever or is in pain/discomfort. Recently TSH on 2/15 stable. EEG-no seizures Neurology was curbsided by ED-recommendations were to switch metoprolol to propranolol. Need outpatient neurology evaluation if tremors continue after he recovers from his acute illness.  Normocytic anemia Secondary to acute illness Hb slowly downtrending-probably will require a PRBC transfusion if significant drop. Follow closely  Recent left frontal ICH/SAH with cerebral edema CT showing interval improvement Continue supportive care  Dysphagia-oropharyngeal Secondary to recent ICH Unfortunately continues to aspirate in spite of strict aspiration precaution/n.p.o. status See above discussion Continue tube feeds-keep NPO.  DM-2 CBGs fluctuating Continue Semglee 12 units Moderate scale SSI Not a candidate for aggressive glycemic control    Recent Labs     0321  0828  0900  GLUCAP 141* 254* 248*     HTN BP stable on midodrine Continuing propranolol-mostly for tremors. No longer on amlodipine  HLD Statin  Severe calorie malnutrition Tube feeds Nutrition evaluation appreciated.  Bilateral conjunctivitis Cipro eyedrops for a few days.  History of prostate cancer Outpatient follow-up with urology/oncology.   Deep Tissue Pressure Injury L heel Unstageable PI L lateral foot  Stage 2 Pressure Injury L hip Unstageable PI sacrum  POA Wound care per wound care  RN.  Palliative care Please see prior notes Very difficult situation-DNR in place but patient overtly aspirating and clearly deteriorating-now on 100% nonrebreather mask.  Best served by initiation of comfort measures.  Family has been resistant to transition to comfort measures.  Family/spouse aware that patient is very close to end-of-life-and that he is best served by comfort measures.  Family has been agreeable to use as needed Ativan/morphine for comfort. Palliative care continues to follow Will await arrival of family members today.  Code status:   Code Status: Limited: Do not attempt resuscitation (DNR) -DNR-LIMITED -Do Not Intubate/DNI    DVT Prophylaxis: enoxaparin (LOVENOX) injection 40 mg Start: 01/07/24 1000  Family Communication:  Left voicemail for spouse-[phone] on 4/2 Addendum: Called spouse again a few minutes ago-left voicemail. Addendum-called spouse and talked to her she is in the hospital now.  Addendum Spouse / Daughter/nephew at bedside-we discussed transitioning to full comfort measures-difference between morphine infusion and morphine pushes for symptom control.  Explained why we use narcotics/benzodiazepines in the setting-primarily to relieve air hunger-and suffering.  Spouse-conveyed that using a morphine infusion may accelerate his dying process-I explained that we use morphine/ativan to alleviate symptoms/pain/suffering associated with the dying process.  This is the first time that had spoken to the patient's daughter at length (had only spoken very briefly yesterday)-and she did convey to me that I had been too blunt and had not shown enough empathy over the past several days.  I did apologize if that was the case-but explained that my intentions were to make sure everyone understood how tenuous/critical the situation was.  We subsequently had a very good conversation-at the end of conversation-the daughter in fact give me Hug.   While I left the  room-the nurse  informed me that patient had a prolonged apneic spell-started brady'ing down.   I went back to the room-examined the patient- and explained to family that patient might pass away in the next few minutes.  The bedside nurse, Yong Channel NP with palliative and myself left the family with the patient.  I was subsequently notified that the patient did pass away at 1412 hrs.   Disposition Plan: Status is: Observation The patient will require care spanning > 2 midnights and should be moved to inpatient because: Severity of illness   Planned Discharge Destination:Skilled nursing facility   Diet: Diet Order     None         Antimicrobial agents: Anti-infectives (From admission, onward)    Start     Dose/Rate Route Frequency Ordered Stop   01/11/24 1330  vancomycin (VANCOREADY) IVPB 500 mg/100 mL        500 mg 100 mL/hr over 60 Minutes Intravenous  Once 01/11/24 1230 01/11/24 1512   01/11/24 0300  vancomycin (VANCOREADY) IVPB 750 mg/150 mL  Status:  Discontinued        750 mg 150 mL/hr over 60 Minutes Intravenous Every 24 hours 01/10/24 1158 01/12/24 1109   01/10/24 0300  vancomycin (VANCOCIN) IVPB 1000 mg/200 mL premix  Status:  Discontinued        1,000 mg 200 mL/hr over 60 Minutes Intravenous Every 24 hours 01/10/24 0155 01/10/24 1158   01/09/24 1700  ceFEPIme (MAXIPIME) 2 g in sodium chloride 0.9 % 100 mL IVPB        2 g 200 mL/hr over 30 Minutes Intravenous Every 12 hours 01/09/24 1608 01/16/24 2359   01/09/24 1000  vancomycin (VANCOCIN) IVPB 1000 mg/200 mL premix  Status:  Discontinued        1,000 mg 200 mL/hr over 60 Minutes Intravenous Every 48 hours 01/07/24 0140 01/07/24 0858   01/09/24 0900  ceFAZolin (ANCEF) IVPB 2g/100 mL premix       Note to Pharmacy: Give in IR for surgical prophylaxis   2 g 200 mL/hr over 30 Minutes Intravenous To Radiology 01/09/24 0803 01/09/24 1137   01/08/24 0500  vancomycin (VANCOCIN) IVPB 1000 mg/200 mL premix  Status:   Discontinued        1,000 mg 200 mL/hr over 60 Minutes Intravenous Every 24 hours 01/07/24 0858 01/07/24 1144   01/07/24 1300  Ampicillin-Sulbactam (UNASYN) 3 g in sodium chloride 0.9 % 100 mL IVPB  Status:  Discontinued        3 g 200 mL/hr over 30 Minutes Intravenous Every 6 hours 01/07/24 1212 01/09/24 1608   01/07/24 0230  metroNIDAZOLE (FLAGYL) IVPB 500 mg  Status:  Discontinued        500 mg 100 mL/hr over 60 Minutes Intravenous 2 times daily 01/07/24 0121 01/07/24 1154   01/07/24 0230  vancomycin (VANCOREADY) IVPB 1250 mg/250 mL        1,250 mg 166.7 mL/hr over 90 Minutes Intravenous  Once 01/07/24 0121 01/07/24 0638   01/07/24 0200  ceFEPIme (MAXIPIME) 2 g in sodium chloride 0.9 % 100 mL IVPB  Status:  Discontinued        2 g 200 mL/hr over 30 Minutes Intravenous 2 times daily 01/07/24 0121 01/07/24 1154        MEDICATIONS: Scheduled Meds:  acetylcysteine  3 mL Nebulization BID   amantadine  100 mg Per Tube BID   atorvastatin  40 mg Per Tube q1800   ciprofloxacin  1 drop Both Eyes Q4H while  awake   enoxaparin (LOVENOX) injection  40 mg Subcutaneous Daily   famotidine  20 mg Per Tube BID   feeding supplement (PROSource TF20)  60 mL Per Tube Daily   fiber supplement (BANATROL TF)  60 mL Per Tube BID   free water  400 mL Per Tube Q6H   Gerhardt's butt cream   Topical BID   glycopyrrolate  0.1 mg Intravenous TID   insulin aspart  0-15 Units Subcutaneous Q4H   insulin glargine-yfgn  12 Units Subcutaneous QHS   leptospermum manuka honey  1 Application Topical Daily   midodrine  10 mg Per Tube TID WC   multivitamin with minerals  1 tablet Per Tube Daily   polyethylene glycol  17 g Per Tube Daily   propranolol  40 mg Per Tube BID   senna-docusate  2 tablet Per Tube QHS   Continuous Infusions:  ceFEPime (MAXIPIME) IV 2 g ( 0412)   feeding supplement (OSMOLITE 1.5 CAL) 1,000 mL (01/13/24 1700)   PRN Meds:.acetaminophen, bisacodyl, levalbuterol, LORazepam,  metoprolol tartrate, morphine injection, ondansetron **OR** ondansetron (ZOFRAN) IV   I have personally reviewed following labs and imaging studies  LABORATORY DATA: CBC: Recent Labs  Lab 01/10/24 0436 01/11/24 0600 01/12/24 0415 01/13/24 0447  0738  WBC 13.0* 12.1* 10.7* 13.4* 17.6*  NEUTROABS 11.6*  --   --   --   --   HGB 7.2* 7.3* 7.0* 8.1* 7.8*  HCT 23.7* 24.9* 23.7* 28.3* 26.3*  MCV 96.7 99.6 99.2 99.6 101.5*  PLT 144* 157 152 178 158    Basic Metabolic Panel: Recent Labs  Lab 01/08/24 0748 01/09/24 0612 01/10/24 0436 01/11/24 0600 01/12/24 0415 01/13/24 0447  0428  0738  NA 150*   < > 146* 143 141 141  --  139  K 3.5   < > 3.9 4.1 4.4 4.8  --  5.6*  CL 114*   < > 113* 111 109 106  --  108  CO2 25   < > 27 24 18* 22  --  26  GLUCOSE 103*   < > 171* 239* 339* 330*  --  274*  BUN 18   < > 41* 41* 37* 40*  --  52*  CREATININE 1.36*   < > 1.73* 1.36* 1.16 1.63* 1.63* 1.66*  CALCIUM 9.5   < > 8.3* 8.4* 8.4* 8.7*  --  7.8*  MG 1.8  --   --   --   --   --   --   --   PHOS 2.7  --   --   --   --   --   --   --    < > = values in this interval not displayed.    GFR: Estimated Creatinine Clearance: 36.6 mL/min (A) (by C-G formula based on SCr of 1.66 mg/dL (H)).  Liver Function Tests: Recent Labs  Lab 01/08/24 0748 01/11/24 0600  0738  AST 36 41 64*  ALT 57* 33 51*  ALKPHOS 139* 104 117  BILITOT 0.7 0.5 0.7  PROT 6.6 5.3* 5.6*  ALBUMIN 2.5* 1.9* 1.8*   No results for input(s): "LIPASE", "AMYLASE" in the last 168 hours. No results for input(s): "AMMONIA" in the last 168 hours.   Coagulation Profile: No results for input(s): "INR", "PROTIME" in the last 168 hours.  Cardiac Enzymes: No results for input(s): "CKTOTAL", "CKMB", "CKMBINDEX", "TROPONINI" in the last 168 hours.  BNP (last 3 results) No results for input(s): "PROBNP" in the  last 8760 hours.  Lipid Profile: No results for input(s): "CHOL", "HDL", "LDLCALC",  "TRIG", "CHOLHDL", "LDLDIRECT" in the last 72 hours.  Thyroid Function Tests: No results for input(s): "TSH", "T4TOTAL", "FREET4", "T3FREE", "THYROIDAB" in the last 72 hours.  Anemia Panel: No results for input(s): "VITAMINB12", "FOLATE", "FERRITIN", "TIBC", "IRON", "RETICCTPCT" in the last 72 hours.  Urine analysis:    Component Value Date/Time   COLORURINE YELLOW 01/06/2024 2340   APPEARANCEUR HAZY (A) 01/06/2024 2340   LABSPEC 1.010 01/06/2024 2340   PHURINE 5.0 01/06/2024 2340   GLUCOSEU >=500 (A) 01/06/2024 2340   HGBUR SMALL (A) 01/06/2024 2340   BILIRUBINUR NEGATIVE 01/06/2024 2340   KETONESUR NEGATIVE 01/06/2024 2340   PROTEINUR 30 (A) 01/06/2024 2340   NITRITE NEGATIVE 01/06/2024 2340   LEUKOCYTESUR NEGATIVE 01/06/2024 2340    Sepsis Labs: Lactic Acid, Venous    Component Value Date/Time   LATICACIDVEN 2.5 (HH) 01/07/2024 0025    MICROBIOLOGY: Recent Results (from the past 240 hours)  Resp panel by RT-PCR (RSV, Flu A&B, Covid) Anterior Nasal Swab     Status: None   Collection Time: 01/06/24  3:07 PM   Specimen: Anterior Nasal Swab  Result Value Ref Range Status   SARS Coronavirus 2 by RT PCR NEGATIVE NEGATIVE Final   Influenza A by PCR NEGATIVE NEGATIVE Final   Influenza B by PCR NEGATIVE NEGATIVE Final    Comment: (NOTE) The Xpert Xpress SARS-CoV-2/FLU/RSV plus assay is intended as an aid in the diagnosis of influenza from Nasopharyngeal swab specimens and should not be used as a sole basis for treatment. Nasal washings and aspirates are unacceptable for Xpert Xpress SARS-CoV-2/FLU/RSV testing.  Fact Sheet for Patients: BloggerCourse.com  Fact Sheet for Healthcare Providers: SeriousBroker.it  This test is not yet approved or cleared by the [place] FDA and has been authorized for detection and/or diagnosis of SARS-CoV-2 by FDA under an Emergency Use Authorization (EUA). This EUA will remain in  effect (meaning this test can be used) for the duration of the COVID-19 declaration under Section 564(b)(1) of the Act, 21 U.S.C. section 360bbb-3(b)(1), unless the authorization is terminated or revoked.     Resp Syncytial Virus by PCR NEGATIVE NEGATIVE Final    Comment: (NOTE) Fact Sheet for Patients: BloggerCourse.com  Fact Sheet for Healthcare Providers: SeriousBroker.it  This test is not yet approved or cleared by the [place] FDA and has been authorized for detection and/or diagnosis of SARS-CoV-2 by FDA under an Emergency Use Authorization (EUA). This EUA will remain in effect (meaning this test can be used) for the duration of the COVID-19 declaration under Section 564(b)(1) of the Act, 21 U.S.C. section 360bbb-3(b)(1), unless the authorization is terminated or revoked.  Performed at [facility], [address] [place], [place] [address]   Culture, blood (routine x 2)     Status: None   Collection Time: 01/07/24 12:18 AM   Specimen: BLOOD LEFT ARM  Result Value Ref Range Status   Specimen Description BLOOD LEFT ARM  Final   Special Requests   Final    BOTTLES DRAWN AEROBIC AND ANAEROBIC Blood Culture adequate volume   Culture   Final    NO GROWTH 5 DAYS Performed at [facility], [address] [place], [place] [address]    Report Status 01/12/2024 FINAL  Final  Culture, blood (routine x 2)     Status: Abnormal   Collection Time: 01/07/24 12:20 AM   Specimen: BLOOD  Result Value Ref Range Status  Specimen Description BLOOD RIGHT ANTECUBITAL  Final   Special Requests   Final    BOTTLES DRAWN AEROBIC AND ANAEROBIC Blood Culture adequate volume   Culture  Setup Time   Final    GRAM POSITIVE COCCI AEROBIC BOTTLE ONLY CRITICAL RESULT CALLED TO, READ BACK BY AND VERIFIED WITH: PHARMD EMILY SINCLAIR ON 01/09/24 @ 1528 BY DRT CORRECTED RESULTS GRAM POSITIVE COCCI AEROBIC BOTTLE ONLY PREVIOUSLY  REPORTED AS: GRAM NEGATIVE RODS AEROBIC BOTTLE ONLY CORRECTED RESULTS CALLED TO: PHARMD B.AGEE AT 0848 ON  BY T.SAAD.    Culture (A)  Final    COAGULASE NEGATIVE STAPHYLOCOCCUS THE SIGNIFICANCE OF ISOLATING THIS ORGANISM FROM A SINGLE SET OF BLOOD CULTURES WHEN MULTIPLE SETS ARE DRAWN IS UNCERTAIN. PLEASE NOTIFY THE MICROBIOLOGY DEPARTMENT WITHIN ONE WEEK IF SPECIATION AND SENSITIVITIES ARE REQUIRED. Performed at [facility], [address], [place], [place] [address]    Report Status  FINAL  Final  Blood Culture ID Panel (Reflexed)     Status: Abnormal   Collection Time: 01/07/24 12:20 AM  Result Value Ref Range Status   Enterococcus faecalis NOT DETECTED NOT DETECTED Final   Enterococcus Faecium NOT DETECTED NOT DETECTED Final   Listeria monocytogenes NOT DETECTED NOT DETECTED Final   Staphylococcus species DETECTED (A) NOT DETECTED Final    Comment: CRITICAL RESULT CALLED TO, READ BACK BY AND VERIFIED WITH: PHARMD EMILY SINCLAIR ON 01/09/24 @ 1528 BY DRT    Staphylococcus aureus (BCID) NOT DETECTED NOT DETECTED Final   Staphylococcus epidermidis NOT DETECTED NOT DETECTED Final   Staphylococcus lugdunensis NOT DETECTED NOT DETECTED Final   Streptococcus species NOT DETECTED NOT DETECTED Final   Streptococcus agalactiae NOT DETECTED NOT DETECTED Final   Streptococcus pneumoniae NOT DETECTED NOT DETECTED Final   Streptococcus pyogenes NOT DETECTED NOT DETECTED Final   A.calcoaceticus-baumannii NOT DETECTED NOT DETECTED Final   Bacteroides fragilis NOT DETECTED NOT DETECTED Final   Enterobacterales NOT DETECTED NOT DETECTED Final   Enterobacter cloacae complex NOT DETECTED NOT DETECTED Final   Escherichia coli NOT DETECTED NOT DETECTED Final   Klebsiella aerogenes NOT DETECTED NOT DETECTED Final   Klebsiella oxytoca NOT DETECTED NOT DETECTED Final   Klebsiella pneumoniae NOT DETECTED NOT DETECTED Final   Proteus species NOT DETECTED NOT DETECTED Final    Salmonella species NOT DETECTED NOT DETECTED Final   Serratia marcescens NOT DETECTED NOT DETECTED Final   Haemophilus influenzae NOT DETECTED NOT DETECTED Final   Neisseria meningitidis NOT DETECTED NOT DETECTED Final   Pseudomonas aeruginosa NOT DETECTED NOT DETECTED Final   Stenotrophomonas maltophilia NOT DETECTED NOT DETECTED Final   Candida albicans NOT DETECTED NOT DETECTED Final   Candida auris NOT DETECTED NOT DETECTED Final   Candida glabrata NOT DETECTED NOT DETECTED Final   Candida krusei NOT DETECTED NOT DETECTED Final   Candida parapsilosis NOT DETECTED NOT DETECTED Final   Candida tropicalis NOT DETECTED NOT DETECTED Final   Cryptococcus neoformans/gattii NOT DETECTED NOT DETECTED Final    Comment: Performed at [facility], [address] [place], [place] [address]  Culture, blood (Routine X 2) w Reflex to ID Panel     Status: None   Collection Time: 01/09/24  9:50 PM   Specimen: BLOOD LEFT ARM  Result Value Ref Range Status   Specimen Description BLOOD LEFT ARM  Final   Special Requests   Final    BOTTLES DRAWN AEROBIC AND ANAEROBIC Blood Culture adequate volume   Culture   Final  NO GROWTH 5 DAYS Performed at [facility], [address], [place], [place] [address]    Report Status  FINAL  Final  Culture, blood (Routine X 2) w Reflex to ID Panel     Status: None   Collection Time: 01/09/24 10:02 PM   Specimen: BLOOD LEFT HAND  Result Value Ref Range Status   Specimen Description BLOOD LEFT HAND  Final   Special Requests   Final    BOTTLES DRAWN AEROBIC AND ANAEROBIC Blood Culture adequate volume   Culture   Final    NO GROWTH 5 DAYS Performed at [facility], [address] [place], [place] [address]    Report Status  FINAL  Final    RADIOLOGY STUDIES/RESULTS: DG Chest Port 1V same Day Result Date:  CLINICAL DATA:  Short of breath. EXAM: PORTABLE CHEST 1 VIEW COMPARISON:  01/13/2024 and older studies.  FINDINGS: Cardiac silhouette is normal in size. No mediastinal masses. Perihilar opacity, right greater than left, with more diffuse interstitial thickening. Opacity at the left lung base obscures the left hemidiaphragm consistent with layering pleural fluid. No pneumothorax. IMPRESSION: 1. Interval worsening of lung aeration. Right greater than left perihilar lung opacities associated more diffuse interstitial thickening. IR insert with a pulmonary edema versus multifocal infection. Persistent left pleural effusion. Electronically Signed   By: Amie Portland M.D.   On:  07:33   DG Chest Port 1V same Day Result Date: 01/13/2024 CLINICAL DATA:  Short of breath EXAM: PORTABLE CHEST 1 VIEW COMPARISON:  01/12/2024 FINDINGS: Low lung volumes. Increase in RIGHT basilar atelectasis. Unchanged LEFT pleural effusion. Upper lungs are clear. No pneumothorax. Gas-filled colon. IMPRESSION: 1. Low lung volumes with increased RIGHT basilar atelectasis. 2. Unchanged LEFT pleural effusion. Electronically Signed   By: Genevive Bi M.D.   On: 01/13/2024 10:30      LOS: 7 days   Jeoffrey Massed, MD  [facility]    To contact the attending provider between 7A-7P or the covering provider during after hours 7P-7A, please log into the web site [email or web address] and access using universal [place] password for that web site. If you do not have the password, please call the hospital operator.  , 11:42 AM

## 2024-02-12 NOTE — Progress Notes (Addendum)
 Nutrition Follow-up  DOCUMENTATION CODES:   Severe malnutrition in context of chronic illness  INTERVENTION:  Continue tube feeding via PEG Osmolite 1.5 at 50 ml/h (1200 ml per day) Prosource TF20 60 ml daily Free Water Flushes Q6 hours Provides 1880 kcal, 95 gm protein, free water (TF+flush)  Continue MVI w/ minerals daily Discontinue Banatrol BID d/t multiple days without BM Collect new weight  NUTRITION DIAGNOSIS:  Severe Malnutrition related to chronic illness (T2DM, cognitive impairment, hx prostate CA, short term memory issues questionable for dementia) as evidenced by severe fat depletion, severe muscle depletion. - remains applicable  GOAL:  Patient will meet greater than or equal to 90% of their needs - meeting with TFs running at goal rate  MONITOR:  TF tolerance, Labs, Skin  REASON FOR ASSESSMENT:  Consult Assessment of nutrition requirement/status, Enteral/tube feeding initiation and management  ASSESSMENT:   Pt discharged almost one week ago. Staff at [facility] reported increased tremors as well as fever. Found to have sepsis likely 2/2 aspiration PNA. PMH: CAD, HLD, T2DM, HTN, anxiety, and prostate cancer (radiation 09/29/13-11/25/13). No formal dx of dementia, however with poor short term memory.  Previous Admission 2/16 admitted to [facility] w/ ICH with bilateral [facility] 2/17 Cortrak placed/TF initiated/ pt pulled out Cortrak 2/19 Cortrak replaced/TF re-initiated/EEG 2/25 transferred out of ICU  3/7 - SLP continues to recommend NPO 3/10 - cortrak replaced 3/11 - PEG placed in IR 3/20 - discharged to [facility] (SNF) Current Admission 3/25 admitted, CT head: decrease size/density of left frontal ICH - decreasing SAH; CXR: no PNA 3/26: TF re-initiated via PEG 3/28: CXR: bibasilar interstitial opacities 3/29: CXR: more L sided infiltrates 04/01: CXR: interval worsening of lung aeration  Pt continues to actively decline. Required more morphine/Ativan  overnight d/t air hunger/tachypnea/restlessness. He is minimally responsive with episodes of agitation. Requiring nonrebreather mask. Continues to aspirate secretions. IV ABX in place, however suspect poor efficacy.   Per MD, would benefit from full comfort measures. Wife is not amicable to this currently. TF regimen continues at goal rate and patient tolerating.   Admit/Current Weight: 65.3kg ? pulled forward from previous admission (five days ago)   Weight stable during last two admissions between 65-66kg. No edema noted. Needs new weight. Will enter order to obtain as able.   Drains/Lines: LUQ: PEG tube (20Fr) UOP: x24 hours  Meds: famotidine, Banatrol, SSI 0-15 Q4, Semglee 12 QD, MVI, Miralax, senna-docusate, IV ABX   Labs: Na+ 139 (wdl) K+ 5.6 (H) Hgb 7.0>8.1>708 (L) CBGs 274-330 x24 hours A1c 6.1 (11/2023)   Diet Order:   Diet Order     None    EDUCATION NEEDS:   Not appropriate for education at this time  Skin:  Skin Assessment: Skin Integrity Issues: Skin Integrity Issues:: DTI, Stage II, Stage III DTI: mid coccyx Stage II: sacrum and L hip Stage III: L knee  Last BM:  3/31 - will D/C Banatrol   Height:  Ht Readings from Last 1 Encounters:  01/06/24 6' (1.829 m)   Weight:  Wt Readings from Last 1 Encounters:  01/06/24 65.3 kg   Ideal Body Weight:  80.9 kg  BMI:  Body mass index is 19.52 kg/m.  Estimated Nutritional Needs:   Kcal:  1800-2000kcal  Protein:  90-100g  Fluid:  >1.8L/day  Myrtie Cruise MS, RD, LDN Registered Dietitian Clinical Nutrition RD Inpatient Contact Info in Amion

## 2024-02-12 DEATH — deceased
